# Patient Record
Sex: Male | Born: 1935 | Race: White | Hispanic: No | Marital: Married | Smoking: Former smoker
Health system: Southern US, Community
[De-identification: ages and names within clinical notes are randomized; demographics above are authoritative.]

## PROBLEM LIST (undated history)

## (undated) DIAGNOSIS — I2510 Atherosclerotic heart disease of native coronary artery without angina pectoris: Secondary | ICD-10-CM

## (undated) DIAGNOSIS — K219 Gastro-esophageal reflux disease without esophagitis: Secondary | ICD-10-CM

## (undated) DIAGNOSIS — R972 Elevated prostate specific antigen [PSA]: Secondary | ICD-10-CM

## (undated) DIAGNOSIS — R001 Bradycardia, unspecified: Secondary | ICD-10-CM

## (undated) DIAGNOSIS — I472 Ventricular tachycardia, unspecified: Secondary | ICD-10-CM

## (undated) DIAGNOSIS — I5032 Chronic diastolic (congestive) heart failure: Secondary | ICD-10-CM

## (undated) DIAGNOSIS — E8809 Other disorders of plasma-protein metabolism, not elsewhere classified: Secondary | ICD-10-CM

## (undated) DIAGNOSIS — E119 Type 2 diabetes mellitus without complications: Secondary | ICD-10-CM

## (undated) DIAGNOSIS — I469 Cardiac arrest, cause unspecified: Secondary | ICD-10-CM

## (undated) DIAGNOSIS — J189 Pneumonia, unspecified organism: Secondary | ICD-10-CM

## (undated) DIAGNOSIS — R0902 Hypoxemia: Secondary | ICD-10-CM

## (undated) DIAGNOSIS — I38 Endocarditis, valve unspecified: Secondary | ICD-10-CM

## (undated) DIAGNOSIS — G4733 Obstructive sleep apnea (adult) (pediatric): Secondary | ICD-10-CM

## (undated) DIAGNOSIS — C3490 Malignant neoplasm of unspecified part of unspecified bronchus or lung: Secondary | ICD-10-CM

## (undated) DIAGNOSIS — K449 Diaphragmatic hernia without obstruction or gangrene: Secondary | ICD-10-CM

## (undated) DIAGNOSIS — N189 Chronic kidney disease, unspecified: Secondary | ICD-10-CM

## (undated) DIAGNOSIS — B49 Unspecified mycosis: Secondary | ICD-10-CM

## (undated) DIAGNOSIS — I495 Sick sinus syndrome: Secondary | ICD-10-CM

## (undated) DIAGNOSIS — F32A Depression, unspecified: Secondary | ICD-10-CM

## (undated) DIAGNOSIS — F329 Major depressive disorder, single episode, unspecified: Secondary | ICD-10-CM

## (undated) DIAGNOSIS — K222 Esophageal obstruction: Secondary | ICD-10-CM

## (undated) DIAGNOSIS — F1010 Alcohol abuse, uncomplicated: Secondary | ICD-10-CM

## (undated) DIAGNOSIS — K2210 Ulcer of esophagus without bleeding: Secondary | ICD-10-CM

## (undated) DIAGNOSIS — J449 Chronic obstructive pulmonary disease, unspecified: Secondary | ICD-10-CM

## (undated) DIAGNOSIS — R042 Hemoptysis: Secondary | ICD-10-CM

## (undated) DIAGNOSIS — I480 Paroxysmal atrial fibrillation: Secondary | ICD-10-CM

## (undated) DIAGNOSIS — I4729 Other ventricular tachycardia: Secondary | ICD-10-CM

## (undated) DIAGNOSIS — I10 Essential (primary) hypertension: Secondary | ICD-10-CM

## (undated) DIAGNOSIS — C44311 Basal cell carcinoma of skin of nose: Secondary | ICD-10-CM

## (undated) HISTORY — PX: OTHER SURGICAL HISTORY: SHX169

## (undated) HISTORY — DX: Major depressive disorder, single episode, unspecified: F32.9

## (undated) HISTORY — DX: Chronic kidney disease, unspecified: N18.9

## (undated) HISTORY — DX: Esophageal obstruction: K22.2

## (undated) HISTORY — DX: Chronic obstructive pulmonary disease, unspecified: J44.9

## (undated) HISTORY — DX: Hypoxemia: R09.02

## (undated) HISTORY — PX: KNEE ARTHROSCOPY: SHX127

## (undated) HISTORY — DX: Elevated prostate specific antigen (PSA): R97.20

## (undated) HISTORY — DX: Gastro-esophageal reflux disease without esophagitis: K21.9

## (undated) HISTORY — PX: TONSILLECTOMY: SUR1361

## (undated) HISTORY — DX: Obstructive sleep apnea (adult) (pediatric): G47.33

## (undated) HISTORY — DX: Depression, unspecified: F32.A

## (undated) HISTORY — DX: Basal cell carcinoma of skin of nose: C44.311

## (undated) HISTORY — DX: Essential (primary) hypertension: I10

## (undated) HISTORY — DX: Type 2 diabetes mellitus without complications: E11.9

## (undated) HISTORY — PX: CARDIAC CATHETERIZATION: SHX172

## (undated) HISTORY — PX: HERNIA REPAIR: SHX51

## (undated) HISTORY — PX: LUNG LOBECTOMY: SHX167

---

## 1997-06-28 ENCOUNTER — Inpatient Hospital Stay (HOSPITAL_COMMUNITY): Admission: EM | Admit: 1997-06-28 | Discharge: 1997-07-02 | Payer: Self-pay | Admitting: Emergency Medicine

## 1997-07-05 ENCOUNTER — Ambulatory Visit (HOSPITAL_BASED_OUTPATIENT_CLINIC_OR_DEPARTMENT_OTHER): Admission: RE | Admit: 1997-07-05 | Discharge: 1997-07-05 | Payer: Self-pay | Admitting: Orthopedic Surgery

## 1997-08-10 ENCOUNTER — Other Ambulatory Visit: Admission: RE | Admit: 1997-08-10 | Discharge: 1997-08-10 | Payer: Self-pay | Admitting: Orthopedic Surgery

## 2000-10-27 ENCOUNTER — Encounter: Admission: RE | Admit: 2000-10-27 | Discharge: 2000-10-27 | Payer: Self-pay

## 2000-12-22 ENCOUNTER — Ambulatory Visit (HOSPITAL_BASED_OUTPATIENT_CLINIC_OR_DEPARTMENT_OTHER): Admission: RE | Admit: 2000-12-22 | Discharge: 2000-12-22 | Payer: Self-pay

## 2001-01-09 ENCOUNTER — Encounter: Admission: RE | Admit: 2001-01-09 | Discharge: 2001-01-09 | Payer: Self-pay

## 2001-03-26 ENCOUNTER — Observation Stay (HOSPITAL_COMMUNITY): Admission: RE | Admit: 2001-03-26 | Discharge: 2001-03-27 | Payer: Self-pay | Admitting: Urology

## 2001-06-10 ENCOUNTER — Ambulatory Visit (HOSPITAL_COMMUNITY): Admission: RE | Admit: 2001-06-10 | Discharge: 2001-06-10 | Payer: Self-pay | Admitting: Orthopedic Surgery

## 2004-03-07 ENCOUNTER — Ambulatory Visit: Payer: Self-pay | Admitting: Oncology

## 2004-09-12 ENCOUNTER — Encounter: Admission: RE | Admit: 2004-09-12 | Discharge: 2004-09-12 | Payer: Self-pay | Admitting: Family Medicine

## 2004-09-26 ENCOUNTER — Ambulatory Visit: Payer: Self-pay | Admitting: Oncology

## 2004-09-26 ENCOUNTER — Encounter: Admission: RE | Admit: 2004-09-26 | Discharge: 2004-09-26 | Payer: Self-pay | Admitting: Family Medicine

## 2004-09-27 ENCOUNTER — Ambulatory Visit: Payer: Self-pay | Admitting: Oncology

## 2004-09-28 ENCOUNTER — Encounter: Admission: RE | Admit: 2004-09-28 | Discharge: 2004-09-28 | Payer: Self-pay | Admitting: Oncology

## 2004-10-01 ENCOUNTER — Ambulatory Visit (HOSPITAL_COMMUNITY): Admission: RE | Admit: 2004-10-01 | Discharge: 2004-10-01 | Payer: Self-pay | Admitting: Oncology

## 2004-10-02 ENCOUNTER — Ambulatory Visit: Payer: Self-pay | Admitting: Internal Medicine

## 2004-10-03 ENCOUNTER — Ambulatory Visit: Payer: Self-pay | Admitting: Internal Medicine

## 2004-10-23 ENCOUNTER — Ambulatory Visit (HOSPITAL_COMMUNITY): Admission: RE | Admit: 2004-10-23 | Discharge: 2004-10-23 | Payer: Self-pay | Admitting: Thoracic Surgery

## 2004-10-23 ENCOUNTER — Encounter (INDEPENDENT_AMBULATORY_CARE_PROVIDER_SITE_OTHER): Payer: Self-pay | Admitting: *Deleted

## 2004-10-26 ENCOUNTER — Inpatient Hospital Stay (HOSPITAL_BASED_OUTPATIENT_CLINIC_OR_DEPARTMENT_OTHER): Admission: RE | Admit: 2004-10-26 | Discharge: 2004-10-26 | Payer: Self-pay | Admitting: *Deleted

## 2004-10-30 ENCOUNTER — Encounter (INDEPENDENT_AMBULATORY_CARE_PROVIDER_SITE_OTHER): Payer: Self-pay | Admitting: *Deleted

## 2004-10-30 ENCOUNTER — Inpatient Hospital Stay (HOSPITAL_COMMUNITY): Admission: RE | Admit: 2004-10-30 | Discharge: 2004-11-11 | Payer: Self-pay | Admitting: Thoracic Surgery

## 2004-10-30 ENCOUNTER — Ambulatory Visit: Payer: Self-pay | Admitting: Emergency Medicine

## 2004-11-06 ENCOUNTER — Ambulatory Visit: Payer: Self-pay | Admitting: Oncology

## 2004-11-20 ENCOUNTER — Encounter: Admission: RE | Admit: 2004-11-20 | Discharge: 2004-11-20 | Payer: Self-pay | Admitting: Thoracic Surgery

## 2004-11-22 ENCOUNTER — Ambulatory Visit: Payer: Self-pay | Admitting: Oncology

## 2004-11-30 ENCOUNTER — Ambulatory Visit (HOSPITAL_COMMUNITY): Admission: RE | Admit: 2004-11-30 | Discharge: 2004-11-30 | Payer: Self-pay | Admitting: Thoracic Surgery

## 2004-12-04 ENCOUNTER — Encounter: Admission: RE | Admit: 2004-12-04 | Discharge: 2004-12-04 | Payer: Self-pay | Admitting: Thoracic Surgery

## 2004-12-06 ENCOUNTER — Ambulatory Visit: Payer: Self-pay | Admitting: Internal Medicine

## 2004-12-07 ENCOUNTER — Ambulatory Visit: Payer: Self-pay | Admitting: Internal Medicine

## 2004-12-07 ENCOUNTER — Ambulatory Visit (HOSPITAL_COMMUNITY): Admission: RE | Admit: 2004-12-07 | Discharge: 2004-12-07 | Payer: Self-pay | Admitting: Internal Medicine

## 2004-12-07 ENCOUNTER — Encounter (INDEPENDENT_AMBULATORY_CARE_PROVIDER_SITE_OTHER): Payer: Self-pay | Admitting: *Deleted

## 2004-12-07 DIAGNOSIS — K222 Esophageal obstruction: Secondary | ICD-10-CM

## 2004-12-07 HISTORY — DX: Esophageal obstruction: K22.2

## 2004-12-26 ENCOUNTER — Ambulatory Visit: Payer: Self-pay | Admitting: Internal Medicine

## 2004-12-26 ENCOUNTER — Encounter: Admission: RE | Admit: 2004-12-26 | Discharge: 2004-12-26 | Payer: Self-pay | Admitting: Thoracic Surgery

## 2005-01-07 ENCOUNTER — Ambulatory Visit: Payer: Self-pay | Admitting: Oncology

## 2005-01-14 ENCOUNTER — Ambulatory Visit: Payer: Self-pay | Admitting: Pulmonary Disease

## 2005-02-26 ENCOUNTER — Encounter: Admission: RE | Admit: 2005-02-26 | Discharge: 2005-02-26 | Payer: Self-pay | Admitting: Thoracic Surgery

## 2005-02-26 ENCOUNTER — Ambulatory Visit: Payer: Self-pay | Admitting: Pulmonary Disease

## 2005-03-14 ENCOUNTER — Ambulatory Visit (HOSPITAL_COMMUNITY): Admission: RE | Admit: 2005-03-14 | Discharge: 2005-03-14 | Payer: Self-pay | Admitting: Oncology

## 2005-03-15 ENCOUNTER — Ambulatory Visit: Payer: Self-pay | Admitting: Oncology

## 2005-04-23 ENCOUNTER — Ambulatory Visit: Payer: Self-pay | Admitting: Pulmonary Disease

## 2005-05-31 ENCOUNTER — Ambulatory Visit: Payer: Self-pay | Admitting: Oncology

## 2005-06-05 ENCOUNTER — Ambulatory Visit (HOSPITAL_COMMUNITY): Admission: RE | Admit: 2005-06-05 | Discharge: 2005-06-05 | Payer: Self-pay | Admitting: Oncology

## 2005-07-17 ENCOUNTER — Encounter: Admission: RE | Admit: 2005-07-17 | Discharge: 2005-07-17 | Payer: Self-pay | Admitting: Thoracic Surgery

## 2005-07-31 ENCOUNTER — Ambulatory Visit: Payer: Self-pay | Admitting: Pulmonary Disease

## 2005-09-11 ENCOUNTER — Ambulatory Visit: Payer: Self-pay | Admitting: Pulmonary Disease

## 2005-11-20 ENCOUNTER — Encounter: Admission: RE | Admit: 2005-11-20 | Discharge: 2005-11-20 | Payer: Self-pay | Admitting: Thoracic Surgery

## 2005-11-29 ENCOUNTER — Ambulatory Visit: Payer: Self-pay | Admitting: Oncology

## 2005-12-02 LAB — CBC WITH DIFFERENTIAL (CANCER CENTER ONLY)
Eosinophils Absolute: 0.2 10*3/uL (ref 0.0–0.5)
HCT: 39.5 % (ref 38.7–49.9)
LYMPH#: 3.3 10*3/uL (ref 0.9–3.3)
LYMPH%: 23.1 % (ref 14.0–48.0)
MCV: 92 fL (ref 82–98)
MONO#: 1.2 10*3/uL — ABNORMAL HIGH (ref 0.1–0.9)
NEUT%: 66.8 % (ref 40.0–80.0)
RBC: 4.29 10*6/uL (ref 4.20–5.70)
WBC: 14.3 10*3/uL — ABNORMAL HIGH (ref 4.0–10.0)

## 2005-12-03 LAB — COMPREHENSIVE METABOLIC PANEL
CO2: 22 mEq/L (ref 19–32)
Calcium: 9.5 mg/dL (ref 8.4–10.5)
Chloride: 96 mEq/L (ref 96–112)
Creatinine, Ser: 1.3 mg/dL (ref 0.40–1.50)
Glucose, Bld: 169 mg/dL — ABNORMAL HIGH (ref 70–99)
Total Bilirubin: 0.6 mg/dL (ref 0.3–1.2)
Total Protein: 7.1 g/dL (ref 6.0–8.3)

## 2005-12-04 ENCOUNTER — Ambulatory Visit (HOSPITAL_COMMUNITY): Admission: RE | Admit: 2005-12-04 | Discharge: 2005-12-04 | Payer: Self-pay | Admitting: Oncology

## 2005-12-23 ENCOUNTER — Ambulatory Visit: Payer: Self-pay | Admitting: Pulmonary Disease

## 2006-03-26 ENCOUNTER — Ambulatory Visit: Payer: Self-pay | Admitting: Oncology

## 2006-04-01 LAB — COMPREHENSIVE METABOLIC PANEL
Albumin: 4.1 g/dL (ref 3.5–5.2)
CO2: 21 mEq/L (ref 19–32)
Calcium: 9.1 mg/dL (ref 8.4–10.5)
Glucose, Bld: 181 mg/dL — ABNORMAL HIGH (ref 70–99)
Potassium: 4.6 mEq/L (ref 3.5–5.3)
Sodium: 134 mEq/L — ABNORMAL LOW (ref 135–145)
Total Bilirubin: 0.6 mg/dL (ref 0.3–1.2)
Total Protein: 6.6 g/dL (ref 6.0–8.3)

## 2006-04-01 LAB — CBC WITH DIFFERENTIAL (CANCER CENTER ONLY)
BASO#: 0.1 10*3/uL (ref 0.0–0.2)
BASO%: 0.7 % (ref 0.0–2.0)
HCT: 39.9 % (ref 38.7–49.9)
HGB: 13.6 g/dL (ref 13.0–17.1)
LYMPH#: 2.3 10*3/uL (ref 0.9–3.3)
LYMPH%: 17.4 % (ref 14.0–48.0)
MCHC: 34.2 g/dL (ref 32.0–35.9)
MCV: 94 fL (ref 82–98)
MONO#: 1.1 10*3/uL — ABNORMAL HIGH (ref 0.1–0.9)
NEUT%: 72.7 % (ref 40.0–80.0)
RDW: 12.2 % (ref 10.5–14.6)
WBC: 13.2 10*3/uL — ABNORMAL HIGH (ref 4.0–10.0)

## 2006-04-01 LAB — LACTATE DEHYDROGENASE: LDH: 150 U/L (ref 94–250)

## 2006-04-08 ENCOUNTER — Ambulatory Visit (HOSPITAL_COMMUNITY): Admission: RE | Admit: 2006-04-08 | Discharge: 2006-04-08 | Payer: Self-pay | Admitting: Oncology

## 2006-05-15 ENCOUNTER — Ambulatory Visit: Payer: Self-pay | Admitting: Pulmonary Disease

## 2006-05-21 ENCOUNTER — Encounter: Admission: RE | Admit: 2006-05-21 | Discharge: 2006-05-21 | Payer: Self-pay | Admitting: Thoracic Surgery

## 2006-05-21 ENCOUNTER — Ambulatory Visit: Payer: Self-pay | Admitting: Thoracic Surgery

## 2006-06-23 ENCOUNTER — Ambulatory Visit: Payer: Self-pay | Admitting: Internal Medicine

## 2006-06-30 ENCOUNTER — Encounter (INDEPENDENT_AMBULATORY_CARE_PROVIDER_SITE_OTHER): Payer: Self-pay | Admitting: *Deleted

## 2006-06-30 ENCOUNTER — Ambulatory Visit: Payer: Self-pay | Admitting: Internal Medicine

## 2006-07-16 ENCOUNTER — Ambulatory Visit: Payer: Self-pay | Admitting: Pulmonary Disease

## 2006-08-04 ENCOUNTER — Ambulatory Visit: Payer: Self-pay | Admitting: Oncology

## 2006-08-05 LAB — CBC WITH DIFFERENTIAL (CANCER CENTER ONLY)
BASO#: 0.1 10*3/uL (ref 0.0–0.2)
BASO%: 0.4 % (ref 0.0–2.0)
EOS%: 0.8 % (ref 0.0–7.0)
Eosinophils Absolute: 0.1 10*3/uL (ref 0.0–0.5)
HCT: 39.2 % (ref 38.7–49.9)
HGB: 13.3 g/dL (ref 13.0–17.1)
LYMPH#: 1.5 10*3/uL (ref 0.9–3.3)
LYMPH%: 11.9 % — ABNORMAL LOW (ref 14.0–48.0)
MCH: 31.2 pg (ref 28.0–33.4)
MCHC: 33.9 g/dL (ref 32.0–35.9)
MCV: 92 fL (ref 82–98)
MONO#: 0.6 10*3/uL (ref 0.1–0.9)
MONO%: 4.5 % (ref 0.0–13.0)
NEUT#: 10.2 10*3/uL — ABNORMAL HIGH (ref 1.5–6.5)
NEUT%: 82.4 % — ABNORMAL HIGH (ref 40.0–80.0)
Platelets: 362 10*3/uL (ref 145–400)
RBC: 4.26 10*6/uL (ref 4.20–5.70)
RDW: 13.5 % (ref 10.5–14.6)
WBC: 12.3 10*3/uL — ABNORMAL HIGH (ref 4.0–10.0)

## 2006-08-05 LAB — COMPREHENSIVE METABOLIC PANEL
ALT: 26 U/L (ref 0–53)
Alkaline Phosphatase: 72 U/L (ref 39–117)
Sodium: 130 mEq/L — ABNORMAL LOW (ref 135–145)
Total Bilirubin: 1 mg/dL (ref 0.3–1.2)
Total Protein: 6.2 g/dL (ref 6.0–8.3)

## 2006-08-07 ENCOUNTER — Ambulatory Visit (HOSPITAL_COMMUNITY): Admission: RE | Admit: 2006-08-07 | Discharge: 2006-08-07 | Payer: Self-pay | Admitting: Oncology

## 2006-10-03 ENCOUNTER — Ambulatory Visit: Payer: Self-pay | Admitting: Oncology

## 2006-10-15 ENCOUNTER — Ambulatory Visit: Payer: Self-pay | Admitting: Emergency Medicine

## 2006-11-19 ENCOUNTER — Encounter: Admission: RE | Admit: 2006-11-19 | Discharge: 2006-11-19 | Payer: Self-pay | Admitting: Thoracic Surgery

## 2006-11-19 ENCOUNTER — Ambulatory Visit: Payer: Self-pay | Admitting: Thoracic Surgery

## 2006-12-01 ENCOUNTER — Ambulatory Visit: Payer: Self-pay | Admitting: Oncology

## 2006-12-11 ENCOUNTER — Inpatient Hospital Stay (HOSPITAL_COMMUNITY): Admission: EM | Admit: 2006-12-11 | Discharge: 2006-12-29 | Payer: Self-pay | Admitting: Emergency Medicine

## 2006-12-11 ENCOUNTER — Ambulatory Visit: Payer: Self-pay | Admitting: Pulmonary Disease

## 2006-12-12 ENCOUNTER — Encounter (INDEPENDENT_AMBULATORY_CARE_PROVIDER_SITE_OTHER): Payer: Self-pay | Admitting: *Deleted

## 2007-01-30 ENCOUNTER — Ambulatory Visit: Payer: Self-pay | Admitting: Emergency Medicine

## 2007-02-02 ENCOUNTER — Ambulatory Visit: Payer: Self-pay | Admitting: Oncology

## 2007-02-03 LAB — CBC WITH DIFFERENTIAL (CANCER CENTER ONLY)
BASO#: 0.1 10*3/uL (ref 0.0–0.2)
BASO%: 0.9 % (ref 0.0–2.0)
EOS%: 2.1 % (ref 0.0–7.0)
HGB: 10.7 g/dL — ABNORMAL LOW (ref 13.0–17.1)
LYMPH#: 6 10*3/uL — ABNORMAL HIGH (ref 0.9–3.3)
MCH: 29.5 pg (ref 28.0–33.4)
MCHC: 33.8 g/dL (ref 32.0–35.9)
MONO%: 10.5 % (ref 0.0–13.0)
NEUT#: 5.9 10*3/uL (ref 1.5–6.5)
Platelets: 488 10*3/uL — ABNORMAL HIGH (ref 145–400)
RDW: 13.7 % (ref 10.5–14.6)

## 2007-02-03 LAB — COMPREHENSIVE METABOLIC PANEL
CO2: 23 mEq/L (ref 19–32)
Calcium: 8.6 mg/dL (ref 8.4–10.5)
Creatinine, Ser: 1.09 mg/dL (ref 0.40–1.50)
Glucose, Bld: 139 mg/dL — ABNORMAL HIGH (ref 70–99)
Total Bilirubin: 0.2 mg/dL — ABNORMAL LOW (ref 0.3–1.2)
Total Protein: 6.2 g/dL (ref 6.0–8.3)

## 2007-02-03 LAB — LACTATE DEHYDROGENASE: LDH: 153 U/L (ref 94–250)

## 2007-02-05 ENCOUNTER — Ambulatory Visit (HOSPITAL_COMMUNITY): Admission: RE | Admit: 2007-02-05 | Discharge: 2007-02-05 | Payer: Self-pay | Admitting: Oncology

## 2007-02-10 LAB — IRON AND TIBC
%SAT: 7 % — ABNORMAL LOW (ref 20–55)
Iron: 30 ug/dL — ABNORMAL LOW (ref 42–165)
TIBC: 443 ug/dL — ABNORMAL HIGH (ref 215–435)

## 2007-02-17 ENCOUNTER — Telehealth (INDEPENDENT_AMBULATORY_CARE_PROVIDER_SITE_OTHER): Payer: Self-pay | Admitting: *Deleted

## 2007-02-18 ENCOUNTER — Ambulatory Visit: Payer: Self-pay | Admitting: Thoracic Surgery

## 2007-03-04 ENCOUNTER — Ambulatory Visit: Payer: Self-pay | Admitting: Emergency Medicine

## 2007-03-04 DIAGNOSIS — C3490 Malignant neoplasm of unspecified part of unspecified bronchus or lung: Secondary | ICD-10-CM | POA: Insufficient documentation

## 2007-03-04 DIAGNOSIS — R0902 Hypoxemia: Secondary | ICD-10-CM | POA: Insufficient documentation

## 2007-03-04 DIAGNOSIS — K219 Gastro-esophageal reflux disease without esophagitis: Secondary | ICD-10-CM

## 2007-03-04 DIAGNOSIS — I2510 Atherosclerotic heart disease of native coronary artery without angina pectoris: Secondary | ICD-10-CM | POA: Insufficient documentation

## 2007-03-04 DIAGNOSIS — J449 Chronic obstructive pulmonary disease, unspecified: Secondary | ICD-10-CM

## 2007-03-04 DIAGNOSIS — J4489 Other specified chronic obstructive pulmonary disease: Secondary | ICD-10-CM | POA: Insufficient documentation

## 2007-03-04 DIAGNOSIS — G4710 Hypersomnia, unspecified: Secondary | ICD-10-CM | POA: Insufficient documentation

## 2007-03-04 DIAGNOSIS — G4730 Sleep apnea, unspecified: Secondary | ICD-10-CM

## 2007-03-19 DIAGNOSIS — I469 Cardiac arrest, cause unspecified: Secondary | ICD-10-CM

## 2007-03-19 HISTORY — DX: Cardiac arrest, cause unspecified: I46.9

## 2007-04-14 ENCOUNTER — Telehealth (INDEPENDENT_AMBULATORY_CARE_PROVIDER_SITE_OTHER): Payer: Self-pay | Admitting: *Deleted

## 2007-05-11 ENCOUNTER — Telehealth (INDEPENDENT_AMBULATORY_CARE_PROVIDER_SITE_OTHER): Payer: Self-pay | Admitting: *Deleted

## 2007-06-15 ENCOUNTER — Telehealth (INDEPENDENT_AMBULATORY_CARE_PROVIDER_SITE_OTHER): Payer: Self-pay | Admitting: *Deleted

## 2007-08-04 ENCOUNTER — Ambulatory Visit: Payer: Self-pay | Admitting: Oncology

## 2007-08-07 ENCOUNTER — Ambulatory Visit (HOSPITAL_COMMUNITY): Admission: RE | Admit: 2007-08-07 | Discharge: 2007-08-07 | Payer: Self-pay | Admitting: Oncology

## 2007-08-11 ENCOUNTER — Ambulatory Visit: Payer: Self-pay | Admitting: Thoracic Surgery

## 2007-08-11 ENCOUNTER — Encounter: Payer: Self-pay | Admitting: Internal Medicine

## 2007-09-04 ENCOUNTER — Ambulatory Visit: Payer: Self-pay | Admitting: Emergency Medicine

## 2007-12-18 ENCOUNTER — Ambulatory Visit: Payer: Self-pay | Admitting: Emergency Medicine

## 2008-02-04 ENCOUNTER — Ambulatory Visit: Payer: Self-pay | Admitting: Oncology

## 2008-02-09 LAB — CBC WITH DIFFERENTIAL (CANCER CENTER ONLY)
BASO%: 1.1 % (ref 0.0–2.0)
Eosinophils Absolute: 0.3 10*3/uL (ref 0.0–0.5)
LYMPH#: 4.4 10*3/uL — ABNORMAL HIGH (ref 0.9–3.3)
MONO#: 0.9 10*3/uL (ref 0.1–0.9)
NEUT#: 7.5 10*3/uL — ABNORMAL HIGH (ref 1.5–6.5)
Platelets: 350 10*3/uL (ref 145–400)
RBC: 4.24 10*6/uL (ref 4.20–5.70)
WBC: 13.3 10*3/uL — ABNORMAL HIGH (ref 4.0–10.0)

## 2008-02-09 LAB — CMP (CANCER CENTER ONLY)
ALT(SGPT): 20 U/L (ref 10–47)
AST: 28 U/L (ref 11–38)
Creat: 1.5 mg/dl — ABNORMAL HIGH (ref 0.6–1.2)
Total Bilirubin: 0.6 mg/dl (ref 0.20–1.60)

## 2008-02-10 ENCOUNTER — Ambulatory Visit (HOSPITAL_COMMUNITY): Admission: RE | Admit: 2008-02-10 | Discharge: 2008-02-10 | Payer: Self-pay | Admitting: Oncology

## 2008-02-15 ENCOUNTER — Encounter: Payer: Self-pay | Admitting: Internal Medicine

## 2008-02-17 ENCOUNTER — Ambulatory Visit: Payer: Self-pay | Admitting: Thoracic Surgery

## 2008-04-04 ENCOUNTER — Telehealth (INDEPENDENT_AMBULATORY_CARE_PROVIDER_SITE_OTHER): Payer: Self-pay | Admitting: *Deleted

## 2008-04-11 ENCOUNTER — Encounter: Admission: RE | Admit: 2008-04-11 | Discharge: 2008-04-11 | Payer: Self-pay | Admitting: *Deleted

## 2008-04-13 ENCOUNTER — Telehealth (INDEPENDENT_AMBULATORY_CARE_PROVIDER_SITE_OTHER): Payer: Self-pay | Admitting: *Deleted

## 2008-04-26 ENCOUNTER — Telehealth (INDEPENDENT_AMBULATORY_CARE_PROVIDER_SITE_OTHER): Payer: Self-pay | Admitting: *Deleted

## 2008-05-03 ENCOUNTER — Ambulatory Visit: Payer: Self-pay | Admitting: Emergency Medicine

## 2008-08-09 ENCOUNTER — Ambulatory Visit: Payer: Self-pay | Admitting: Oncology

## 2008-08-10 ENCOUNTER — Encounter: Admission: RE | Admit: 2008-08-10 | Discharge: 2008-08-10 | Payer: Self-pay | Admitting: Oncology

## 2008-08-12 LAB — CMP (CANCER CENTER ONLY)
ALT(SGPT): 23 U/L (ref 10–47)
Albumin: 3.4 g/dL (ref 3.3–5.5)
CO2: 30 mEq/L (ref 18–33)
Calcium: 9.1 mg/dL (ref 8.0–10.3)
Chloride: 97 mEq/L — ABNORMAL LOW (ref 98–108)
Potassium: 4.2 mEq/L (ref 3.3–4.7)
Sodium: 146 mEq/L — ABNORMAL HIGH (ref 128–145)
Total Protein: 7 g/dL (ref 6.4–8.1)

## 2008-08-12 LAB — CBC WITH DIFFERENTIAL (CANCER CENTER ONLY)
BASO%: 0.8 % (ref 0.0–2.0)
EOS%: 1.8 % (ref 0.0–7.0)
Eosinophils Absolute: 0.3 10*3/uL (ref 0.0–0.5)
MCH: 32.9 pg (ref 28.0–33.4)
MCHC: 35.6 g/dL (ref 32.0–35.9)
MONO%: 7.6 % (ref 0.0–13.0)
NEUT#: 8.7 10*3/uL — ABNORMAL HIGH (ref 1.5–6.5)
Platelets: 348 10*3/uL (ref 145–400)
RBC: 4.16 10*6/uL — ABNORMAL LOW (ref 4.20–5.70)
RDW: 12.3 % (ref 10.5–14.6)

## 2008-08-12 LAB — LACTATE DEHYDROGENASE: LDH: 132 U/L (ref 94–250)

## 2008-08-16 ENCOUNTER — Ambulatory Visit: Payer: Self-pay | Admitting: Thoracic Surgery

## 2008-08-16 ENCOUNTER — Encounter: Payer: Self-pay | Admitting: Internal Medicine

## 2008-11-11 ENCOUNTER — Ambulatory Visit: Payer: Self-pay | Admitting: Emergency Medicine

## 2008-11-28 ENCOUNTER — Telehealth: Payer: Self-pay | Admitting: Emergency Medicine

## 2008-12-28 ENCOUNTER — Telehealth (INDEPENDENT_AMBULATORY_CARE_PROVIDER_SITE_OTHER): Payer: Self-pay | Admitting: *Deleted

## 2008-12-31 ENCOUNTER — Encounter: Payer: Self-pay | Admitting: Emergency Medicine

## 2009-01-02 ENCOUNTER — Telehealth: Payer: Self-pay | Admitting: Emergency Medicine

## 2009-01-11 ENCOUNTER — Ambulatory Visit: Payer: Self-pay | Admitting: Oncology

## 2009-02-02 ENCOUNTER — Ambulatory Visit: Payer: Self-pay | Admitting: Cardiovascular Disease

## 2009-02-02 ENCOUNTER — Encounter: Payer: Self-pay | Admitting: Cardiology

## 2009-02-02 ENCOUNTER — Inpatient Hospital Stay (HOSPITAL_COMMUNITY): Admission: EM | Admit: 2009-02-02 | Discharge: 2009-02-09 | Payer: Self-pay | Admitting: Emergency Medicine

## 2009-02-02 ENCOUNTER — Encounter: Payer: Self-pay | Admitting: Emergency Medicine

## 2009-02-06 ENCOUNTER — Ambulatory Visit: Payer: Self-pay | Admitting: Oncology

## 2009-02-13 LAB — LACTATE DEHYDROGENASE: LDH: 196 U/L (ref 94–250)

## 2009-02-13 LAB — CBC WITH DIFFERENTIAL (CANCER CENTER ONLY)
BASO#: 0.1 10*3/uL (ref 0.0–0.2)
EOS%: 2.2 % (ref 0.0–7.0)
HGB: 12.7 g/dL — ABNORMAL LOW (ref 13.0–17.1)
LYMPH%: 26.6 % (ref 14.0–48.0)
MCH: 33.3 pg (ref 28.0–33.4)
MCHC: 33.9 g/dL (ref 32.0–35.9)
MONO%: 6.6 % (ref 0.0–13.0)
NEUT#: 6.8 10*3/uL — ABNORMAL HIGH (ref 1.5–6.5)
Platelets: 254 10*3/uL (ref 145–400)

## 2009-02-13 LAB — CMP (CANCER CENTER ONLY)
ALT(SGPT): 27 U/L (ref 10–47)
CO2: 28 mEq/L (ref 18–33)
Creat: 1.6 mg/dl — ABNORMAL HIGH (ref 0.6–1.2)
Total Bilirubin: 0.6 mg/dl (ref 0.20–1.60)

## 2009-02-14 ENCOUNTER — Ambulatory Visit (HOSPITAL_COMMUNITY): Admission: RE | Admit: 2009-02-14 | Discharge: 2009-02-14 | Payer: Self-pay | Admitting: Oncology

## 2009-02-15 ENCOUNTER — Encounter: Payer: Self-pay | Admitting: Internal Medicine

## 2009-03-01 ENCOUNTER — Ambulatory Visit: Payer: Self-pay | Admitting: Thoracic Surgery

## 2009-03-18 ENCOUNTER — Telehealth: Payer: Self-pay | Admitting: Internal Medicine

## 2009-03-24 ENCOUNTER — Ambulatory Visit: Payer: Self-pay | Admitting: Emergency Medicine

## 2009-04-05 ENCOUNTER — Ambulatory Visit: Payer: Self-pay | Admitting: Emergency Medicine

## 2009-05-11 ENCOUNTER — Ambulatory Visit: Payer: Self-pay | Admitting: Oncology

## 2009-06-14 ENCOUNTER — Ambulatory Visit: Payer: Self-pay | Admitting: Oncology

## 2009-07-05 ENCOUNTER — Telehealth: Payer: Self-pay | Admitting: Emergency Medicine

## 2009-07-25 ENCOUNTER — Ambulatory Visit: Payer: Self-pay | Admitting: Emergency Medicine

## 2009-09-13 ENCOUNTER — Telehealth: Payer: Self-pay | Admitting: Emergency Medicine

## 2009-10-06 ENCOUNTER — Ambulatory Visit: Payer: Self-pay | Admitting: Oncology

## 2009-10-11 LAB — CBC WITH DIFFERENTIAL (CANCER CENTER ONLY)
BASO#: 0.1 10*3/uL (ref 0.0–0.2)
EOS%: 0.8 % (ref 0.0–7.0)
HGB: 12.5 g/dL — ABNORMAL LOW (ref 13.0–17.1)
LYMPH#: 3 10*3/uL (ref 0.9–3.3)
MCHC: 35 g/dL (ref 32.0–35.9)
NEUT#: 10.8 10*3/uL — ABNORMAL HIGH (ref 1.5–6.5)
RBC: 3.93 10*6/uL — ABNORMAL LOW (ref 4.20–5.70)

## 2009-10-11 LAB — CMP (CANCER CENTER ONLY)
ALT(SGPT): 22 U/L (ref 10–47)
AST: 28 U/L (ref 11–38)
Albumin: 4.1 g/dL (ref 3.3–5.5)
BUN, Bld: 28 mg/dL — ABNORMAL HIGH (ref 7–22)
Calcium: 9.6 mg/dL (ref 8.0–10.3)
Chloride: 95 mEq/L — ABNORMAL LOW (ref 98–108)
Potassium: 4.6 mEq/L (ref 3.3–4.7)
Total Protein: 6.8 g/dL (ref 6.4–8.1)

## 2009-10-13 ENCOUNTER — Ambulatory Visit (HOSPITAL_COMMUNITY): Admission: RE | Admit: 2009-10-13 | Discharge: 2009-10-13 | Payer: Self-pay | Admitting: Oncology

## 2009-10-17 ENCOUNTER — Encounter: Payer: Self-pay | Admitting: Internal Medicine

## 2009-10-17 ENCOUNTER — Ambulatory Visit: Payer: Self-pay | Admitting: Thoracic Surgery

## 2009-10-19 ENCOUNTER — Ambulatory Visit: Payer: Self-pay | Admitting: Cardiology

## 2009-10-25 ENCOUNTER — Ambulatory Visit: Payer: Self-pay | Admitting: Emergency Medicine

## 2009-12-02 ENCOUNTER — Encounter: Payer: Self-pay | Admitting: Internal Medicine

## 2009-12-06 ENCOUNTER — Ambulatory Visit: Payer: Self-pay | Admitting: Cardiology

## 2010-01-22 ENCOUNTER — Telehealth (INDEPENDENT_AMBULATORY_CARE_PROVIDER_SITE_OTHER): Payer: Self-pay | Admitting: *Deleted

## 2010-03-08 ENCOUNTER — Ambulatory Visit: Payer: Self-pay | Admitting: Internal Medicine

## 2010-03-28 ENCOUNTER — Encounter (INDEPENDENT_AMBULATORY_CARE_PROVIDER_SITE_OTHER): Payer: Self-pay | Admitting: *Deleted

## 2010-04-07 ENCOUNTER — Other Ambulatory Visit (HOSPITAL_COMMUNITY): Payer: Self-pay | Admitting: Oncology

## 2010-04-07 ENCOUNTER — Encounter: Payer: Self-pay | Admitting: Thoracic Surgery

## 2010-04-07 DIAGNOSIS — C3490 Malignant neoplasm of unspecified part of unspecified bronchus or lung: Secondary | ICD-10-CM

## 2010-04-08 ENCOUNTER — Encounter: Payer: Self-pay | Admitting: Thoracic Surgery

## 2010-04-08 ENCOUNTER — Encounter: Payer: Self-pay | Admitting: Oncology

## 2010-04-09 ENCOUNTER — Encounter: Payer: Self-pay | Admitting: Thoracic Surgery

## 2010-04-17 NOTE — Progress Notes (Signed)
Summary: samples  Phone Note Call from Patient Call back at Home Phone [phone]   Caller: Patient Call For: [name] Stevenson Reason for Call: Talk to Nurse Summary of Call: can pt get samples of - Spiriva, symbicort to last until appt in August? Initial call taken by: Eugene Gavia,  September 13, 2009 1:20 PM  Follow-up for Phone Call        called pt--no answer and no machine to leave a message--will try back ---i have sample of 1 spiriva nd 1 symbicort up front for him.  i can send a refill to his pharmacy if he needs this.   will try back later. Randell Loop [facility]  September 13, 2009 1:29 PM   Additional Follow-up for Phone Call Additional follow up Details #1::        called and spoke with pts wife and she is aware of samples up front to pick up and requested that refills of symbicort and spiriva be sent to the pharmacy.  she is aware that refills have been sent. Randell Loop CMA  September 13, 2009 1:51 PM     New/Updated Medications: SYMBICORT 160-4.5 MCG/ACT AERO (BUDESONIDE-FORMOTEROL FUMARATE) inhale one puff two times a day --rinse mouth after each use Prescriptions: SPIRIVA HANDIHALER 18 MCG  CAPS (TIOTROPIUM BROMIDE MONOHYDRATE) Inhale contents of 1 capsule once a day  #30 x 3   Entered by:   Randell Loop CMA   Authorized by:   Leslye Peer MD   Signed by:   Randell Loop CMA on 09/13/2009   Method used:   Electronically to        [facility]* (retail)       [address]       [place], [place]  [address]       Ph: [phone]       Fax: [phone]   RxID:   [id number] SYMBICORT 160-4.5 MCG/ACT AERO (BUDESONIDE-FORMOTEROL FUMARATE) inhale one puff two times a day --rinse mouth after each use  #1 x 3   Entered by:   Randell Loop CMA   Authorized by:   Leslye Peer MD   Signed by:   Randell Loop CMA on 09/13/2009   Method used:   Electronically to        [facility]* (retail)       [address]       [place], [place]  [address]       Ph: [phone]       Fax:  [phone]   RxID:   [id number]

## 2010-04-17 NOTE — Letter (Signed)
Summary: [facility]  [facility]   Imported By: Sherian Rein 03/23/2009 15:12:03  _____________________________________________________________________  External Attachment:    Type:   Image     Comment:   External Document

## 2010-04-17 NOTE — Assessment & Plan Note (Signed)
Summary: COPD, OSA, UA irritation   Visit Type:  Follow-up Primary Provider/Referring Provider:  Jamal Maes  CC:  COPD.  OSA.  The patient c/o sob with exertion.  He says he wears his CPAP every night and is having no problems with it.Marland Kitchen  History of Present Illness: 77 yo man, with severe COPD and chronic hypoxemia.  He also has a history of non-small cell lung cancer, status post right upper lobectomy and adjuvant chemotherapy  in 2006.   Following with Dr Welton Flakes and Edwyna Shell in Sept 10. Wears CPAP every night. Provided by [employer], suposedly entitled to a new one.   Breathing fairly well, doesn't like the humid weather. No new problems, no wheeze, no cough. No exacerbation since last visit.   March 24, 2009--Presents for an acute office visit. Complains of prod cough with white mucus, increased SOB, wheezing, low grade temp x1 week.  finished 5 day course amoxicilin 500mg  bid yesterday. This did not work. Now has lots of coughing with thick mucus. Finish Amox. last night.  Denies chest pain,  orthopnea, hemoptysis, fever, n/v/d, edema, headache.   ROV 04/05/09 -- Pt returns for f/u. He was seen by TP 1/7 for an exacerbation in setting URI. He has completed avelox and prednisone. He is doing better, but still with clear nasal drainage and dry cough. Has not required frequent ProAir. Getting back to his usual activity.   Current Medications (verified): 1)  Lisinopril 40 Mg  Tabs (Lisinopril) .... Take 1 Tablet By Mouth Once A Day 2)  Metoprolol Succinate 50 Mg  Tb24 (Metoprolol Succinate) .... Take 1 Tablet By Mouth Once A Day 3)  Lipitor 40 Mg  Tabs (Atorvastatin Calcium) .... Take 1 Tablet By Mouth Once A Day 4)  Spiriva Handihaler 18 Mcg  Caps (Tiotropium Bromide Monohydrate) .... Inhale Contents of 1 Capsule Once A Day 5)  Furosemide 20 Mg Tabs (Furosemide) .... Take 1 Tablet By Mouth Once A Day 6)  Nexium 40 Mg  Cpdr (Esomeprazole Magnesium) .... Take 1 Capsule By Mouth Once A  Day 7)  Colchicine 0.6 Mg  Tabs (Colchicine) .... As Needed 8)  Alprazolam 0.5 Mg  Tabs (Alprazolam) .... As Needed 9)  Hydralazine Hcl 25 Mg  Tabs (Hydralazine Hcl) .... Take 2 Tablets By Mouth Once Daily As Needed 10)  Proair Hfa 108 (90 Base) Mcg/act  Aers (Albuterol Sulfate) .... Inhale 2 Puffs Every 4 Hrs As Needed 11)  Advair Diskus 250-50 Mcg/dose Misc (Fluticasone-Salmeterol) .... Inhale 1 Puff Two Times A Day 12)  Fish Oil 1000 Mg Caps (Omega-3 Fatty Acids) .... Once Daily 13)  Vitamin E 1000 Unit Caps (Vitamin E) .... Once Daily  Allergies (verified): No Known Drug Allergies  Vital Signs:  Patient profile:   77 year old male Height:      67 inches (170.18 cm) Weight:      218 pounds (99.09 kg) BMI:     34.27 O2 Sat:      96 % on 3 L/min Temp:     98.0 degrees F (36.67 degrees C) oral Pulse rate:   80 / minute BP sitting:   128 / 80  (left arm) Cuff size:   regular  Vitals Entered By: Michel Bickers CMA (April 05, 2009 10:25 AM)  O2 Sat at Rest %:  96 O2 Flow:  3 L/min  Physical Exam  General:  normal appearance, on supplemental oxygen, and obese.   Head:  normocephalic and atraumatic Nose:  no deformity,  discharge, inflammation, or lesions Mouth:  no deformity or lesions Neck:  No deformities, masses, or tenderness noted. Chest Wall:  No deformities, masses, tenderness Lungs:  distant without wheezing in the normal respiratory cycle.  very mild wheeze on forced expiration. there is some referred UA noise on a forced breath Heart:  Normal rate and regular rhythm. S1 and S2 normal without gallop, murmur, click, rub or other extra sounds. Abdomen:  not examined Msk:  no deformity or scoliosis noted with normal posture Extremities:  no edema Neurologic:  non-focal Psych:  alert and cooperative; normal mood and affect; normal attention span and concentration   Impression & Recommendations:  Problem # 1:  CHRONIC OBSTRUCTIVE PULMONARY DISEASE, SEVERE (ICD-496) With  a recent acute exacerbation. Seems to be resolved except for residual upper airway irritation. He has several things that may be may be delaying resolution including lisinopril, Advair, mucinex.  - stop mucinex - hold Advair for 2 days then restart - don't want to stop lisinopril at this time (it has always been well-tolerated), although we may need to do so if his UA irritation persists - ROV in 4 -6 weeks - Spiriva, O2  Problem # 2:  HYPERSOMNIA WITH SLEEP APNEA UNSPECIFIED (ICD-780.53) Tolerating CPAP  Other Orders: Est. Patient Level IV ([address])  Patient Instructions: 1)  Continue your Spiriva once daily  2)  Stop your Advair for 2 days, then restart it 3)  Continue your oxygen at all times 4)  Stop Mucinex. 5)  Continue your CPAP 6)  Follow up with Dr [name] in 4 -6 weeks

## 2010-04-17 NOTE — Assessment & Plan Note (Signed)
Summary: Acute NP office visit - wheezing/SOB   Primary Provider/Referring Provider:  Jamal Maes  CC:  prod cough with white mucus, increased SOB, wheezing, and low grade temp x1 week.  finished 5 day course amoxicilin 500mg  bid yesterday.Marland Kitchen  History of Present Illness: 77 yo man, with severe COPD and chronic hypoxemia.  He also has a history of non-small cell lung cancer, status post right upper lobectomy and adjuvant chemotherapy  in 2006.   Following with Dr Welton Flakes and Edwyna Shell in Sept 10. Wears CPAP every night. Provided by [employer], suposedly entitled to a new one.   Breathing fairly well, doesn't like the humid weather. No new problems, no wheeze, no cough. No exacerbation since last visit.   March 24, 2009--Presents for an acute office visit. Complains of prod cough with white mucus, increased SOB, wheezing, low grade temp x1 week.  finished 5 day course amoxicilin 500mg  bid yesterday. This did not work. Now has lots of coughing with thick mucus. Finish Amox. last night.  Denies chest pain,  orthopnea, hemoptysis, fever, n/v/d, edema, headache.   Medications Prior to Update: 1)  Miralax  Powd (Polyethylene Glycol 3350) .... Take 1 Scoop Dissolved in Water Once A Day 2)  Digoxin 0.125 Mg  Tabs (Digoxin) .... Take 1 Tablet By Mouth Once A Day 3)  Diltiazem Hcl Cr 240 Mg  Cp24 (Diltiazem Hcl) .... Take 1 Capsule By Mouth Once A Day 4)  Lisinopril 40 Mg  Tabs (Lisinopril) .... Take 1 Tablet By Mouth Once A Day 5)  Metoprolol Succinate 50 Mg  Tb24 (Metoprolol Succinate) .... Take 1 Tablet By Mouth Once A Day 6)  Lipitor 40 Mg  Tabs (Atorvastatin Calcium) .... Take 1 Tablet By Mouth Once A Day 7)  Spiriva Handihaler 18 Mcg  Caps (Tiotropium Bromide Monohydrate) .... Inhale Contents of 1 Capsule Once A Day 8)  Lasix 80 Mg Tabs (Furosemide) .... Once Daily 9)  Nexium 40 Mg  Cpdr (Esomeprazole Magnesium) .... Take 1 Capsule By Mouth Once A Day 10)  Colchicine 0.6 Mg  Tabs (Colchicine)  .... As Needed 11)  Alprazolam 0.5 Mg  Tabs (Alprazolam) .... As Needed 12)  Hydralazine Hcl 25 Mg  Tabs (Hydralazine Hcl) .... Take 2 Tablets By Mouth Once Daily As Needed 13)  Proair Hfa 108 (90 Base) Mcg/act  Aers (Albuterol Sulfate) .... Inhale 2 Puffs Every 4 Hrs As Needed 14)  Advair Diskus 250-50 Mcg/dose Misc (Fluticasone-Salmeterol) .... Inhale 1 Puff Two Times A Day 15)  Fish Oil 1000 Mg Caps (Omega-3 Fatty Acids) .... Once Daily 16)  Vitamin E 1000 Unit Caps (Vitamin E) .... Once Daily 17)  Potassium Gluconate 595 Mg Tabs (Potassium Gluconate) .... Once Daily  Current Medications (verified): 1)  Miralax  Powd (Polyethylene Glycol 3350) .... Take 1 Scoop Dissolved in Water Once A Day 2)  Lisinopril 40 Mg  Tabs (Lisinopril) .... Take 1 Tablet By Mouth Once A Day 3)  Metoprolol Succinate 50 Mg  Tb24 (Metoprolol Succinate) .... Take 1 Tablet By Mouth Once A Day 4)  Lipitor 40 Mg  Tabs (Atorvastatin Calcium) .... Take 1 Tablet By Mouth Once A Day 5)  Spiriva Handihaler 18 Mcg  Caps (Tiotropium Bromide Monohydrate) .... Inhale Contents of 1 Capsule Once A Day 6)  Furosemide 20 Mg Tabs (Furosemide) .... Take 1 Tablet By Mouth Once A Day 7)  Nexium 40 Mg  Cpdr (Esomeprazole Magnesium) .... Take 1 Capsule By Mouth Once A Day 8)  Colchicine 0.6  Mg  Tabs (Colchicine) .... As Needed 9)  Alprazolam 0.5 Mg  Tabs (Alprazolam) .... As Needed 10)  Hydralazine Hcl 25 Mg  Tabs (Hydralazine Hcl) .... Take 2 Tablets By Mouth Once Daily As Needed 11)  Proair Hfa 108 (90 Base) Mcg/act  Aers (Albuterol Sulfate) .... Inhale 2 Puffs Every 4 Hrs As Needed 12)  Advair Diskus 250-50 Mcg/dose Misc (Fluticasone-Salmeterol) .... Inhale 1 Puff Two Times A Day 13)  Fish Oil 1000 Mg Caps (Omega-3 Fatty Acids) .... Once Daily 14)  Vitamin E 1000 Unit Caps (Vitamin E) .... Once Daily  Allergies (verified): No Known Drug Allergies  Past History:  Family History: Last updated: 03/24/2009 heart disease -  father rheumatism - mother, father cancer - mother (lung)    Social History: Last updated: 03/24/2009 former smoker, quit 1998.  1.5ppd x10yrs. no alcohol married 3 children retired - [employer] architect.   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  1.5ppd x23yrs. no alcohol married 3 children retired - Naval architect.   Vital Signs:  Patient profile:   77 year old male Height:      67 inches Weight:      219.13 pounds BMI:     34.44 O2 Sat:      97 % on 3L pulsing Temp:     97.4 degrees F oral Pulse rate:   70 / minute BP sitting:   120 / 86  (left arm) Cuff size:   regular  Vitals Entered By: Boone Master CNA (March 24, 2009 3:37 PM)  O2 Flow:  3L pulsing CC: prod cough with white mucus, increased SOB, wheezing, low grade temp x1 week.  finished 5 day course amoxicilin 500mg  bid yesterday. Is Patient Diabetic? No Comments Medications reviewed with patient Daytime contact number verified with patient. Boone Master CNA  March 24, 2009 3:36 PM    Physical Exam  Additional Exam:  GEN: A/Ox3; pleasant , NAD HEENT:  [place]/AT, , EACs-clear, TMs-wnl, NOSE-clear, THROAT-clear NECK:  Supple w/ fair ROM; no JVD; normal carotid impulses w/o bruits; no thyromegaly or nodules palpated; no lymphadenopathy. RESP  Coarse BS w/ faint exp wheeizng CARD:  RRR, no m/r/g   GI:   Soft & nt; nml bowel sounds; no organomegaly or masses detected. [place]co: Warm bil,  no calf tenderness edema, clubbing, pulses intact Neuro: intact w/ no fcoal deficits noted.   Impression &  Recommendations:  Problem # 1:  CHRONIC OBSTRUCTIVE PULMONARY DISEASE, SEVERE (ICD-496)  Exacerbation-slow to resolve REC: Avelox 400mg  once daily for 7 days Mucinex DM two times a day as needed cough/congestion Prednisone taper over next week.  Increase fluids.  Please contact office for sooner follow up if symptoms do not improve or worsen  follow up 2 weeks Dr. [name]  The following medications were removed from the medication list:    Diltiazem Hcl Cr 240 Mg Cp24 (Diltiazem hcl) .Marland Kitchen... Take 1 capsule by mouth once a day His updated medication list for this problem includes:    Lisinopril 40 Mg Tabs (Lisinopril) .Marland Kitchen... Take 1 tablet by mouth once a day    Metoprolol Succinate 50 Mg Tb24 (Metoprolol succinate) .Marland Kitchen... Take 1 tablet by mouth  once a day    Furosemide 20 Mg Tabs (Furosemide) .Marland Kitchen... Take 1 tablet by mouth once a day    Hydralazine Hcl 25 Mg Tabs (Hydralazine hcl) .Marland Kitchen... Take 2 tablets by mouth once daily as needed  Orders: Est. Patient Level IV ([address]284)  Medications Added to Medication List This Visit: 1)  Furosemide 20 Mg Tabs (Furosemide) .... Take 1 tablet by mouth once a day 2)  Avelox 400 Mg Tabs (Moxifloxacin hcl) .Marland Kitchen.. 1 by mouth once daily 3)  Prednisone 10 Mg Tabs (Prednisone) .... 4 tabs for 2 days, then 3 tabs for 2 days, 2 tabs for 2 days, then 1 tab for 2 days, then stop  Complete Medication List: 1)  Miralax Powd (Polyethylene glycol 3350) .... Take 1 scoop dissolved in water once a day 2)  Lisinopril 40 Mg Tabs (Lisinopril) .... Take 1 tablet by mouth once a day 3)  Metoprolol Succinate 50 Mg Tb24 (Metoprolol succinate) .... Take 1 tablet by mouth once a day 4)  Lipitor 40 Mg Tabs (Atorvastatin calcium) .... Take 1 tablet by mouth once a day 5)  Spiriva Handihaler 18 Mcg Caps (Tiotropium bromide monohydrate) .... Inhale contents of 1 capsule once a day 6)  Furosemide 20 Mg Tabs (Furosemide) .... Take 1 tablet by mouth once a day 7)  Nexium 40 Mg Cpdr  (Esomeprazole magnesium) .... Take 1 capsule by mouth once a day 8)  Colchicine 0.6 Mg Tabs (Colchicine) .... As needed 9)  Alprazolam 0.5 Mg Tabs (Alprazolam) .... As needed 10)  Hydralazine Hcl 25 Mg Tabs (Hydralazine hcl) .... Take 2 tablets by mouth once daily as needed 11)  Proair Hfa 108 (90 Base) Mcg/act Aers (Albuterol sulfate) .... Inhale 2 puffs every 4 hrs as needed 12)  Advair Diskus 250-50 Mcg/dose Misc (Fluticasone-salmeterol) .... Inhale 1 puff two times a day 13)  Fish Oil 1000 Mg Caps (Omega-3 fatty acids) .... Once daily 14)  Vitamin E 1000 Unit Caps (Vitamin e) .... Once daily 15)  Avelox 400 Mg Tabs (Moxifloxacin hcl) .Marland Kitchen.. 1 by mouth once daily 16)  Prednisone 10 Mg Tabs (Prednisone) .... 4 tabs for 2 days, then 3 tabs for 2 days, 2 tabs for 2 days, then 1 tab for 2 days, then stop  Patient Instructions: 1)  Avelox 400mg  once daily for 7 days 2)  Mucinex DM two times a day as needed cough/congestion 3)  Prednisone taper over next week.  4)  Increase fluids.  5)  Please contact office for sooner follow up if symptoms do not improve or worsen  6)  follow up 2 weeks Dr. [name]  Prescriptions: PREDNISONE 10 MG TABS (PREDNISONE) 4 tabs for 2 days, then 3 tabs for 2 days, 2 tabs for 2 days, then 1 tab for 2 days, then stop  #20 x 0   Entered and Authorized by:   Rubye Oaks NP   Signed by:   Jarid Sasso NP on 03/24/2009   Method used:   Electronically to        [facility] (retail)       [address]       [place], [place]  [id number]       Ph: [phone]       Fax: [phone]   RxID:   [id number] AVELOX 400 MG TABS (MOXIFLOXACIN HCL) 1 by mouth once daily  #7 x 0   Entered and Authorized by:   Rubye Oaks NP   Signed by:  Nery Kalisz NP on 03/24/2009   Method used:   Electronically to        [facility] (retail)       [address]       [place], [place]  [id number]       Ph: [phone]       Fax:  [phone]   RxID:   [id number]    Immunization History:  Influenza Immunization History:    Influenza:  historical (12/16/2008)  Pneumovax Immunization History:    Pneumovax:  historical (03/19/2003)   Appended Document: Neb Orders Update     Clinical Lists Changes  Orders: Added new Service order of Nebulizer Tx ([id number]) - Signed

## 2010-04-17 NOTE — Assessment & Plan Note (Signed)
Summary: COPD, OSA, hx NSCLCA   Visit Type:  Follow-up Primary Provider/Referring Provider:  Jamal Maes  CC:  COPD.  The patient says he has noticed a slight improvement  in his breathing since starting Symbicort.Marland Kitchen  History of Present Illness: 77 yo man, with severe COPD and chronic hypoxemia.  He also has a history of non-small cell lung cancer, status post right upper lobectomy and adjuvant chemotherapy  in 2006.   Following with Dr Welton Flakes and Edwyna Shell in Sept 10. Wears CPAP every night. Provided by [employer], suposedly entitled to a new one.   March 24, 2009--Presents for an acute office visit. Complains of prod cough with white mucus, increased SOB, wheezing, low grade temp x1 week.  finished 5 day course amoxicilin 500mg  bid yesterday. This did not work. Now has lots of coughing with thick mucus. Finish Amox. last night.  Denies chest pain,  orthopnea, hemoptysis, fever, n/v/d, edema, headache.   ROV 04/05/09 -- Pt returns for f/u. He was seen by TP 1/7 for an exacerbation in setting URI. He has completed avelox and prednisone. He is doing better, but still with clear nasal drainage and dry cough. Has not required frequent ProAir. Getting back to his usual activity.   ROV 07/25/09 -- f/u for COPD, OSA. Last time we tried stopping Advair to see if it helped his throat irritation. he has not restarted it, throat feels better. He may be having more exertional SOB since stopping the Advair. Tolerates Spiriva.   ROV 10/25/09 -- COPD and OSA. Tells me has been feeling well. Last Ct scan showed no evidence recurrance of his lung CA. Breathing has been OK, the heat has limited his activity. Last time we changed Advair to Symbicort - has helped his irritated throat, has also made his breathing a bit better. Never had to stop his Lisinopril. No new problems, no exacerbations.   Current Medications (verified): 1)  Lisinopril 40 Mg  Tabs (Lisinopril) .... Take 1 Tablet By Mouth Once A Day 2)   Metoprolol Succinate 50 Mg  Tb24 (Metoprolol Succinate) .... Take 1 Tablet By Mouth Once A Day 3)  Lipitor 40 Mg  Tabs (Atorvastatin Calcium) .... Take 1 Tablet By Mouth Once A Day 4)  Spiriva Handihaler 18 Mcg  Caps (Tiotropium Bromide Monohydrate) .... Inhale Contents of 1 Capsule Once A Day 5)  Furosemide 20 Mg Tabs (Furosemide) .... Take 1 Tablet By Mouth Once A Day 6)  Nexium 40 Mg  Cpdr (Esomeprazole Magnesium) .... Take 1 Capsule By Mouth Once A Day 7)  Colchicine 0.6 Mg  Tabs (Colchicine) .... As Needed 8)  Alprazolam 0.5 Mg  Tabs (Alprazolam) .... As Needed 9)  Hydralazine Hcl 25 Mg  Tabs (Hydralazine Hcl) .... Take 2 Tablets By Mouth Once Daily As Needed 10)  Proair Hfa 108 (90 Base) Mcg/act  Aers (Albuterol Sulfate) .... Inhale 2 Puffs Every 4 Hrs As Needed 11)  Fish Oil 1000 Mg Caps (Omega-3 Fatty Acids) .... Once Daily 12)  Vitamin E 1000 Unit Caps (Vitamin E) .... Once Daily 13)  Symbicort 160-4.5 Mcg/act Aero (Budesonide-Formoterol Fumarate) .... Inhale One Puff Two Times A Day --Rinse Mouth After Each Use  Allergies (verified): No Known Drug Allergies  Vital Signs:  Patient profile:   77 year old male Height:      67 inches (170.18 cm) Weight:      203 pounds (92.27 kg) BMI:     31.91 O2 Sat:      96 %  on 3 L/min Temp:     98.3 degrees F (36.83 degrees C) oral Pulse rate:   75 / minute BP sitting:   130 / 58  (left arm) Cuff size:   large  Vitals Entered By: Michel Bickers CMA (October 25, 2009 11:10 AM)  O2 Sat at Rest %:  96 O2 Flow:  3 L/min CC: COPD.  The patient says he has noticed a slight improvement  in his breathing since starting Symbicort. Comments Medications reviewed with the patient. Daytime phone verified. Michel Bickers CMA  October 25, 2009 11:11 AM   Physical Exam  General:  normal appearance, on supplemental oxygen, and obese.   Head:  normocephalic and atraumatic Nose:  no deformity, discharge, inflammation, or lesions Mouth:  no deformity or  lesions Neck:  No deformities, masses, or tenderness noted. Chest Wall:  small (<1cm) mobile painless nodule at xiphoid Lungs:  distant without wheezing in the normal respiratory cycle.  Heart:  Normal rate and regular rhythm. S1 and S2 normal without gallop, murmur, click, rub or other extra sounds. Abdomen:  not examined Msk:  no deformity or scoliosis noted with normal posture Extremities:  no edema Neurologic:  non-focal Psych:  alert and cooperative; normal mood and affect; normal attention span and concentration   Impression & Recommendations:  Problem # 1:  CHRONIC OBSTRUCTIVE PULMONARY DISEASE, SEVERE (ICD-496) Has been stable on current regimen, improved with the change to symbicort - same BD's - O2 - rov in 6 mo or as needed   Problem # 2:  CARCINOMA, LUNG, SQUAMOUS CELL (ICD-162.9) Stable by recent CT scan  Other Orders: Est. Patient Level IV ([address])  Patient Instructions: 1)  Continue your Spiriva and Symbicort as you are taking them.  2)  Continue your oxygen at all times.  3)  Follow up with Dr [name] in 6 months or as needed

## 2010-04-17 NOTE — Progress Notes (Signed)
Summary: On call- catching cold- has amox  Phone Note Call from Patient   Summary of Call: On call- 03/17/09- Wife called. He has chest cold, low fever, nonproductive. We agreed to make zpak available on hold. It turned out his cardiologist had already called amoxacillin for same issue. I withdrew Zpak. He can use amox if needed, as discussed. Call office if no better. Initial call taken by: Waymon Budge MD,  March 18, 2009 8:42 PM

## 2010-04-17 NOTE — Assessment & Plan Note (Signed)
Summary: COPD, UA irritation   Visit Type:  Follow-up Primary Provider/Referring Provider:  Jamal Maes  CC:  4 month follow , not using advair at this time , hoarsness is better since he stopped advair, and Abdominal Pain.  History of Present Illness: 77 yo man, with severe COPD and chronic hypoxemia.  He also has a history of non-small cell lung cancer, status post right upper lobectomy and adjuvant chemotherapy  in 2006.   Following with Dr Welton Flakes and Edwyna Shell in Sept 10. Wears CPAP every night. Provided by [employer], suposedly entitled to a new one.   March 24, 2009--Presents for an acute office visit. Complains of prod cough with white mucus, increased SOB, wheezing, low grade temp x1 week.  finished 5 day course amoxicilin 500mg  bid yesterday. This did not work. Now has lots of coughing with thick mucus. Finish Amox. last night.  Denies chest pain,  orthopnea, hemoptysis, fever, n/v/d, edema, headache.   ROV 04/05/09 -- Pt returns for f/u. He was seen by TP 1/7 for an exacerbation in setting URI. He has completed avelox and prednisone. He is doing better, but still with clear nasal drainage and dry cough. Has not required frequent ProAir. Getting back to his usual activity.   ROV 07/25/09 -- f/u for COPD, OSA. Last time we tried stopping Advair to see if it helped his throat irritation. he has not restarted it, throat feels better. He may be having more exertional SOB since stopping the Advair. Tolerates Spiriva.   Dyspepsia History:      There is a prior history of GERD.     Current Medications (verified): 1)  Lisinopril 40 Mg  Tabs (Lisinopril) .... Take 1 Tablet By Mouth Once A Day 2)  Metoprolol Succinate 50 Mg  Tb24 (Metoprolol Succinate) .... Take 1 Tablet By Mouth Once A Day 3)  Lipitor 40 Mg  Tabs (Atorvastatin Calcium) .... Take 1 Tablet By Mouth Once A Day 4)  Spiriva Handihaler 18 Mcg  Caps (Tiotropium Bromide Monohydrate) .... Inhale Contents of 1 Capsule Once A Day 5)   Furosemide 20 Mg Tabs (Furosemide) .... Take 1 Tablet By Mouth Once A Day 6)  Nexium 40 Mg  Cpdr (Esomeprazole Magnesium) .... Take 1 Capsule By Mouth Once A Day 7)  Colchicine 0.6 Mg  Tabs (Colchicine) .... As Needed 8)  Alprazolam 0.5 Mg  Tabs (Alprazolam) .... As Needed 9)  Hydralazine Hcl 25 Mg  Tabs (Hydralazine Hcl) .... Take 2 Tablets By Mouth Once Daily As Needed 10)  Proair Hfa 108 (90 Base) Mcg/act  Aers (Albuterol Sulfate) .... Inhale 2 Puffs Every 4 Hrs As Needed 11)  Advair Diskus 250-50 Mcg/dose Misc (Fluticasone-Salmeterol) .... Inhale 1 Puff Two Times A Day 12)  Fish Oil 1000 Mg Caps (Omega-3 Fatty Acids) .... Once Daily 13)  Vitamin E 1000 Unit Caps (Vitamin E) .... Once Daily  Allergies (verified): No Known Drug Allergies  Vital Signs:  Patient profile:   77 year old male Height:      67 inches Weight:      207 pounds BMI:     32.54 O2 Sat:      96 % on 3 L/min Temp:     98.1 degrees F oral Pulse rate:   75 / minute BP sitting:   158 / 70  (left arm)  Vitals Entered By: Renold Genta RCP, LPN (Jul 25, 2009 11:46 AM)  O2 Sat at Rest %:  96% O2 Flow:  3 L/min CC:  4 month follow , not using advair at this time , hoarsness is better since he stopped advair, Abdominal Pain Comments Medications reviewed with patient Renold Genta RCP, LPN  Jul 25, 2009 11:46 AM    Physical Exam  General:  normal appearance, on supplemental oxygen, and obese.   Head:  normocephalic and atraumatic Nose:  no deformity, discharge, inflammation, or lesions Mouth:  no deformity or lesions Neck:  No deformities, masses, or tenderness noted. Chest Wall:  small (<1cm) mobile painless nodule at xiphoid Lungs:  distant without wheezing in the normal respiratory cycle.  referred UA noise on a forced breath Heart:  Normal rate and regular rhythm. S1 and S2 normal without gallop, murmur, click, rub or other extra sounds. Abdomen:  not examined Msk:  no deformity or scoliosis noted with  normal posture Extremities:  no edema Neurologic:  non-focal Psych:  alert and cooperative; normal mood and affect; normal attention span and concentration   Impression & Recommendations:  Problem # 1:  CHRONIC OBSTRUCTIVE PULMONARY DISEASE, SEVERE (ICD-496) - continue Spiriva - d/c Advair and trial of Symbicort to see if better tolerated (from UA standpoint), helps his SOB - may need to consider changing lisinopril at some point in future, but has always been well-tolerated - O2 - rov 3 months or as needed   Other Orders: Est. Patient Level III (27782)  Patient Instructions: 1)  Continue Spiriva once daily  2)  Continue your oxygen as ordered 3)  Do not restart Advair 4)  Start Symbicort 160/4.54mcg, 2 puffs two times a day for the next 2 weeks. Rinse your mouth after using. If your breathing improves and your throat irritation does not return then we will prescribe this for you.  5)  Follow up with Dr [name] in 3 months or as needed.

## 2010-04-17 NOTE — Progress Notes (Signed)
Summary: samples-  Phone Note Call from Patient Call back at Home Phone [phone]   Caller: [name] Stevenson Call For: [name] Stevenson Reason for Call: Talk to Nurse Summary of Call: pt out of Spiriva.  Real expensive, can he get some samples? Initial call taken by: Eugene Gavia,  July 05, 2009 2:11 PM  Follow-up for Phone Call        ATC to advise samples at front. Line busy [facility]. Carron Curie CMA  July 05, 2009 2:19 PM  ATC line busy, [facility]. Carron Curie CMA  July 05, 2009 2:43 PM  advised samples at front. Carron Curie CMA  July 05, 2009 2:53 PM

## 2010-04-17 NOTE — Miscellaneous (Signed)
Summary: Device preload  Clinical Lists Changes  Observations: Added new observation of PPM INDICATN: Brady (12/02/2009 15:11) Added new observation of MAGNET RTE: BOL 85 ERI 65 (12/02/2009 15:11) Added new observation of PPMLEADSTAT2: active (12/02/2009 15:11) Added new observation of PPMLEADSER2: [id number] (12/02/2009 15:11) Added new observation of PPMLEADMOD2: 5076  (12/02/2009 15:11) Added new observation of PPMLEADDOI2: 02/07/2009  (12/02/2009 15:11) Added new observation of PPMLEADLOC2: RV  (12/02/2009 15:11) Added new observation of PPMLEADSTAT1: active  (12/02/2009 15:11) Added new observation of PPMLEADSER1: [id number]  (12/02/2009 15:11) Added new observation of PPMLEADMOD1: 5076  (12/02/2009 15:11) Added new observation of PPMLEADDOI1: 02/07/2009  (12/02/2009 15:11) Added new observation of PPMLEADLOC1: RA  (12/02/2009 15:11) Added new observation of PPM IMP MD: Duffy Rhody Tennant,MD  (12/02/2009 15:11) Added new observation of PPM DOI: 02/07/2009  (12/02/2009 15:11) Added new observation of PPM SERL#: [id number]  (12/02/2009 15:11) Added new observation of PPM MODL#: [id number]  (12/02/2009 15:11) Added new observation of PACEMAKERMFG: Medtronic  (12/02/2009 15:11) Added new observation of PPM REFER MD: Villa Herb  (12/02/2009 15:11) Added new observation of PACEMAKER MD: Sherryl Manges, MD  (12/02/2009 15:11)      PPM Specifications Following MD:  Sherryl Manges, MD     Referring MD:  Villa Herb PPM Vendor:  Medtronic     PPM Model Number:  [id number]     PPM Serial Number:  [id number] PPM DOI:  02/07/2009     PPM Implanting MD:  Rolla Plate  Lead 1    Location: RA     DOI: 02/07/2009     Model #: [id number]     Serial #: [id number]     Status: active Lead 2    Location: RV     DOI: 02/07/2009     Model #: 4034     Serial #: [id number]     Status: active  Magnet Response Rate:  BOL 85 ERI 65  Indications:  Huston Foley

## 2010-04-17 NOTE — Letter (Signed)
[facility]   Imported By: Sherian Rein 11/03/2009 14:10:09  _____________________________________________________________________  External Attachment:    Type:   Image     Comment:   External Document

## 2010-04-17 NOTE — Progress Notes (Signed)
Summary: samples  Phone Note Call from Patient Call back at Home Phone [phone]   Caller: Spouse-mrs Hable Call For: byrum Summary of Call: requests samples of spiriva and symbicort.  Initial call taken by: Tivis Ringer, CNA,  January 22, 2010 10:40 AM  Follow-up for Phone Call        Spoke with pts wife , pt is in the donote hole left sample of spiriva and symbicort 160, will pick up on Friday 01/26/10 Armita Galloway RCP, LPN  January 22, 2010 10:56 AM

## 2010-04-19 NOTE — Letter (Signed)
Summary: Remote Device Check  [facility], Main Office  [address]   [place], [place] [address]   Phone: [phone]  Fax: [phone]     March 28, 2010 MRN: [id number]   [name] Stevenson [address] [place], [place]  [address]   Dear [name],   Your remote transmission was recieved and reviewed by your physician.  All diagnostics were within normal limits for you.  __X____Your next office visit is scheduled for:   March 2012 with Dr Graciela Husbands.                            Please call our office to schedule an appointment.    Sincerely,  Vella Kohler

## 2010-04-19 NOTE — Cardiovascular Report (Signed)
Summary: Office Visit Remote   Office Visit Remote   Imported By: Roderic Ovens 03/30/2010 11:48:47  _____________________________________________________________________  External Attachment:    Type:   Image     Comment:   External Document

## 2010-05-04 ENCOUNTER — Ambulatory Visit (INDEPENDENT_AMBULATORY_CARE_PROVIDER_SITE_OTHER): Payer: MEDICARE | Admitting: Emergency Medicine

## 2010-05-04 ENCOUNTER — Encounter: Payer: Self-pay | Admitting: Emergency Medicine

## 2010-05-04 DIAGNOSIS — C3490 Malignant neoplasm of unspecified part of unspecified bronchus or lung: Secondary | ICD-10-CM

## 2010-05-04 DIAGNOSIS — G4710 Hypersomnia, unspecified: Secondary | ICD-10-CM

## 2010-05-04 DIAGNOSIS — J449 Chronic obstructive pulmonary disease, unspecified: Secondary | ICD-10-CM

## 2010-05-09 NOTE — Assessment & Plan Note (Signed)
Summary: COPD, OSA, hx NSCLCA   Visit Type:  Follow-up Primary Provider/Referring Provider:  Jamal Maes  CC:  6 month follow up, sob with exertion, denies cough would like samples of symbicort, and taking 1 puff once a day.  History of Present Illness: 77 yo man, with severe COPD and chronic hypoxemia.  He also has a history of non-small cell lung cancer, status post right upper lobectomy and adjuvant chemotherapy in 2006.   Following with Dr Welton Flakes and Edwyna Shell in Sept 10. Wears CPAP every night. Provided by [employer], suposedly entitled to a new one.   ROV 04/05/09 -- Pt returns for f/u. He was seen by TP 1/7 for an exacerbation in setting URI. He has completed avelox and prednisone. He is doing better, but still with clear nasal drainage and dry cough. Has not required frequent ProAir. Getting back to his usual activity.  ROV 07/25/09 -- f/u for COPD, OSA. Last time we tried stopping Advair to see if it helped his throat irritation. he has not restarted it, throat feels better. He may be having more exertional SOB since stopping the Advair. Tolerates Spiriva.   ROV 10/25/09 -- COPD and OSA. Tells me has been feeling well. Last Ct scan showed no evidence recurrance of his lung CA. Breathing has been OK, the heat has limited his activity. Last time we changed Advair to Symbicort - has helped his irritated throat, has also made his breathing a bit better. Never had to stop his Lisinopril. No new problems, no exacerbations.   ROV 05/04/10 -- Hx COPD and OSA, also hx NSCLCA and RUL lobectomy. Reports that he was treated for AE-COPD with abx and pred. Has been on Spiriva and Symbicort, cut back on the symbicort due to cost. Has been using once daily instead of two times a day. Had the pneumovax 7 yrs ago, shouldn't tneed it again. Having CT scans of the chest followed by Dr Welton Flakes, annually. Wears CPAP every night.   Preventive Screening-Counseling & Management  Alcohol-Tobacco     Packs/Day: 1.5    Year Started: 1948     Year Quit: 1998  Current Medications (verified): 1)  Lisinopril 40 Mg  Tabs (Lisinopril) .... Take 1 Tablet By Mouth Once A Day 2)  Metoprolol Succinate 50 Mg  Tb24 (Metoprolol Succinate) .... Take 1 Tablet By Mouth Once A Day 3)  Lipitor 40 Mg  Tabs (Atorvastatin Calcium) .... Take 1 Tablet By Mouth Once A Day 4)  Spiriva Handihaler 18 Mcg  Caps (Tiotropium Bromide Monohydrate) .... Inhale Contents of 1 Capsule Once A Day 5)  Furosemide 20 Mg Tabs (Furosemide) .... Take 1 Tablet By Mouth Once A Day 6)  Nexium 40 Mg  Cpdr (Esomeprazole Magnesium) .... Take 1 Capsule By Mouth Once A Day 7)  Colchicine 0.6 Mg  Tabs (Colchicine) .... As Needed 8)  Alprazolam 0.5 Mg  Tabs (Alprazolam) .... As Needed 9)  Hydralazine Hcl 25 Mg  Tabs (Hydralazine Hcl) .... Take 2 Tablets By Mouth Once Daily As Needed 10)  Proair Hfa 108 (90 Base) Mcg/act  Aers (Albuterol Sulfate) .... Inhale 2 Puffs Every 4 Hrs As Needed 11)  Fish Oil 1000 Mg Caps (Omega-3 Fatty Acids) .... Once Daily 12)  Vitamin E 1000 Unit Caps (Vitamin E) .... Once Daily 13)  Symbicort 160-4.5 Mcg/act Aero (Budesonide-Formoterol Fumarate) .... Inhale One Puff Two Times A Day --Rinse Mouth After Each Use 14)  B Complex-B12  Tabs (B Complex Vitamins) .Marland Kitchen.. 1 Once Daily  Allergies (verified): No Known Drug Allergies  Social History: Packs/Day:  1.5  Vital Signs:  Patient profile:   77 year old male Height:      67 inches Weight:      203.4 pounds BMI:     31.97 O2 Sat:      96 % on 3 L/min Temp:     98.2 degrees F oral Pulse rate:   76 / minute BP sitting:   140 / 80  (left arm)  Vitals Entered By: Kandice Hams CMA (May 04, 2010 11:46 AM)  O2 Flow:  3 L/min CC: 6 month follow up, sob with exertion,denies cough would like samples of symbicort, taking 1 puff once a day Comments Medications reviewed with patient Renold Genta RCP, LPN  May 04, 2010 11:53 AM    Physical Exam  General:  normal  appearance, on supplemental oxygen, and obese.   Head:  normocephalic and atraumatic Nose:  no deformity, discharge, inflammation, or lesions Mouth:  no deformity or lesions Neck:  No deformities, masses, or tenderness noted. Chest Wall:  small (<1cm) mobile painless nodule at xiphoid Lungs:  distant without wheezing in the normal respiratory cycle.  Heart:  Normal rate and regular rhythm. S1 and S2 normal without gallop, murmur, click, rub or other extra sounds. Abdomen:  not examined Msk:  no deformity or scoliosis noted with normal posture Extremities:  no edema Neurologic:  non-focal Psych:  alert and cooperative; normal mood and affect; normal attention span and concentration   Impression & Recommendations:  Problem # 1:  CHRONIC OBSTRUCTIVE PULMONARY DISEASE, SEVERE (ICD-496)  Problem # 2:  HYPERSOMNIA WITH SLEEP APNEA UNSPECIFIED (ICD-780.53)  Orders: Est. Patient Level IV (04540)  Problem # 3:  CARCINOMA, LUNG, SQUAMOUS CELL (ICD-162.9)  Medications Added to Medication List This Visit: 1)  B Complex-b12 Tabs (B complex vitamins) .Marland Kitchen.. 1 once daily  Patient Instructions: 1)  Please continue your CPAP every night 2)  Continue your Spiriva and Symbicort. We willl give you samples to help pay for the Symbicort 3)  Wear your oxygen at all times.  4)  Use ProAir as needed  5)  Follow up with Dr [name] in 6 months or as needed  6)  You do not need to get the pneumovax again. You do need the flu shot every Fall.  7)  Follow with Dr Welton Flakes for your CT scan as planned.

## 2010-06-13 ENCOUNTER — Telehealth: Payer: Self-pay | Admitting: Cardiology

## 2010-06-13 NOTE — Telephone Encounter (Signed)
Scheduled appointments

## 2010-06-13 NOTE — Telephone Encounter (Signed)
Wife, Violet called stating she thinks [name] Stevenson is due for office visit and labs.  Wants you to see what he needs and give them a call.  Ok to leave a message with appt information.

## 2010-06-20 LAB — PROTIME-INR
INR: 1 (ref 0.00–1.49)
INR: 1.03 (ref 0.00–1.49)
INR: 1.04 (ref 0.00–1.49)
INR: 1.09 (ref 0.00–1.49)
Prothrombin Time: 13.1 seconds (ref 11.6–15.2)
Prothrombin Time: 13.4 seconds (ref 11.6–15.2)
Prothrombin Time: 13.5 seconds (ref 11.6–15.2)
Prothrombin Time: 13.8 seconds (ref 11.6–15.2)

## 2010-06-20 LAB — URINALYSIS, ROUTINE W REFLEX MICROSCOPIC
Hgb urine dipstick: NEGATIVE
Specific Gravity, Urine: 1.013 (ref 1.005–1.030)
Urobilinogen, UA: 0.2 mg/dL (ref 0.0–1.0)

## 2010-06-20 LAB — CARDIAC PANEL(CRET KIN+CKTOT+MB+TROPI)
CK, MB: 1.4 ng/mL (ref 0.3–4.0)
Relative Index: INVALID (ref 0.0–2.5)
Total CK: 60 U/L (ref 7–232)

## 2010-06-20 LAB — BASIC METABOLIC PANEL
BUN: 21 mg/dL (ref 6–23)
BUN: 28 mg/dL — ABNORMAL HIGH (ref 6–23)
CO2: 24 mEq/L (ref 19–32)
CO2: 24 mEq/L (ref 19–32)
CO2: 26 mEq/L (ref 19–32)
Calcium: 8.6 mg/dL (ref 8.4–10.5)
Calcium: 8.9 mg/dL (ref 8.4–10.5)
Chloride: 104 mEq/L (ref 96–112)
Chloride: 106 mEq/L (ref 96–112)
Chloride: 108 mEq/L (ref 96–112)
Chloride: 109 mEq/L (ref 96–112)
Creatinine, Ser: 1.63 mg/dL — ABNORMAL HIGH (ref 0.4–1.5)
Creatinine, Ser: 1.88 mg/dL — ABNORMAL HIGH (ref 0.4–1.5)
GFR calc Af Amer: 43 mL/min — ABNORMAL LOW (ref >60)
GFR calc Af Amer: 48 mL/min — ABNORMAL LOW (ref >60)
GFR calc Af Amer: 50 mL/min — ABNORMAL LOW (ref >60)
GFR calc non Af Amer: 39 mL/min — ABNORMAL LOW (ref >60)
Glucose, Bld: 94 mg/dL (ref 70–99)
Glucose, Bld: 99 mg/dL (ref 70–99)
Potassium: 3.7 mEq/L (ref 3.5–5.1)
Potassium: 4.8 mEq/L (ref 3.5–5.1)
Potassium: 5.2 mEq/L — ABNORMAL HIGH (ref 3.5–5.1)
Sodium: 136 mEq/L (ref 135–145)
Sodium: 139 mEq/L (ref 135–145)

## 2010-06-20 LAB — COMPREHENSIVE METABOLIC PANEL
ALT: 23 U/L (ref 0–53)
ALT: 30 U/L (ref 0–53)
AST: 21 U/L (ref 0–37)
AST: 38 U/L — ABNORMAL HIGH (ref 0–37)
Albumin: 2.7 g/dL — ABNORMAL LOW (ref 3.5–5.2)
Alkaline Phosphatase: 68 U/L (ref 39–117)
Alkaline Phosphatase: 89 U/L (ref 39–117)
BUN: 34 mg/dL — ABNORMAL HIGH (ref 6–23)
CO2: 22 mEq/L (ref 19–32)
CO2: 27 mEq/L (ref 19–32)
Calcium: 8.3 mg/dL — ABNORMAL LOW (ref 8.4–10.5)
Calcium: 8.6 mg/dL (ref 8.4–10.5)
Chloride: 103 mEq/L (ref 96–112)
Chloride: 107 mEq/L (ref 96–112)
Creatinine, Ser: 2.35 mg/dL — ABNORMAL HIGH (ref 0.4–1.5)
GFR calc Af Amer: 33 mL/min — ABNORMAL LOW (ref >60)
GFR calc Af Amer: 48 mL/min — ABNORMAL LOW (ref >60)
GFR calc non Af Amer: 27 mL/min — ABNORMAL LOW (ref >60)
GFR calc non Af Amer: 39 mL/min — ABNORMAL LOW (ref >60)
Glucose, Bld: 114 mg/dL — ABNORMAL HIGH (ref 70–99)
Glucose, Bld: 96 mg/dL (ref 70–99)
Potassium: 4.6 mEq/L (ref 3.5–5.1)
Potassium: 4.6 mEq/L (ref 3.5–5.1)
Sodium: 136 mEq/L (ref 135–145)
Sodium: 137 mEq/L (ref 135–145)
Total Bilirubin: 0.6 mg/dL (ref 0.3–1.2)
Total Bilirubin: 1.5 mg/dL — ABNORMAL HIGH (ref 0.3–1.2)
Total Protein: 5 g/dL — ABNORMAL LOW (ref 6.0–8.3)

## 2010-06-20 LAB — CBC
HCT: 33.8 % — ABNORMAL LOW (ref 39.0–52.0)
HCT: 35.1 % — ABNORMAL LOW (ref 39.0–52.0)
HCT: 38.7 % — ABNORMAL LOW (ref 39.0–52.0)
HCT: 38.8 % — ABNORMAL LOW (ref 39.0–52.0)
Hemoglobin: 11.6 g/dL — ABNORMAL LOW (ref 13.0–17.0)
Hemoglobin: 12.2 g/dL — ABNORMAL LOW (ref 13.0–17.0)
Hemoglobin: 12.6 g/dL — ABNORMAL LOW (ref 13.0–17.0)
Hemoglobin: 13.4 g/dL (ref 13.0–17.0)
MCHC: 34.4 g/dL (ref 30.0–36.0)
MCHC: 34.7 g/dL (ref 30.0–36.0)
MCV: 100 fL (ref 78.0–100.0)
MCV: 100.3 fL — ABNORMAL HIGH (ref 78.0–100.0)
MCV: 99.4 fL (ref 78.0–100.0)
Platelets: 206 10*3/uL (ref 150–400)
Platelets: 210 10*3/uL (ref 150–400)
Platelets: 241 10*3/uL (ref 150–400)
RBC: 3.37 MIL/uL — ABNORMAL LOW (ref 4.22–5.81)
RBC: 3.66 MIL/uL — ABNORMAL LOW (ref 4.22–5.81)
RDW: 13.1 % (ref 11.5–15.5)
RDW: 13.2 % (ref 11.5–15.5)
RDW: 13.2 % (ref 11.5–15.5)
RDW: 13.3 % (ref 11.5–15.5)
WBC: 13 10*3/uL — ABNORMAL HIGH (ref 4.0–10.5)
WBC: 13.3 10*3/uL — ABNORMAL HIGH (ref 4.0–10.5)
WBC: 14.4 10*3/uL — ABNORMAL HIGH (ref 4.0–10.5)
WBC: 14.5 10*3/uL — ABNORMAL HIGH (ref 4.0–10.5)

## 2010-06-20 LAB — WOUND CULTURE

## 2010-06-20 LAB — DIGOXIN LEVEL: Digoxin Level: <0.2 ng/mL — ABNORMAL LOW (ref 0.8–2.0)

## 2010-06-20 LAB — CK TOTAL AND CKMB (NOT AT ARMC)
Relative Index: INVALID (ref 0.0–2.5)
Total CK: 93 U/L (ref 7–232)

## 2010-06-20 LAB — APTT: aPTT: 27 seconds (ref 24–37)

## 2010-06-20 LAB — POCT CARDIAC MARKERS: CKMB, poc: 1.4 ng/mL (ref 1.0–8.0)

## 2010-06-20 LAB — DIFFERENTIAL
Basophils Relative: 0 % (ref 0–1)
Eosinophils Absolute: 0.2 10*3/uL (ref 0.0–0.7)
Eosinophils Relative: 1 % (ref 0–5)
Lymphs Abs: 3.3 10*3/uL (ref 0.7–4.0)
Neutrophils Relative %: 64 % (ref 43–77)

## 2010-06-20 LAB — MAGNESIUM: Magnesium: 2.2 mg/dL (ref 1.5–2.5)

## 2010-06-20 LAB — TSH: TSH: 1.405 u[IU]/mL (ref 0.350–4.500)

## 2010-06-20 LAB — URINE MICROSCOPIC-ADD ON

## 2010-06-26 ENCOUNTER — Other Ambulatory Visit (INDEPENDENT_AMBULATORY_CARE_PROVIDER_SITE_OTHER): Payer: MEDICARE | Admitting: *Deleted

## 2010-06-26 DIAGNOSIS — E7800 Pure hypercholesterolemia, unspecified: Secondary | ICD-10-CM

## 2010-06-26 LAB — LIPID PANEL
HDL: 42.1 mg/dL (ref >39.00)
LDL Cholesterol: 60 mg/dL (ref 0–99)
Total CHOL/HDL Ratio: 3
VLDL: 9.4 mg/dL (ref 0.0–40.0)

## 2010-06-26 LAB — BASIC METABOLIC PANEL
Calcium: 9.1 mg/dL (ref 8.4–10.5)
GFR: 38.72 mL/min — ABNORMAL LOW (ref >60.00)
Glucose, Bld: 103 mg/dL — ABNORMAL HIGH (ref 70–99)
Potassium: 5.3 mEq/L — ABNORMAL HIGH (ref 3.5–5.1)
Sodium: 140 mEq/L (ref 135–145)

## 2010-06-26 LAB — HEPATIC FUNCTION PANEL
AST: 30 U/L (ref 0–37)
Albumin: 3.4 g/dL — ABNORMAL LOW (ref 3.5–5.2)
Alkaline Phosphatase: 102 U/L (ref 39–117)
Bilirubin, Direct: 0.1 mg/dL (ref 0.0–0.3)

## 2010-06-28 ENCOUNTER — Encounter: Payer: Self-pay | Admitting: *Deleted

## 2010-06-29 ENCOUNTER — Encounter: Payer: Self-pay | Admitting: Cardiology

## 2010-06-29 ENCOUNTER — Ambulatory Visit (INDEPENDENT_AMBULATORY_CARE_PROVIDER_SITE_OTHER): Payer: MEDICARE | Admitting: Cardiology

## 2010-06-29 DIAGNOSIS — I2510 Atherosclerotic heart disease of native coronary artery without angina pectoris: Secondary | ICD-10-CM

## 2010-06-29 DIAGNOSIS — I119 Hypertensive heart disease without heart failure: Secondary | ICD-10-CM | POA: Insufficient documentation

## 2010-06-29 DIAGNOSIS — E7800 Pure hypercholesterolemia, unspecified: Secondary | ICD-10-CM

## 2010-06-29 DIAGNOSIS — R7989 Other specified abnormal findings of blood chemistry: Secondary | ICD-10-CM

## 2010-06-29 DIAGNOSIS — Z950 Presence of cardiac pacemaker: Secondary | ICD-10-CM

## 2010-06-29 DIAGNOSIS — E790 Hyperuricemia without signs of inflammatory arthritis and tophaceous disease: Secondary | ICD-10-CM | POA: Insufficient documentation

## 2010-06-29 NOTE — Assessment & Plan Note (Signed)
The patient has a past history of high blood pressure.  His blood pressure has been remaining normal on his present medications.  His potassium is elevated and we are reducing his potassium from twice a day to once a day.

## 2010-06-29 NOTE — Assessment & Plan Note (Signed)
This patient has a history of tachybradycardia syndrome and has a functioning dual-chamber pacemaker.  It was implanted on 02/07/09 by Dr. Deborah Chalk.  He has had a Medtronic in rhythm model T1501 DR.  The patient has felt well since his pacemaker insertion.  He's not having any dizziness or syncope.  He denies any recent chest pain or shortness of breath.  He is on round-the-clock oxygen because of his COPD but his dyspnea is no worse than usual

## 2010-06-29 NOTE — Progress Notes (Signed)
Henderson Newcomer Date of Birth:  12-02-1932 [facility] / [facility] [address]   [address] [place], [place]  [address] [phone]           Fax   [phone] Henderson Newcomer Date of Birth:  1932/08/27 [facility] / [facility] [address]   [address] [place], [place]  [address] [phone]           Fax   [phone]  HPI: This 77 year old gentleman is seen for a scheduled followup office visit.  He has a past history of tachybradycardia syndrome and he has a functioning dual-chamber pacemaker.  He has not yet had his pacemaker checked at the main [facility] pacemaker clinic.  We will try to get him set up for this.  The patient denies any recent palpitations or tachycardia or dizziness.  He is not having any syncopal episodes.  He has not had any recent chest pain or angina.  Current Outpatient Prescriptions  Medication Sig Dispense Refill  . ALBUTEROL IN Inhale into the lungs as needed.        . ALPRAZolam (XANAX) 0.5 MG tablet Take 0.5 mg by mouth every 12 (twelve) hours as needed.        Marland Kitchen aspirin 81 MG tablet Take 81 mg by mouth daily.        Marland Kitchen atorvastatin (LIPITOR) 40 MG tablet Take 40 mg by mouth daily.        . budesonide-formoterol (SYMBICORT) 160-4.5 MCG/ACT inhaler Inhale 2 puffs into the lungs 2 (two) times daily.        . colchicine 0.6 MG tablet Take 0.6 mg by mouth as needed.        . diltiazem (TIAZAC) 240 MG 24 hr capsule Take 240 mg by mouth daily.        Marland Kitchen esomeprazole (NEXIUM) 40 MG capsule Take 40 mg by mouth daily before breakfast.        . ezetimibe (ZETIA) 10 MG tablet Take 10 mg by mouth daily.        . fish oil-omega-3 fatty acids 1000 MG capsule Take 2 g by mouth daily.        . furosemide (LASIX) 40 MG tablet Take 40 mg by mouth 2 (two) times daily. 20 daily       . hydrALAZINE (APRESOLINE) 25 MG tablet Take 25 mg by mouth 2 (two) times daily.       Marland Kitchen HYDROcodone-acetaminophen (VICODIN) 5-500 MG per tablet Take 1 tablet by  mouth every 6 (six) hours as needed.        Jodelle Green Aspart-Potassium Aspart 250-250 MG TABS Take 1 tablet by mouth 2 (two) times daily.        . Magnesium Oxide (DIASENSE MAGNESIUM) 400 (241.3 MG) MG TABS Take by mouth.        . metFORMIN (GLUCOPHAGE) 500 MG tablet Take 1 tablet by mouth Daily.      . metoprolol tartrate (LOPRESSOR) 12.5 mg TABS Take 50 mg by mouth 2 (two) times daily.        . Multiple Vitamins-Minerals (EYE VITAMINS) TABS Take 1 tablet by mouth 2 (two) times daily.        . multivitamin (THERAGRAN) per tablet Take 1 tablet by mouth daily.        . Naproxen Sodium (ALEVE PO) Take by mouth 2 (two) times daily.        . Probiotic Product (RESTORA PO) Take by mouth at bedtime.        Marland Kitchen  Tiotropium Bromide Monohydrate (SPIRIVA HANDIHALER IN) Inhale into the lungs.        . vitamin B-12 (CYANOCOBALAMIN) 100 MCG tablet Take 50 mcg by mouth daily.        . vitamin E 600 UNIT capsule Take 600 Units by mouth 2 (two) times daily.        . Fluticasone-Salmeterol (ADVAIR DISKUS) 500-50 MCG/DOSE AEPB Inhale 1 puff into the lungs every 12 (twelve) hours.          No Known Allergies  Patient Active Problem List  Diagnoses  . CARCINOMA, LUNG, SQUAMOUS CELL  . CORONARY ARTERY DISEASE  . CHRONIC OBSTRUCTIVE PULMONARY DISEASE, SEVERE  . GERD  . HYPERSOMNIA WITH SLEEP APNEA UNSPECIFIED  . HYPOXEMIA  . Pacemaker  . Hypercholesterolemia  . Hyperuricemia  . Benign hypertensive heart disease without heart failure    History  Smoking status  . Former Smoker -- 1.5 packs/day for 50 years  . Types: Cigarettes  . Quit date: 06/28/1996  Smokeless tobacco  . Current User  . Types: Chew    History  Alcohol Use: Not on file    No family history on file.  Review of Systems: The patient denies any heat or cold intolerance.  No weight gain or weight loss.  The patient denies headaches or blurry vision.  There is no cough or sputum production.  The patient denies dizziness.  There is no  hematuria or hematochezia.  The patient denies any muscle aches or arthritis.  The patient denies any rash.  The patient denies frequent falling or instability.  There is no history of depression or anxiety.  All other systems were reviewed and are negative.   Physical Exam: Filed Vitals:   06/29/10 1009  BP: 126/80  Pulse: 70  The general appearance reveals a well-developed well-nourished gentleman who is on oxygen.  He is not having any symptoms of dyspnea.Pupils equal and reactive.   Extraocular Movements are full.  There is no scleral icterus.  The mouth and pharynx are normal.  The neck is supple.  The carotids reveal no bruits.  The jugular venous pressure is normal.  The thyroid is not enlarged.  There is no lymphadenopathy.  The chest reveals increased AP diameter with mild wheezing.  Heart reveals quiet heart tones without murmur gallop rub or click.The abdomen is soft and nontender. Bowel sounds are normal. The liver and spleen are not enlarged. There Are no abdominal masses. There are no bruits.The pedal pulses are good.  There is no phlebitis or edema.  There is no cyanosis or clubbing.Strength is normal and symmetrical in all extremities.  There is no lateralizing weakness.  There are no sensory deficits.    Assessment / Plan: Continue same medication except reduce potassium to once a day.  We will try to get him an appointment at the pacemaker clinic for long-term followup of his dual-chamber Medtronic pacemaker.  Recheck here for office visit and lab work in 4 months.

## 2010-06-29 NOTE — Assessment & Plan Note (Signed)
The patient denies any recent chest painOr use of sublingual nitroglycerin.  He does not have any significant coronary disease.  He had cardiac catheterization by Dr. Reyes Ivan on weight/01/2005 which showed nonobstructive coronary artery disease and an ejection fraction of 45%.

## 2010-07-09 ENCOUNTER — Ambulatory Visit (INDEPENDENT_AMBULATORY_CARE_PROVIDER_SITE_OTHER): Payer: Medicare Other | Admitting: *Deleted

## 2010-07-09 DIAGNOSIS — I498 Other specified cardiac arrhythmias: Secondary | ICD-10-CM

## 2010-07-09 DIAGNOSIS — Z950 Presence of cardiac pacemaker: Secondary | ICD-10-CM

## 2010-07-09 NOTE — Progress Notes (Signed)
Pacer check

## 2010-07-25 ENCOUNTER — Telehealth: Payer: Self-pay | Admitting: Emergency Medicine

## 2010-07-25 NOTE — Telephone Encounter (Signed)
Gave wife 1 sample of spiriva and 1 sample of symb 160. Nothing further was needed

## 2010-07-31 NOTE — Assessment & Plan Note (Signed)
OFFICE VISIT   WAYBURN, SHALER  DOB:  1932-12-28                                        August 16, 2008  CHART #:  [id number]   The patient returned today.  Her blood pressure was 147/76, pulse 61,  respirations 18, and sats were 96% on oxygen.  Chest x-ray was clear.  It has been 4 years since we did his right middle and right lower  lobectomy.  I will see him again in 6 months when he sees Dr. Welton Flakes.   Ines Bloomer, [name]  Electronically Signed   DPB/MEDQ  D:  08/16/2008  T:  08/16/2008  Job:  [id number]

## 2010-07-31 NOTE — Letter (Signed)
February 18, 2007   Drue Second, MD  [address] [place], [place] [address]   Re:  WILLIES, LAVIOLETTE                DOB:  [birth year]   Dear Konrad Dolores,   I saw Mr. Statz and looked at his CT scan.  I agree there is that  precarinal node that is slightly enlarged but with his recent  hospitalization it could be some change secondary to that.  I agree with  you that I would just repeat his CT scan in six months and I discussed  that with he and his wife.  Mr. Fines is still very weak and recovering  from his renal failure.  I plan to see him back again in May.   His blood pressure is 175/85, pulse 85, respirations 22, sats were 93%.   Ines Bloomer, [name]  Electronically Signed   DPB/MEDQ  D:  02/18/2007  T:  02/18/2007  Job:  [id number]

## 2010-07-31 NOTE — Assessment & Plan Note (Signed)
OFFICE VISIT   [name] Stevenson, [name] Stevenson  DOB:  05-Aug-1932                                        November 19, 2006  CHART #:  [id number]   [name] came for followup today. His chest x-ray shows stable  postoperative changes. Blood pressure is 160/97, pulse 87, respirations  18, saturations 96%. He is doing well overall. He will have another CT  scan in November and will see him in December.It is nowover two years  since his surgery.   Ines Bloomer, [name]  Electronically Signed   DPB/MEDQ  D:  11/19/2006  T:  11/20/2006  Job:  [id number]

## 2010-07-31 NOTE — H&P (Signed)
NAMEBERTHA, EARWOOD NO.:  [id number]   MEDICAL RECORD NO.:  [id number]          PATIENT TYPE:  INP   LOCATION:  1824                         FACILITY:  [facility]   PHYSICIAN:  Elmore Guise., M.D.DATE OF BIRTH:  [birth year]   DATE OF ADMISSION:  12/11/2006  DATE OF DISCHARGE:                              HISTORY & PHYSICAL   INDICATION FOR ADMISSION:  Dehydration, hypotension, and acute renal  failure.   HISTORY OF PRESENT ILLNESS:  [name] Stevenson is a 77 year old white male, past  medical history of lung cancer, status post right middle and lower  lobectomy, COPD (O2-dependent), history of alcohol abuse, thrombocytosis  secondary to history of splenectomy, paroxysmal atrial fibrillation,  hypertension, dyslipidemia, history of nonobstructive coronary artery  disease by cardiac catheterization back in August 2006, who presents to  the office for evaluation of malaise, fatigue and increased shortness of  breath.  The patient reports that he has been having difficulty  tolerating oral intake as well as having diffuse diarrhea since Sunday.  He states now he is very lightheaded on standing.  He reports that his  urine is very dark.  He states, I just can't hold anything down.  He  has had mild progressive shortness of breath, no orthopnea, no PND,  other than fatigue.  He had some orthostatic changes on standing but no  presyncope or syncope.  He has subjective fever and mild lower extremity  edema.  He denies any palpitations.  He has a chronic cough with no  significant sputum production.  He has been taking his lisinopril,  Micardis, as well as his Lasix, even though he was not tolerating his  diet well.  He recently had his Lasix increased at his outside doctor's  office.  On presentation to the office, he is diaphoretic on sitting up.  He has a thready blood pressure of 85/50 with a pulse in the 90s.  His  O2 saturations are stable in the 90s.  The patient was  given an IV and  given 1 L of normal saline while here in the office and then sent to the  emergency room for admission.   REVIEW OF SYSTEMS:  As per HPI.  All others are negative.   CURRENT MEDICATIONS:  1. Metoprolol 50 mg twice daily.  2. Prednisone 20 mg daily.  3. Lisinopril 40 mg daily.  4. Diltiazem 240 mg daily.  5. Lipitor 40 mg daily.  6. Nexium 40 mg daily.  7. Spiriva once daily.  8. Alprazolam p.r.n.  9. Symbicort 160/45 mcg one puff twice daily.  10.Micardis/hydrochlorothiazide 40/12.5 mg daily.   ALLERGIES:  None.   FAMILY HISTORY:  Positive for heart disease and lung cancer.   SOCIAL HISTORY:  He is married.  He has approximately a 75 pack-year  history of tobacco, quit 10 years ago.  Does drink somewhere between 2  and 4 beers per day.  His wife reports he has not had any alcohol to  drink since Sunday either.   EXAM:  His weight is 225 pounds, down 9 pounds from the last  visit.  His  blood pressure is 85-90 over 40-50.  His heart rate is 95 and regular.  GENERAL:  He is a very pleasant, elderly white male appearing very  fatigued.  He does report orthostatic symptoms when he changes from a  lying to a sitting as well as a sitting to a standing position.  He has no JVD.  LUNGS:  Coarse breath sounds throughout with occasional wheeze noted.  HEART:  Regular, distant, with a normal S1-S2.  ABDOMEN:  Soft, nontender, nondistended, no rebound or guarding.  EXTREMITIES:  No significant edema.  NEUROLOGIC:  Cranial nerves are intact with no significant focal  deficits.   His blood work shows normal cardiac enzymes with acute renal failure  with a BUN and creatinine of 81 and 7.8.  His sodium is 127 with  potassium of 3.7.  His glucose was 157.  His CBC shows a white count of  13.0, hemoglobin of 12.9, platelet count of 358.  His chest x-ray shows  no acute cardiopulmonary disease.  His ECG shows ectopic atrial rhythm  with a rate of 95 per minute, old inferior  infarct, with poor R-wave  progression.  No significant changes from his prior.  He does have known  inferior ST elevation, which is again seen on this tracing.   IMPRESSION:  1. Dehydration and hypotension.  2. Acute renal failure, likely secondary to #1.  3. Chronic obstructive pulmonary disease exacerbation.  4. Diarrhea.  5. History of hypertension, now with hypotension.   PLAN:  1. At this time the patient will be admitted to telemetry monitoring      for further evaluation.  He was given 1 L of normal saline while in      the office with significant improvement in his overall well-being.      He will be given another liter and then run at 125 mL/hr.  He will      have a Foley placed to monitor his urine output.  Should his renal      function not improve, then a renal consult will be obtained.  He      will also have a renal ultrasound to evaluate for any obstructive      disease.  I feel most of this is likely prerenal because of his      diarrhea and inability to tolerate p.o.'s.  2. For his diarrhea, he will be placed on Lomotil and start treatment      for C. difficile colitis with Flagyl 500 mg q.8h.  He will have      stool studies sent.  Aggressive supportive care at this time.  3. From a pulmonary standpoint, we will continue his nebulizers and      give him Solu-Medrol right now to help with his air movement.  We      will empirically start him on Avelox for a possible bronchitic      component here.  4. From a cardiovascular standpoint, the patient is hypotensive and I      think this is all secondary to his prerenal state.  He will receive      aggressive IV fluid hydration.  He will have an echocardiogram      because of low voltages throughout his ECG to make sure he has no      pericardial effusion and for further evaluation of his LV function.      He will also have serial cardiac enzymes performed.  His  blood      pressure medicines will be held at this time.   5. Disposition.  I did discuss with his wife that he is pretty sick      right now with acute renal failure as well as likely bronchitis.      We will treat him aggressively to see how well his body will      respond to supportive care.  At this time the patient and wife      elect for full code status.      Elmore Guise., [name]  Electronically Signed     TWK/MEDQ  D:  12/11/2006  T:  12/11/2006  Job:  [id number]

## 2010-07-31 NOTE — Discharge Summary (Signed)
NAMETIMOTHEUS, SALM NO.:  [id number]   MEDICAL RECORD NO.:  [id number]          PATIENT TYPE:  INP   LOCATION:  2038                         FACILITY:  [facility]   PHYSICIAN:  Elmore Guise., M.D.DATE OF BIRTH:  [birth year]   DATE OF ADMISSION:  12/11/2006  DATE OF DISCHARGE:  12/29/2006                               DISCHARGE SUMMARY   DISCHARGE DIAGNOSES:  1. Salmonella gastroenteritis treated with 2-week treatment of Avelox  2. Acute renal failure secondary to acute tubular necrosis and      prerenal azotemia requiring urgent hemodialysis x2 with      normalization with stopping his Micardis, lisinopril and aggressive      supportive care with intravenous hydration.  3. Paroxysmal atrial fibrillation.  4. Hypertension.  5. Chronic obstructive pulmonary disease.  6. Delirium tremens and alcohol withdrawal.  7. Gout   HISTORY OF PRESENT ILLNESS:  Mr. Escher is a 77 year old white male with  multiple medical problems who was admitted with 2-week history of  decreased p.o. intake, diarrhea, fatigue and malaise.  On presentation  to the office, he was hypotensive with blood pressures in the low 80s.  He was admitted with acute renal failure and creatinine of 7.8.   HOSPITAL COURSE:  The patient's hospital course was prolonged.  He was  initially admitted to the floor and given aggressive IV fluid support.  His condition worsened secondary to mental status changes and likely  DTs.  He was transferred to the ICU for further care.  There, he was  treated with Ativan withdrawal protocol.  He also underwent hemodialysis  x2 with significant improvement in his acid base status.  He never did  require intubation.  After a 4-day stay in the ICU, the patient was  transferred to step-down.  He had a 5-day stay in step-down without any  significant problems.  He was then transferred to the floor.  He  remained very fatigued and deconditioned.  He was working with PT and  OT  daily.  His renal function improved and actually got back to baseline  for him on discharge.  He has now been up and ambulatory, walking in the  hall.  He has had no further difficulty with atrial fibrillation and is  out of the window for continued DTs.  His renal function on discharge  showed a BUN of 23 and a creatinine of 1.4 with a potassium level of  3.8.  His hemoglobin on discharge was 9.7 with a white count of 12.1 and  a platelet count of 444.  His white count was significantly elevated  initially during his hospitalization with white blood cell count greater  than 40,000.  This was felt secondary to steroids as well as his  Salmonella gastroenteritis.  He will be discharged home today with home  health R.N., home health PT and OT as well as chronic O2 at 2 liters per  minute.   DISCHARGE MEDICATIONS:  1. Metoprolol 50 mg twice daily.  2. Prednisone 10 mg daily for 1 week then 5 mg daily.  3. Lasix 20  mg daily.  4. Diltiazem ER 240 mg daily.  5. Lipitor 40 mg daily.  6. Nexium 40 mg daily.  7. Tanigem 106 mg daily.  8. Spiriva inhaler once daily.  9. Either Advair Diskus two times daily or Symbicort 1 puff twice      daily.  10.Aleve as needed.  11.Digoxin 0.125 mg daily.  12.Seroquel 50 mg each night.  13.Alprazolam p.r.n.   NEW MEDICATIONS:  1. Colchicine 0.6 mg daily for gout.  2. Hydralazine 25 mg two times daily.   He was told to stop his lisinopril and Micardis.  Both the patient and  wife voice understanding of his medications as listed.  His follow-up  appointment will be with Dr. Reyes Ivan at [facility] in 2 weeks  for regular office visit and blood work.  He will continue CPAP at night  for his obstructive sleep apnea with nebs on an as-needed basis.  All  his questions were answered prior to discharge.      Elmore Guise., [name]  Electronically Signed     TWK/MEDQ  D:  12/29/2006  T:  12/29/2006  Job:  [id number]

## 2010-07-31 NOTE — Letter (Signed)
Aug 11, 2007   Drue Second, [name]  [address] [place], [place] [address]   Re:  RESEAN, BRANDER                DOB:  1932/08/30   Dear Dr. Konrad Dolores:   I saw Mr. Nemetz back today. He looks great. He really was sick when he  was in the hospital with renal failure.   His blood pressure today was 140/78, pulse 68, respirations 18,  saturations 95%.  LUNGS:  Clear to auscultation and percussion.   I hope everything is going well with you. I plan to see Mr. Swiderski back  again in November after his next CT.   Ines Bloomer, [name]  Electronically Signed   DPB/MEDQ  D:  08/11/2007  T:  08/11/2007  Job:  [id number]

## 2010-07-31 NOTE — Assessment & Plan Note (Signed)
OFFICE VISIT   BART, ASHFORD  DOB:  Dec 12, 1932                                        February 17, 2008  CHART #:  [id number]   HISTORY OF PRESENTING ILLNESS:  This is a pleasant 77 year old Caucasian  male who had a right VATS, right thoracotomy, right middle and lower  lobectomy with intercostal muscle flap and lymph node dissection by Dr.  Edwyna Shell on 10/30/2004.  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  The patient then underwent a Port-A-Cath placement for chemotherapy  treatment on 11/30/2004.  The patient was last seen in the office on  08/11/2007.  The CT of the chest done at that time showed no evidence of  recurrence.  The patient is still on about 3 liters of home O2.  He has  no other complaints at this time.   PHYSICAL EXAMINATION:  VITAL SIGNS:  BP 131/78, pulse rate 74,  respirations 18, and O2 sat 95% on room air.  CARDIOVASCULAR:  Regular rate and rhythm.  PULMONARY:  Clear to auscultation bilaterally.  No rales, wheezes, or  rhonchi.   CT of the chest done on 02/10/2008 revealed no specific features and  residual recurrence of tumor.  There was a nonspecific density in the  medial right lung base (4.1 mm).   IMPRESSION AND PLAN:  1. Stage IIA non-small cell cancer of the right middle and right lower      lobes.  2. No specific features show the recurrence of tumor.  3. The patient was going to have a chest x-ray done in May.  He will      then have a followup appointment to see Dr. Edwyna Shell shortly      thereafter.    Ines Bloomer, [name]  Electronically Signed   DZ/MEDQ  D:  02/17/2008  T:  02/17/2008  Job:  [id number]   cc:   Drue Second, MD

## 2010-07-31 NOTE — Consult Note (Signed)
[name] Stevenson, DAVENPORT NO.:  [id number]   MEDICAL RECORD NO.:  [id number]          PATIENT TYPE:  INP   LOCATION:  4742                         FACILITY:  [facility]   PHYSICIAN:  [name], M.D.DATE OF BIRTH:  [birth year]   DATE OF CONSULTATION:  12/12/2006  DATE OF DISCHARGE:                                 CONSULTATION   REFERRING PHYSICIAN:  Rosine Abe, [name]   REASON FOR CONSULTATION:  Acute renal failure.   HISTORY OF PRESENT ILLNESS:  This is a 77 year old, white male admitted  on December 11, 2006, because of hypotension.  He has had a 5-day  history of diarrhea and poor p.o. intake.  He presented [name]  office and was found to have a systolic blood pressure of 85.  He has no  known history of renal disease and specifically denies any history of  gross hematuria, kidney stones or obstructive type symptoms.  The most  recent serum creatinine that I have available is from 2006, when it was  1.0.  He has been on lisinopril and Micardis and takes Aleve three a day  for years.  His urine output was only 200 mL yesterday, though he is  currently receiving IV fluids.  Renal ultrasound done earlier today  shows no hydronephrosis.   PAST MEDICAL HISTORY:  1. Lung cancer, status post right middle and lower lobectomy in 2006.  2. Status post splenectomy.  3. History of alcohol abuse.  4. History of nonobstructive coronary disease by catheterization in      2006.  5. History of paroxysmal atrial fibrillation.  6. History of hyperlipidemia.  7. History of COPD.  8. History of sleep apnea on CPAP.   ALLERGIES:  No known drug allergies.   CURRENT MEDICATIONS:  1. Lomotil.  2. Solu-Medrol 60 mg q.6 h.  3. Protonix 40 mg a day.  4. Flagyl 800 mg q.8 h.  5. Symbicort nebulizers b.i.d.  6. Spiriva daily.  7. Xopenex nebulizers q.6 h.  8. Aspirin 81 mg a day.  9. Ativan 2 mg q.6 h.  10.Avelox 400 mg daily.   SOCIAL HISTORY:  A 75-pack-year  history of smoking, although he quit 10  years ago and drinks 2-4 beers a day.  He is married and [place]   FAMILY HISTORY:  Negative for renal disease.   REVIEW OF SYSTEMS:  His appetite is slightly better today than it has  been, but still somewhat decreased.  He has had no vomiting and says his  diarrhea is slightly better.  He denies any abdominal pain.  No new  arthritic complaints.  No new skin rashes.  The rest of the review of  systems unremarkable.   PHYSICAL EXAMINATION:  VITAL SIGNS:  Blood pressure is 102/66,  temperature 98, pulse 90.  GENERAL:  A 77 year old, white male in no acute distress.  HEENT:  Sclera nonicteric.  Extraocular muscles are intact.  NECK:  No JVD.  No lymphadenopathy.  LUNGS:  Scattered wheezes, slightly decreased breath sounds in the right  base.  HEART:  Regular rate and rhythm without murmur, rub or gallop.  ABDOMEN:  Positive bowel sounds, nontender, nondistended.  No  splenomegaly.  No bruits.  EXTREMITIES:  No edema.  NEUROLOGIC:  Cranial nerves intact.  He is oriented x3.  There is no  asterixis.   LABORATORY DATA AND X-RAY FINDINGS:  Sodium 131, potassium 3.9, chloride  96, bicarb 16, BUN 92, creatinine 7.9.  Urinalysis reveals a benign  microscopic exam, positive protein.  White count is 28,000, platelet  count 343,000, hemoglobin 11.8.   IMPRESSION:  Acute renal failure secondary to hypotension/dehydration in  the face of an angiotensin-converting enzyme inhibitor, angiotensin-  receptor blocker and nonsteroidal medication.   RECOMMENDATIONS:  1. Agree with IV hydration.  2. Will start Bicitra 30 mL t.i.d.  3. Will check a phosphorus.  4. Renal diet.  5. Daily serum creatinines.  6. I specifically told the patient in the future to hold his      lisinopril and Micardis when he is at risk for dehydration.  7. I also told him the combination of nonsteroidal medications on an      ACE and ARB does not bode  well long-term for his kidneys and I      asked him to avoid nonsteroidal medications in the future.  8. I would like to see how his renal function does over the next 24-48      hours before starting hemodialysis.  I discussed the possibility of      hemodialysis with both the patient and his wife and he would be      willing to do it if needed.  I am optimistic that if he did need      dialysis it would be only temporary and I remain optimistic his      renal function should recover.   Thank you much for the consult and I will follow the patient with you.           ______________________________  [name], [name]     MTM/MEDQ  D:  12/12/2006  T:  12/12/2006  Job:  [id number]

## 2010-07-31 NOTE — Assessment & Plan Note (Signed)
[facility]                             PULMONARY OFFICE NOTE   ALAND, CHESTNUTT                       MRN:          [id number]  DATE:10/15/2006                            DOB:          [birth year]    PULMONARY FOLLOW-UP:   SUBJECTIVE:  [name] Stevenson is a 77 year old gentleman with severe COPD and  chronic hypoxemia.  He also has a history of non-small cell lung cancer,  status post right upper lobectomy and adjuvant chemotherapy and  radiation therapy in 2006.  He has been followed with serial chest CTs  by [name] Second, Stevenson.  Since our last visit he tells me that he has had  no real changes in his pulmonary status.  He is quite limited as to his  activity and exertional tolerance.  He is able to walk approximately 50-  100 feet before he has to stop to rest.  Since our last visit with [name] Stevenson in April, he has gained approximately 10-11 pounds.  He denies  any cough.  He does not wheeze frequently.  He has been using his  medications as directed, although he recently ran out of his Symbicort  and has substituted an old Advair Diskus that he had at home for this.   CURRENT MEDICATIONS:  1. Digoxin 0.125 mg daily.  2. Diltiazem 240 mg daily.  3. Lisinopril 40 mg daily.  4. Metoprolol 50 mg daily.  5. Lipitor 40 mg daily.  6. Spiriva 18 mcg one inhalation daily.  7. Oxygen at 2.5 L/min.  8. Lasix 40 mg daily.  9. Symbicort 160/4.5 mcg two puffs b.i.d.  10.Nexium 40 mg daily.  11.Miralax 17 g daily.  12.Over-the-counter potassium once daily.  13.Alprazolam p.r.n.  14.Lasix p.r.n. for extra edema.   EXAM:  GENERAL:  This is an obese, somewhat debilitated gentleman  wearing his oxygen.  His weight is 237 pounds, up from 228 pounds at his last visit.  Temperature 98.3, blood pressure 140/82, heart rate 72, SPO2 98% on 3  L/min. nasal cannula.  Oropharynx is benign.  NECK:  Supple without lymphadenopathy or stridor.  LUNGS:  Slightly distant  but with good air movement and no wheezing.  HEART:  Regular without murmur.  ABDOMEN:  Obese, soft, nontender, with positive bowel sounds.  EXTREMITIES:  Some trace pretibial edema.  NEUROLOGIC:  He has a nonfocal exam.   IMPRESSION:  1. Severe chronic obstructive pulmonary disease.  2. Chronic hypoxemia.  3. Stage II non-small cell lung cancer, status post right upper      lobectomy and adjuvant chemotherapy and radiation therapy.  4. Gastroesophageal reflux.  5. Coronary artery disease.   PLANS:  1. He will have a repeat CT scan of his chest done in November 2008      per [name] Stevenson plans to follow up for his non-small cell lung      cancer.  2. I will continue his Symbicort and Spiriva and supplemental oxygen      as ordered.  3. I will increase his Lasix to 80 mg daily for the next 4  days, then      he will change back to his usual dose of 40 mg daily.  While he is      on the increased Lasix, he will use two potassium pills on      alternate days and then switch back to one pill daily once he is on      his stable Lasix dose.  He will follow up with Dr. Reyes Stevenson regarding      his final Lasix dosing and will report to him any improvement that      he experienced when we changed his dose.  4. [name] Stevenson will follow up with me in 3 months or sooner if he has      any difficulty in the interim.     [name] Peer, Stevenson  Electronically Signed    RSB/MedQ  DD: 10/15/2006  DT: 10/16/2006  Job #: [id number]   cc:   [name] Guise., [name]  [name] Second, Stevenson  [name] Stevenson

## 2010-07-31 NOTE — Assessment & Plan Note (Signed)
OFFICE VISIT   BUTCH, OTTERSON  DOB:  [birth year]                                        March 01, 2009  CHART #:  [id number]   The patient came for followup today.  He is doing well overall.  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  His CT  scan done on November 30 showed no evidence of recurrence of his cancer,  4-1/2 years since his surgery.  Blood pressure is 152/80, pulse 60,  respirations 16, sats were 97%.  I will see him back again in 6 months  with his next final CT scan.   Ines Bloomer, [name]  Electronically Signed   DPB/MEDQ  D:  03/01/2009  T:  03/02/2009  Job:  [id number]

## 2010-07-31 NOTE — Letter (Signed)
October 17, 2009   Drue Second, MD  [address] [place], [place] [address]   Re:  [name] Stevenson, [name] Stevenson                DOB:  04-16-1932   Dear Dr. Welton Flakes:   I saw the patient back.  He is 5 years since his surgery, and CT scan  showed no evidence of recurrence of his cancer.  There was a small 14-mm  sebaceous cyst to the left of his thyroid that is unchanged.  He is  doing well overall.  Since it has been over 5 years, I will release him  for your long-term followup, and I will be happy to see him again if he  has any future problems.  His blood pressure is 150/91, pulse 78,  respirations 20, sats were 97%.   Ines Bloomer, [name]  Electronically Signed   DPB/MEDQ  D:  10/17/2009  T:  10/18/2009  Job:  [id number]

## 2010-08-02 ENCOUNTER — Other Ambulatory Visit: Payer: Self-pay | Admitting: *Deleted

## 2010-08-02 MED ORDER — COLCHICINE 0.6 MG PO TABS: 0.6000 mg | ORAL_TABLET | Freq: Every day | ORAL | Status: DC

## 2010-08-02 NOTE — Telephone Encounter (Signed)
Faxed refill for colcrys

## 2010-08-03 NOTE — Cardiovascular Report (Signed)
NAME:  [name] Stevenson, [name] Stevenson NO.:  [id number]   MEDICAL RECORD NO.:  [id number]          PATIENT TYPE:  OIB   LOCATION:  6501                         FACILITY:  [facility]   PHYSICIAN:  Elmore Guise., M.D.DATE OF BIRTH:  1932-06-19   DATE OF PROCEDURE:  10/26/2004  DATE OF DISCHARGE:  10/26/2004                              CARDIAC CATHETERIZATION   INDICATIONS FOR PROCEDURE:  Abnormal stress test for right middle and right  lower lobe lobectomy.   DESCRIPTION OF PROCEDURE:  The patient was brought to cardiac cath lab after  appropriate informed consent. He was prepped and  draped in a sterile  fashion. Approximately 20 cc of 1% lidocaine was used for local anesthesia.  A 4-French sheath was placed in right femoral artery without difficulty.  Coronary angiography was then performed. Multiple attempts were made in  placing the pigtail catheter across the aortic valve and were successful;  however, the patient had a significant ventricular ectopy on each passing of  the catheter across the aortic valve. Therefore no ventriculogram was  performed.   RESULTS:  1.  Left main: No significant disease.  2.  LAD: Diffuse mild to moderate luminal irregularities with mid vessel      showing 30-40% stenosis. It is a moderate-size vessel throughout its      course with a wrap around of the apex.  3.  D1/D2: Small vessels with mild luminal irregularity.  4.  LCX: Nondominant with mild luminal irregularities.  5.  OM1/OM2/OM3: Moderate-sized with mild luminal irregularities.  6.  RCA: Dominant with mild to moderate luminal irregularities. No      significant obstructive disease noted in the PDA or PLV.  7.  No left ventriculogram was done secondary to ventricular ectopy.  8.  Limited right femoral angiogram was performed showing significant right      femoral calcification but no significant obstructive peripheral vascular      disease was noted.   IMPRESSION:  1.   Nonobstructive coronary arteries.  2.  History of mild left ventricular dysfunction with an ejection fraction      of 45% by recent stress test.   PLAN:  1.  From a cardiovascular standpoint, the patient has no significant      obstructive coronary disease. He will be scheduled for an outpatient      echocardiogram to evaluate for any significant valvular heart disease as      well as to      evaluate RV and LV function. He is moderate risk from his comorbidities      for his surgery; however, no further cardiac workup is needed. He may      proceed with surgery as scheduled.  Recommend continued perioperative B-      Blockers, Stains, transfusion as needed should Hb fall < 10 to minimize      cardiac demand.       TWK/MEDQ  D:  10/26/2004  T:  10/27/2004  Job:  [id number]   cc:   Ines Bloomer, [name]  [address]  [place]  [place] [address]

## 2010-08-03 NOTE — H&P (Signed)
NAME:  GREENE, DIODATO NO.:  [id number]   MEDICAL RECORD NO.:  [id number]          PATIENT TYPE:  OIB   LOCATION:  2899                         FACILITY:  [facility]   PHYSICIAN:  Elmore Guise., M.D.DATE OF BIRTH:  09-Jan-1933   DATE OF ADMISSION:  10/23/2004  DATE OF DISCHARGE:                                HISTORY & PHYSICAL   HISTORY OF PRESENT ILLNESS:  The patient is very pleasant 77 year old white  male past medical history of hypertension, dyslipidemia, COPD, recently  diagnosed lung cancer, who presents for preoperative evaluation. The patient  has seen Dr. Edwyna Shell at [facility] for evaluation of his lung cancer. He underwent  bronchoscopy with biopsy on October 23, 2004 and he underwent an Adenosine  with limited exercise myocardial perfusion scan on October 22, 2004. His  biopsy was positive for a localized lung cancer in his right middle and  lower lobes. His stress test was also abnormal, showing an apical wall  defect with mildly decreased LV systolic function, EF of 46% with mild  global hypokinesis. He presents today for preoperative evaluation prior to  his lobectomy. He reports that he is limited to walking to approximately 100  feet secondary to dyspnea and fatigue. He has no chest pain, no presyncope,  no syncope, no orthopnea, no PND. He stated that his lung cancer was  diagnosed after he was hospitalized for pneumonia and hemoptysis. His  hemoptysis resolved after stopping his aspirin and treatment with  antibiotics. He had no further symptoms of that. He has no significant lower  extremity edema. He reports his weight is stable. He quit smoking  approximately eight years ago and had no significant complaints at this  time.   REVIEW OF SYSTEMS:  Are otherwise negative.   CURRENT MEDICATIONS:  1.  Metoprolol 50 milligrams b.i.d.  2.  Prednisone 10 milligrams b.i.d.  3.  Lasix 20 milligrams daily.  4.  Lisinopril 40 milligrams daily.  5.  Diltiazem  240 milligrams daily.  6.  Lipitor 40 milligrams daily.  7.  Nexium 40 milligrams daily.  8.  Iron pill 160 milligrams daily.  9.  Spiriva inhaler once daily.  10. Advair discus 250/50 micrograms 1 puff b.i.d.  11. Aleve 1 pill b.i.d.   ALLERGIES:  None.   FAMILY HISTORY:  Positive for coronary disease in his father who died at age  68 from heart attack. His mother had hypertension and lung cancer. He had  one sibling who died at age 3 of a heart attack.   SOCIAL HISTORY:  He is married for the last 50 years. He is retired Ecologist. Does not exercise. Has a 75-pack-year history of tobacco, quit 8  years ago. Drinks two beers per day. Also drinks two caffeinated beverages  daily. He has two grown living children-one died secondary to an overdose at  age 69.   PAST SURGICAL HISTORY:  1.  Hiatal hernia surgery x2.  2.  Bilateral knee and shoulder arthroscopic surgery.  3.  Skin cancer removal from his nose.  4.  Bone spur removal.  5.  He had a heart catheterization 8 years ago which the patient reports was      nonobstructive.   PHYSICAL EXAMINATION:  VITAL SIGNS:  His weight is 227 pounds, blood  pressure 122/80. His heart rate 88 and regular.  GENERAL:  In general, he is a very pleasant elderly white male alert and  oriented x4 in no acute distress.  NECK:  He has no significant JVD and no bruits.  LUNGS:  Lungs were clear with prolonged forced expiratory phase.  HEART:  Heart is regular with a normal S1-S2. No significant murmurs,  gallops or rubs.  ABDOMEN:  Abdomen is obese, soft, nontender, nondistended. No rebound or  guarding.  EXTREMITIES:  Extremities were warm with 1+ dorsalis pedis and trace  anterior tubal pulses bilateral. He has 1+ femoral pulses noted bilaterally.  NEUROLOGICAL:  He is intact with no significant focal deficits.  SKIN:  Shows bruises noted on his forearms and lower legs.   EKG showed sinus tachycardia rate of 105 per minute, poor R-wave  progression  across his precordial leads and nonspecific ST-T wave changes. His labs  including BNP, CBC and coagulases were pending at time of dictation.   IMPRESSION:  1.  Preoperative evaluation prior to right middle and right lower lobe      lobectomy.  2.  Multiple cardiac risk factors.  3.  Abnormal stress test.   PLAN:  1.  We will schedule the patient for diagnostic cardiac catheterization to      evaluate for any significant obstructive coronary disease. Discussed      risk and benefits of this procedure with him and his wife at length.      Both agree to proceed with the procedure. Procedure will be scheduled      for October 26, 2004.  Further recommendations pending cardiac cath.       TWK/MEDQ  D:  10/25/2004  T:  10/26/2004  Job:  [id number]   cc:   Ines Bloomer, [name]  [address]  [place]  [place] [address]

## 2010-08-03 NOTE — Op Note (Signed)
[name] Stevenson, [name] Stevenson                ACCOUNT NO.:  [id number]   MEDICAL RECORD NO.:  [id number]          PATIENT TYPE:  AMB   LOCATION:  SDS                          FACILITY:  [facility]   PHYSICIAN:  Ines Bloomer, [name] DATE OF BIRTH:  06-22-1932   DATE OF PROCEDURE:  11/30/2004  DATE OF DISCHARGE:                                 OPERATIVE REPORT   PREOPERATIVE DIAGNOSES:  Stage IIA nonsmall cell lung cancer status post  right middle lobe and right lower lobectomy.   POSTOPERATIVE DIAGNOSES:  Stage IIA nonsmall cell lung cancer status post  right middle lobe and right lower lobectomy.   OPERATION PERFORMED:  Insertion of left subclavian Port-A-Cath.   SURGEON:  Dr. Patricia Nettle. [place]   ANESTHESIA:  IV sedation and 1% Xylocaine.   DESCRIPTION OF PROCEDURE:  After prepping and draping the left chest the  area was then infiltrated with 1% Xylocaine in the infraclavicular area and  a left subclavian puncture was performed and under fluoro guidance, the  guidewire was threaded to the right atrium.  The needle was removed.  The  stab wound was made around the guidewire.  Another area was infiltrated with  1% Xylocaine.  This transverse incision was made and a Port-A-Cath pocket  was dissected out.  The Port-A-Cath was placed in the pocket and sutured in  place with 2-0 silk and then the tubing tunnel from the pocket to the stab  wound with a tunneling device.  It was then measured and cut appropriately.  Over the guidewire was passed a dilator with a peel-away sheath.  The  dilator and the guidewire were removed.  The tubing was passed through the  peel-away sheath and removed.  This was confirmed to be in the RA/SVC  junction by fluoro.  It flushed easy and went through easily.  Wound was  closed with 3-0 Vicryl and Dermabond for the skin.  Patient returned to  recovery room in stable condition.           ______________________________  Ines Bloomer, [name]     DPB/MEDQ  D:   11/30/2004  T:  11/30/2004  Job:  [id number]

## 2010-08-03 NOTE — Assessment & Plan Note (Signed)
[name] Stevenson                             PULMONARY OFFICE NOTE   NAME:[name] Stevenson, [name]                       MRN:          [id number]  DATE:07/16/2006                            DOB:          25-Jun-1932    This is a very pleasant 77 year old gentleman who follows here for  chronic obstructive pulmonary disease. He is oxygen dependent. He is  also status post lobectomy for right upper lobe nonsmall cell carcinoma  of the lung. He is status post 4 cycles of adjuvant chemotherapy with  carboplatin and Taxol as well as radiation. He had completed these  therapies in November of 2006. His carcinoma was classified as stage 2.  He is expertly managed by Dr. Drue Second.   The patient also has a history of gastroesophageal reflux. He is status  post Nissen Fundoplication in 1975. He had stricture in September of  2006 and dilated by Dr. [name].   Patient presents today with absolutely no complaints however, his wife  does note that he has had issues with increasing dyspnea over the last  day or so. She relates this more to weather. The patient denies any  fevers, chills, or sweats, and has had no cough, or sputum production.  He has been bothered by the pollen. He does state that he has had  itchy, watery eyes, etc.   CURRENT MEDICATIONS:  Are as noted on the intake sheet. These have been  reviewed and are accurate.   PHYSICAL EXAMINATION:  VITAL SIGNS: Noted, oxygen saturation was 98% on  2 and a half liters nasal canula O2.  IN GENERAL: This is a well-developed, somewhat obese gentleman who is in  no acute distress.  HEENT EXAMINATION: Unremarkable with the exception that he does have  some mild allergic conjunctivitis and some turbinate edema.  NECK: Supple. No adenopathy noted. No JVD.  LUNGS: He has some faint in-and-expiratory wheezes throughout.  CARDIAC EXAMINATION: Regular rate and rhythm. No rubs, murmurs, gallops  heard.  EXTREMITIES:  Patient has no cyanosis, no clubbing, no edema noted.   IMPRESSION:  1. Mild exacerbation of chronic obstructive pulmonary disease due to      allergic flare.  2. Carcinoma of the lung, stage 2, status post lobectomy in the past,      and status post chemo/radiation per oncology.  3. Gastroesophageal reflux, currently not an issue.   PLAN:  1. Therefore will be to give the patient prednisone taper.  2. No antibiotics are indicated at present as there is no evidence of      infection.  3. Follow up will be with our nurse practitioner [name] Stevenson in 2      weeks. He is to follow up after that with Dr. Levy Pupa as I      will be leaving the practice.     Gailen Shelter, MD  Electronically Signed    CLG/MedQ  DD: 07/17/2006  DT: 07/17/2006  Job #: [id number]

## 2010-08-03 NOTE — Op Note (Signed)
NAMEROBSON, TRICKEY NO.:  [id number]   MEDICAL RECORD NO.:  [id number]          PATIENT TYPE:  OIB   LOCATION:  2899                         FACILITY:  [facility]   PHYSICIAN:  Ines Bloomer, [name] DATE OF BIRTH:  Jul 27, 1932   DATE OF PROCEDURE:  10/23/2004  DATE OF DISCHARGE:                                 OPERATIVE REPORT   PREOPERATIVE DIAGNOSIS:  Nonsmall cell lung cancer, right lower lobe.   POSTOPERATIVE DIAGNOSIS:  Nonsmall cell lung cancer, right bronchus  intermedius with hemoptysis.   OPERATION PERFORMED:  Video bronch.   SURGEON:  Ines Bloomer, [name]   ANESTHESIA:  Cetacaine, Xylocaine.   INDICATIONS FOR PROCEDURE:  This patient was referred for possible lobectomy  and I decided to do a bronchoscopy to see whether we could do a bilobectomy  on him, a right middle lobe and right lower lobectomy, whether a  pneumonectomy would be required.  He had a previous diagnosis from a  previous bronchoscopy of nonsmall cell lung cancer.   DESCRIPTION OF PROCEDURE:  After local anesthesia with Cetacaine and  Xylocaine, the video bronchoscope was passed through the mouth.  The cords  were in the midline.  The carina was in the midline.  The patient had very  hypertrophied posterior membrane so that it looked like he had reactive  airway disease.  The left main stem, left upper lobe and left lower lobe  orifices were normal except for hypertrophic posterior membrane.  On the  right side the right upper lobe orifice was normal but the minute you passed  the take off of the right upper lobe the cancer in the bronchus intermedius  could be seen.  There maybe is 1 cm distance between the take off and the  cancer and it bled very easily.  It was irrigated copiously and then  pictures were taken.  The video bronchoscope was removed.  The patient was  returned to the recovery room in stable condition.       DPB/MEDQ  D:  10/23/2004  T:  10/24/2004  Job:   [id number]

## 2010-08-03 NOTE — Assessment & Plan Note (Signed)
[facility]                               PULMONARY OFFICE NOTE   DYQUAN, MINKS                       MRN:          [id number]  DATE:12/23/2005                            DOB:          Apr 04, 1932    This is a 77 year old gentleman follows here for chronic obstructive  pulmonary disease with asthmatic bronchitic component.  The patient also has  non-small cell carcinoma of the lung.  He is status post lobectomy by Dr.  Ines Bloomer in the past.  He is also status post chemotherapy and  radiation therapy.  The patient follows up today, having no complaints.  During his last visit, we switched him to Symbicort 160/4.5, 2 inhalations  twice a day, and he continues to use Spiriva.  He feels that this has helped  him tremendously with regard to his dyspnea, which is now class III at  baseline.   The patient is oxygen dependent.  He has not required any increases in his  FiO2.  If anything, he has actually decreased his liter flow requirement to  2.5 liters.   CURRENT MEDICATIONS:  As noted on the intake sheet.  These have been  reviewed and are accurate.   PHYSICAL EXAMINATION:  VITAL SIGNS:  As noted.  Oxygen saturation is 98% on  2.5 liters nasal cannula O2.  GENERAL:  This is an obese, well-developed gentleman who is in no acute  distress.  HEENT:  Unremarkable.  NECK:  Supple.  No adenopathy noted.  No JVD.  LUNGS:  Clear to auscultation bilaterally.  He is moving air very well.  CARDIAC:  Regular rate and rhythm. No rubs, murmurs, or gallops.  EXTREMITIES:  The patient has no cyanosis, no clubbing, no edema noted.   IMPRESSION:  1. Chronic obstructive pulmonary disease, with asthmatic bronchitic      component.  The patient is well compensated on the current regimen.  2. Non-small cell carcinoma of the lung, status post lobectomy.  The      patient has been in remission for the same.   PLAN:  1. For the patient to continue his  Symbicort 160/4.5, 2 inhalations twice      a day along with spacer.  2. Continue Spiriva 1 capsule inhaled daily.  3. For health maintenance today, he received flu vaccine.  4. Followup will be in 4 months' time.  He is to contact us prior to that      time should any new problems arise.       Gailen Shelter, MD      CLG/MedQ  DD:  12/23/2005  DT:  12/24/2005  Job #:  [id number]

## 2010-08-03 NOTE — Op Note (Signed)
NAMERASHIDI, LOH NO.:  [id number]   MEDICAL RECORD NO.:  [id number]          PATIENT TYPE:  INP   LOCATION:  2899                         FACILITY:  [facility]   PHYSICIAN:  Ines Bloomer, [name] DATE OF BIRTH:  Jun 01, 1932   DATE OF PROCEDURE:  10/30/2004  DATE OF DISCHARGE:                                 OPERATIVE REPORT   PREOPERATIVE DIAGNOSIS:  Non-small cell cancer, right middle lobe.   POSTOPERATIVE DIAGNOSIS:  Non-small cell cancer, right middle lobe.   OPERATION PERFORMED:  Right video-assisted thoracoscopic surgery, right  middle lobectomy with intercostal muscle flap and lymph node dissection,  right thoracotomy.   SURGEON:  Ines Bloomer, [name]   FIRST ASSISTANT:  Coral Ceo, P.A.   General anesthesia.   After percutaneous insertion of all monitoring lines, the patient was given  general anesthesia and he was prepped and draped in the usual sterile  manner, turned to the right lateral thoracotomy position.  The patient had a  large __________ in the right lower lobe that was filling up the bronchus  intermedius.  This has been confirmed by bronchoscopy.  He had gotten a  preop cardiac evaluation and an extensive pulmonary evaluation.  His  pulmonary function tests were satisfactory for bilobectomy.  A dual-lumen  tube was inserted and prepped and draped in the usual sterile manner.  Two  trocar sites were made in the anterior and posterior axillary lines at the  seventh intercostal space.  Two trocars were inserted and a 30 degree scope  was inserted and the fissure appeared to be satisfactory to do a bilobectomy  and no other metastases were seen.  A posterolateral thoracotomy was made in  the fifth intercostal space.  The latissimus was divided and the serratus  was reflected anterior.  A portion of the sixth rib was taken posteriorly,  and the intercostal muscle flap was taken up between the fifth and the sixth  rib with electrocautery,  taking it off the sixth rib and then also off the  fifth rib.  This was then protected.  A Finochietto retractor was inserted  and a Tuffier placed at right angles.  Inferior pulmonary ligament was taken  down.  It was markedly inflamed.  It was taken down with electrocautery and  some 9R nodes were dissected free.  The cancer was growing right down the  inferior pulmonary vein and we were able to dissect out the inferior  pulmonary vein right at the pericardial reflection, and it was looped with a  vascular tape.  Then attention went to the subcarinal space, where multiple  7 nodes were dissected free as well as some 10R nodes, dissecting out the  subcarinal space by clipping all bronchial arteries, and bleeding was also  electrocoagulated.  After about eight nodes had been removed from this area,  attention was then turned to the fissure.  Dissection was carried down to  the fissure down to identifying the transverse vein and then below the  transverse vein, there were multiple large nodes that appeared to be  inflammatory more than cancerous.  These were slowly dissected out, some 11R  nodes, until the pulmonary artery was identified, and we were able to divide  the superior portion of the fissure with the EZ-45 stapler.  Then the minor  fissure was partially divided with the EZ-45 stapler and then divided with  the Echelon-60 stapler.  There was one branch to the right middle lobe, and  this pulmonary artery branch was stapled and divided with the Autosuture-30  wide Roticulator.  Then the venous branch of the middle lobe was divided  with the Autosuture-30 wide Roticulator.  The pulmonary artery was dissected  up and divided with the [facility] stapler.  Next the inferior pulmonary  vein was divided right on the heart with the Autosuture stapler.  The  multiple more nodes were dissected around the bronchus.  The bronchus was  dissected up to the takeoff of the right upper lobe and  stapled with a TL-30  stapler and resected.  Bronchial margins were close but just showed some  dysplastic cells.  There was some bleeding from the inferior portion of the  pulmonary artery, and this was repaired with a horizontal mattress Prolene  with pledgets.  FloSeal was applied to the staple line as well as some  sutures had to be placed in the staple line, and then the intercostal muscle  flap was placed on the bronchial stump, sutured in place with 3-0 silk.  Two  chest tubes were brought into the trocar place and then the On-Q catheter  was placed subpleurally in the usual fashion, and a Marcaine block was done  in the usual fashion.  The chest was closed with six paracostals of #1  Vicryl in the muscle layer, 2-0 Vicryl in the subcutaneous tissue, and  Dermabond and Ethicon skin clips.  The patient was returned to the intensive  care unit in serious condition.           ______________________________  Ines Bloomer, [name]     DPB/MEDQ  D:  10/30/2004  T:  10/30/2004  Job:  [id number]   cc:   Drue Second, MD  Fax: [phone]

## 2010-08-03 NOTE — Op Note (Signed)
[facility]  Patient:    [name] Stevenson, [name] Stevenson Visit Number: [id number] MRN: [id number]          Service Type: SUR Location: 3W [id number] [id number] Attending Physician:  [name] Stevenson Dictated by:   [name] Stevenson. [name] Stevenson, [name] Proc. Date: 03/26/01 Admit Date:  03/26/2001                             Operative Report  PREOPERATIVE DIAGNOSIS:  Erectile dysfunction.  POSTOPERATIVE DIAGNOSIS:  Erectile dysfunction.  PROCEDURE:  Insertion of Mentor Titan three piece inflatable penile prosthesis.  SURGEON:  [name] Stevenson. [name] Stevenson, [name]  ASSISTANT:  [name] Stevenson. [name] Stevenson, [name]  ANESTHESIA:  General.  DRAINS:  Foley catheter.  COMPLICATIONS:  None.  INDICATIONS:  The patient is a 77 year old white male with a long history of erectile dysfunction.  He has been on Viagra and has had side effects and does not tolerate.  Vacuum erection device injections were not effective for him.  FINDINGS AND DESCRIPTION OF PROCEDURE:  The patient was given 1 g of vancomycin, 80 mg of gentamicin IV preoperatively.  He was taken to the operating room where a general anesthetic was induced.  He was placed in the supine position, and his lower abdomen and genitalia were shaved.  He was then scrubbed for five minutes with Betadine scrub and prep with Betadine solution. He was then draped in the usual sterile fashion.  The urethra was instilled with 5 cc of Betadine solution and a 16-French Foley catheter was inserted without difficulty.  The balloon was filled with 10 cc of sterile fluid.  The bladder was then drained, the catheter was plugged.  A transverse incision was made just below the penoscrotal junction on the anterior scrotum.  The dartos was opened with the Bovie.  Care was taken to ensure that the urethra which was palpable via the Foley was reflected away from the corpora.  I then dissected down on the corpora using the Bovie and Metzenbaums.  Once the right corpora was exposed, two stay  sutures of 2-0 PDS were placed. I then exposed the left corpora.  At this point, the right corporotomy was created using a knife, approximately 1.5 cm to 2 cm in length, extending proximally from the stay sutures.  Once the corpora had been entered, the Metzenbaums were used to initiate the tract along within the corpora proximally and distally, and then he underwent dilation with the Mentor dilators from 8-French to 13-French without difficulty in both directions.  He was then measured and found to require a 20 cm prosthesis.  He was found to have 20 cm in length, so an 18 cm prosthesis with 2 cm rear-tip extenders were chosen.  He then underwent creation of the left corporotomy.  This was dilated in an identical fashion to the right and measurement once again confirmed the need for a 20 cm total length implant.  Both corpora were then infiltrated with antibiotic solution.  At this point, dissection was carried up into the left inguinal area.  The external ring was identified and entered, allowing dissection in the space of Retzius.  This was done bluntly.  At this point, a tightened bioflex reservoir with lock out valve, reference #[id number], 75 cc, serial #[id number] was placed into the retropubic space via the previously noted dissection.  The balloon was then filled with a total of 70 cc of fluid.  It was noted to be at neutral  pressure with this amount of fluid.  The tubing was then clamped with a rubber-shod hemostat.  He then underwent placement of the prosthetic cylinders.  The tight screw bioflex cylinder set with reference #[id number]-[place] 18 cm, serial #[id number].  The right cylinder was soaked in antibiotic solution and prepared.  A Furlow inserted.  The Mellody Dance needle was used to pass the traction suture through the right gland.  A 2 cm rear-tip extender was placed on the proximal tip which was then seated in the proximal corporotomy.  The distal end was then drawn into the distal  corpora.  Once the right-sided cylinder was in place, this procedure was repeated on the left.  Once both cylinders were in position, the cylinders were inflated using a syringe of sterile fluid.  He was noted to have no kinking or irregularity in the cylinders, and had excellent seating of the tips under the glans with an excellent straight erection.  At this point, the cylinders were deflated, and the tubing was clamped with a shod clamp.  He then underwent closure of the corporotomy using running 2-0 Prolene on each side.  Great care was taken to avoid injury to the cylinders using the suturing tool.  Once the corporotomy had been closed, a subdartos pouch was created in the right hemiscrotum for the pump.  The pump was then placed in position and the connection was made between the reservoir and the pump.  Once the connection had been made using the quick connect connectors with care being taken to irrigate the tubing during the connection process.  The device was cycled and was found to function appropriately.  He then underwent isolation of the tubing from the surface using interrupted 3-0 chromic sutures in the deep dartos and scrotal tissues.  Prior to this closure, the wound had been irrigated once again with antibiotic solution.  The superficial dartos layer was then closed with a running 3-0 chromic stitch.  The skin was closed with an intracuticular 4-0 Vicryl. The wound was cleansed and then sealed with collodion.  The tethering sutures had been removed from the cylinders earlier in the procedure.  At this point, a dressing of 4 x 4s, fluffs, Kerlix, and a scrotal support was applied.  The Foley was placed to straight drainage.  There were no complications during the procedure.  Please include for the record that the tightened assembly kit that was used was reference [id number], lot [id number]. Dictated by:   [name] Stevenson. [name] Stevenson, [name] Attending Physician:  [name] Stevenson DD:  03/26/01 TD:  03/26/01 Job: [id number]

## 2010-08-03 NOTE — Assessment & Plan Note (Signed)
[name] Stevenson                         GASTROENTEROLOGY OFFICE NOTE   [name] Stevenson, [name] Stevenson                       MRN:          [id number]  DATE:06/23/2006                            DOB:          [birth year]    [name] Stevenson is a very nice 77 year old gentleman whom we have followed for  gastroesophageal reflux disease.  He is status post Nissen  fundoplication in 1975.  Last endoscopy in September 2006 with reflux  esophagitis with stricture, which was dilated.  He since then has been  diagnosed with lung CA and underwent right lobectomy by Dr. Edwyna Shell,  status post chemotherapy for stage II squamous cell carcinoma.  He is  currently cancer free.  His oncologist Dr. Drue Second.  [name] Stevenson has  been complaining of constipation, in spite of taking Correctol 2 or 3  tablets several times a week; he sometimes has to take a double dose.  He denies any abdominal pain except for one episode of crampy abdominal  pain which occurred about a month ago.  He denies any visible blood per  rectum.   MEDICATIONS:  1. Digitek 0.125 mg p.o. daily.  2. Diltiazem 240 mg p.o. daily.  3. Lisinopril 40 mg p.o. daily.  4. Metoprolol 50 mg p.o. daily.  5. Spiriva 1 capsule daily.  6. Lasix 20 mg p.o. daily.  7. Nexium 40 mg p.o. daily.   PHYSICAL EXAMINATION:  Blood pressure 128/82, pulse 68, and weight 226  pounds.  The patient was overweight, he was on nasal oxygen.  He  appeared flushed in his face, alert and oriented.  LUNGS:  With decreased breath sounds.  COR:  Rapid S1, S2.  ABDOMEN:  Protuberant, soft, nontender, with normoactive bowel sounds,  liver edge at costal margin, lower abdomen was normal.  RECTAL:  Normal rectal tone, prostate was 2+, stool was strongly  Hemoccult positive.  EXTREMITIES:  No edema.   Last colonoscopy in 1997 was done because of heme positive stool.  He at  that time had mild diverticulosis of the left colon, no significant   hemorrhoids.   IMPRESSION:  18. A 77 year old white male with strongly heme-positive stool,      constipation, no visible rectal bleeding, status post squamous cell      resection of stage 2 squamous cell carcinoma of the lung in August      2007.  2. History of esophageal stricture, currently no dysphagia.  The      patient has been on Nexium daily.   PLAN:  1. Colonoscopy scheduled, this will be done with routine colonoscopy      prep with minimal sedation because the patient is oxygen dependent.  2. MiraLax 17 grams daily on daily basis for chronic constipation      which may be a combination of immobility, medications, and poor      eating habits.  He is to continue on all his other medications.     Hedwig Morton. Juanda Chance, MD  Electronically Signed    DMB/MedQ  DD: 06/23/2006  DT: 06/23/2006  Job #: [id number]   cc:  Elmore Guise., [name]  Drue Second, MD

## 2010-08-03 NOTE — Assessment & Plan Note (Signed)
[facility]                             PULMONARY OFFICE NOTE   NAME:[name] Stevenson, [name] Stevenson                       MRN:          [id number]  DATE:05/15/2006                            DOB:          [birth year]    This is a very pleasant 77 year old gentleman who I follow here for  chronic obstructive pulmonary disease with asthmatic bronchitic  component.  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  He completed therapies in November of 2006.  Patient is  managed by Dr. Drue Second for his oncologic needs.   He presents today with no complaints.  He is being maintained on Spiriva  and Symbicort.  He voices no difficulties with cough, sputum production  or any other issues.   CURRENT MEDICATIONS:  As noted on the intake sheet.  These have been  reviewed and are accurate.   PHYSICAL EXAMINATION:  GENERAL APPEARANCE:  This is an obese gentleman  who was in no acute distress.  VITAL SIGNS:  Noted oxygen saturation is 95% on 2.5 liters nasal cannula  O2.  HEENT:  Unremarkable.  NECK:  Supple.  No adenopathy noted.  No JVD.  LUNGS:  Clear to auscultation bilaterally.  He is moving air very well.  CARDIOVASCULAR:  Regular rate and rhythm, no murmurs, rubs, or gallops  heard.  EXTREMITIES:  Patient has no clubbing, cyanosis, or edema noted.   We did review the patient's CT scan, both chest and abdomen, done on  April 08, 2006.  These are free of metastatic disease  and no new  changes were noted.   IMPRESSION:  1. Chronic obstructive pulmonary disease with asthmatic bronchitic      component.  2. Carcinoma of the lungs, stage II, status post lobectomy and      adjuvant chemoradiation.   PLAN:  1. Patient to continue medications as they currently are.  2. Follow-up will be in two  months' time.  He is to contact us prior      to that time should any problems arise.     Gailen Shelter, MD  Electronically Signed    CLG/MedQ  DD: 05/15/2006  DT: 05/15/2006  Job #: [id number]

## 2010-08-03 NOTE — Op Note (Signed)
[facility]  Patient:    [name] Stevenson, [name] Stevenson Visit Number: [id number] MRN: [id number]          Service Type: DSU Location: DAY Attending Physician:  Skip Mayer Dictated by:   Georges Lynch Darrelyn Hillock, [name] Proc. Date: 06/10/01 Admit Date:  06/10/2001                             Operative Report  SURGEON:  Windy Fast A. Darrelyn Hillock, [name]  ASSISTANT:  Nurse.  PREOPERATIVE DIAGNOSES: 1. Complete bucket-handle tear of the medial meniscus, right knee. 2. Degenerative arthritis, right knee.  POSTOPERATIVE DIAGNOSES: 1. Complete bucket-handle tear of the medial meniscus, right knee. 2. Degenerative arthritis, right knee.  OPERATION: 1. Diagnostic arthroscopy, right knee. 2. Medial meniscectomy, right knee, posterior horn. 3. Synovectomy, right knee.  DESCRIPTION OF PROCEDURE:  Under general anesthesia, a routine orthopedic prep and draping of the right knee was carried out.  He had 1 g of IV Ancef preop. At this time, a small punctate incision was made in the suprapatellar pouch. Inflow cannula was inserted, and the knee was distended with saline.  Another small punctate incision mad in the anterolateral joint.  The arthroscope was entered, and a complete diagnostic arthroscopy was carried out.  Another small punctate incision was made in the medial joint space, and the shaver suction devise was inserted.  I identified a large bucket-handle of the posterior horn of the medial meniscus exactly like the MRI showed.  I shaved that out with the shaver suction device.  He also had a chronic synovitis.  I did a synovectomy as well.  The lateral joint space looked fine.  His cruciates were intact in the suprapatellar pouch.  He had some synovitis as well.  I thoroughly irrigated out the knee.  He had obvious degenerative changes like that of the medial femoral condyle and the medial tibial plateau.  Basically, if he ever needs a total knee, he may be a good  candidate for the Uni knee prosthesis.  Thoroughly irrigated out the knee.  The fluid was removed.  I closed all three punctate incisions with 3-0 nylon suture.  I injected 30 cc of 0.5% Marcaine with epinephrine into the knee joint.  Sterile Neosporin bundle dressings applied.  Left the operating room in satisfactory condition. Follow-up care, will be on Mepergan Fortis 1 q.4h. p.r.n. pain.  He will be on Bufferin 1 b.i.d. as an anticoagulant for couple of weeks.  He will be on crutches, partial weightbearing.  He will see me in two weeks prior to if there is any problem. Dictated by:   Georges Lynch Darrelyn Hillock, [name] Attending Physician:  Skip Mayer DD:  06/10/01 TD:  06/11/01 Job: [id number]

## 2010-08-03 NOTE — H&P (Signed)
NAME:  [name] Stevenson, [name] Stevenson                ACCOUNT NO.:  [id number]   MEDICAL RECORD NO.:  [id number]          PATIENT TYPE:  INP   LOCATION:  NA                           FACILITY:  [facility]   PHYSICIAN:  Ines Bloomer, [name] DATE OF BIRTH:  06/08/1932   DATE OF ADMISSION:  DATE OF DISCHARGE:                                HISTORY & PHYSICAL   CHIEF COMPLAINT:  Right lower lobe mass.   HISTORY OF PRESENT ILLNESS:  This 77 year old patient has had  thrombocytopenia by Dr. Drue Second and had a splenectomy with resolution  of his thrombocytopenia.  Now has platelet count that ranges about 600,000.  He had an episode hemoptysis two months ago and had a chest x-ray obtained  that showed a right lower lobe lesion.  Bronchoscopy revealed non-small-cell  cancer in the right lower lobe.  He quit smoking 8 years ago but smoked for  50 years.  He had a 4 to 6-pound loss and has continued to have low-grade  hemoptysis.  PET scan showed a 60 x 60 lesion in the right lower lobe.  There was no uptake in the subcarinal, mediastinal, or hilar adenopathy.  Pulmonary function tests showed FEV1 of greater than 2 with a diffusion  capacity of 60%.   He underwent preop evaluation of coronary artery disease because of  questionable finding of partial positive Cardiolite and was seen by Dr.  Reyes Ivan who said that he was satisfactory for resection, had nonobstructing  disease.  He has been treated for emphysema in the past.  He can climb up a  lot of stairs but does get short of breath.  He underwent bronchoscopy which  showed that the lesion was about 1 cm in the bronchus intermedius, but could  probably be resected with a right lower lobectomy.   PAST MEDICAL HISTORY:  No allergies.   MEDICATIONS:  1.  Lipitor 40 mg a day.  2.  Nexium 40 mg a day.  3.  Diltiazem 240 mg daily.  4.  Lisinopril 40 mg daily.  5.  Furosemide 20 mg daily.  6.  Lopressor 50 mg twice a day.   He had no previous surgery or  fractures, but he has hypercholesterolemia,  chronic obstructive pulmonary disease, and no other surgery except for the  splenectomy.   FAMILY HISTORY:  Positive for asthma, arthritis, heart disease, and  hypercholesterolemia.  His mother died at age 92 of lung cancer, and his  father died at age 70 of a myocardial infarction.  He has been told he also  has borderline diabetes.   SOCIAL HISTORY:  He is married and has three children.  He is retired.  He  quit smoking in 1997.  He drinks alcohol occasionally.   REVIEW OF SYSTEMS:  He is 220 pounds.  He is 5 feet 8 inches.  CARDIAC:  No  history of angina or atrial rhythm but does get shortness of breath with  exertion and sometimes with rest.  PULMONARY:  He has a productive cough and  the hemoptysis.  GI:  He has a hiatal hernia.  No nausea, vomiting, or  constipation.  GU:  No dysuria or frequent urination.  VASCULAR:  No  claudication, PIH, or DVT.  NEUROLOGIC:  No headaches, blackouts, or  seizures.  ORTHOPEDIC:  No joint pain but does have some chronic arthritis.  PSYCHIATRIC:  No psychiatric illnesses.  EYES:  He had degenerative change  in retina in 2001.  No change in hearing.  HEMATOLOGIC:  As in History of  Present Illness.   PHYSICAL EXAMINATION:  GENERAL:  He is a slightly obese Caucasian male in no  acute distress.  VITAL SIGNS: Blood pressure 182/90, pulse 80, respirations 18, O2 saturation  96%.  HEAD:  Atraumatic.  EYES:  Pupils equal, round, and reactive to light and accommodation.  Extraocular movements are normal.  EARS:  Tympanic membranes are intact.  NOSE:  No septal deviation.  THROAT:  Without lesions.  NECK:  Supple.  There is no thyromegaly, no supraclavicular or axillary  adenopathy.  CHEST:  Clear to auscultation and percussion and percussion on the left with  decreased breath sounds in the right lower lobe.  HEART:  Regular sinus rhythm, no murmur.  ABDOMEN:  Obese.  There is no hepatosplenomegaly.   There is a surgical scar.  Bowel sounds normal.  EXTREMITIES:  Pulses 2+. There is no clubbing or edema.  NEUROLOGIC:  He is oriented x 3.  Sensory and motor intact.  Cranial nerves  intact.  SKIN:  Without lesions.   IMPRESSION:  1.  Non-small-cell lung cancer, stage IB.  2.  Chronic obstructive pulmonary disease.  3.  Hypercholesterolemia.  4.  Degenerative arthritis.  5.  History of thrombocytopenia treated with platelets.  6.  Hypertension.  7.  Coronary artery disease.   PLAN:  Right VATS, also right middle and right lower lobectomy.           ______________________________  Ines Bloomer, [name]     DPB/MEDQ  D:  10/29/2004  T:  10/29/2004  Job:  [id number]

## 2010-08-03 NOTE — Discharge Summary (Signed)
[name] Stevenson, GELLES NO.:  [id number]   MEDICAL RECORD NO.:  [id number]          PATIENT TYPE:  INP   LOCATION:  3308                         FACILITY:  [facility]   PHYSICIAN:  Ines Bloomer, [name] DATE OF BIRTH:  [birth year]   DATE OF ADMISSION:  10/30/2004  DATE OF DISCHARGE:  11/11/2004                                 DISCHARGE SUMMARY   ADMISSION DIAGNOSIS:  Right lower lobe mass.   DISCHARGE/SECONDARY DIAGNOSES:  1.  Non-small cell lung carcinoma involving the right middle and right lower      lobes.  T2 (tT2, tNtMX), status post right middle lobectomy (moderately      differentiated squamous cell carcinoma).  2.  Postoperative alcohol withdrawal.  3.  Chronic obstructive pulmonary disease/obstructive sleep apnea.  4.  Preoperative chronic steroid use (x30 days).  5.  Postoperative leukocytosis, felt most likely secondary to chronic      steroids.  6.  Thrombocytosis with history of splenectomy.  7.  Mild postoperative tachycardia with premature ventricular complexes and      premature atrial contractions, improved.  Digoxin added to regimen of      metoprolol and Cardizem.  8.  Hypertension.  9.  Postoperative hyperglycemia, felt most likely secondary to steroids.  10. Gastroesophageal reflux disease.  11. Hypercholesterolemia.   ALLERGIES:  No known drug allergies.   PROCEDURES:  On October 30, 2004, right video assisted thoracoscopic surgery  with right middle lobe and right lower lobe lobectomies with intercostal  muscle flap and lymph node dissection, right thoracotomy by Dr. Algis Downs. Karle Plumber.   BRIEF HISTORY:  [name] is a 77 year old male who has been followed by Dr.  Drue Second for thrombocytopenia and has undergone a splenectomy.  Pre-  hospitalization platelet count ranges around 600,000.  He had an episode of  hemoptysis approximately two months and had a chest x-ray which showed a  right lower lobe lesion.  Bronchoscopy revealed  non-small cell carcinoma in  the right lower lobe.  His history is significant for tobacco abuse for  approximately 50 years, but quit eight years ago.  He has had a four to six  pound weight loss over the past few months with low-grade hemoptysis.  PET  scan was obtained which showed a 60 x 60 lesion in the right lower lobe.  There was no uptake in the subcarinal, mediastinal, or hilar nodes.  Pulmonary function tests showed a FEV1 of greater than 2, with diffusion  capacity of 60%.  He underwent preoperative cardiac evaluation and there is  a finding of partial positive Cardiolite, and he was seen by Dr. Reyes Ivan, who  said the patient would be a satisfactory candidate for resection of his  right lung tumor, as his coronary artery disease was non-obstructing.  The  patient has been treated for emphysema in the past, but was able to climb on  many stairs before getting short of breath.  He underwent bronchoscopy which  showed the lesion was about 1 cm in the bronchus intermedius, but could  probably be resected with a right lower  lobectomy.  [name] was referred  to Dr. Algis Downs. Karle Plumber for right middle and right lower lobe lobectomies,  and after examination and review of the patient's previous procedures and  findings, he did recommend the right VATS with possible thoracotomy for  right middle and right lower lobectomy.  After discussing risks and  benefits, Mr. Strauch agreed to proceed.   CONSULTATIONS/VISITATIONS:  1.  Pulmonary/critical care medicine, Dr. Danice Goltz.  2.  Physical therapy and occupational therapy.  3.  Case management.  4.  Diabetes treatment team.  5.  Pastoral visit.  6.  Nutritionist.  7.  Speech therapy.  8.  Hematology/oncology, Dr. Drue Second.   HOSPITAL COURSE:  On October 30, 2004, [name] was electively admitted to  [facility] to undergo a right thoracotomy for right middle and  right lower lobectomies as previously discussed.  Two #28  French chest tubes  were placed intraoperatively.  Findings ultimately showed moderately  differentiated squamous cell carcinoma as discussed previously.  Postoperatively, he was initially transferred to the surgical intensive care  unit.  Due to his history of COPD, obstructive sleep apnea, and tobacco use,  pulmonology consult was obtained for incentive spirometry, albuterol, and  Atrovent nebs, and Advair and Spiriva were initiated.  He also remained on  steroids as he had at least a 30 day history of chronic steroid use prior to  his hospitalization.  There was a question of the etiology or necessity of  this, but however, these were continued as it was felt the patient had  become steroid dependent, although the goal was to gradually taper off  steroids if above.  CPAP was restarted for bedtime per his home regimen.  SCD's were also used for DVT prophylaxis.  [name] remained in the  intensive care unit until November 02, 2004.  Both chest tubes had been  discontinued by this time with followup chest x-ray initially showing  approximately 20% right pneumothorax which did gradually improve on its own.  Unfortunately, shortly after [name] was transferred out of the intensive  care unit to step-down unit 3300, he became severely agitated and combative.  Soft wrist restraints as well as sedation was required.  He was transferred  back to the surgical intensive care unit as it was felt that he was  suffering from alcohol withdrawal.  Sedation medication included Haldol,  Ativan, Seroquel, and Klonopin.  At one point it was felt he might even  require re-intubation, but appears was able to use BiPAP.  A Panda tube was  also inserted.  He remained in the surgical intensive care unit until November 06, 2004.  By this time, weaning of his alcohol withdrawal regimen had  begun, and he was more cooperative and less confused.  Once in unit 300, [name] continued to show good progression.  He  was seen by speech for a  bedside swallow evaluation, and felt appropriate to begin clear liquids and  advance as tolerated.  His Panda tube was eventually discontinued, and he  was able to tolerate a regular consistency carbohydrate-modified diet.  No  further speech therapy was felt needed;  however, physical therapy and  occupational therapy were ordered to help him advance in his mobilization.  While he made good progress and was able to ambulate with supervision and  the use of a walker, it was felt that he would require home health physical  therapy and occupational therapy once discharged.  In regards  to the  patient's respiratory status, he did appear to be clinically improving.  Serial chest x-rays showed gradual improvement in his lung volumes and  aeration.  Bilateral atelectasis and small pleural effusions remained  stable.  There was also some evidence of subcutaneous emphysema.  He was  weaned from BiPAP and at rest was generally saturating well on room air, but  with activity, did have some desaturations in the mid 80s.  Subsequently,  home oxygen was arranged at 2 L per nasal cannula with activity.  He was  continued on Advair and Spiriva with nebulizer treatments changed to p.r.n.  only.  His prednisone was tapered down to only 5 mg daily.  Of note, he did  have postoperative leukocytosis of hyperglycemia, both of which were felt to  be related to his chronic steroid use.  No obvious source of infection was  identified, and at the time of this dictation, white count was still  elevated at 18,000, but was down from nearly 23,000.  He did remain  afebrile.  In regards to his hyperglycemia, he was managed with carbohydrate-  modified diet and Novolog insulin sliding scale with sugar's ranging roughly  around 130.  Carbohydrate-modified diet was encouraged after discharge with  close followup with his primary doctor and possibly need for  antihyperglycemic medication if Mr.  Cervi is to remain on steroids  indefinitely.  Of note, Mr. Crotteau also had some postoperative tachycardia  with his heart rate up to 120s.  This was associated with PVC's and PAC's.  This primarily was during the initial phase of his alcohol withdrawal.  This  was treated with increasing his metoprolol to 50 mg t.i.d., continuing his  home regimen of Cardizem, and adding daily digoxin.  As he improved from his  alcohol withdrawal, his heart rate also slowed down and was in the 80s with  occasional PAC's.  In regards to Mr. Pizano diagnosis of non-small cell  lung carcinoma, he was seen once in the hospital by his oncologist, Dr.  Welton Flakes, who felt that the patient would most likely require postoperative  chemotherapy with or without radiation therapy, but would see him in one to  two weeks after discharge to discuss further treatments.   By postoperative day #10, November 09, 2004, Mr. Vonstein was felt nearing readiness for discharge home.  As stated previously, his respiratory status  was improving and his alcohol withdrawal had resolved.  He still showed  signs of deconditioning, but was making progress with mobilization.  He was  tolerating regular diet.  His pain was controlled on oral medication.  His  incisions were healing well without signs of infection.  His white blood  cell count was trending down.  Of note, he had been treated on several days  of Maxipime postoperatively.  He was voiding without difficulty, and renal  function remained good with creatinine of 1.  Platelet counts were stable  for his baseline at 759.  It was felt that if Mr. Pursley makes continued  progress over the next two to three days that he will be ready for discharge  home by postoperative day #12, November 11, 2004.   LABORATORY DATA:  Recent labs at the time of this dictation show a normal B-  natruretic peptide of 64.3.  Sodium of 136, potassium 4.1, chloride 100, CO2  of 26, blood glucose 136, BUN 14,  creatinine 1, calcium 9.1.  White blood  cell count 18,200, hemoglobin 11.2, hematocrit 33.8, platelet count  of 759.  Total bilirubin 0.6, alkaline phosphatase 75, SGOT 26, SGPT 36, total  protein 5.5, blood albumin 2.4.  Digoxin level 0.4.   DISCHARGE MEDICATIONS:  1.  Tylox one to two tablets p.o. q.4h. p.r.n. pain.  2.  Advair discus 250/50 mcg one puff inhaled b.i.d.  3.  Spiriva 18 mcg one dose inhaled daily.  4.  Diltiazem ER 240 mg daily.  5.  Nexium 40 mg daily.  6.  Tanigen (iron supplement) 106 mg daily.  7.  Prednisone 5 mg daily.  8.  Vitamin B1 100 mg daily.  9.  Metoprolol 50 mg b.i.d.  10. Lisinopril 40 mg daily.  11. Multivitamin daily.  12. Digoxin 0.125 mg daily.  13. Home oxygen at 2 L per nasal cannula with activity and at bedtime p.r.n.   DISCHARGE INSTRUCTIONS:  1.  He is instructed to continue carbohydrate-modified diet.  2.  He is to continue daily walking and breathing exercises.  3.  Home health physical therapy and occupational therapy and home health      nursing has been arranged through [facility].  4.  He may shower and clean his incisions gently with mild soap and water.      Staples will be removed prior to discharge.  5.  He is to avoid driving or heavy lifting for the next two weeks.  6.  He should call if he develops fever greater than 101, or increasing      shortness of breath, or redness or drainage from his incision sites.  7.  He is to use CPAP at night per his home regimen.   FOLLOWUP:  1.  He is to follow up with Dr. Algis Downs Karle Plumber at [facility] office on Tuesday,      November 20, 2004, at 4:15 p.m.  He is to have a chest x-ray one hour      before this appointment at [facility] and instructed to bring      his chest x-ray film with him to the [facility] office.  2.  He is to follow up with Dr. Welton Flakes at the [facility] in one to two weeks.  He should call for an appointment. 3.  He is to call and  schedule follow up appointment with his primary      physician, Dr. Nathanial Rancher, in the next few weeks to recheck his blood sugar      and re-evaluate his medication regimen.      Jerold Coombe, P.A.    ______________________________  Ines Bloomer, [name]    AWZ/MEDQ  D:  11/09/2004  T:  11/10/2004  Job:  [id number]   cc:   Drue Second, MD  Fax: [phone]   Danice Goltz, [name] [facility]  [address] [place], [place] [address]   Elmore Guise., [name]  [address]  [place], [place] [address]  Fax: [phone]   Dr. Susie Cassette, [facility]  [facility]

## 2010-08-07 ENCOUNTER — Telehealth: Payer: Self-pay | Admitting: Cardiology

## 2010-08-07 NOTE — Telephone Encounter (Signed)
Pt's wife called She says that Marshun needs a refill on his gout med please call

## 2010-08-07 NOTE — Telephone Encounter (Signed)
Spoke with pt, rx called in, was done yesterday as well by susan.

## 2010-08-10 ENCOUNTER — Other Ambulatory Visit: Payer: Self-pay | Admitting: *Deleted

## 2010-08-10 DIAGNOSIS — F419 Anxiety disorder, unspecified: Secondary | ICD-10-CM

## 2010-08-10 MED ORDER — ALPRAZOLAM 0.5 MG PO TABS: 0.5000 mg | ORAL_TABLET | Freq: Two times a day (BID) | ORAL | Status: DC | PRN

## 2010-08-22 ENCOUNTER — Telehealth: Payer: Self-pay | Admitting: Emergency Medicine

## 2010-08-22 NOTE — Telephone Encounter (Signed)
1 box of each sample given to pt's spouse.

## 2010-09-11 ENCOUNTER — Other Ambulatory Visit: Payer: Self-pay | Admitting: *Deleted

## 2010-09-11 MED ORDER — FUROSEMIDE 20 MG PO TABS: 20.0000 mg | ORAL_TABLET | Freq: Every day | ORAL | Status: DC

## 2010-09-11 NOTE — Telephone Encounter (Signed)
Refilled meds per fax request.  

## 2010-10-11 ENCOUNTER — Ambulatory Visit (INDEPENDENT_AMBULATORY_CARE_PROVIDER_SITE_OTHER): Payer: Medicare Other | Admitting: *Deleted

## 2010-10-11 DIAGNOSIS — Z950 Presence of cardiac pacemaker: Secondary | ICD-10-CM

## 2010-10-11 DIAGNOSIS — I498 Other specified cardiac arrhythmias: Secondary | ICD-10-CM

## 2010-10-12 ENCOUNTER — Other Ambulatory Visit: Payer: Self-pay | Admitting: Internal Medicine

## 2010-10-13 LAB — REMOTE PACEMAKER DEVICE
AL IMPEDENCE PM: 592 Ohm
BATTERY VOLTAGE: 2.99 V
VENTRICULAR PACING PM: 1.09

## 2010-10-16 NOTE — Progress Notes (Signed)
Pacer remote check  

## 2010-10-18 ENCOUNTER — Inpatient Hospital Stay (HOSPITAL_COMMUNITY): Admission: RE | Admit: 2010-10-18 | Payer: Self-pay | Source: Ambulatory Visit

## 2010-10-19 ENCOUNTER — Encounter: Payer: Self-pay | Admitting: *Deleted

## 2010-10-19 ENCOUNTER — Ambulatory Visit (INDEPENDENT_AMBULATORY_CARE_PROVIDER_SITE_OTHER): Payer: Medicare Other | Admitting: Emergency Medicine

## 2010-10-19 ENCOUNTER — Encounter: Payer: Self-pay | Admitting: Emergency Medicine

## 2010-10-19 ENCOUNTER — Other Ambulatory Visit (HOSPITAL_COMMUNITY): Payer: Self-pay | Admitting: Emergency Medicine

## 2010-10-19 DIAGNOSIS — R1310 Dysphagia, unspecified: Secondary | ICD-10-CM

## 2010-10-19 DIAGNOSIS — J4489 Other specified chronic obstructive pulmonary disease: Secondary | ICD-10-CM

## 2010-10-19 DIAGNOSIS — C3490 Malignant neoplasm of unspecified part of unspecified bronchus or lung: Secondary | ICD-10-CM

## 2010-10-19 DIAGNOSIS — J449 Chronic obstructive pulmonary disease, unspecified: Secondary | ICD-10-CM

## 2010-10-19 NOTE — Progress Notes (Signed)
Addended by: Michel Bickers A on: 10/19/2010 04:27 PM   Modules accepted: Orders

## 2010-10-19 NOTE — Assessment & Plan Note (Signed)
Encouraged hime to start taking the symbicort bid Continue spiriva O2 at all times

## 2010-10-19 NOTE — Progress Notes (Signed)
77 yo man, with severe COPD and chronic hypoxemia. He also has a history of non-small cell lung cancer, status post right upper lobectomy and adjuvant chemotherapy in 2006.   Following with Dr Welton Flakes and Edwyna Shell in Sept 10. Wears CPAP every night. Provided by [employer], suposedly entitled to a new one.  March 24, 2009--Presents for an acute office visit. Complains of prod cough with white mucus, increased SOB, wheezing, low grade temp x1 week. finished 5 day course amoxicilin 500mg  bid yesterday. This did not work. Now has lots of coughing with thick mucus. Finish Amox. last night.  Denies chest pain, orthopnea, hemoptysis, fever, n/v/d, edema, headache.   ROV 04/05/09 -- Pt returns for f/u. He was seen by TP 1/7 for an exacerbation in setting URI. He has completed avelox and prednisone. He is doing better, but still with clear nasal drainage and dry cough. Has not required frequent ProAir. Getting back to his usual activity.   ROV 07/25/09 -- f/u for COPD, OSA. Last time we tried stopping Advair to see if it helped his throat irritation. he has not restarted it, throat feels better. He may be having more exertional SOB since stopping the Advair. Tolerates Spiriva.   ROV 10/25/09 -- COPD and OSA. Tells me has been feeling well. Last Ct scan showed no evidence recurrance of his lung CA. Breathing has been OK, the heat has limited his activity. Last time we changed Advair to Symbicort - has helped his irritated throat, has also made his breathing a bit better. Never had to stop his Lisinopril. No new problems, no exacerbations.   ROV 05/04/10 -- Hx COPD and OSA, also hx NSCLCA and RUL lobectomy. Reports that he was treated for AE-COPD with abx and pred. Has been on Spiriva and Symbicort, cut back on the symbicort due to cost. Has been using once daily instead of two times a day. Had the pneumovax 7 yrs ago, shouldn't tneed it again. Having CT scans of the chest followed by Dr Welton Flakes, annually. Wears CPAP  every night.   ROV 10/19/10 -- COPD, OSA, NSCLCA s/p RUL lobectomy. He had been getting CT scans annually, planning to see Dr Welton Flakes. May be having some decreased energy since February. He has been having difficulty with his swallowing - some dysphagia, food getting stuck. Sometimes causes him to get choked. Wears CPAP mask reliably.  Only using Symbicort daily, Spiriva.   Gen: Pleasant, well-nourished, in no distress,  normal affect, tanned skin, on O2  ENT: No lesions,  mouth clear,  oropharynx clear, no postnasal drip  Neck: No JVD, no TMG, no carotid bruits  Lungs: No use of accessory muscles, no dullness to percussion, clear without rales or rhonchi  Cardiovascular: RRR, heart sounds normal, no murmur or gallops, no peripheral edema  Musculoskeletal: No deformities, no cyanosis or clubbing  Neuro: alert, non focal  Skin: Warm, no lesions or rashes   CARCINOMA, LUNG, SQUAMOUS CELL Repeat CT scan per Dr Welton Flakes  CHRONIC OBSTRUCTIVE PULMONARY DISEASE, SEVERE Encouraged hime to start taking the symbicort bid Continue spiriva O2 at all times   Dysphagia Will obtain modified barium swallow to look for aspiration and also obstruction. Will call him with the results, refer to see Dr Juanda Chance if evidence for obstruction.

## 2010-10-19 NOTE — Progress Notes (Signed)
Addended by: Michel Bickers A on: 10/19/2010 05:34 PM   Modules accepted: Orders

## 2010-10-19 NOTE — Patient Instructions (Signed)
Please continue your Spiriva daily Start taking your Symbicort twice a day Wear your oxygen at all times and your CPAP every night We will perform a swallowing evaluation to look for obstruction in your esophagus. We will call you with the results. Call our office if you do not hear from [place].  Follow up with Dr [name] in 6 months or sooner if you have any problems.

## 2010-10-19 NOTE — Assessment & Plan Note (Addendum)
Will obtain modified barium swallow to look for aspiration and also obstruction. Will call him with the results, refer to see Dr Juanda Chance if evidence for obstruction.

## 2010-10-19 NOTE — Assessment & Plan Note (Signed)
Repeat CT scan per Dr Welton Flakes

## 2010-10-22 ENCOUNTER — Other Ambulatory Visit: Payer: Self-pay | Admitting: Emergency Medicine

## 2010-10-24 ENCOUNTER — Ambulatory Visit (HOSPITAL_COMMUNITY)
Admission: RE | Admit: 2010-10-24 | Discharge: 2010-10-24 | Disposition: A | Discharge status: Home / Self Care | Payer: Medicare Other | Source: Ambulatory Visit | Attending: Emergency Medicine | Admitting: Emergency Medicine

## 2010-10-24 ENCOUNTER — Other Ambulatory Visit (HOSPITAL_COMMUNITY): Payer: Medicare Other

## 2010-10-24 ENCOUNTER — Ambulatory Visit (HOSPITAL_COMMUNITY): Payer: Medicare Other

## 2010-10-24 DIAGNOSIS — K222 Esophageal obstruction: Secondary | ICD-10-CM | POA: Insufficient documentation

## 2010-10-24 DIAGNOSIS — R1310 Dysphagia, unspecified: Secondary | ICD-10-CM | POA: Insufficient documentation

## 2010-10-24 DIAGNOSIS — K224 Dyskinesia of esophagus: Secondary | ICD-10-CM | POA: Insufficient documentation

## 2010-10-25 ENCOUNTER — Telehealth: Payer: Self-pay | Admitting: Emergency Medicine

## 2010-10-25 NOTE — Telephone Encounter (Signed)
Pt's wife is requesting results and recs of swallowing test.  Dr. [name], the results are in epic, pls advise.  Thanks!

## 2010-10-26 ENCOUNTER — Ambulatory Visit (HOSPITAL_COMMUNITY): Payer: Medicare Other

## 2010-10-26 ENCOUNTER — Encounter (HOSPITAL_COMMUNITY): Payer: Self-pay

## 2010-10-26 ENCOUNTER — Telehealth: Payer: Self-pay | Admitting: Internal Medicine

## 2010-10-26 ENCOUNTER — Other Ambulatory Visit: Payer: Self-pay | Admitting: Oncology

## 2010-10-26 ENCOUNTER — Encounter: Payer: Medicare Other | Admitting: Oncology

## 2010-10-26 ENCOUNTER — Ambulatory Visit (HOSPITAL_COMMUNITY)
Admission: RE | Admit: 2010-10-26 | Discharge: 2010-10-26 | Disposition: A | Discharge status: Home / Self Care | Payer: Medicare Other | Source: Ambulatory Visit | Attending: Oncology | Admitting: Oncology

## 2010-10-26 DIAGNOSIS — C3490 Malignant neoplasm of unspecified part of unspecified bronchus or lung: Secondary | ICD-10-CM | POA: Insufficient documentation

## 2010-10-26 LAB — CBC WITH DIFFERENTIAL/PLATELET
Basophils Absolute: 0.1 10*3/uL (ref 0.0–0.1)
Eosinophils Absolute: 0.1 10*3/uL (ref 0.0–0.5)
HGB: 13.3 g/dL (ref 13.0–17.1)
MCV: 96.3 fL (ref 79.3–98.0)
MONO#: 1.2 10*3/uL — ABNORMAL HIGH (ref 0.1–0.9)
MONO%: 8.5 % (ref 0.0–14.0)
NEUT#: 10 10*3/uL — ABNORMAL HIGH (ref 1.5–6.5)
RDW: 14.4 % (ref 11.0–14.6)
WBC: 13.8 10*3/uL — ABNORMAL HIGH (ref 4.0–10.3)

## 2010-10-26 LAB — CMP (CANCER CENTER ONLY)
Albumin: 3.2 g/dL — ABNORMAL LOW (ref 3.3–5.5)
Alkaline Phosphatase: 97 U/L — ABNORMAL HIGH (ref 26–84)
BUN, Bld: 23 mg/dL — ABNORMAL HIGH (ref 7–22)
CO2: 29 mEq/L (ref 18–33)
Calcium: 8.7 mg/dL (ref 8.0–10.3)
Chloride: 95 mEq/L — ABNORMAL LOW (ref 98–108)
Glucose, Bld: 119 mg/dL — ABNORMAL HIGH (ref 73–118)
Potassium: 4.4 mEq/L (ref 3.3–4.7)
Total Protein: 6.5 g/dL (ref 6.4–8.1)

## 2010-10-26 MED ORDER — IOHEXOL 300 MG/ML  SOLN
80.0000 mL | Freq: Once | INTRAMUSCULAR | Status: AC | PRN
  Administered 2010-10-26: 80 mL via INTRAVENOUS

## 2010-10-26 NOTE — Telephone Encounter (Signed)
Discussed result w patient - no aspiration, but shows an esophageal web with delayed transit of barium pill. He has seen Dr Juanda Chance before, I will send him to her to discuss the possible options for treatment.

## 2010-10-26 NOTE — Telephone Encounter (Signed)
appt to see NP in [facility] 10/29/10 lmtcb so he can be informed of this appt

## 2010-10-26 NOTE — Telephone Encounter (Signed)
Please advise on results.[name] Stevenson, CMA

## 2010-10-26 NOTE — Telephone Encounter (Signed)
Scheduled patient with Willette Cluster, NP on 10/29/10 at 1:30 PM. Left a message on Libby's voice mail with appointment and to call me if this does not work for patient.

## 2010-10-26 NOTE — Telephone Encounter (Signed)
Pt's wife called again in reference to previous call says she's very concerned can be reached at [phone].Raylene Everts

## 2010-10-29 ENCOUNTER — Ambulatory Visit (INDEPENDENT_AMBULATORY_CARE_PROVIDER_SITE_OTHER): Payer: Medicare Other | Admitting: Nurse Practitioner

## 2010-10-29 ENCOUNTER — Encounter: Payer: Self-pay | Admitting: Nurse Practitioner

## 2010-10-29 DIAGNOSIS — R933 Abnormal findings on diagnostic imaging of other parts of digestive tract: Secondary | ICD-10-CM

## 2010-10-29 DIAGNOSIS — R1310 Dysphagia, unspecified: Secondary | ICD-10-CM

## 2010-10-29 NOTE — Patient Instructions (Signed)
We have scheduled with Dr. Juanda Chance for the Endoscopy on 11-01-2010.  We are contacting Dr. [name] today to get Pulmonary clearance.  Chew your food well before swallowing to avoid a food impaction.

## 2010-10-30 ENCOUNTER — Encounter (HOSPITAL_BASED_OUTPATIENT_CLINIC_OR_DEPARTMENT_OTHER): Payer: Medicare Other | Admitting: Oncology

## 2010-10-30 DIAGNOSIS — C3410 Malignant neoplasm of upper lobe, unspecified bronchus or lung: Secondary | ICD-10-CM

## 2010-10-30 DIAGNOSIS — D72829 Elevated white blood cell count, unspecified: Secondary | ICD-10-CM

## 2010-10-30 DIAGNOSIS — D473 Essential (hemorrhagic) thrombocythemia: Secondary | ICD-10-CM

## 2010-10-31 ENCOUNTER — Encounter: Payer: Self-pay | Admitting: Nurse Practitioner

## 2010-10-31 DIAGNOSIS — R933 Abnormal findings on diagnostic imaging of other parts of digestive tract: Secondary | ICD-10-CM | POA: Insufficient documentation

## 2010-10-31 NOTE — Progress Notes (Signed)
10/31/2010 [name] Stevenson [id number] 02-02-1933   HISTORY OF PRESENT ILLNESS: Patient is a 77 year old male known to Dr. Juanda Chance for a history of diverticulosis and a history of GERD/esophageal stricture (2006). He has had two Nissen fundoplications (remote). Patient was last seen in 2008 at the time of his last colonoscopy done for Hemoccult-positive stool. His next colonoscopy is not due until 2015. Patient referred here by pulmonologist for evaluation of dysphagia and abnormal barium swallow. Patient has had intermittent solid food dysphagia for a few months now. Wife grinds up his food for fear of food impaction.  He is taking daily Nexium, and no significant GERD symptoms. Barium swallow reveals nonspecific esophageal motility disorder and a mucosal ring at the distal esophagus where a barium tablet lodged for 10 minutes. No odynophagia. He hasn't noticed any white coating on tongue to suggest candida.  Patient has history of non-small cell lung cancer, he is status post right upper lobe lobectomy and chemoradiation in 2006. Annual CT scans have been negative for cancer recurrence. He is followed by pulmonary for COPD and is on home oxygen.    Past Medical History  Diagnosis Date  . COPD (chronic obstructive pulmonary disease)   . Diabetes mellitus   . Renal insufficiency   . Cancer     lung  . Stricture and stenosis of esophagus 12/07/2004    EGD done on 12/07/2004  . GERD (gastroesophageal reflux disease)   . Coronary artery disease   . Hypoxemia    Past Surgical History  Procedure Date  . Hernia repair     x2  . Lung lobectomy   . Knee arthroscopy   . Visual merchandiser   . Tonsillectomy     reports that he quit smoking about 14 years ago. His smoking use included Cigarettes. He has a 75 pack-year smoking history. His smokeless tobacco use includes Chew. He reports that he does not drink alcohol or use illicit drugs. family history includes Heart disease in his father and Lung cancer in  his mother. No Known Allergies    Outpatient Encounter Prescriptions as of 10/29/2010  Medication Sig Dispense Refill  . ALBUTEROL IN Inhale into the lungs as needed.        . ALPRAZolam (XANAX) 0.5 MG tablet Take 1 tablet (0.5 mg total) by mouth every 12 (twelve) hours as needed.  40 tablet  5  . aspirin 81 MG tablet Take 81 mg by mouth daily.        Marland Kitchen atorvastatin (LIPITOR) 40 MG tablet Take 40 mg by mouth daily.        . budesonide-formoterol (SYMBICORT) 160-4.5 MCG/ACT inhaler Inhale 2 puffs into the lungs 2 (two) times daily.        . colchicine 0.6 MG tablet Take 1 tablet (0.6 mg total) by mouth daily.  120 tablet  11  . diltiazem (TIAZAC) 240 MG 24 hr capsule Take 240 mg by mouth daily.        Marland Kitchen esomeprazole (NEXIUM) 40 MG capsule Take 40 mg by mouth daily before breakfast.        . ezetimibe (ZETIA) 10 MG tablet Take 10 mg by mouth daily.        . fish oil-omega-3 fatty acids 1000 MG capsule Take 2 g by mouth daily.        . Fluticasone-Salmeterol (ADVAIR DISKUS) 500-50 MCG/DOSE AEPB Inhale 1 puff into the lungs every 12 (twelve) hours.        . furosemide (LASIX) 20 MG  tablet Take 1 tablet (20 mg total) by mouth daily.  30 tablet  11  . hydrALAZINE (APRESOLINE) 25 MG tablet Take 25 mg by mouth 2 (two) times daily.       Marland Kitchen HYDROcodone-acetaminophen (VICODIN) 5-500 MG per tablet Take 1 tablet by mouth every 6 (six) hours as needed.        Jodelle Green Aspart-Potassium Aspart 250-250 MG TABS Take 1 tablet by mouth 2 (two) times daily.        . Magnesium Oxide (DIASENSE MAGNESIUM) 400 (241.3 MG) MG TABS Take by mouth.        . metFORMIN (GLUCOPHAGE) 500 MG tablet Take 1 tablet by mouth Daily.      . metoprolol tartrate (LOPRESSOR) 12.5 mg TABS Take 50 mg by mouth 2 (two) times daily.       . Multiple Vitamins-Minerals (EYE VITAMINS) TABS Take 1 tablet by mouth 2 (two) times daily.        . multivitamin (THERAGRAN) per tablet Take 1 tablet by mouth daily.        . Naproxen Sodium (ALEVE PO)  Take by mouth 2 (two) times daily.        . Probiotic Product (RESTORA PO) Take by mouth at bedtime.        . Tiotropium Bromide Monohydrate (SPIRIVA HANDIHALER IN) Inhale into the lungs.        . vitamin B-12 (CYANOCOBALAMIN) 100 MCG tablet Take 50 mcg by mouth daily.        . vitamin E 600 UNIT capsule Take 600 Units by mouth 2 (two) times daily.           REVIEW OF SYSTEMS  : All other systems reviewed and negative except where noted in the History of Present Illness.   PHYSICAL EXAM: General: Well developed white male in no acute distress Head: Normocephalic and atraumatic Eyes:  sclerae anicteric,conjunctive pink. Ears: Normal auditory acuity Mouth: No deformity or lesions Neck: Supple, no masses.  Lungs: Clear throughout to auscultation. Oxygen via nasal cannula. Heart: Regular rate and rhythm;  Abdomen: Soft, non distended, nontender. No masses or hepatomegaly noted. Normal Bowel sounds Rectal: Not done Musculoskeletal: Symmetrical with no gross deformities  Skin: No lesions on visible extremities Extremities: No edema or deformities noted Neurological: Alert oriented x 4, grossly nonfocal Cervical Nodes:  No significant cervical adenopathy Psychological:  Alert and cooperative. Normal mood and affect  ASSESSMENT AND PLAN;

## 2010-10-31 NOTE — Assessment & Plan Note (Addendum)
Solid food dysphagia.  Barium swallow revealed distal esophageal mucosal ring holding up a barium tablet for 10 minutes.  He also has nonspecific esophageal dysmotility. For further evaluation the patient will be scheduled for an upper endoscopy with probable dilation.The benefits, risks, and potential complications of EGD with possible biopsies and/or dilation were discussed with the patient and he agrees to proceed. Patient doesn't take narcotics on a regular basis and only takes one Xanax a day so sedating him should not be difficult. Of note, he tolerated colonoscopy under moderate sedation in 2008 at [facility].

## 2010-11-01 ENCOUNTER — Ambulatory Visit (AMBULATORY_SURGERY_CENTER): Payer: Medicare Other | Admitting: Internal Medicine

## 2010-11-01 ENCOUNTER — Encounter: Payer: Self-pay | Admitting: Internal Medicine

## 2010-11-01 DIAGNOSIS — R933 Abnormal findings on diagnostic imaging of other parts of digestive tract: Secondary | ICD-10-CM

## 2010-11-01 DIAGNOSIS — R1310 Dysphagia, unspecified: Secondary | ICD-10-CM

## 2010-11-01 DIAGNOSIS — K219 Gastro-esophageal reflux disease without esophagitis: Secondary | ICD-10-CM

## 2010-11-01 DIAGNOSIS — K222 Esophageal obstruction: Secondary | ICD-10-CM

## 2010-11-01 LAB — GLUCOSE, CAPILLARY: Glucose-Capillary: 118 mg/dL — ABNORMAL HIGH (ref 70–99)

## 2010-11-01 MED ORDER — OMEPRAZOLE 20 MG PO CPDR: 20.0000 mg | DELAYED_RELEASE_CAPSULE | Freq: Every day | ORAL | Status: DC

## 2010-11-01 MED ORDER — SODIUM CHLORIDE 0.9 % IV SOLN: 500.0000 mL | INTRAVENOUS | Status: DC

## 2010-11-01 NOTE — Progress Notes (Signed)
Reviewed and agree, suspect es. Stricture.

## 2010-11-01 NOTE — Patient Instructions (Signed)
Discharge instructions gone over with patient and caregiver. Handouts on diet given to patient/caregiver. You will receive a phone call in the morning from one of our nurses to follow- up.

## 2010-11-02 ENCOUNTER — Telehealth: Payer: Self-pay | Admitting: *Deleted

## 2010-11-02 NOTE — Telephone Encounter (Signed)

## 2010-11-22 ENCOUNTER — Other Ambulatory Visit: Payer: Self-pay | Admitting: Cardiology

## 2010-11-22 DIAGNOSIS — E119 Type 2 diabetes mellitus without complications: Secondary | ICD-10-CM

## 2010-11-22 MED ORDER — METFORMIN HCL 500 MG PO TABS: 500.0000 mg | ORAL_TABLET | Freq: Every day | ORAL | Status: DC

## 2010-11-22 NOTE — Telephone Encounter (Signed)
Refilled metformin

## 2010-11-22 NOTE — Telephone Encounter (Signed)
Wife called back to discuss why he couldn't have this refilled.  Please call back to speak with Mrs. Rump.

## 2010-11-22 NOTE — Telephone Encounter (Signed)
Called needing refills of her husbands metFORMIN (GLUCOPHAGE) 500 MG tablet and his sleeping medicine. Please call back. I could not find his chart.

## 2010-11-27 ENCOUNTER — Other Ambulatory Visit: Payer: Self-pay | Admitting: *Deleted

## 2010-11-27 DIAGNOSIS — G4700 Insomnia, unspecified: Secondary | ICD-10-CM

## 2010-11-27 MED ORDER — TEMAZEPAM 30 MG PO CAPS: 30.0000 mg | ORAL_CAPSULE | Freq: Every evening | ORAL | Status: DC | PRN

## 2010-11-27 NOTE — Telephone Encounter (Signed)
Refilled meds per fax request. Faxed signed Rx back 

## 2010-11-28 ENCOUNTER — Other Ambulatory Visit: Payer: Self-pay | Admitting: *Deleted

## 2010-11-28 DIAGNOSIS — I119 Hypertensive heart disease without heart failure: Secondary | ICD-10-CM

## 2010-11-28 MED ORDER — HYDRALAZINE HCL 25 MG PO TABS: 25.0000 mg | ORAL_TABLET | Freq: Two times a day (BID) | ORAL | Status: DC

## 2010-11-28 NOTE — Telephone Encounter (Signed)
Refilled meds per fax request.  

## 2010-12-07 ENCOUNTER — Encounter: Payer: Self-pay | Admitting: Cardiology

## 2010-12-11 ENCOUNTER — Telehealth: Payer: Self-pay | Admitting: Emergency Medicine

## 2010-12-11 NOTE — Telephone Encounter (Signed)
1 box of each sample left up front. Spoke with pt's spouse and notified.

## 2010-12-21 ENCOUNTER — Encounter: Payer: Self-pay | Admitting: Cardiology

## 2010-12-21 ENCOUNTER — Ambulatory Visit (INDEPENDENT_AMBULATORY_CARE_PROVIDER_SITE_OTHER): Payer: Medicare Other | Admitting: Cardiology

## 2010-12-21 VITALS — BP 134/78 | HR 78 | Ht 67.0 in | Wt 209.0 lb

## 2010-12-21 DIAGNOSIS — Z950 Presence of cardiac pacemaker: Secondary | ICD-10-CM

## 2010-12-21 DIAGNOSIS — F419 Anxiety disorder, unspecified: Secondary | ICD-10-CM

## 2010-12-21 DIAGNOSIS — I119 Hypertensive heart disease without heart failure: Secondary | ICD-10-CM

## 2010-12-21 DIAGNOSIS — R52 Pain, unspecified: Secondary | ICD-10-CM

## 2010-12-21 DIAGNOSIS — R1310 Dysphagia, unspecified: Secondary | ICD-10-CM

## 2010-12-21 DIAGNOSIS — E7800 Pure hypercholesterolemia, unspecified: Secondary | ICD-10-CM

## 2010-12-21 DIAGNOSIS — J449 Chronic obstructive pulmonary disease, unspecified: Secondary | ICD-10-CM

## 2010-12-21 DIAGNOSIS — J4489 Other specified chronic obstructive pulmonary disease: Secondary | ICD-10-CM

## 2010-12-21 MED ORDER — ALPRAZOLAM 0.5 MG PO TABS: 0.5000 mg | ORAL_TABLET | Freq: Three times a day (TID) | ORAL | Status: DC | PRN

## 2010-12-21 MED ORDER — ATORVASTATIN CALCIUM 40 MG PO TABS: 40.0000 mg | ORAL_TABLET | Freq: Every day | ORAL | Status: DC

## 2010-12-21 MED ORDER — HYDROCODONE-ACETAMINOPHEN 5-500 MG PO TABS: 1.0000 | ORAL_TABLET | Freq: Three times a day (TID) | ORAL | Status: DC | PRN

## 2010-12-21 NOTE — Assessment & Plan Note (Signed)
The patient had been having problems swallowing.  Several months ago.  He underwent esophageal dilatation by Dr. [name] and he reports that since then.  He has been swallowing without difficulty and his appetite is good.

## 2010-12-21 NOTE — Assessment & Plan Note (Signed)
Patient has a functioning dual-chamber pacemaker.  This is followed in the lower pacemaker clinic.  The pacemaker was placed by Dr. Deborah Chalk, 02/07/09.  Patient is not aware of any recent palpitations or tachycardia.

## 2010-12-21 NOTE — Progress Notes (Signed)
[name] Stevenson Date of Birth:  1933/02/11 [facility] / [facility] / [facility] [address]   [address] [place], [place]  [address] [phone]           Fax   [phone]  History of Present Illness: This pleasant 77 year old gentleman is seen for a scheduled followup office visit.  He has a complex past medical history.  He has a history of coronary atherosclerotic heart disease with previous cardiac catheterization showing nonobstructive coronary artery disease.  Patient has a past history of sick sinus syndrome with tachybradycardia syndrome and has a functioning dual-chamber pacemaker.  Patient has a past history of lung cancer, treated by Dr. Edwyna Shell is no evidence of recurrence.  The patient is on oxygen at home 24/7.  Since last visit his symptoms have remained stable.  He has not been expressing any chest pain or increasing shortness of breath.  Is not having any paroxysmal nocturnal dyspnea.  Current Outpatient Prescriptions  Medication Sig Dispense Refill  . ALBUTEROL IN Inhale into the lungs as needed.        . ALPRAZolam (XANAX) 0.5 MG tablet Take 1 tablet (0.5 mg total) by mouth every 12 (twelve) hours as needed.  40 tablet  5  . aspirin 81 MG tablet Take 81 mg by mouth daily.        Marland Kitchen atorvastatin (LIPITOR) 40 MG tablet Take 40 mg by mouth daily.        . budesonide-formoterol (SYMBICORT) 160-4.5 MCG/ACT inhaler Inhale 2 puffs into the lungs 2 (two) times daily.        . colchicine 0.6 MG tablet Take 1 tablet (0.6 mg total) by mouth daily.  120 tablet  11  . diltiazem (TIAZAC) 240 MG 24 hr capsule Take 240 mg by mouth daily.        Marland Kitchen esomeprazole (NEXIUM) 40 MG capsule Take 40 mg by mouth daily before breakfast.        . fish oil-omega-3 fatty acids 1000 MG capsule Take 2 g by mouth daily.        . Fluticasone-Salmeterol (ADVAIR DISKUS) 500-50 MCG/DOSE AEPB Inhale 1 puff into the lungs every 12 (twelve) hours.        . furosemide (LASIX) 20 MG tablet Take 1 tablet (20 mg  total) by mouth daily.  30 tablet  11  . hydrALAZINE (APRESOLINE) 25 MG tablet Take 1 tablet (25 mg total) by mouth 2 (two) times daily.  180 tablet  3  . HYDROcodone-acetaminophen (VICODIN) 5-500 MG per tablet Take 1 tablet by mouth every 6 (six) hours as needed.       Jodelle Green Aspart-Potassium Aspart 250-250 MG TABS Take 1 tablet by mouth 2 (two) times daily.        . Magnesium Oxide (DIASENSE MAGNESIUM) 400 (241.3 MG) MG TABS Take by mouth.        . metFORMIN (GLUCOPHAGE) 500 MG tablet Take 1 tablet (500 mg total) by mouth daily.  120 tablet  prn  . metoprolol tartrate (LOPRESSOR) 12.5 mg TABS Take 50 mg by mouth 2 (two) times daily.       . Multiple Vitamins-Minerals (EYE VITAMINS) TABS Take 1 tablet by mouth 2 (two) times daily.        . multivitamin (THERAGRAN) per tablet Take 1 tablet by mouth daily.        . Naproxen Sodium (ALEVE PO) Take by mouth 2 (two) times daily.        Marland Kitchen omeprazole (PRILOSEC) 20 MG  capsule Take 1 capsule (20 mg total) by mouth daily.  30 capsule  6  . Probiotic Product (RESTORA PO) Take by mouth at bedtime.        . temazepam (RESTORIL) 30 MG capsule Take 1 capsule (30 mg total) by mouth at bedtime as needed for sleep.  30 capsule  3  . Tiotropium Bromide Monohydrate (SPIRIVA HANDIHALER IN) Inhale into the lungs.        . vitamin B-12 (CYANOCOBALAMIN) 100 MCG tablet Take 50 mcg by mouth daily.        . vitamin E 600 UNIT capsule Take 600 Units by mouth 2 (two) times daily.          No Known Allergies  Patient Active Problem List  Diagnoses  . CARCINOMA, LUNG, SQUAMOUS CELL  . CORONARY ARTERY DISEASE  . CHRONIC OBSTRUCTIVE PULMONARY DISEASE, SEVERE  . GERD  . HYPERSOMNIA WITH SLEEP APNEA UNSPECIFIED  . HYPOXEMIA  . Pacemaker  . Hypercholesterolemia  . Hyperuricemia  . Benign hypertensive heart disease without heart failure  . Dysphagia  . Nonspecific (abnormal) findings on radiological and other examination of gastrointestinal tract    History  Smoking  status  . Former Smoker -- 1.5 packs/day for 50 years  . Types: Cigarettes  . Quit date: 06/28/1996  Smokeless tobacco  . Current User  . Types: Chew    History  Alcohol Use No    Family History  Problem Relation Age of Onset  . Heart disease Father   . Lung cancer Mother     Review of Systems: Constitutional: no fever chills diaphoresis or fatigue or change in weight.  Head and neck: no hearing loss, no epistaxis, no photophobia or visual disturbance. Respiratory: No cough, shortness of breath or wheezing. Cardiovascular: No chest pain peripheral edema, palpitations. Gastrointestinal: No abdominal distention, no abdominal pain, no change in bowel habits hematochezia or melena. Genitourinary: No dysuria, no frequency, no urgency, no nocturia. Musculoskeletal:No arthralgias, no back pain, no gait disturbance or myalgias. Neurological: No dizziness, no headaches, no numbness, no seizures, no syncope, no weakness, no tremors. Hematologic: No lymphadenopathy, no easy bruising. Psychiatric: No confusion, no hallucinations, no sleep disturbance.    Physical Exam: Filed Vitals:   12/21/10 1404  BP: 134/78  Pulse: 78   General appearance reveals a well-developed, well-nourished, elderly gentleman in no distress.  He has nasal oxygen.Pupils equal and reactive.   Extraocular Movements are full.  There is no scleral icterus.  The mouth and pharynx are normal.  The neck is supple.  The carotids reveal no bruits.  The jugular venous pressure is normal.  The thyroid is not enlarged.  There is no lymphadenopathy.  The chest is clear to percussion and auscultation. There are no rales or rhonchi. Expansion of the chest is symmetrical.  He has a barrel chest consistent with COPD.  No rales.The precordium is quiet.  The first heart sound is normal.  The second heart sound is physiologically split.  There is no murmur gallop rub or click.  There is no abnormal lift or heave.  The abdomen is  soft and nontender. Bowel sounds are normal. The liver and spleen are not enlarged. There Are no abdominal masses. There are no bruits.  The pedal pulses are good.  There is no phlebitis or edema.  There is no cyanosis or clubbing. Strength is normal and symmetrical in all extremities.  There is no lateralizing weakness.  There are no sensory deficits.  The skin is  warm and dry.  There is no rash.     Assessment / Plan:  Recheck in 6 months for followup office visit in fasting lab work.  Continue same medication.

## 2010-12-21 NOTE — Patient Instructions (Signed)
Your physician wants you to follow-up in: 6 month You will receive a reminder letter in the mail two months in advance. If you don't receive a letter, please call our office to schedule the follow-up appointment.  

## 2010-12-27 LAB — CBC
HCT: 24.3 — ABNORMAL LOW
HCT: 26.9 — ABNORMAL LOW
HCT: 27 — ABNORMAL LOW
HCT: 28.5 — ABNORMAL LOW
HCT: 29.5 — ABNORMAL LOW
HCT: 29.9 — ABNORMAL LOW
HCT: 29.9 — ABNORMAL LOW
HCT: 27.9 — ABNORMAL LOW
HCT: 29.4 — ABNORMAL LOW
HCT: 34.4 — ABNORMAL LOW
HCT: 35.4 — ABNORMAL LOW
HCT: 39.2
Hemoglobin: 10 — ABNORMAL LOW
Hemoglobin: 10 — ABNORMAL LOW
Hemoglobin: 10.1 — ABNORMAL LOW
Hemoglobin: 8.1 — ABNORMAL LOW
Hemoglobin: 9.2 — ABNORMAL LOW
Hemoglobin: 9.8 — ABNORMAL LOW
Hemoglobin: 11.8 — ABNORMAL LOW
Hemoglobin: 12.9 — ABNORMAL LOW
Hemoglobin: 9 — ABNORMAL LOW
Hemoglobin: 9.4 — ABNORMAL LOW
MCHC: 33.2
MCHC: 33.5
MCHC: 33.5
MCHC: 33.7
MCHC: 34.3
MCHC: 33.1
MCHC: 33.1
MCHC: 33.2
MCHC: 33.3
MCHC: 33.4
MCHC: 33.9
MCHC: 33.9
MCV: 90.4
MCV: 90.5
MCV: 90.6
MCV: 90.8
MCV: 91.4
MCV: 91.5
MCV: 91.5
MCV: 92.1
MCV: 92.2
MCV: 90.9
MCV: 91.3
MCV: 91.5
MCV: 92.2
Platelets: 246
Platelets: 385
Platelets: 444 — ABNORMAL HIGH
Platelets: 246
Platelets: 256
Platelets: 260
Platelets: 268
Platelets: 355
RBC: 3.15 — ABNORMAL LOW
RBC: 3.21 — ABNORMAL LOW
RBC: 3.24 — ABNORMAL LOW
RBC: 3.26 — ABNORMAL LOW
RBC: 3.29 — ABNORMAL LOW
RBC: 3.3 — ABNORMAL LOW
RBC: 3.49 — ABNORMAL LOW
RBC: 3.77 — ABNORMAL LOW
RBC: 3.82 — ABNORMAL LOW
RDW: 15.4 — ABNORMAL HIGH
RDW: 15.6 — ABNORMAL HIGH
RDW: 15.7 — ABNORMAL HIGH
RDW: 15.1 — ABNORMAL HIGH
RDW: 15.3 — ABNORMAL HIGH
RDW: 15.3 — ABNORMAL HIGH
RDW: 15.4 — ABNORMAL HIGH
RDW: 15.5 — ABNORMAL HIGH
RDW: 15.6 — ABNORMAL HIGH
RDW: 15.8 — ABNORMAL HIGH
WBC: 19.5 — ABNORMAL HIGH
WBC: 20.3 — ABNORMAL HIGH
WBC: 25.7 — ABNORMAL HIGH
WBC: 32.2 — ABNORMAL HIGH
WBC: 34.5 — ABNORMAL HIGH
WBC: 36.8 — ABNORMAL HIGH
WBC: 40.1 — ABNORMAL HIGH

## 2010-12-27 LAB — CROSSMATCH: Antibody Screen: NEGATIVE

## 2010-12-27 LAB — COMPREHENSIVE METABOLIC PANEL
ALT: 19
AST: 24
AST: 24
Albumin: 2.3 — ABNORMAL LOW
Albumin: 2.2 — ABNORMAL LOW
Albumin: 3 — ABNORMAL LOW
Alkaline Phosphatase: 60
Alkaline Phosphatase: 63
BUN: 65 — ABNORMAL HIGH
BUN: 67 — ABNORMAL HIGH
BUN: 81 — ABNORMAL HIGH
Calcium: 8.4
Chloride: 106
Chloride: 88 — ABNORMAL LOW
Creatinine, Ser: 4.66 — ABNORMAL HIGH
GFR calc Af Amer: 17 — ABNORMAL LOW
GFR calc Af Amer: 15 — ABNORMAL LOW
Potassium: 3.3 — ABNORMAL LOW
Potassium: 3.7
Sodium: 127 — ABNORMAL LOW
Total Bilirubin: 1.2
Total Protein: 4.9 — ABNORMAL LOW
Total Protein: 4.9 — ABNORMAL LOW

## 2010-12-27 LAB — DIFFERENTIAL
Basophils Absolute: 0
Basophils Absolute: 0.1
Basophils Relative: 0
Eosinophils Absolute: 0
Eosinophils Absolute: 0
Eosinophils Relative: 0
Lymphs Abs: 2
Monocytes Absolute: 0.8 — ABNORMAL HIGH
Monocytes Absolute: 2 — ABNORMAL HIGH
Neutro Abs: 11 — ABNORMAL HIGH
Neutro Abs: 28.6 — ABNORMAL HIGH

## 2010-12-27 LAB — EXPECTORATED SPUTUM ASSESSMENT W GRAM STAIN, RFLX TO RESP C

## 2010-12-27 LAB — B-NATRIURETIC PEPTIDE (CONVERTED LAB)
Pro B Natriuretic peptide (BNP): 230 — ABNORMAL HIGH
Pro B Natriuretic peptide (BNP): 249 — ABNORMAL HIGH
Pro B Natriuretic peptide (BNP): <30

## 2010-12-27 LAB — POCT I-STAT 3, ART BLOOD GAS (G3+)
Acid-Base Excess: 7 — ABNORMAL HIGH
Acid-Base Excess: 9 — ABNORMAL HIGH
Bicarbonate: 30.8 — ABNORMAL HIGH
Bicarbonate: 32.5 — ABNORMAL HIGH
O2 Saturation: 94
O2 Saturation: 95
O2 Saturation: 98
Operator id: 257001
Operator id: 270091
Operator id: 270091
Patient temperature: 98.3
Patient temperature: 98.7
TCO2: 30
TCO2: 31
TCO2: 32
TCO2: 34
pCO2 arterial: 36
pCO2 arterial: 36.6
pCO2 arterial: 36.8
pH, Arterial: 7.369
pH, Arterial: 7.439
pH, Arterial: 7.538 — ABNORMAL HIGH
pH, Arterial: 7.556 — ABNORMAL HIGH
pO2, Arterial: 87

## 2010-12-27 LAB — BASIC METABOLIC PANEL
BUN: 23
BUN: 24 — ABNORMAL HIGH
BUN: 28 — ABNORMAL HIGH
BUN: 31 — ABNORMAL HIGH
BUN: 31 — ABNORMAL HIGH
BUN: 104 — ABNORMAL HIGH
BUN: 63 — ABNORMAL HIGH
BUN: 65 — ABNORMAL HIGH
BUN: 66 — ABNORMAL HIGH
BUN: 84 — ABNORMAL HIGH
BUN: 92 — ABNORMAL HIGH
CO2: 20
CO2: 22
CO2: 23
CO2: 23
CO2: 24
CO2: 13 — ABNORMAL LOW
CO2: 16 — ABNORMAL LOW
CO2: 27
CO2: 31
Calcium: 8 — ABNORMAL LOW
Calcium: 8.2 — ABNORMAL LOW
Calcium: 8.4
Calcium: 7.8 — ABNORMAL LOW
Calcium: 7.8 — ABNORMAL LOW
Calcium: 7.9 — ABNORMAL LOW
Calcium: 8 — ABNORMAL LOW
Chloride: 103
Chloride: 106
Chloride: 107
Chloride: 108
Chloride: 108
Chloride: 112
Chloride: 102
Creatinine, Ser: 1.4
Creatinine, Ser: 1.57 — ABNORMAL HIGH
Creatinine, Ser: 1.74 — ABNORMAL HIGH
Creatinine, Ser: 2.09 — ABNORMAL HIGH
Creatinine, Ser: 2.38 — ABNORMAL HIGH
Creatinine, Ser: 4.01 — ABNORMAL HIGH
Creatinine, Ser: 4.41 — ABNORMAL HIGH
Creatinine, Ser: 5.04 — ABNORMAL HIGH
Creatinine, Ser: 6.97 — ABNORMAL HIGH
GFR calc Af Amer: 30 — ABNORMAL LOW
GFR calc Af Amer: 33 — ABNORMAL LOW
GFR calc Af Amer: 38 — ABNORMAL LOW
GFR calc Af Amer: 53 — ABNORMAL LOW
GFR calc Af Amer: 14 — ABNORMAL LOW
GFR calc Af Amer: 18 — ABNORMAL LOW
GFR calc Af Amer: 9 — ABNORMAL LOW
GFR calc non Af Amer: 16 — ABNORMAL LOW
GFR calc non Af Amer: 39 — ABNORMAL LOW
GFR calc non Af Amer: 11 — ABNORMAL LOW
GFR calc non Af Amer: 13 — ABNORMAL LOW
GFR calc non Af Amer: 13 — ABNORMAL LOW
GFR calc non Af Amer: 13 — ABNORMAL LOW
GFR calc non Af Amer: 7 — ABNORMAL LOW
Glucose, Bld: 150 — ABNORMAL HIGH
Glucose, Bld: 79
Glucose, Bld: 96
Glucose, Bld: 117 — ABNORMAL HIGH
Glucose, Bld: 120 — ABNORMAL HIGH
Glucose, Bld: 176 — ABNORMAL HIGH
Glucose, Bld: 243 — ABNORMAL HIGH
Potassium: 3.4 — ABNORMAL LOW
Potassium: 4.2
Potassium: 4.7
Potassium: 3.5
Potassium: 3.8
Sodium: 145
Sodium: 147 — ABNORMAL HIGH

## 2010-12-27 LAB — CULTURE, BLOOD (ROUTINE X 2)
Culture: NO GROWTH
Culture: NO GROWTH
Culture: NO GROWTH

## 2010-12-27 LAB — CLOSTRIDIUM DIFFICILE EIA
C difficile Toxins A+B, EIA: NEGATIVE
C difficile Toxins A+B, EIA: NEGATIVE

## 2010-12-27 LAB — PROTEIN ELECTROPH W RFLX QUANT IMMUNOGLOBULINS
Albumin ELP: 46 — ABNORMAL LOW
Alpha-1-Globulin: 11.7 — ABNORMAL HIGH
Beta 2: 6.2
Total Protein ELP: 5.9 — ABNORMAL LOW

## 2010-12-27 LAB — STOOL CULTURE

## 2010-12-27 LAB — RENAL FUNCTION PANEL
BUN: 46 — ABNORMAL HIGH
CO2: 25
CO2: 29
CO2: 14 — ABNORMAL LOW
Calcium: 7.8 — ABNORMAL LOW
Calcium: 8 — ABNORMAL LOW
Chloride: 102
Chloride: 101
Creatinine, Ser: 3.26 — ABNORMAL HIGH
Creatinine, Ser: 3.41 — ABNORMAL HIGH
Creatinine, Ser: 6.74 — ABNORMAL HIGH
GFR calc Af Amer: 23 — ABNORMAL LOW
GFR calc non Af Amer: 19 — ABNORMAL LOW
Glucose, Bld: 153 — ABNORMAL HIGH
Glucose, Bld: 167 — ABNORMAL HIGH
Glucose, Bld: 180 — ABNORMAL HIGH
Phosphorus: 3.5
Potassium: 3.8
Sodium: 133 — ABNORMAL LOW
Sodium: 141

## 2010-12-27 LAB — HEPATITIS B SURFACE ANTIBODY,QUALITATIVE: Hep B S Ab: NEGATIVE

## 2010-12-27 LAB — URINALYSIS, ROUTINE W REFLEX MICROSCOPIC
Glucose, UA: NEGATIVE
Ketones, ur: NEGATIVE
Ketones, ur: 15 — AB
Nitrite: NEGATIVE
Protein, ur: 100 — AB
Protein, ur: 30 — AB

## 2010-12-27 LAB — CARDIAC PANEL(CRET KIN+CKTOT+MB+TROPI)
CK, MB: 3.5
Relative Index: INVALID
Total CK: 40
Total CK: 58

## 2010-12-27 LAB — BLOOD GAS, ARTERIAL
Acid-base deficit: 14.9 — ABNORMAL HIGH
Drawn by: 246861
O2 Content: 2.5
pCO2 arterial: 27.5 — ABNORMAL LOW
pH, Arterial: 7.231 — ABNORMAL LOW
pO2, Arterial: 149 — ABNORMAL HIGH

## 2010-12-27 LAB — HEPATIC FUNCTION PANEL
AST: 30
Albumin: 2.6 — ABNORMAL LOW
Alkaline Phosphatase: 99
Bilirubin, Direct: 0.1
Total Bilirubin: 0.8

## 2010-12-27 LAB — MAGNESIUM: Magnesium: 1.8

## 2010-12-27 LAB — AMYLASE: Amylase: 69

## 2010-12-27 LAB — HEPARIN LEVEL (UNFRACTIONATED)
Heparin Unfractionated: 0.86 — ABNORMAL HIGH
Heparin Unfractionated: >2 — ABNORMAL HIGH

## 2010-12-27 LAB — LEGIONELLA ANTIGEN, URINE: Legionella Antigen, Urine: NEGATIVE

## 2010-12-27 LAB — PHOSPHORUS: Phosphorus: 3.6

## 2010-12-27 LAB — DIC (DISSEMINATED INTRAVASCULAR COAGULATION)PANEL
D-Dimer, Quant: 2.1 — ABNORMAL HIGH
Fibrinogen: 613 — ABNORMAL HIGH
INR: 1.3
Platelets: 265
Prothrombin Time: 16.2 — ABNORMAL HIGH

## 2010-12-27 LAB — IGG, IGA, IGM: IgM, Serum: 17 — ABNORMAL LOW

## 2010-12-27 LAB — CK TOTAL AND CKMB (NOT AT ARMC): CK, MB: 2.9

## 2010-12-27 LAB — URINE CULTURE
Colony Count: 75000
Special Requests: NEGATIVE

## 2010-12-27 LAB — FECAL LACTOFERRIN, QUANT

## 2010-12-27 LAB — IMMUNOFIXATION ADD-ON

## 2010-12-27 LAB — CREATININE, URINE, RANDOM: Creatinine, Urine: 172.3

## 2010-12-27 LAB — LIPASE, BLOOD: Lipase: 31

## 2010-12-27 LAB — SODIUM, URINE, RANDOM: Sodium, Ur: 25

## 2011-01-07 ENCOUNTER — Telehealth: Payer: Self-pay | Admitting: Emergency Medicine

## 2011-01-07 NOTE — Telephone Encounter (Signed)
I gave pt wife 1 sample of symbicort and spiriva and nothing further was needed

## 2011-01-10 ENCOUNTER — Ambulatory Visit (INDEPENDENT_AMBULATORY_CARE_PROVIDER_SITE_OTHER): Payer: Medicare Other | Admitting: *Deleted

## 2011-01-10 ENCOUNTER — Telehealth: Payer: Self-pay | Admitting: Cardiology

## 2011-01-10 DIAGNOSIS — I498 Other specified cardiac arrhythmias: Secondary | ICD-10-CM

## 2011-01-10 DIAGNOSIS — Z950 Presence of cardiac pacemaker: Secondary | ICD-10-CM

## 2011-01-10 NOTE — Telephone Encounter (Signed)
Pt wife calling for pt to get pneumonia shot at [facility] in [place]. Please call back.

## 2011-01-10 NOTE — Telephone Encounter (Signed)
Called in as requested.  Did double check with wife and last vaccine was in 2005

## 2011-01-14 ENCOUNTER — Encounter: Payer: Self-pay | Admitting: Internal Medicine

## 2011-01-14 ENCOUNTER — Other Ambulatory Visit: Payer: Self-pay | Admitting: Internal Medicine

## 2011-01-15 LAB — REMOTE PACEMAKER DEVICE
AL AMPLITUDE: 2.1 mv
AL IMPEDENCE PM: 544 Ohm
ATRIAL PACING PM: 94.9
BATTERY VOLTAGE: 3 V
RV LEAD IMPEDENCE PM: 496 Ohm
VENTRICULAR PACING PM: 2.51

## 2011-01-16 ENCOUNTER — Other Ambulatory Visit: Payer: Self-pay | Admitting: Emergency Medicine

## 2011-01-16 MED ORDER — TIOTROPIUM BROMIDE MONOHYDRATE 18 MCG IN CAPS: 18.0000 ug | ORAL_CAPSULE | Freq: Every day | RESPIRATORY_TRACT | Status: DC

## 2011-01-17 ENCOUNTER — Telehealth: Payer: Self-pay | Admitting: Cardiology

## 2011-01-17 NOTE — Telephone Encounter (Signed)
Spoke with wife to let know transmission was received.

## 2011-01-17 NOTE — Telephone Encounter (Signed)
Pt wife calling wanting to check and see if pt device check was received. Please return pt wife call to discuss further.

## 2011-01-18 NOTE — Progress Notes (Signed)
Pacer remote check  

## 2011-02-04 ENCOUNTER — Other Ambulatory Visit: Payer: Self-pay | Admitting: Cardiology

## 2011-02-04 DIAGNOSIS — E7800 Pure hypercholesterolemia, unspecified: Secondary | ICD-10-CM

## 2011-02-04 MED ORDER — ATORVASTATIN CALCIUM 40 MG PO TABS: 40.0000 mg | ORAL_TABLET | Freq: Every day | ORAL | Status: DC

## 2011-02-05 ENCOUNTER — Encounter: Payer: Self-pay | Admitting: *Deleted

## 2011-02-21 ENCOUNTER — Telehealth: Payer: Self-pay | Admitting: Emergency Medicine

## 2011-02-21 NOTE — Telephone Encounter (Signed)
Samples of spiriva and symbicort 160/4.5 left up front for pt to pick up. LM for pt to be made aware.

## 2011-02-23 ENCOUNTER — Encounter: Payer: Self-pay | Admitting: Internal Medicine

## 2011-02-23 DIAGNOSIS — Z0000 Encounter for general adult medical examination without abnormal findings: Secondary | ICD-10-CM | POA: Insufficient documentation

## 2011-02-26 ENCOUNTER — Encounter: Payer: Self-pay | Admitting: Internal Medicine

## 2011-02-26 ENCOUNTER — Ambulatory Visit (INDEPENDENT_AMBULATORY_CARE_PROVIDER_SITE_OTHER): Payer: Medicare Other | Admitting: Internal Medicine

## 2011-02-26 ENCOUNTER — Other Ambulatory Visit (INDEPENDENT_AMBULATORY_CARE_PROVIDER_SITE_OTHER): Payer: Medicare Other

## 2011-02-26 VITALS — BP 142/82 | HR 76 | Temp 98.4°F | Ht 67.0 in | Wt 210.2 lb

## 2011-02-26 DIAGNOSIS — F419 Anxiety disorder, unspecified: Secondary | ICD-10-CM

## 2011-02-26 DIAGNOSIS — Z125 Encounter for screening for malignant neoplasm of prostate: Secondary | ICD-10-CM

## 2011-02-26 DIAGNOSIS — Z0000 Encounter for general adult medical examination without abnormal findings: Secondary | ICD-10-CM

## 2011-02-26 DIAGNOSIS — I10 Essential (primary) hypertension: Secondary | ICD-10-CM

## 2011-02-26 DIAGNOSIS — G8929 Other chronic pain: Secondary | ICD-10-CM | POA: Insufficient documentation

## 2011-02-26 DIAGNOSIS — M109 Gout, unspecified: Secondary | ICD-10-CM | POA: Insufficient documentation

## 2011-02-26 DIAGNOSIS — H3530 Unspecified macular degeneration: Secondary | ICD-10-CM | POA: Insufficient documentation

## 2011-02-26 DIAGNOSIS — F411 Generalized anxiety disorder: Secondary | ICD-10-CM

## 2011-02-26 DIAGNOSIS — G4733 Obstructive sleep apnea (adult) (pediatric): Secondary | ICD-10-CM

## 2011-02-26 DIAGNOSIS — E7800 Pure hypercholesterolemia, unspecified: Secondary | ICD-10-CM

## 2011-02-26 DIAGNOSIS — Z79899 Other long term (current) drug therapy: Secondary | ICD-10-CM

## 2011-02-26 DIAGNOSIS — E119 Type 2 diabetes mellitus without complications: Secondary | ICD-10-CM

## 2011-02-26 DIAGNOSIS — F32A Depression, unspecified: Secondary | ICD-10-CM | POA: Insufficient documentation

## 2011-02-26 DIAGNOSIS — F329 Major depressive disorder, single episode, unspecified: Secondary | ICD-10-CM | POA: Insufficient documentation

## 2011-02-26 DIAGNOSIS — C44311 Basal cell carcinoma of skin of nose: Secondary | ICD-10-CM

## 2011-02-26 DIAGNOSIS — R972 Elevated prostate specific antigen [PSA]: Secondary | ICD-10-CM

## 2011-02-26 DIAGNOSIS — E1165 Type 2 diabetes mellitus with hyperglycemia: Secondary | ICD-10-CM | POA: Insufficient documentation

## 2011-02-26 HISTORY — DX: Type 2 diabetes mellitus without complications: E11.9

## 2011-02-26 HISTORY — DX: Obstructive sleep apnea (adult) (pediatric): G47.33

## 2011-02-26 HISTORY — DX: Elevated prostate specific antigen (PSA): R97.20

## 2011-02-26 HISTORY — DX: Essential (primary) hypertension: I10

## 2011-02-26 HISTORY — DX: Basal cell carcinoma of skin of nose: C44.311

## 2011-02-26 LAB — URINALYSIS, ROUTINE W REFLEX MICROSCOPIC
Bilirubin Urine: NEGATIVE
Hgb urine dipstick: NEGATIVE
Nitrite: NEGATIVE
Total Protein, Urine: 100
Urobilinogen, UA: 0.2 (ref 0.0–1.0)

## 2011-02-26 LAB — CBC WITH DIFFERENTIAL/PLATELET
Basophils Absolute: 0 10*3/uL (ref 0.0–0.1)
Basophils Relative: 0.3 % (ref 0.0–3.0)
Eosinophils Absolute: 0.2 10*3/uL (ref 0.0–0.7)
Lymphocytes Relative: 27.6 % (ref 12.0–46.0)
MCHC: 32.7 g/dL (ref 30.0–36.0)
MCV: 96.6 fl (ref 78.0–100.0)
Monocytes Absolute: 1 10*3/uL (ref 0.1–1.0)
Neutrophils Relative %: 63.1 % (ref 43.0–77.0)
Platelets: 280 10*3/uL (ref 150.0–400.0)
RBC: 4.18 Mil/uL — ABNORMAL LOW (ref 4.22–5.81)
RDW: 15.2 % — ABNORMAL HIGH (ref 11.5–14.6)

## 2011-02-26 LAB — HEMOGLOBIN A1C: Hgb A1c MFr Bld: 6.1 % (ref 4.6–6.5)

## 2011-02-26 MED ORDER — DILTIAZEM HCL ER BEADS 240 MG PO CP24: 240.0000 mg | ORAL_CAPSULE | Freq: Every day | ORAL | Status: DC

## 2011-02-26 MED ORDER — ESOMEPRAZOLE MAGNESIUM 40 MG PO CPDR: 40.0000 mg | DELAYED_RELEASE_CAPSULE | Freq: Every day | ORAL | Status: DC

## 2011-02-26 MED ORDER — HYDROCODONE-ACETAMINOPHEN 7.5-500 MG PO TABS: 1.0000 | ORAL_TABLET | Freq: Four times a day (QID) | ORAL | Status: DC | PRN

## 2011-02-26 MED ORDER — ATORVASTATIN CALCIUM 40 MG PO TABS: 40.0000 mg | ORAL_TABLET | Freq: Every day | ORAL | Status: DC

## 2011-02-26 MED ORDER — CITALOPRAM HYDROBROMIDE 10 MG PO TABS: 10.0000 mg | ORAL_TABLET | Freq: Every day | ORAL | Status: DC

## 2011-02-26 MED ORDER — ALPRAZOLAM 0.5 MG PO TABS: 0.5000 mg | ORAL_TABLET | Freq: Three times a day (TID) | ORAL | Status: DC | PRN

## 2011-02-26 MED ORDER — FUROSEMIDE 40 MG PO TABS: 40.0000 mg | ORAL_TABLET | Freq: Every day | ORAL | Status: DC

## 2011-02-26 NOTE — Patient Instructions (Addendum)
ok to take the hydrocodone as prescribed - Continue all other medications as before Take all new medications as prescribed - the citalopram, and the alprazolam Please go to LAB in the Basement for the blood and/or urine tests to be done today Please call the phone number [phone] (the PhoneTree System) for results of testing in 2-3 days;  When calling, simply dial the number, and when prompted enter the MRN number above (the Medical Record Number) and the # key, then the message should start. Please return in 6 mo with Lab testing done 3-5 days before

## 2011-02-27 LAB — HEPATIC FUNCTION PANEL
ALT: 22 U/L (ref 0–53)
AST: 28 U/L (ref 0–37)
Alkaline Phosphatase: 90 U/L (ref 39–117)
Bilirubin, Direct: 0.1 mg/dL (ref 0.0–0.3)
Total Bilirubin: 1 mg/dL (ref 0.3–1.2)
Total Protein: 6.5 g/dL (ref 6.0–8.3)

## 2011-02-27 LAB — TSH: TSH: 0.93 u[IU]/mL (ref 0.35–5.50)

## 2011-02-27 LAB — LIPID PANEL
Cholesterol: 154 mg/dL (ref 0–200)
LDL Cholesterol: 89 mg/dL (ref 0–99)
Total CHOL/HDL Ratio: 3
Triglycerides: 66 mg/dL (ref 0.0–149.0)

## 2011-02-27 LAB — BASIC METABOLIC PANEL
CO2: 27 mEq/L (ref 19–32)
Chloride: 103 mEq/L (ref 96–112)
Creatinine, Ser: 1.4 mg/dL (ref 0.4–1.5)
Potassium: 4.4 mEq/L (ref 3.5–5.1)

## 2011-02-27 LAB — MICROALBUMIN / CREATININE URINE RATIO: Microalb Creat Ratio: 150.5 mg/g — ABNORMAL HIGH (ref 0.0–30.0)

## 2011-03-03 ENCOUNTER — Encounter: Payer: Self-pay | Admitting: Internal Medicine

## 2011-03-03 DIAGNOSIS — E7800 Pure hypercholesterolemia, unspecified: Secondary | ICD-10-CM | POA: Insufficient documentation

## 2011-03-03 NOTE — Assessment & Plan Note (Signed)
stable overall by hx and exam, most recent data reviewed with pt, and pt to continue medical treatment as before  Lab Results  Component Value Date   HGBA1C 6.1 02/26/2011

## 2011-03-03 NOTE — Assessment & Plan Note (Signed)

## 2011-03-03 NOTE — Progress Notes (Signed)
Subjective:    Patient ID: [name] Stevenson, male    DOB: December 24, 1932, 77 y.o.   MRN: [id number]  HPI  Here for wellness and f/u;  Overall doing ok;  Pt denies CP, worsening SOB, DOE, wheezing, orthopnea, PND, worsening LE edema, palpitations, dizziness or syncope.  Pt denies neurological change such as new Headache, facial or extremity weakness.  Pt denies polydipsia, polyuria, or low sugar symptoms. Pt states overall good compliance with treatment and medications, good tolerability, and trying to follow lower cholesterol diet.  Pt denies worsening depressive symptoms, suicidal ideation or panic. No fever, wt loss, night sweats, loss of appetite, or other constitutional symptoms.  Pt states good ability with ADL's, low fall risk, home safety reviewed and adequate, no significant changes in hearing or vision, and occasionally active with exercise.   Past Medical History  Diagnosis Date  . COPD (chronic obstructive pulmonary disease)   . Cancer     lung  . Stricture and stenosis of esophagus 12/07/2004    EGD done on 12/07/2004  . GERD (gastroesophageal reflux disease)   . Coronary artery disease   . Hypoxemia   . Depression   . Hypertension   . OSA (obstructive sleep apnea) 02/26/2011  . Basal cell carcinoma of nose 02/26/2011  . HTN (hypertension) 02/26/2011  . Increased prostate specific antigen (PSA) velocity 02/26/2011  . DM (diabetes mellitus) 02/26/2011  . DM (diabetes mellitus) 02/26/2011  . Gout 02/26/2011   Past Surgical History  Procedure Date  . Hernia repair     x2  . Lung lobectomy   . Knee arthroscopy   . Visual merchandiser   . Tonsillectomy     reports that he quit smoking about 14 years ago. His smoking use included Cigarettes. He has a 75 pack-year smoking history. His smokeless tobacco use includes Chew. He reports that he does not drink alcohol or use illicit drugs. family history includes Cancer in his other; Heart disease in his father and other; Hypertension in his other;  and Lung cancer in his mother. No Known Allergies Current Outpatient Prescriptions on File Prior to Visit  Medication Sig Dispense Refill  . ALBUTEROL IN Inhale into the lungs as needed.        . ALPRAZolam (XANAX) 0.5 MG tablet Take 1 tablet (0.5 mg total) by mouth 3 (three) times daily as needed.  90 tablet  5  . aspirin 81 MG tablet Take 81 mg by mouth daily.        Marland Kitchen atorvastatin (LIPITOR) 40 MG tablet Take 1 tablet (40 mg total) by mouth daily.  90 tablet  3  . budesonide-formoterol (SYMBICORT) 160-4.5 MCG/ACT inhaler Inhale 2 puffs into the lungs 2 (two) times daily.        . colchicine 0.6 MG tablet Take 1 tablet (0.6 mg total) by mouth daily.  120 tablet  11  . fish oil-omega-3 fatty acids 1000 MG capsule Take 2 g by mouth daily.        . hydrALAZINE (APRESOLINE) 25 MG tablet Take 1 tablet (25 mg total) by mouth 2 (two) times daily.  180 tablet  3  . Mag Aspart-Potassium Aspart 250-250 MG TABS Take 1 tablet by mouth 2 (two) times daily.        . Magnesium Oxide (DIASENSE MAGNESIUM) 400 (241.3 MG) MG TABS Take by mouth.        . metFORMIN (GLUCOPHAGE) 500 MG tablet Take 1 tablet (500 mg total) by mouth daily.  120 tablet  prn  . metoprolol tartrate (LOPRESSOR) 12.5 mg TABS Take 50 mg by mouth 2 (two) times daily.       . Multiple Vitamins-Minerals (EYE VITAMINS) TABS Take 1 tablet by mouth 2 (two) times daily.        . multivitamin (THERAGRAN) per tablet Take 1 tablet by mouth daily.        . Naproxen Sodium (ALEVE PO) Take by mouth 2 (two) times daily.        Marland Kitchen omeprazole (PRILOSEC) 20 MG capsule Take 1 capsule (20 mg total) by mouth daily.  30 capsule  6  . Probiotic Product (RESTORA PO) Take by mouth at bedtime.        Marland Kitchen tiotropium (SPIRIVA HANDIHALER) 18 MCG inhalation capsule Place 1 capsule (18 mcg total) into inhaler and inhale daily.  30 capsule  2  . vitamin B-12 (CYANOCOBALAMIN) 100 MCG tablet Take 50 mcg by mouth daily.        . vitamin E 600 UNIT capsule Take 600 Units by  mouth 2 (two) times daily.         Review of Systems Review of Systems  Constitutional: Negative for diaphoresis, activity change, appetite change and unexpected weight change.  HENT: Negative for hearing loss, ear pain, facial swelling, mouth sores and neck stiffness.   Eyes: Negative for pain, redness and visual disturbance.  Respiratory: Negative for shortness of breath and wheezing.   Cardiovascular: Negative for chest pain and palpitations.  Gastrointestinal: Negative for diarrhea, blood in stool, abdominal distention and rectal pain.  Genitourinary: Negative for hematuria, flank pain and decreased urine volume.  Musculoskeletal: Negative for myalgias and joint swelling.  Skin: Negative for color change and wound.  Neurological: Negative for syncope and numbness.  Hematological: Negative for adenopathy.  Psychiatric/Behavioral: Negative for hallucinations, self-injury, decreased concentration and agitation.      Objective:   Physical Exam BP 142/82  Pulse 76  Temp(Src) 98.4 F (36.9 C) (Oral)  Ht 5\' 7"  (1.702 m)  Wt 210 lb 4 oz (95.369 kg)  BMI 32.93 kg/m2  SpO2 98% Physical Exam  VS noted Constitutional: Pt is oriented to person, place, and time. Appears well-developed and well-nourished.  HENT:  Head: Normocephalic and atraumatic.  Right Ear: External ear normal.  Left Ear: External ear normal.  Nose: Nose normal.  Mouth/Throat: Oropharynx is clear and moist.  Eyes: Conjunctivae and EOM are normal. Pupils are equal, round, and reactive to light.  Neck: Normal range of motion. Neck supple. No JVD present. No tracheal deviation present.  Cardiovascular: Normal rate, regular rhythm, normal heart sounds and intact distal pulses.   Pulmonary/Chest: Effort normal and breath sounds normal.  Abdominal: Soft. Bowel sounds are normal. There is no tenderness.  Musculoskeletal: Normal range of motion. Exhibits no edema.  Lymphadenopathy:  Has no cervical adenopathy.    Neurological: Pt is alert and oriented to person, place, and time. Pt has normal reflexes. No cranial nerve deficit.  Skin: Skin is warm and dry. No rash noted.  Psychiatric:   Behavior is normal. 1-2+ nervous    Assessment & Plan:

## 2011-03-03 NOTE — Assessment & Plan Note (Signed)
Mild to mod, for med tx,  to f/u any worsening symptoms or concerns, declines counseling

## 2011-03-15 ENCOUNTER — Telehealth: Payer: Self-pay | Admitting: Emergency Medicine

## 2011-03-15 ENCOUNTER — Other Ambulatory Visit: Payer: Self-pay | Admitting: *Deleted

## 2011-03-15 DIAGNOSIS — G4700 Insomnia, unspecified: Secondary | ICD-10-CM

## 2011-03-15 MED ORDER — LEVOFLOXACIN 750 MG PO TABS: 750.0000 mg | ORAL_TABLET | Freq: Every day | ORAL | Status: DC

## 2011-03-15 MED ORDER — TEMAZEPAM 30 MG PO CAPS: 30.0000 mg | ORAL_CAPSULE | Freq: Every evening | ORAL | Status: DC | PRN

## 2011-03-15 NOTE — Telephone Encounter (Signed)
I spoke with spouse and she states pt c/o cough w/ yellow phlem, increase SOB, chest congestion, fever of 101.0 last night. Unknown fever this morning. Spouse is wanting to have something called in before it turns into PNA. She states a zpak never helps. Since RB is not in will forward to Dr. Sherene Sires for recs. Please advise thanks  No Known Allergies

## 2011-03-15 NOTE — Telephone Encounter (Signed)
Refilled meds per fax request. Faxed signed rx back Future refills from PCP

## 2011-03-15 NOTE — Telephone Encounter (Signed)
No available appts.  rx for levaquin sent to verified pharmacy.  appt scheduled with TP 1.4.13 - if symptoms worsen over the weekend, pt wife aware to go to ER or UC.  If symptoms do not improve or worsen by next week, wife to call to see if we have a sooner appt.  Pt wife okay with these recs and verbalized her understanding.

## 2011-03-15 NOTE — Telephone Encounter (Signed)
Prefer ov but if declines then levaquin 750 x 5 days

## 2011-03-20 ENCOUNTER — Ambulatory Visit (INDEPENDENT_AMBULATORY_CARE_PROVIDER_SITE_OTHER): Payer: Medicare Other | Admitting: Critical Care Medicine

## 2011-03-20 ENCOUNTER — Ambulatory Visit (INDEPENDENT_AMBULATORY_CARE_PROVIDER_SITE_OTHER)
Admission: RE | Admit: 2011-03-20 | Discharge: 2011-03-20 | Disposition: A | Discharge status: Home / Self Care | Payer: Medicare Other | Source: Ambulatory Visit | Attending: Critical Care Medicine | Admitting: Critical Care Medicine

## 2011-03-20 ENCOUNTER — Telehealth: Payer: Self-pay | Admitting: Emergency Medicine

## 2011-03-20 ENCOUNTER — Encounter: Payer: Self-pay | Admitting: Critical Care Medicine

## 2011-03-20 VITALS — BP 180/100 | HR 72 | Temp 98.2°F | Ht 67.5 in | Wt 211.0 lb

## 2011-03-20 DIAGNOSIS — R05 Cough: Secondary | ICD-10-CM

## 2011-03-20 DIAGNOSIS — R0902 Hypoxemia: Secondary | ICD-10-CM

## 2011-03-20 DIAGNOSIS — J449 Chronic obstructive pulmonary disease, unspecified: Secondary | ICD-10-CM

## 2011-03-20 MED ORDER — PREDNISONE 10 MG PO TABS: ORAL_TABLET | ORAL | Status: DC

## 2011-03-20 MED ORDER — MOXIFLOXACIN HCL 400 MG PO TABS: 400.0000 mg | ORAL_TABLET | Freq: Every day | ORAL | Status: DC

## 2011-03-20 MED ORDER — METHYLPREDNISOLONE ACETATE 80 MG/ML IJ SUSP
120.0000 mg | Freq: Once | INTRAMUSCULAR | Status: AC
  Administered 2011-03-20: 120 mg via INTRAMUSCULAR

## 2011-03-20 NOTE — Patient Instructions (Signed)
A depomedrol 120mg  injection was given Prednisone 10mg  Take 4 for three days 3 for three days 2 for three days 1 for three days and stop Avelox 400mg  daily x 5 days No other medication changes Return 1 week with tammy parrett for recheck

## 2011-03-20 NOTE — Telephone Encounter (Signed)
C/o increased SOB, cough, "feels like pneumonia", was scheduled to see TP on the 4th and can not wait that long. To be seen by PW this AM at 10:30am and informed to go to xray first.

## 2011-03-20 NOTE — Progress Notes (Signed)
Subjective:    Patient ID: [name] Stevenson, male    DOB: 1932-07-10, 78 y.o.   MRN: [id number]  HPI 78 yo man, with severe COPD and chronic hypoxemia. He also has a history of non-small cell lung cancer, status post right upper lobectomy and adjuvant chemotherapy in 2006.   Following with Dr Welton Flakes and Edwyna Shell in Sept 10. Wears CPAP every night. Provided by [employer], suposedly entitled to a new one.  March 24, 2009--Presents for an acute office visit. Complains of prod cough with white mucus, increased SOB, wheezing, low grade temp x1 week. finished 5 day course amoxicilin 500mg  bid yesterday. This did not work. Now has lots of coughing with thick mucus. Finish Amox. last night.  Denies chest pain, orthopnea, hemoptysis, fever, n/v/d, edema, headache.   ROV 04/05/09 -- Pt returns for f/u. He was seen by TP 1/7 for an exacerbation in setting URI. He has completed avelox and prednisone. He is doing better, but still with clear nasal drainage and dry cough. Has not required frequent ProAir. Getting back to his usual activity.   ROV 07/25/09 -- f/u for COPD, OSA. Last time we tried stopping Advair to see if it helped his throat irritation. he has not restarted it, throat feels better. He may be having more exertional SOB since stopping the Advair. Tolerates Spiriva.   ROV 10/25/09 -- COPD and OSA. Tells me has been feeling well. Last Ct scan showed no evidence recurrance of his lung CA. Breathing has been OK, the heat has limited his activity. Last time we changed Advair to Symbicort - has helped his irritated throat, has also made his breathing a bit better. Never had to stop his Lisinopril. No new problems, no exacerbations.   ROV 05/04/10 -- Hx COPD and OSA, also hx NSCLCA and RUL lobectomy. Reports that he was treated for AE-COPD with abx and pred. Has been on Spiriva and Symbicort, cut back on the symbicort due to cost. Has been using once daily instead of two times a day. Had the pneumovax 7 yrs  ago, shouldn't tneed it again. Having CT scans of the chest followed by Dr Welton Flakes, annually. Wears CPAP every night.   ROV 10/19/10 -- COPD, OSA, NSCLCA s/p RUL lobectomy. He had been getting CT scans annually, planning to see Dr Welton Flakes. May be having some decreased energy since February. He has been having difficulty with his swallowing - some dysphagia, food getting stuck. Sometimes causes him to get choked. Wears CPAP mask reliably.  Only using Symbicort daily, Spiriva.   03/20/2011 Pt ill for one week.  Rx levaquin x 5days 750/d. Not improved.  Severe dyspnea at rest. Not able to walk. No real chest pain.  Notes more wheezing.  Severe dyspnea even at rest.  No real fever T99.7  At home.  Did get flu vaccine in November.  Review of Systems Constitutional:   No  weight loss, night sweats,  Fevers, chills, fatigue, lassitude. HEENT:   No headaches,  Difficulty swallowing,  Tooth/dental problems,  Sore throat,                No sneezing, itching, ear ache, nasal congestion, post nasal drip,   CV:  No chest pain,  Orthopnea, PND, swelling in lower extremities, anasarca, dizziness, palpitations  GI  No heartburn, indigestion, abdominal pain, nausea, vomiting, diarrhea, change in bowel habits, loss of appetite  Resp: Notes  shortness of breath with exertion and  at rest.  Notes  excess mucus, notes  productive cough,  No non-productive cough,  No coughing up of blood.  No change in color of mucus.  No wheezing.  No chest wall deformity  Skin: no rash or lesions.  GU: no dysuria, change in color of urine, no urgency or frequency.  No flank pain.  MS:  No joint pain or swelling.  No decreased range of motion.  No back pain.  Psych:  No change in mood or affect. No depression or anxiety.  No memory loss.     Objective:   Physical Exam  Filed Vitals:   03/20/11 1041  BP: 180/100  Pulse: 72  Temp: 98.2 F (36.8 C)  TempSrc: Oral  Height: 5' 7.5" (1.715 m)  Weight: 211 lb (95.709 kg)  SpO2:  95%    Gen: Pleasant, well-nourished, in mild resp  distress,  normal affect, on nasal oxygen  ENT: No lesions,  mouth clear,  oropharynx clear, no postnasal drip  Neck: No JVD, no TMG, no carotid bruits  Lungs: No use of accessory muscles, no dullness to percussion, exp wheeze, poor airflow, scattered rhonchi  Cardiovascular: RRR, heart sounds normal, no murmur or gallops, no peripheral edema  Abdomen: soft and NT, no HSM,  BS normal  Musculoskeletal: No deformities, no cyanosis or clubbing  Neuro: alert, non focal  Skin: Warm, no lesions or rashes  Dg Chest 2 View  03/20/2011  *RADIOLOGY REPORT*  Clinical Data: Shortness of breath.  Cough.  Chest pain.  Lung cancer.  CHEST - 2 VIEW  Comparison: 02/08/2009  Findings: Bibasilar pleural thickening remains stable. Cardiomegaly is also unchanged.  No evidence of acute infiltrate or edema.  No mass or lymphadenopathy identified.  Dual lead transvenous pacemaker and left-sided Port-A-Cath remains in appropriate position.  IMPRESSION: Stable cardiomegaly and bibasilar pleural thickening.  No active disease.  Original Report Authenticated By: Danae Orleans, [name]         Assessment & Plan:   CHRONIC OBSTRUCTIVE PULMONARY DISEASE, SEVERE Primary Copd pt of dr [name], not seen here since 8/12. Pt with Copd exacerbation but neg CXR for PNA. Pt with acute on chronic resp failure. Plan A depomedrol 120mg  injection was given Prednisone 10mg  Take 4 for three days 3 for three days 2 for three days 1 for three days and stop Avelox 400mg  daily x 5 days No other medication changes Return 1 week with tammy parrett for recheck     Updated Medication List Outpatient Encounter Prescriptions as of 03/20/2011  Medication Sig Dispense Refill  . ALBUTEROL IN Inhale into the lungs as needed.        . ALPRAZolam (XANAX) 0.5 MG tablet Take 1 tablet (0.5 mg total) by mouth 3 (three) times daily as needed.  90 tablet  5  . aspirin 81 MG tablet Take 81 mg  by mouth daily.        Marland Kitchen atorvastatin (LIPITOR) 40 MG tablet Take 1 tablet (40 mg total) by mouth daily.  90 tablet  3  . budesonide-formoterol (SYMBICORT) 160-4.5 MCG/ACT inhaler Inhale 2 puffs into the lungs 2 (two) times daily.        . citalopram (CELEXA) 10 MG tablet Take 1 tablet (10 mg total) by mouth daily.  90 tablet  3  . colchicine 0.6 MG tablet Take 1 tablet (0.6 mg total) by mouth daily.  120 tablet  11  . diltiazem (TIAZAC) 240 MG 24 hr capsule Take 1 capsule (240 mg total) by mouth daily.  90 capsule  3  .  esomeprazole (NEXIUM) 40 MG capsule Take 1 capsule (40 mg total) by mouth daily before breakfast.  90 capsule  3  . fish oil-omega-3 fatty acids 1000 MG capsule Take 2 g by mouth daily.        . furosemide (LASIX) 40 MG tablet Take 1 tablet (40 mg total) by mouth daily.  90 tablet  3  . hydrALAZINE (APRESOLINE) 25 MG tablet Take 1 tablet (25 mg total) by mouth 2 (two) times daily.  180 tablet  3  . Mag Aspart-Potassium Aspart 250-250 MG TABS Take 1 tablet by mouth 2 (two) times daily.        . Magnesium Oxide (DIASENSE MAGNESIUM) 400 (241.3 MG) MG TABS Take 1 tablet by mouth daily.       . metFORMIN (GLUCOPHAGE) 500 MG tablet Take 1 tablet (500 mg total) by mouth daily.  120 tablet  prn  . metoprolol tartrate (LOPRESSOR) 12.5 mg TABS Take 50 mg by mouth 2 (two) times daily.       . Multiple Vitamins-Minerals (EYE VITAMINS) TABS Take 1 tablet by mouth 2 (two) times daily.        . multivitamin (THERAGRAN) per tablet Take 1 tablet by mouth daily.        . Naproxen Sodium (ALEVE PO) Take by mouth 2 (two) times daily.        Marland Kitchen omeprazole (PRILOSEC) 20 MG capsule Take 1 capsule (20 mg total) by mouth daily.  30 capsule  6  . Probiotic Product (RESTORA PO) Take by mouth at bedtime.        . temazepam (RESTORIL) 30 MG capsule Take 1 capsule (30 mg total) by mouth at bedtime as needed for sleep.  30 capsule  0  . tiotropium (SPIRIVA HANDIHALER) 18 MCG inhalation capsule Place 1 capsule  (18 mcg total) into inhaler and inhale daily.  30 capsule  2  . vitamin B-12 (CYANOCOBALAMIN) 100 MCG tablet Take 50 mcg by mouth daily.        . vitamin E 600 UNIT capsule Take 600 Units by mouth 2 (two) times daily.        Marland Kitchen DISCONTD: levofloxacin (LEVAQUIN) 750 MG tablet Take 1 tablet (750 mg total) by mouth daily.  5 tablet  0  . moxifloxacin (AVELOX) 400 MG tablet Take 1 tablet (400 mg total) by mouth daily.  5 tablet  0  . predniSONE (DELTASONE) 10 MG tablet Take 4 for three days 3 for three days 2 for three days 1 for three days and stop  30 tablet  0   Facility-Administered Encounter Medications as of 03/20/2011  Medication Dose Route Frequency Provider Last Rate Last Dose  . methylPREDNISolone acetate (DEPO-MEDROL) injection 120 mg  120 mg Intramuscular Once Shan Levans, MD   120 mg at 03/20/11 1207

## 2011-03-20 NOTE — Progress Notes (Signed)
Quick Note:  Notify the patient that the Xray is stable and no pneumonia No change in medications are recommended. Continue current meds as prescribed at last office visit ______ 

## 2011-03-21 NOTE — Assessment & Plan Note (Signed)
Primary Copd pt of dr [name], not seen here since 8/12. Pt with Copd exacerbation but neg CXR for PNA. Pt with acute on chronic resp failure. Plan A depomedrol 120mg  injection was given Prednisone 10mg  Take 4 for three days 3 for three days 2 for three days 1 for three days and stop Avelox 400mg  daily x 5 days No other medication changes Return 1 week with [name] Stevenson for recheck

## 2011-03-22 ENCOUNTER — Ambulatory Visit: Payer: Medicare Other | Admitting: Adult Health

## 2011-03-28 ENCOUNTER — Encounter: Payer: Self-pay | Admitting: Adult Health

## 2011-03-28 ENCOUNTER — Ambulatory Visit (INDEPENDENT_AMBULATORY_CARE_PROVIDER_SITE_OTHER): Payer: Medicare Other | Admitting: Adult Health

## 2011-03-28 VITALS — BP 152/90 | HR 87 | Temp 97.4°F | Ht 67.0 in | Wt 213.4 lb

## 2011-03-28 DIAGNOSIS — J449 Chronic obstructive pulmonary disease, unspecified: Secondary | ICD-10-CM

## 2011-03-28 MED ORDER — LEVALBUTEROL HCL 0.63 MG/3ML IN NEBU
0.6300 mg | INHALATION_SOLUTION | Freq: Once | RESPIRATORY_TRACT | Status: AC
  Administered 2011-03-28: 0.63 mg via RESPIRATORY_TRACT

## 2011-03-28 NOTE — Progress Notes (Signed)
Subjective:    Patient ID: [name] Stevenson, male    DOB: May 10, 1932, 78 y.o.   MRN: [id number]  HPI  78 yo man, with severe COPD and chronic hypoxemia. He also has a history of non-small cell lung cancer, status post right upper lobectomy and adjuvant chemotherapy in 2006.   Following with Dr Welton Flakes and Edwyna Shell in Sept 10. Wears CPAP every night. Provided by [employer], suposedly entitled to a new one.  March 24, 2009--Presents for an acute office visit. Complains of prod cough with white mucus, increased SOB, wheezing, low grade temp x1 week. finished 5 day course amoxicilin 500mg  bid yesterday. This did not work. Now has lots of coughing with thick mucus. Finish Amox. last night.  Denies chest pain, orthopnea, hemoptysis, fever, n/v/d, edema, headache.   ROV 04/05/09 -- Pt returns for f/u. He was seen by TP 1/7 for an exacerbation in setting URI. He has completed avelox and prednisone. He is doing better, but still with clear nasal drainage and dry cough. Has not required frequent ProAir. Getting back to his usual activity.   ROV 07/25/09 -- f/u for COPD, OSA. Last time we tried stopping Advair to see if it helped his throat irritation. he has not restarted it, throat feels better. He may be having more exertional SOB since stopping the Advair. Tolerates Spiriva.   ROV 10/25/09 -- COPD and OSA. Tells me has been feeling well. Last Ct scan showed no evidence recurrance of his lung CA. Breathing has been OK, the heat has limited his activity. Last time we changed Advair to Symbicort - has helped his irritated throat, has also made his breathing a bit better. Never had to stop his Lisinopril. No new problems, no exacerbations.   ROV 05/04/10 -- Hx COPD and OSA, also hx NSCLCA and RUL lobectomy. Reports that he was treated for AE-COPD with abx and pred. Has been on Spiriva and Symbicort, cut back on the symbicort due to cost. Has been using once daily instead of two times a day. Had the pneumovax 7 yrs  ago, shouldn't tneed it again. Having CT scans of the chest followed by Dr Welton Flakes, annually. Wears CPAP every night.   ROV 10/19/10 -- COPD, OSA, NSCLCA s/p RUL lobectomy. He had been getting CT scans annually, planning to see Dr Welton Flakes. May be having some decreased energy since February. He has been having difficulty with his swallowing - some dysphagia, food getting stuck. Sometimes causes him to get choked. Wears CPAP mask reliably.  Only using Symbicort daily, Spiriva.   03/20/2011 Pt ill for one week.  Rx levaquin x 5days 750/d. Not improved.  Severe dyspnea at rest. Not able to walk. No real chest pain.  Notes more wheezing.  Severe dyspnea even at rest.  No real fever T99.7  At home.  Did get flu vaccine in November. >>RX Avelox x 7 days , steroid taper.XR w/ no acute changes   03/28/2011 Follow up  Pt returns for a 1 week follow up for COPD flare. Seen in office last week, tx with Avelox and steroid taper. CXR with no acute process last ov.  Feeling some better but still has a lot of coughing and congestion  Finished Avelox yesterday. Has few days left on prednisone.  No hemotpysis or chest pain .   Review of Systems  Constitutional:   No  weight loss, night sweats,  Fevers, chills,  +fatigue, lassitude. HEENT:   No headaches,  Difficulty swallowing,  Tooth/dental problems,  Sore throat,                No sneezing, itching, ear ache, nasal congestion, post nasal drip,   CV:  No chest pain,  Orthopnea, PND, swelling in lower extremities, anasarca, dizziness, palpitations  GI  No heartburn, indigestion, abdominal pain, nausea, vomiting, diarrhea, change in bowel habits, loss of appetite  Resp:   No coughing up of blood.    No chest wall deformity  Skin: no rash or lesions.  GU: no dysuria, change in color of urine, no urgency or frequency.  No flank pain.  MS:  No joint pain or swelling.  No decreased range of motion.     Psych:  No change in mood or affect. No depression or anxiety.   No memory loss.     Objective:   Physical Exam   Filed Vitals:   03/28/11 1152 03/28/11 1153  BP: 152/90   Pulse: 87   Temp: 97.4 F (36.3 C)   TempSrc: Oral   Height: 5\' 7"  (1.702 m)   Weight: 213 lb 6.4 oz (96.798 kg)   SpO2: 95% 95%    Gen: Pleasant, elderly   normal affect, on nasal oxygen  ENT: No lesions,  mouth clear,  oropharynx clear, no postnasal drip  Neck: No JVD, no TMG, no carotid bruits  Lungs: No use of accessory muscles, no dullness to percussion,  Coarse BS w/ few rhonchi   Cardiovascular: RRR, heart sounds normal, no murmur or gallops, no peripheral edema  Abdomen: soft and NT, no HSM,  BS normal  Musculoskeletal: No deformities, no cyanosis or clubbing  Neuro: alert, non focal  Skin: Warm, no lesions or rashes  No results found.       Assessment & Plan:   No problem-specific assessment & plan notes found for this encounter.   Updated Medication List Outpatient Encounter Prescriptions as of 03/28/2011  Medication Sig Dispense Refill  . ALBUTEROL IN Inhale into the lungs as needed.        . ALPRAZolam (XANAX) 0.5 MG tablet Take 1 tablet (0.5 mg total) by mouth 3 (three) times daily as needed.  90 tablet  5  . aspirin 81 MG tablet Take 81 mg by mouth daily.        Marland Kitchen atorvastatin (LIPITOR) 40 MG tablet Take 1 tablet (40 mg total) by mouth daily.  90 tablet  3  . budesonide-formoterol (SYMBICORT) 160-4.5 MCG/ACT inhaler Inhale 2 puffs into the lungs 2 (two) times daily.        . citalopram (CELEXA) 10 MG tablet Take 1 tablet (10 mg total) by mouth daily.  90 tablet  3  . colchicine 0.6 MG tablet Take 1 tablet (0.6 mg total) by mouth daily.  120 tablet  11  . diltiazem (TIAZAC) 240 MG 24 hr capsule Take 1 capsule (240 mg total) by mouth daily.  90 capsule  3  . esomeprazole (NEXIUM) 40 MG capsule Take 1 capsule (40 mg total) by mouth daily before breakfast.  90 capsule  3  . fish oil-omega-3 fatty acids 1000 MG capsule Take 2 g by mouth daily.         . furosemide (LASIX) 40 MG tablet Take 1 tablet (40 mg total) by mouth daily.  90 tablet  3  . hydrALAZINE (APRESOLINE) 25 MG tablet Take 1 tablet (25 mg total) by mouth 2 (two) times daily.  180 tablet  3  . Mag Aspart-Potassium Aspart 250-250 MG TABS Take 1 tablet  by mouth 2 (two) times daily.        . Magnesium Oxide (DIASENSE MAGNESIUM) 400 (241.3 MG) MG TABS Take 1 tablet by mouth daily.       . metFORMIN (GLUCOPHAGE) 500 MG tablet Take 1 tablet (500 mg total) by mouth daily.  120 tablet  prn  . metoprolol tartrate (LOPRESSOR) 12.5 mg TABS Take 50 mg by mouth 2 (two) times daily.       . Multiple Vitamins-Minerals (EYE VITAMINS) TABS Take 1 tablet by mouth 2 (two) times daily.        . multivitamin (THERAGRAN) per tablet Take 1 tablet by mouth daily.        . Naproxen Sodium (ALEVE PO) Take by mouth 2 (two) times daily.        Marland Kitchen omeprazole (PRILOSEC) 20 MG capsule Take 1 capsule (20 mg total) by mouth daily.  30 capsule  6  . predniSONE (DELTASONE) 10 MG tablet Take 4 for three days 3 for three days 2 for three days 1 for three days and stop  30 tablet  0  . Probiotic Product (RESTORA PO) Take by mouth at bedtime.        . temazepam (RESTORIL) 30 MG capsule Take 1 capsule (30 mg total) by mouth at bedtime as needed for sleep.  30 capsule  0  . tiotropium (SPIRIVA HANDIHALER) 18 MCG inhalation capsule Place 1 capsule (18 mcg total) into inhaler and inhale daily.  30 capsule  2  . vitamin B-12 (CYANOCOBALAMIN) 100 MCG tablet Take 50 mcg by mouth daily.        . vitamin E 600 UNIT capsule Take 600 Units by mouth 2 (two) times daily.        Marland Kitchen DISCONTD: moxifloxacin (AVELOX) 400 MG tablet Take 1 tablet (400 mg total) by mouth daily.  5 tablet  0

## 2011-03-28 NOTE — Assessment & Plan Note (Signed)
Slow to resolve flare- xopenex neb in office  Plan: Extend Avelox daily for 3 days - samples given.  Mucinex DM Twice daily  As needed  Cough/congestion  Taper prednisone taper as directed.  follow up Dr. [name]  As planned in 2 weeks and As needed

## 2011-03-28 NOTE — Patient Instructions (Signed)
Extend Avelox daily for 3 days - samples given.  Mucinex DM Twice daily  As needed  Cough/congestion  Taper prednisone taper as directed.  follow up Dr. [name]  As planned in 2 weeks and As needed

## 2011-04-08 ENCOUNTER — Other Ambulatory Visit: Payer: Self-pay

## 2011-04-08 DIAGNOSIS — G4700 Insomnia, unspecified: Secondary | ICD-10-CM

## 2011-04-08 MED ORDER — TEMAZEPAM 30 MG PO CAPS: 30.0000 mg | ORAL_CAPSULE | Freq: Every evening | ORAL | Status: DC | PRN

## 2011-04-08 NOTE — Telephone Encounter (Signed)
Hard copy faxed to pharmacy

## 2011-04-18 ENCOUNTER — Ambulatory Visit (INDEPENDENT_AMBULATORY_CARE_PROVIDER_SITE_OTHER): Payer: Medicare Other | Admitting: Emergency Medicine

## 2011-04-18 ENCOUNTER — Encounter: Payer: Self-pay | Admitting: Emergency Medicine

## 2011-04-18 ENCOUNTER — Ambulatory Visit (INDEPENDENT_AMBULATORY_CARE_PROVIDER_SITE_OTHER): Payer: Medicare Other | Admitting: *Deleted

## 2011-04-18 ENCOUNTER — Encounter: Payer: Self-pay | Admitting: Internal Medicine

## 2011-04-18 DIAGNOSIS — J449 Chronic obstructive pulmonary disease, unspecified: Secondary | ICD-10-CM

## 2011-04-18 DIAGNOSIS — I498 Other specified cardiac arrhythmias: Secondary | ICD-10-CM

## 2011-04-18 DIAGNOSIS — Z950 Presence of cardiac pacemaker: Secondary | ICD-10-CM

## 2011-04-18 DIAGNOSIS — G4710 Hypersomnia, unspecified: Secondary | ICD-10-CM

## 2011-04-18 NOTE — Assessment & Plan Note (Signed)
--  continue CPAP

## 2011-04-18 NOTE — Progress Notes (Signed)
Subjective:    Patient ID: [name] Stevenson, male    DOB: November 02, 1932, 78 y.o.   MRN: [id number]  HPI  78 yo man, with severe COPD and chronic hypoxemia. He also has a history of non-small cell lung cancer, status post right upper lobectomy and adjuvant chemotherapy in 2006.   ROV 07/25/09 -- f/u for COPD, OSA. Last time we tried stopping Advair to see if it helped his throat irritation. he has not restarted it, throat feels better. He may be having more exertional SOB since stopping the Advair. Tolerates Spiriva.   ROV 10/25/09 -- COPD and OSA. Tells me has been feeling well. Last Ct scan showed no evidence recurrance of his lung CA. Breathing has been OK, the heat has limited his activity. Last time we changed Advair to Symbicort - has helped his irritated throat, has also made his breathing a bit better. Never had to stop his Lisinopril. No new problems, no exacerbations.   ROV 05/04/10 -- Hx COPD and OSA, also hx NSCLCA and RUL lobectomy. Reports that he was treated for AE-COPD with abx and pred. Has been on Spiriva and Symbicort, cut back on the symbicort due to cost. Has been using once daily instead of two times a day. Had the pneumovax 7 yrs ago, shouldn't tneed it again. Having CT scans of the chest followed by Dr Welton Flakes, annually. Wears CPAP every night.   ROV 10/19/10 -- COPD, OSA, NSCLCA s/p RUL lobectomy. He had been getting CT scans annually, planning to see Dr Welton Flakes. May be having some decreased energy since February. He has been having difficulty with his swallowing - some dysphagia, food getting stuck. Sometimes causes him to get choked. Wears CPAP mask reliably.  Only using Symbicort daily, Spiriva.   03/20/2011 Pt ill for one week.  Rx levaquin x 5days 750/d. Not improved.  Severe dyspnea at rest. Not able to walk. No real chest pain.  Notes more wheezing.  Severe dyspnea even at rest.  No real fever T99.7  At home.  Did get flu vaccine in November. >>RX Avelox x 7 days , steroid taper.XR w/  no acute changes   Follow up  Pt returns for a 1 week follow up for COPD flare. Seen in office last week, tx with Avelox and steroid taper. CXR with no acute process last ov.  Feeling some better but still has a lot of coughing and congestion  Finished Avelox yesterday. Has few days left on prednisone.  No hemotpysis or chest pain .   ROV 04/18/11 -- Severe COPD, OSA, NSCLCA s/p RUL lobectomy. He was seen beginning of the month for an exacerbation following probable URI, CXR showed no infiltrates. He was treated w steroids and abx. He is improved, close to baseline. Using Spiriva and Symbicort.       Objective:   Physical Exam   Filed Vitals:   04/18/11 1216  BP: 140/80  Pulse: 7  Temp: 98.5 F (36.9 C)  TempSrc: Oral  Height: 5\' 7"  (1.702 m)  Weight: 213 lb 9.6 oz (96.888 kg)  SpO2: 96%    Gen: Pleasant, elderly   normal affect, on nasal oxygen  ENT: No lesions,  mouth clear,  oropharynx clear, no postnasal drip  Neck: No JVD, no TMG, no carotid bruits  Lungs: No use of accessory muscles, no dullness to percussion,  No wheezing  Cardiovascular: RRR, heart sounds normal, no murmur or gallops, no peripheral edema  Abdomen: soft and NT, no HSM,  BS  normal  Musculoskeletal: No deformities, no cyanosis or clubbing  Neuro: alert, non focal  Skin: Warm, no lesions or rashes      Assessment & Plan:   CHRONIC OBSTRUCTIVE PULMONARY DISEASE, SEVERE Severe COPD with recent exacerbation. He was slow to resolve and required antibiotics and prednisone twice. He now appears to be approaching baseline - Continue Spiriva and Symbicort - Continue oxygen - follow up in 2 months  HYPERSOMNIA WITH SLEEP APNEA UNSPECIFIED - continue CPAP

## 2011-04-18 NOTE — Assessment & Plan Note (Signed)
Severe COPD with recent exacerbation. He was slow to resolve and required antibiotics and prednisone twice. He now appears to be approaching baseline - Continue Spiriva and Symbicort - Continue oxygen - follow up in 2 months

## 2011-04-18 NOTE — Patient Instructions (Signed)
Please continue your Spiriva and Symbicort Use your albuterol as needed Wear your oxygen  Use your CPAP every night Follow with Dr [name] in 2 months or sooner if you have any problems.

## 2011-04-20 LAB — REMOTE PACEMAKER DEVICE
AL AMPLITUDE: 3.0239 mv
AL IMPEDENCE PM: 560 Ohm
BAMS-0001: 170 {beats}/min
RV LEAD AMPLITUDE: 14.8 mv

## 2011-04-24 NOTE — Progress Notes (Signed)
Remote pacer check  

## 2011-05-01 ENCOUNTER — Telehealth: Payer: Self-pay | Admitting: *Deleted

## 2011-05-01 NOTE — Telephone Encounter (Signed)
Per spouse, pt needs PA on Temazepam. Number to call is [phone]. Pt spouse is requesting a callback regarding PA.

## 2011-05-02 ENCOUNTER — Telehealth: Payer: Self-pay

## 2011-05-02 ENCOUNTER — Other Ambulatory Visit: Payer: Self-pay

## 2011-05-02 MED ORDER — METOPROLOL TARTRATE 12.5 MG HALF TABLET: 50.0000 mg | ORAL_TABLET | Freq: Two times a day (BID) | ORAL | Status: DC

## 2011-05-02 MED ORDER — METOPROLOL TARTRATE 50 MG PO TABS: 50.0000 mg | ORAL_TABLET | Freq: Two times a day (BID) | ORAL | Status: DC

## 2011-05-02 NOTE — Telephone Encounter (Signed)
Rx called for clarification on Lopressor.

## 2011-05-07 ENCOUNTER — Encounter: Payer: Self-pay | Admitting: *Deleted

## 2011-05-09 ENCOUNTER — Other Ambulatory Visit: Payer: Self-pay

## 2011-05-09 DIAGNOSIS — G4700 Insomnia, unspecified: Secondary | ICD-10-CM

## 2011-05-09 MED ORDER — TEMAZEPAM 30 MG PO CAPS: 30.0000 mg | ORAL_CAPSULE | Freq: Every evening | ORAL | Status: DC | PRN

## 2011-05-09 NOTE — Telephone Encounter (Signed)
Faxed hardcopy to pharmacy and faxed copy from [employer] rx letter of approval for this medicaion.

## 2011-05-09 NOTE — Telephone Encounter (Signed)
Done hardcopy to robin  

## 2011-05-09 NOTE — Telephone Encounter (Signed)
Called the for PA and medication was approved from 05/09/2011 through 05/08/2012. Called and informed the patient as well. ID # [id number].

## 2011-06-03 ENCOUNTER — Other Ambulatory Visit: Payer: Self-pay | Admitting: *Deleted

## 2011-06-03 MED ORDER — OMEPRAZOLE 20 MG PO CPDR: 20.0000 mg | DELAYED_RELEASE_CAPSULE | Freq: Every day | ORAL | Status: DC

## 2011-06-05 ENCOUNTER — Encounter: Payer: Self-pay | Admitting: Emergency Medicine

## 2011-06-05 ENCOUNTER — Ambulatory Visit (INDEPENDENT_AMBULATORY_CARE_PROVIDER_SITE_OTHER): Payer: Medicare Other | Admitting: Emergency Medicine

## 2011-06-05 VITALS — BP 136/88 | HR 71 | Temp 98.2°F | Ht 67.0 in | Wt 215.2 lb

## 2011-06-05 DIAGNOSIS — J449 Chronic obstructive pulmonary disease, unspecified: Secondary | ICD-10-CM

## 2011-06-05 NOTE — Patient Instructions (Signed)
Continue your current inhaled medications Wear your oxygen at 3L/min Continue to do your exercise 3x a week. We could consider Pulmonary Rehab at [facility] at some point in the future.  Follow with Dr [name] in 3 months or sooner if you have any problems

## 2011-06-05 NOTE — Assessment & Plan Note (Signed)
Continue your current inhaled medications Wear your oxygen at 3L/min Continue to do your exercise 3x a week. We could consider Pulmonary Rehab at [facility] at some point in the future.  Follow with Dr Verneice Caspers in 3 months or sooner if you have any problems 

## 2011-06-05 NOTE — Progress Notes (Signed)
Subjective:    Patient ID: [name] Stevenson, male    DOB: [birth year], 78 y.o.   MRN: [id number]  HPI 78 yo man, with severe COPD and chronic hypoxemia. He also has a history of non-small cell lung cancer, status post right upper lobectomy and adjuvant chemotherapy in 2006.   ROV 07/25/09 -- f/u for COPD, OSA. Last time we tried stopping Advair to see if it helped his throat irritation. he has not restarted it, throat feels better. He may be having more exertional SOB since stopping the Advair. Tolerates Spiriva.   ROV 10/25/09 -- COPD and OSA. Tells me has been feeling well. Last Ct scan showed no evidence recurrance of his lung CA. Breathing has been OK, the heat has limited his activity. Last time we changed Advair to Symbicort - has helped his irritated throat, has also made his breathing a bit better. Never had to stop his Lisinopril. No new problems, no exacerbations.   ROV 05/04/10 -- Hx COPD and OSA, also hx NSCLCA and RUL lobectomy. Reports that he was treated for AE-COPD with abx and pred. Has been on Spiriva and Symbicort, cut back on the symbicort due to cost. Has been using once daily instead of two times a day. Had the pneumovax 7 yrs ago, shouldn't tneed it again. Having CT scans of the chest followed by Dr Welton Flakes, annually. Wears CPAP every night.   ROV 10/19/10 -- COPD, OSA, NSCLCA s/p RUL lobectomy. He had been getting CT scans annually, planning to see Dr Welton Flakes. May be having some decreased energy since February. He has been having difficulty with his swallowing - some dysphagia, food getting stuck. Sometimes causes him to get choked. Wears CPAP mask reliably.  Only using Symbicort daily, Spiriva.   03/20/2011 Pt ill for one week.  Rx levaquin x 5days 750/d. Not improved.  Severe dyspnea at rest. Not able to walk. No real chest pain.  Notes more wheezing.  Severe dyspnea even at rest.  No real fever T99.7  At home.  Did get flu vaccine in November. >>RX Avelox x 7 days , steroid taper.XR w/  no acute changes   Follow up  Pt returns for a 1 week follow up for COPD flare. Seen in office last week, tx with Avelox and steroid taper. CXR with no acute process last ov.  Feeling some better but still has a lot of coughing and congestion  Finished Avelox yesterday. Has few days left on prednisone.  No hemotpysis or chest pain .   ROV 04/18/11 -- Severe COPD, OSA, NSCLCA s/p RUL lobectomy. He was seen beginning of the month for an exacerbation following probable URI, CXR showed no infiltrates. He was treated w steroids and abx. He is improved, close to baseline. Using Spiriva and Symbicort.   ROV 06/05/11 -- Severe COPD, OSA, NSCLCA s/p RUL lobectomy. He still hasn't gotten back to prior baseline since URI and AE. He is on 3L/min pulsed. He does exert but w difficulty. He is on Spiriva + Symbicort.       Objective:   Physical Exam  Filed Vitals:   06/05/11 1533  BP: 136/88  Pulse: 71  Temp: 98.2 F (36.8 C)  TempSrc: Oral  Height: 5\' 7"  (1.702 m)  Weight: 215 lb 3.2 oz (97.614 kg)  SpO2: 95%   Gen: Pleasant, elderly   normal affect, on nasal oxygen  ENT: No lesions,  mouth clear,  oropharynx clear, no postnasal drip  Neck: No JVD, no TMG, no  carotid bruits  Lungs: No use of accessory muscles, no dullness to percussion,  No wheezing  Cardiovascular: RRR, heart sounds normal, no murmur or gallops, no peripheral edema  Abdomen: soft and NT, no HSM,  BS normal  Musculoskeletal: No deformities, no cyanosis or clubbing  Neuro: alert, non focal  Skin: Warm, no lesions or rashes      Assessment & Plan:   CHRONIC OBSTRUCTIVE PULMONARY DISEASE, SEVERE Continue your current inhaled medications Wear your oxygen at 3L/min Continue to do your exercise 3x a week. We could consider Pulmonary Rehab at [facility] at some point in the future.  Follow with Dr [name] in 3 months or sooner if you have any problems

## 2011-06-06 ENCOUNTER — Ambulatory Visit (INDEPENDENT_AMBULATORY_CARE_PROVIDER_SITE_OTHER): Payer: Medicare Other | Admitting: Cardiology

## 2011-06-06 ENCOUNTER — Encounter: Payer: Self-pay | Admitting: Cardiology

## 2011-06-06 VITALS — BP 128/88 | HR 78 | Ht 67.0 in | Wt 214.0 lb

## 2011-06-06 DIAGNOSIS — J449 Chronic obstructive pulmonary disease, unspecified: Secondary | ICD-10-CM

## 2011-06-06 DIAGNOSIS — I495 Sick sinus syndrome: Secondary | ICD-10-CM

## 2011-06-06 DIAGNOSIS — R0600 Dyspnea, unspecified: Secondary | ICD-10-CM

## 2011-06-06 DIAGNOSIS — I10 Essential (primary) hypertension: Secondary | ICD-10-CM

## 2011-06-06 DIAGNOSIS — E7800 Pure hypercholesterolemia, unspecified: Secondary | ICD-10-CM

## 2011-06-06 DIAGNOSIS — Z950 Presence of cardiac pacemaker: Secondary | ICD-10-CM

## 2011-06-06 DIAGNOSIS — I350 Nonrheumatic aortic (valve) stenosis: Secondary | ICD-10-CM

## 2011-06-06 DIAGNOSIS — I359 Nonrheumatic aortic valve disorder, unspecified: Secondary | ICD-10-CM

## 2011-06-06 DIAGNOSIS — R1013 Epigastric pain: Secondary | ICD-10-CM

## 2011-06-06 NOTE — Patient Instructions (Signed)
Your physician has requested that you have an echocardiogram. Echocardiography is a painless test that uses sound waves to create images of your heart. It provides your doctor with information about the size and shape of your heart and how well your heart's chambers and valves are working. This procedure takes approximately one hour. There are no restrictions for this procedure.   Your physician recommends that you continue on your current medications as directed. Please refer to the Current Medication list given to you today.  Your physician wants you to follow-up in: 6 months. You will receive a reminder letter in the mail two months in advance. If you don't receive a letter, please call our office to schedule the follow-up appointment.  

## 2011-06-06 NOTE — Assessment & Plan Note (Signed)
Blood pressure has been remaining stable on current therapy. 

## 2011-06-06 NOTE — Progress Notes (Signed)
[name] Stevenson Date of Birth:  09-12-1932 [facility] [address] [place], [place]  [address] [phone]         Fax   [phone]  History of Present Illness: This pleasant 78 year old gentleman is seen for a six-month followup office visit.  He has a complex past medical history.  He has a history of coronary atherosclerotic heart disease and had a previous cardiac catheterization showing nonobstructive coronary disease.  He has a history of sick sinus syndrome with tachybradycardia syndrome and he has a functioning dual-chamber pacemaker.  He has a remote history of lung cancer treated by Dr. Cyndra Numbers.  There has been no evidence of recurrence.  The patient has severe COPD and is on home oxygen at 3 L per minute around-the-clock.  He has a history of sleep apnea and uses a CPAP machine at night patient also has a history of macular degeneration and a history of diabetes mellitus and a history of dyslipidemia  Current Outpatient Prescriptions  Medication Sig Dispense Refill  . ALBUTEROL IN Inhale into the lungs as needed.        . ALPRAZolam (XANAX) 0.5 MG tablet Take 1 tablet (0.5 mg total) by mouth 3 (three) times daily as needed.  90 tablet  5  . aspirin 81 MG tablet Take 81 mg by mouth daily.        Marland Kitchen atorvastatin (LIPITOR) 40 MG tablet Take 1 tablet (40 mg total) by mouth daily.  90 tablet  3  . budesonide-formoterol (SYMBICORT) 160-4.5 MCG/ACT inhaler Inhale 2 puffs into the lungs 2 (two) times daily.        . citalopram (CELEXA) 10 MG tablet Take 1 tablet (10 mg total) by mouth daily.  90 tablet  3  . colchicine 0.6 MG tablet Take 1 tablet (0.6 mg total) by mouth daily.  120 tablet  11  . diltiazem (TIAZAC) 240 MG 24 hr capsule Take 1 capsule (240 mg total) by mouth daily.  90 capsule  3  . esomeprazole (NEXIUM) 40 MG capsule Take 1 capsule (40 mg total) by mouth daily before breakfast.  90 capsule  3  . fish oil-omega-3 fatty acids 1000 MG capsule Take 2 g by  mouth daily.        . furosemide (LASIX) 40 MG tablet Take 40 mg by mouth daily. 40mg  am and 20mg  in the afternoon      . hydrALAZINE (APRESOLINE) 25 MG tablet Take 1 tablet (25 mg total) by mouth 2 (two) times daily.  180 tablet  3  . Mag Aspart-Potassium Aspart 250-250 MG TABS Take 1 tablet by mouth 2 (two) times daily.        . Magnesium Oxide (DIASENSE MAGNESIUM) 400 (241.3 MG) MG TABS Take 1 tablet by mouth daily.       . metFORMIN (GLUCOPHAGE) 500 MG tablet Take 1 tablet (500 mg total) by mouth daily.  120 tablet  prn  . metoprolol tartrate (LOPRESSOR) 50 MG tablet Take 1 tablet (50 mg total) by mouth 2 (two) times daily.  180 tablet  3  . Multiple Vitamins-Minerals (EYE VITAMINS) TABS Take 1 tablet by mouth 2 (two) times daily.        . multivitamin (THERAGRAN) per tablet Take 1 tablet by mouth daily.        Marland Kitchen omeprazole (PRILOSEC) 20 MG capsule Take 1 capsule (20 mg total) by mouth daily.  30 capsule  3  . Probiotic Product (RESTORA PO) Take by mouth  at bedtime.        . temazepam (RESTORIL) 30 MG capsule Take 1 capsule (30 mg total) by mouth at bedtime as needed for sleep.  30 capsule  2  . tiotropium (SPIRIVA HANDIHALER) 18 MCG inhalation capsule Place 1 capsule (18 mcg total) into inhaler and inhale daily.  30 capsule  2  . vitamin B-12 (CYANOCOBALAMIN) 100 MCG tablet Take 50 mcg by mouth daily.        . vitamin E 600 UNIT capsule Take 600 Units by mouth 2 (two) times daily.          No Known Allergies  Patient Active Problem List  Diagnoses  . CARCINOMA, LUNG, SQUAMOUS CELL  . CORONARY ARTERY DISEASE  . CHRONIC OBSTRUCTIVE PULMONARY DISEASE, SEVERE  . GERD  . HYPERSOMNIA WITH SLEEP APNEA UNSPECIFIED  . Hypoxemia  . Pacemaker  . Hypercholesterolemia  . Hyperuricemia  . Benign hypertensive heart disease without heart failure  . Dysphagia  . Nonspecific (abnormal) findings on radiological and other examination of gastrointestinal tract  . Preventative health care  . Anxiety   . Depression  . Chronic pain  . Macular degeneration  . OSA (obstructive sleep apnea)  . Basal cell carcinoma of nose  . HTN (hypertension)  . Increased prostate specific antigen (PSA) velocity  . DM (diabetes mellitus)  . Gout    History  Smoking status  . Former Smoker -- 1.5 packs/day for 50 years  . Types: Cigarettes  . Quit date: 06/28/1996  Smokeless tobacco  . Current User  . Types: Chew    History  Alcohol Use No    Family History  Problem Relation Age of Onset  . Heart disease Father   . Lung cancer Mother   . Cancer Other     lung cancer  . Heart disease Other   . Hypertension Other     Review of Systems: Constitutional: no fever chills diaphoresis or fatigue or change in weight.  Head and neck: no hearing loss, no epistaxis, no photophobia or visual disturbance. Respiratory: No cough, shortness of breath or wheezing. Cardiovascular: No chest pain peripheral edema, palpitations. Gastrointestinal: No abdominal distention, no abdominal pain, no change in bowel habits hematochezia or melena. Genitourinary: No dysuria, no frequency, no urgency, no nocturia. Musculoskeletal:No arthralgias, no back pain, no gait disturbance or myalgias. Neurological: No dizziness, no headaches, no numbness, no seizures, no syncope, no weakness, no tremors. Hematologic: No lymphadenopathy, no easy bruising. Psychiatric: No confusion, no hallucinations, no sleep disturbance.    Physical Exam: Filed Vitals:   06/06/11 1417  BP: 128/88  Pulse: 78   the general appearance reveals a gentleman who is on nasal oxygen.  He is somewhat plethoric.  He is sedentary and is examined in the chair.Pupils equal and reactive.   Extraocular Movements are full.  There is no scleral icterus.  The mouth and pharynx are normal.  The neck is supple.  The carotids reveal no bruits.  The jugular venous pressure is normal.  The thyroid is not enlarged.  There is no lymphadenopathy.   the chest  reveals scattered expiratory wheezes and rhonchi.  The heart reveals a grade 1/6 systolic ejection murmur at the base.  The abdomen is obese nontender without hepatosplenomegaly or masses extremities reveal bilateral lower extremity edema worse on the rightStrength is normal and symmetrical in all extremities.  There is no lateralizing weakness.  There are no sensory deficits.  The skin is warm and dry.  There is no  rash.    Assessment / Plan: The patient is to continue same medication.  He was having more dyspnea which I think is probably primarily pulmonary rather than cardiac.  He does have a known murmur of aortic stenosis with his last echo being on 02/02/09.  We will update his echo.  He will continue same medications.  He will be rechecked for a followup office visit and fasting lab work in 6 months.

## 2011-06-06 NOTE — Assessment & Plan Note (Signed)
The patient has a history of hypercholesterolemia and is on Lipitor 40 mg daily.  He is not having any myalgias from Lipitor.  We will plan to recheck his lipids in 6 months

## 2011-06-06 NOTE — Assessment & Plan Note (Signed)
The patient has a functioning pacemaker.  He has not been aware of any recent palpitations or tachycardia.  He has had no dizzy spells or syncope.

## 2011-06-06 NOTE — Assessment & Plan Note (Signed)
The patient has severe COPD and is oxygen dependent.  Over Christmas he had bronchitis and was treated by his pulmonologist successfully.

## 2011-06-10 ENCOUNTER — Ambulatory Visit: Payer: Medicare Other | Admitting: Emergency Medicine

## 2011-06-13 ENCOUNTER — Other Ambulatory Visit: Payer: Self-pay

## 2011-06-13 ENCOUNTER — Ambulatory Visit (HOSPITAL_COMMUNITY): Payer: Medicare Other | Attending: Cardiovascular Disease

## 2011-06-13 DIAGNOSIS — J449 Chronic obstructive pulmonary disease, unspecified: Secondary | ICD-10-CM | POA: Insufficient documentation

## 2011-06-13 DIAGNOSIS — I495 Sick sinus syndrome: Secondary | ICD-10-CM | POA: Insufficient documentation

## 2011-06-13 DIAGNOSIS — R0989 Other specified symptoms and signs involving the circulatory and respiratory systems: Secondary | ICD-10-CM | POA: Insufficient documentation

## 2011-06-13 DIAGNOSIS — G4730 Sleep apnea, unspecified: Secondary | ICD-10-CM | POA: Insufficient documentation

## 2011-06-13 DIAGNOSIS — E785 Hyperlipidemia, unspecified: Secondary | ICD-10-CM | POA: Insufficient documentation

## 2011-06-13 DIAGNOSIS — Z87891 Personal history of nicotine dependence: Secondary | ICD-10-CM | POA: Insufficient documentation

## 2011-06-13 DIAGNOSIS — I10 Essential (primary) hypertension: Secondary | ICD-10-CM | POA: Insufficient documentation

## 2011-06-13 DIAGNOSIS — Q251 Coarctation of aorta: Secondary | ICD-10-CM | POA: Insufficient documentation

## 2011-06-13 DIAGNOSIS — C3490 Malignant neoplasm of unspecified part of unspecified bronchus or lung: Secondary | ICD-10-CM | POA: Insufficient documentation

## 2011-06-13 DIAGNOSIS — I2510 Atherosclerotic heart disease of native coronary artery without angina pectoris: Secondary | ICD-10-CM | POA: Insufficient documentation

## 2011-06-13 DIAGNOSIS — J4489 Other specified chronic obstructive pulmonary disease: Secondary | ICD-10-CM | POA: Insufficient documentation

## 2011-06-13 DIAGNOSIS — I350 Nonrheumatic aortic (valve) stenosis: Secondary | ICD-10-CM

## 2011-06-13 DIAGNOSIS — R0609 Other forms of dyspnea: Secondary | ICD-10-CM | POA: Insufficient documentation

## 2011-06-13 DIAGNOSIS — E119 Type 2 diabetes mellitus without complications: Secondary | ICD-10-CM | POA: Insufficient documentation

## 2011-06-13 DIAGNOSIS — I359 Nonrheumatic aortic valve disorder, unspecified: Secondary | ICD-10-CM | POA: Insufficient documentation

## 2011-06-17 ENCOUNTER — Telehealth: Payer: Self-pay | Admitting: Cardiology

## 2011-06-17 MED ORDER — FUROSEMIDE 40 MG PO TABS: 80.0000 mg | ORAL_TABLET | Freq: Two times a day (BID) | ORAL | Status: DC

## 2011-06-17 NOTE — Telephone Encounter (Signed)
Please return call to patient wife Inetta Fermo [phone]   Patient had Echo 3/28, wife calling for results. Inetta Fermo can be reached at [phone].

## 2011-06-17 NOTE — Telephone Encounter (Signed)
Spoke with pt wife, aware of echo results. Refill on lasix given

## 2011-06-18 ENCOUNTER — Telehealth: Payer: Self-pay | Admitting: Cardiology

## 2011-06-18 MED ORDER — FUROSEMIDE 40 MG PO TABS: 40.0000 mg | ORAL_TABLET | Freq: Two times a day (BID) | ORAL | Status: DC

## 2011-06-18 NOTE — Telephone Encounter (Signed)
Per patients wife, she had to increase his Lasix from 40 mg in am and 20 mg in pm to 40 mg twice a day.  States she is keeping legs elevated.

## 2011-06-18 NOTE — Telephone Encounter (Signed)
OK to use higher dose of Lasix

## 2011-06-18 NOTE — Telephone Encounter (Signed)
Please return call   Pharmacy [address], [facility] - [place], [place] - [address], [phone]  Please verify Lasix Prescription for patient.

## 2011-06-20 ENCOUNTER — Encounter: Payer: Self-pay | Admitting: Internal Medicine

## 2011-06-20 ENCOUNTER — Ambulatory Visit (INDEPENDENT_AMBULATORY_CARE_PROVIDER_SITE_OTHER): Payer: Medicare Other | Admitting: Internal Medicine

## 2011-06-20 VITALS — BP 120/78 | HR 83 | Ht 67.0 in | Wt 214.2 lb

## 2011-06-20 DIAGNOSIS — Z950 Presence of cardiac pacemaker: Secondary | ICD-10-CM

## 2011-06-20 DIAGNOSIS — I495 Sick sinus syndrome: Secondary | ICD-10-CM

## 2011-06-20 DIAGNOSIS — I4891 Unspecified atrial fibrillation: Secondary | ICD-10-CM

## 2011-06-20 DIAGNOSIS — I2510 Atherosclerotic heart disease of native coronary artery without angina pectoris: Secondary | ICD-10-CM

## 2011-06-20 LAB — PACEMAKER DEVICE OBSERVATION
AL AMPLITUDE: 1.7091 mv
AL IMPEDENCE PM: 568 Ohm
BAMS-0001: 170 {beats}/min
BATTERY VOLTAGE: 2.99 V
RV LEAD AMPLITUDE: 16.809 mv
RV LEAD IMPEDENCE PM: 456 Ohm
VENTRICULAR PACING PM: 1.97

## 2011-06-20 NOTE — Assessment & Plan Note (Signed)
95% atrially paced

## 2011-06-20 NOTE — Patient Instructions (Signed)
Your physician recommends that you continue on your current medications as directed. Please refer to the Current Medication list given to you today.     

## 2011-06-20 NOTE — Assessment & Plan Note (Signed)
Identified on his pacemaker with episodes up to 3 hours. His thromboembolic risk profile would justify long-term anticoagulation; however, the duration of his sufficient to justify this is not clear. We'll look for 12-24 hours of atrial fibrillation prior to initiation. Until that he will continue on aspirin

## 2011-06-20 NOTE — Assessment & Plan Note (Signed)
Nonobstructive disease by catheterization 2006

## 2011-06-20 NOTE — Progress Notes (Signed)
HPI  [name] Stevenson is a 78 y.o. male SEEN to establish pacemaker followup for [employer] device Implanted 2010 for symptomatic bradycardia He also has a history of AFib And normal LV function by echo at that time and again by repeat 3/13 He is reported to have had EF 45% in the past, at the time of stress testing in 2006 which was assoc with cath demonstrating non obstructive  CAD  Past Medical History  Diagnosis Date  . COPD (chronic obstructive pulmonary disease)   . Cancer     lung s/p lobectomy  . Stricture and stenosis of esophagus 12/07/2004    EGD done on 12/07/2004  . GERD (gastroesophageal reflux disease)   . Coronary artery disease     non obstructive cath 2006  . Hypoxemia   . Depression   . Hypertension   . OSA (obstructive sleep apnea) 02/26/2011  . Basal cell carcinoma of nose 02/26/2011  . HTN (hypertension) 02/26/2011  . Increased prostate specific antigen (PSA) velocity 02/26/2011  . DM (diabetes mellitus) 02/26/2011  . Gout 02/26/2011    Past Surgical History  Procedure Date  . Hernia repair     x2  . Lung lobectomy   . Knee arthroscopy   . Visual merchandiser   . Tonsillectomy     Current Outpatient Prescriptions  Medication Sig Dispense Refill  . ALBUTEROL IN Inhale into the lungs as needed.        . ALPRAZolam (XANAX) 0.5 MG tablet Take 1 tablet (0.5 mg total) by mouth 3 (three) times daily as needed.  90 tablet  5  . aspirin 81 MG tablet Take 81 mg by mouth daily.        Marland Kitchen atorvastatin (LIPITOR) 40 MG tablet Take 1 tablet (40 mg total) by mouth daily.  90 tablet  3  . budesonide-formoterol (SYMBICORT) 160-4.5 MCG/ACT inhaler Inhale 2 puffs into the lungs 2 (two) times daily.        . citalopram (CELEXA) 10 MG tablet Take 1 tablet (10 mg total) by mouth daily.  90 tablet  3  . colchicine 0.6 MG tablet Take 1 tablet (0.6 mg total) by mouth daily.  120 tablet  11  . diltiazem (TIAZAC) 240 MG 24 hr capsule Take 1 capsule (240 mg total) by mouth daily.  90  capsule  3  . esomeprazole (NEXIUM) 40 MG capsule Take 1 capsule (40 mg total) by mouth daily before breakfast.  90 capsule  3  . fish oil-omega-3 fatty acids 1000 MG capsule Take 2 g by mouth daily.        . furosemide (LASIX) 40 MG tablet Take 1 tablet (40 mg total) by mouth 2 (two) times daily.  180 tablet  3  . hydrALAZINE (APRESOLINE) 25 MG tablet Take 1 tablet (25 mg total) by mouth 2 (two) times daily.  180 tablet  3  . Mag Aspart-Potassium Aspart 250-250 MG TABS Take 1 tablet by mouth 2 (two) times daily.        . Magnesium Oxide (DIASENSE MAGNESIUM) 400 (241.3 MG) MG TABS Take 1 tablet by mouth daily.       . metFORMIN (GLUCOPHAGE) 500 MG tablet Take 1 tablet (500 mg total) by mouth daily.  120 tablet  prn  . metoprolol tartrate (LOPRESSOR) 50 MG tablet Take 1 tablet (50 mg total) by mouth 2 (two) times daily.  180 tablet  3  . Multiple Vitamins-Minerals (EYE VITAMINS) TABS Take 1 tablet by mouth 2 (two) times daily.        Marland Kitchen  multivitamin (THERAGRAN) per tablet Take 1 tablet by mouth daily.        Marland Kitchen omeprazole (PRILOSEC) 20 MG capsule Take 1 capsule (20 mg total) by mouth daily.  30 capsule  3  . Probiotic Product (RESTORA PO) Take by mouth at bedtime.        . temazepam (RESTORIL) 30 MG capsule Take 1 capsule (30 mg total) by mouth at bedtime as needed for sleep.  30 capsule  2  . tiotropium (SPIRIVA HANDIHALER) 18 MCG inhalation capsule Place 1 capsule (18 mcg total) into inhaler and inhale daily.  30 capsule  2  . vitamin B-12 (CYANOCOBALAMIN) 100 MCG tablet Take 50 mcg by mouth daily.        . vitamin E 600 UNIT capsule Take 600 Units by mouth 2 (two) times daily.          No Known Allergies  Review of Systems negative except from HPI and PMH  Physical Exam BP 120/78  Pulse 83  Ht 5\' 7"  (1.702 m)  Wt 214 lb 3.2 oz (97.16 kg)  BMI 33.55 kg/m2 Well developed and well nourished in no acute distress wearing oxygen HENT normal E scleral and icterus clear Neck Supple JVP flat;  carotids brisk and full Diffuse wheezes Regular rate and rhythm, no murmurs gallops or rub Soft with active bowel sounds No clubbing cyanosis 2+ Edema Alert and oriented, grossly normal motor and sensory function Skin Warm and Dry   Assessment and  Plan

## 2011-06-20 NOTE — Assessment & Plan Note (Signed)
The patient's device was interrogated.  The information was reviewed. No changes were made in the programming.    

## 2011-06-21 ENCOUNTER — Other Ambulatory Visit: Payer: Self-pay | Admitting: *Deleted

## 2011-06-21 ENCOUNTER — Telehealth: Payer: Self-pay | Admitting: Emergency Medicine

## 2011-06-21 MED ORDER — TIOTROPIUM BROMIDE MONOHYDRATE 18 MCG IN CAPS: 18.0000 ug | ORAL_CAPSULE | Freq: Every day | RESPIRATORY_TRACT | Status: DC

## 2011-06-21 MED ORDER — BUDESONIDE-FORMOTEROL FUMARATE 160-4.5 MCG/ACT IN AERO: 2.0000 | INHALATION_SPRAY | Freq: Two times a day (BID) | RESPIRATORY_TRACT | Status: DC

## 2011-06-21 NOTE — Telephone Encounter (Signed)
Refill of the symbicort has been sent to the pharmacy per pts request.  Called and lmom to make pt aware.

## 2011-08-15 ENCOUNTER — Other Ambulatory Visit: Payer: Self-pay

## 2011-08-15 DIAGNOSIS — G4700 Insomnia, unspecified: Secondary | ICD-10-CM

## 2011-08-15 MED ORDER — TEMAZEPAM 30 MG PO CAPS: 30.0000 mg | ORAL_CAPSULE | Freq: Every evening | ORAL | Status: DC | PRN

## 2011-08-15 NOTE — Telephone Encounter (Signed)
Done hardcopy to robin  

## 2011-08-16 NOTE — Telephone Encounter (Signed)
Faxed hardcopy to pharmacy. 

## 2011-08-28 ENCOUNTER — Encounter: Payer: Self-pay | Admitting: Internal Medicine

## 2011-08-28 ENCOUNTER — Ambulatory Visit (INDEPENDENT_AMBULATORY_CARE_PROVIDER_SITE_OTHER): Payer: Medicare Other | Admitting: Internal Medicine

## 2011-08-28 VITALS — BP 130/76 | HR 75 | Temp 97.9°F | Ht 67.0 in | Wt 222.2 lb

## 2011-08-28 DIAGNOSIS — F329 Major depressive disorder, single episode, unspecified: Secondary | ICD-10-CM

## 2011-08-28 DIAGNOSIS — Z0000 Encounter for general adult medical examination without abnormal findings: Secondary | ICD-10-CM

## 2011-08-28 DIAGNOSIS — I10 Essential (primary) hypertension: Secondary | ICD-10-CM

## 2011-08-28 DIAGNOSIS — F411 Generalized anxiety disorder: Secondary | ICD-10-CM

## 2011-08-28 DIAGNOSIS — F419 Anxiety disorder, unspecified: Secondary | ICD-10-CM

## 2011-08-28 DIAGNOSIS — E119 Type 2 diabetes mellitus without complications: Secondary | ICD-10-CM

## 2011-08-28 MED ORDER — ALPRAZOLAM 0.5 MG PO TABS: 0.5000 mg | ORAL_TABLET | Freq: Three times a day (TID) | ORAL | Status: DC | PRN

## 2011-08-28 MED ORDER — HYDROCODONE-ACETAMINOPHEN 7.5-500 MG PO TABS: 1.0000 | ORAL_TABLET | Freq: Four times a day (QID) | ORAL | Status: DC | PRN

## 2011-08-28 NOTE — Patient Instructions (Addendum)
Continue all other medications as before You are given the refills as requested Please keep your appointments with your specialists as you have planned Please return in 6 mo with Lab testing done 3-5 days before

## 2011-08-29 ENCOUNTER — Ambulatory Visit (INDEPENDENT_AMBULATORY_CARE_PROVIDER_SITE_OTHER): Payer: Medicare Other | Admitting: Emergency Medicine

## 2011-08-29 ENCOUNTER — Encounter: Payer: Self-pay | Admitting: Emergency Medicine

## 2011-08-29 VITALS — BP 140/82 | HR 80 | Temp 98.2°F | Ht 67.0 in | Wt 219.4 lb

## 2011-08-29 DIAGNOSIS — J449 Chronic obstructive pulmonary disease, unspecified: Secondary | ICD-10-CM

## 2011-08-29 DIAGNOSIS — G4733 Obstructive sleep apnea (adult) (pediatric): Secondary | ICD-10-CM

## 2011-08-29 MED ORDER — BUDESONIDE-FORMOTEROL FUMARATE 160-4.5 MCG/ACT IN AERO: 2.0000 | INHALATION_SPRAY | Freq: Two times a day (BID) | RESPIRATORY_TRACT | Status: DC

## 2011-08-29 MED ORDER — ALBUTEROL SULFATE HFA 108 (90 BASE) MCG/ACT IN AERS: 2.0000 | INHALATION_SPRAY | Freq: Four times a day (QID) | RESPIRATORY_TRACT | Status: DC | PRN

## 2011-08-29 NOTE — Progress Notes (Signed)
Subjective:    Patient ID: [name] Stevenson, male    DOB: 1933/01/23, 78 y.o.   MRN: [id number]  HPI 78 yo man, with severe COPD and chronic hypoxemia. He also has a history of non-small cell lung cancer, status post right upper lobectomy and adjuvant chemotherapy in 2006.   ROV 07/25/09 -- f/u for COPD, OSA. Last time we tried stopping Advair to see if it helped his throat irritation. he has not restarted it, throat feels better. He may be having more exertional SOB since stopping the Advair. Tolerates Spiriva.   ROV 10/25/09 -- COPD and OSA. Tells me has been feeling well. Last Ct scan showed no evidence recurrance of his lung CA. Breathing has been OK, the heat has limited his activity. Last time we changed Advair to Symbicort - has helped his irritated throat, has also made his breathing a bit better. Never had to stop his Lisinopril. No new problems, no exacerbations.   ROV 05/04/10 -- Hx COPD and OSA, also hx NSCLCA and RUL lobectomy. Reports that he was treated for AE-COPD with abx and pred. Has been on Spiriva and Symbicort, cut back on the symbicort due to cost. Has been using once daily instead of two times a day. Had the pneumovax 7 yrs ago, shouldn't tneed it again. Having CT scans of the chest followed by Dr Welton Flakes, annually. Wears CPAP every night.   ROV 10/19/10 -- COPD, OSA, NSCLCA s/p RUL lobectomy. He had been getting CT scans annually, planning to see Dr Welton Flakes. May be having some decreased energy since February. He has been having difficulty with his swallowing - some dysphagia, food getting stuck. Sometimes causes him to get choked. Wears CPAP mask reliably.  Only using Symbicort daily, Spiriva.   03/20/2011 Pt ill for one week.  Rx levaquin x 5days 750/d. Not improved.  Severe dyspnea at rest. Not able to walk. No real chest pain.  Notes more wheezing.  Severe dyspnea even at rest.  No real fever T99.7  At home.  Did get flu vaccine in November. >>RX Avelox x 7 days , steroid taper.XR w/  no acute changes   Follow up  Pt returns for a 1 week follow up for COPD flare. Seen in office last week, tx with Avelox and steroid taper. CXR with no acute process last ov.  Feeling some better but still has a lot of coughing and congestion  Finished Avelox yesterday. Has few days left on prednisone.  No hemotpysis or chest pain .   ROV 04/18/11 -- Severe COPD, OSA, NSCLCA s/p RUL lobectomy. He was seen beginning of the month for an exacerbation following probable URI, CXR showed no infiltrates. He was treated w steroids and abx. He is improved, close to baseline. Using Spiriva and Symbicort.   ROV 06/05/11 -- Severe COPD, OSA, NSCLCA s/p RUL lobectomy. He still hasn't gotten back to prior baseline since URI and AE. He is on 3L/min pulsed. He does exert but w difficulty. He is on Spiriva + Symbicort.   ROV 08/29/11 -- Severe COPD, OSA, NSCLCA s/p RUL lobectomy. He has remained active, still limited w exertion. He is on Spiriva + Symbicort. No exacerbations or pred, no abx. No cough or wheeze. He is having more LE edema, has started on lasix. Wears his CPAP reliably      Objective:   Physical Exam  Filed Vitals:   08/29/11 1540  BP: 140/82  Pulse: 80  Temp: 98.2 F (36.8 C)  TempSrc: Oral  Height: 5\' 7"  (1.702 m)  Weight: 219 lb 6.4 oz (99.519 kg)  SpO2: 93%   Gen: Pleasant, elderly   normal affect, on nasal oxygen  ENT: No lesions,  mouth clear,  oropharynx clear, no postnasal drip  Neck: No JVD, no TMG, no carotid bruits  Lungs: No use of accessory muscles, no dullness to percussion,  No wheezing  Cardiovascular: RRR, heart sounds normal, no murmur or gallops, 1+  peripheral edema  Musculoskeletal: No deformities, no cyanosis or clubbing  Neuro: alert, non focal  Skin: Warm, no lesions or rashes      Assessment & Plan:   OSA (obstructive sleep apnea) Reliably uses CPAP  CHRONIC OBSTRUCTIVE PULMONARY DISEASE, SEVERE Continue same BD regimjen rov 3 months

## 2011-08-29 NOTE — Patient Instructions (Addendum)
Please continue your inhaled medications as you are taking them  Wear your CPAP and oxygen We will have the company check your CPAP to insure in good repair Follow with Dr [name] in 3 months or sooner if you have any problems.

## 2011-08-29 NOTE — Assessment & Plan Note (Signed)
Reliably uses CPAP

## 2011-08-29 NOTE — Assessment & Plan Note (Signed)
Continue same BD regimjen rov 3 months

## 2011-09-01 ENCOUNTER — Encounter: Payer: Self-pay | Admitting: Internal Medicine

## 2011-09-01 NOTE — Progress Notes (Signed)
Subjective:    Patient ID: [name] Stevenson, male    DOB: 07/16/1932, 78 y.o.   MRN: [id number]  HPI  Here to f/u with wife who gives most of the hx;  Overall doing ok,  Pt denies chest pain, increased sob or doe, wheezing, orthopnea, PND, increased LE swelling (overall stable pedal edema no change), palpitations, dizziness or syncope.   Pt denies polydipsia, polyuria, or low sugar symptoms such as weakness or confusion improved with po intake.  Pt states overall good compliance with meds, trying to follow lower cholesterol, diabetic diet, wt overall stable.  Denies worsening depressive symptoms, suicidal ideation, or panic, though has ongoing anxiety, not increased recently, needs xanax refill. Pt denies new neurological symptoms such as new headache, or facial or extremity weakness or numbness  Has dermatology appt later this wk.  Sees optho regularly  - has recent onset macular degeneration getting the shots.  To start at a gym for exercise next wk.    Past Medical History  Diagnosis Date  . COPD (chronic obstructive pulmonary disease)   . Cancer     lung s/p lobectomy  . Stricture and stenosis of esophagus 12/07/2004    EGD done on 12/07/2004  . GERD (gastroesophageal reflux disease)   . Coronary artery disease     non obstructive cath 2006  . Hypoxemia   . Depression   . Hypertension   . OSA (obstructive sleep apnea) 02/26/2011  . Basal cell carcinoma of nose 02/26/2011  . HTN (hypertension) 02/26/2011  . Increased prostate specific antigen (PSA) velocity 02/26/2011  . DM (diabetes mellitus) 02/26/2011  . Gout 02/26/2011  . Atrial fibrillation    Past Surgical History  Procedure Date  . Hernia repair     x2  . Lung lobectomy   . Knee arthroscopy   . Visual merchandiser   . Tonsillectomy     reports that he quit smoking about 15 years ago. His smoking use included Cigarettes. He has a 75 pack-year smoking history. His smokeless tobacco use includes Chew. He reports that he does not drink  alcohol or use illicit drugs. family history includes Cancer in his other; Heart disease in his father and other; Hypertension in his other; and Lung cancer in his mother. No Known Allergies Current Outpatient Prescriptions on File Prior to Visit  Medication Sig Dispense Refill  . ALBUTEROL IN Inhale into the lungs as needed.        . ALPRAZolam (XANAX) 0.5 MG tablet Take 1 tablet (0.5 mg total) by mouth 3 (three) times daily as needed.  90 tablet  5  . aspirin 81 MG tablet Take 81 mg by mouth daily.        Marland Kitchen atorvastatin (LIPITOR) 40 MG tablet Take 1 tablet (40 mg total) by mouth daily.  90 tablet  3  . citalopram (CELEXA) 10 MG tablet Take 1 tablet (10 mg total) by mouth daily.  90 tablet  3  . colchicine 0.6 MG tablet Take 1 tablet (0.6 mg total) by mouth daily.  120 tablet  11  . diltiazem (TIAZAC) 240 MG 24 hr capsule Take 1 capsule (240 mg total) by mouth daily.  90 capsule  3  . esomeprazole (NEXIUM) 40 MG capsule Take 1 capsule (40 mg total) by mouth daily before breakfast.  90 capsule  3  . fish oil-omega-3 fatty acids 1000 MG capsule Take 2 g by mouth daily.        . furosemide (LASIX) 40 MG tablet  Take 1 tablet (40 mg total) by mouth 2 (two) times daily.  180 tablet  3  . hydrALAZINE (APRESOLINE) 25 MG tablet Take 1 tablet (25 mg total) by mouth 2 (two) times daily.  180 tablet  3  . Mag Aspart-Potassium Aspart 250-250 MG TABS Take 1 tablet by mouth 2 (two) times daily.        . Magnesium Oxide (DIASENSE MAGNESIUM) 400 (241.3 MG) MG TABS Take 1 tablet by mouth daily.       . metFORMIN (GLUCOPHAGE) 500 MG tablet Take 1 tablet (500 mg total) by mouth daily.  120 tablet  prn  . metoprolol tartrate (LOPRESSOR) 50 MG tablet Take 1 tablet (50 mg total) by mouth 2 (two) times daily.  180 tablet  3  . Multiple Vitamins-Minerals (EYE VITAMINS) TABS Take 1 tablet by mouth 2 (two) times daily.        . multivitamin (THERAGRAN) per tablet Take 1 tablet by mouth daily.        Marland Kitchen omeprazole  (PRILOSEC) 20 MG capsule Take 1 capsule (20 mg total) by mouth daily.  30 capsule  3  . Probiotic Product (RESTORA PO) Take by mouth at bedtime.        . temazepam (RESTORIL) 30 MG capsule Take 1 capsule (30 mg total) by mouth at bedtime as needed for sleep.  30 capsule  2  . tiotropium (SPIRIVA HANDIHALER) 18 MCG inhalation capsule Place 1 capsule (18 mcg total) into inhaler and inhale daily.  30 capsule  2  . vitamin B-12 (CYANOCOBALAMIN) 100 MCG tablet Take 50 mcg by mouth daily.        . vitamin E 600 UNIT capsule Take 600 Units by mouth 2 (two) times daily.        Marland Kitchen albuterol (PROVENTIL HFA;VENTOLIN HFA) 108 (90 BASE) MCG/ACT inhaler Inhale 2 puffs into the lungs every 6 (six) hours as needed for wheezing.  1 Inhaler  6  . budesonide-formoterol (SYMBICORT) 160-4.5 MCG/ACT inhaler Inhale 2 puffs into the lungs 2 (two) times daily.  1 Inhaler  6  . temazepam (RESTORIL) 30 MG capsule Take 1 capsule (30 mg total) by mouth at bedtime as needed for sleep.  30 capsule  3   Review of Systems Review of Systems  Constitutional: Negative for diaphoresis and unexpected weight change.  HENT: Negative for drooling and tinnitus.   Eyes: Negative for photophobia and visual disturbance.  Respiratory: Negative for choking and stridor.   Gastrointestinal: Negative for vomiting and blood in stool.  Genitourinary: Negative for hematuria and decreased urine volume.  Musculoskeletal: Negative for joint swelling acute Skin: Negative for color change and wound.  Neurological: Negative for numbness.  Psychiatric/Behavioral: Negative for decreased concentration. The patient is not hyperactive.       Objective:   Physical Exam BP 130/76  Pulse 75  Temp 97.9 F (36.6 C) (Oral)  Ht 5\' 7"  (1.702 m)  Wt 222 lb 4 oz (100.812 kg)  BMI 34.81 kg/m2  SpO2 95% Physical Exam  VS noted Constitutional: Pt appears well-developed and well-nourished.  HENT: Head: Normocephalic.  Right Ear: External ear normal.  Left  Ear: External ear normal.  Eyes: Conjunctivae and EOM are normal. Pupils are equal, round, and reactive to light.  Neck: Normal range of motion. Neck supple.  Cardiovascular: Normal rate and regular rhythm.   Pulmonary/Chest: Effort normal and breath sounds decreased, no rales or wheezing Neurological: Pt is alert. Motor intact  Skin: Skin is warm. No erythema. Has  trace bilat pedal edema Psychiatric: 1+ nervous, not depressed appaering    Assessment & Plan:

## 2011-09-01 NOTE — Assessment & Plan Note (Signed)
stable overall by hx and exam, most recent data reviewed with pt, and pt to continue medical treatment as before Lab Results  Component Value Date   WBC 12.7* 02/26/2011   HGB 13.2 02/26/2011   HCT 40.4 02/26/2011   PLT 280.0 02/26/2011   GLUCOSE 108* 02/26/2011   CHOL 154 02/26/2011   TRIG 66.0 02/26/2011   HDL 51.60 02/26/2011   LDLCALC 89 02/26/2011   ALT 22 02/26/2011   AST 28 02/26/2011   NA 139 02/26/2011   K 4.4 02/26/2011   CL 103 02/26/2011   CREATININE 1.4 02/26/2011   BUN 24* 02/26/2011   CO2 27 02/26/2011   TSH 0.93 02/26/2011   PSA 2.71 02/26/2011   INR 1.09 02/09/2009   HGBA1C 6.1 02/26/2011   MICROALBUR 46.5* 02/26/2011

## 2011-09-01 NOTE — Assessment & Plan Note (Signed)
stable overall by hx and exam, most recent data reviewed with pt, and pt to continue medical treatment as before BP Readings from Last 3 Encounters:  08/29/11 140/82  08/28/11 130/76  06/20/11 120/78

## 2011-09-01 NOTE — Assessment & Plan Note (Signed)
stable overall by hx and exam, most recent data reviewed with pt, and pt to continue medical treatment as before  Lab Results  Component Value Date   HGBA1C 6.1 02/26/2011    

## 2011-09-01 NOTE — Assessment & Plan Note (Signed)
stable overall by hx and exam,, and pt to continue medical treatment as before, med refilled today 

## 2011-09-04 ENCOUNTER — Telehealth: Payer: Self-pay | Admitting: Internal Medicine

## 2011-09-04 DIAGNOSIS — F419 Anxiety disorder, unspecified: Secondary | ICD-10-CM

## 2011-09-04 MED ORDER — ALPRAZOLAM 0.5 MG PO TABS: 0.5000 mg | ORAL_TABLET | Freq: Three times a day (TID) | ORAL | Status: DC | PRN

## 2011-09-04 MED ORDER — HYDROCODONE-ACETAMINOPHEN 7.5-500 MG PO TABS: 1.0000 | ORAL_TABLET | Freq: Four times a day (QID) | ORAL | Status: AC | PRN

## 2011-09-04 NOTE — Telephone Encounter (Signed)
Caller: Violet/Spouse; PCP: [name]; CB#: [phone]; Call regarding Lost Rx; pt was seen in the office on 08/28/11 and Rx was written for Hydrocodone and Xanax; wife has misplaced these Rx; wife is upset stating that she has never done this before and she is not a drug user she honestly misplaced the Rx; pt would like to come to the office to pick up another hard copy of the Rx; OFFICE PLEASE REVIEW AND CALL WIFE BACK WITH FURTHER INSTRUCTIONS

## 2011-09-04 NOTE — Telephone Encounter (Signed)
Called the patient informed prescriptions refilled per MD.  [name] Stevenson both hardcopies to [facility] pharmacy per pt. Request.

## 2011-09-04 NOTE — Telephone Encounter (Signed)
[place] controlled sub registry reviewed  I judge low likelihood of med diversion or abuse  OK for replacement rx - Done hardcopy to robin

## 2011-09-16 ENCOUNTER — Other Ambulatory Visit: Payer: Self-pay

## 2011-09-16 MED ORDER — METOPROLOL TARTRATE 50 MG PO TABS: 50.0000 mg | ORAL_TABLET | Freq: Two times a day (BID) | ORAL | Status: DC

## 2011-09-18 ENCOUNTER — Telehealth: Payer: Self-pay | Admitting: Internal Medicine

## 2011-09-18 ENCOUNTER — Other Ambulatory Visit: Payer: Self-pay

## 2011-09-18 DIAGNOSIS — E119 Type 2 diabetes mellitus without complications: Secondary | ICD-10-CM

## 2011-09-18 DIAGNOSIS — E7800 Pure hypercholesterolemia, unspecified: Secondary | ICD-10-CM

## 2011-09-18 MED ORDER — METOPROLOL TARTRATE 50 MG PO TABS: 50.0000 mg | ORAL_TABLET | Freq: Two times a day (BID) | ORAL | Status: DC

## 2011-09-18 NOTE — Telephone Encounter (Signed)
Notified pharmacy spoke with amy gave md response. She stated already received script for 50 mg and wife pick up med yesterday, but wanted to let md know that wife states she will continue to give him 100 mg.... 09/18/11@5 :12pm/LMB

## 2011-09-18 NOTE — Telephone Encounter (Signed)
Please inform the wife to not do this  If she could recall, until May 02, 2011 he HAD been taking lopressor 12.5 mg at 2 tabs bid (=25 mg bid = total 50 mg per day)  BUT, May 02, 2011 his rx was changed to the current which is 50 mg twice per day (for total of 100 mg per day which is MORE medication is less number of pills  If she were to give him 2 tabs bid of the 50 mg , this would be a total of 200mg  which could harm Mr Bagot with low BP or low HR -  So PLEASE dont do this

## 2011-09-18 NOTE — Telephone Encounter (Signed)
This change to 100 mg bid is an error, if that is what was called in;  There was never any intent to do this  High dose beta blocker can exacerbate lung function as well  Pt should be on 50 mg BID only - the rx was done per emr

## 2011-09-18 NOTE — Telephone Encounter (Signed)
Informed the pharmacy and pharmacist stated the patients wife needs to be informed of change as states he has always been given 2 bid

## 2011-09-18 NOTE — Telephone Encounter (Signed)
[facility] called the triage line to clarify refill request.

## 2011-09-18 NOTE — Telephone Encounter (Signed)
1 bid  rx re-sent

## 2011-09-18 NOTE — Telephone Encounter (Signed)
Called the pharmacist and she checked back in their records.  On Feb. 14, 2013 the prescription was phoned in by [facility] for 50 mg 2 bid #120 and has continued to get this amount. Please advise.

## 2011-09-18 NOTE — Telephone Encounter (Signed)
[facility] needs clarification on metoprolol.  Please clarify 1 BID or 2 BID. Call back number to [facility] is [phone]

## 2011-09-19 NOTE — Telephone Encounter (Signed)
Zella Ball to let wife know, pt only needs 50 bid as of last visit, should only be taking this dose, despite what she may have been doing

## 2011-09-20 ENCOUNTER — Encounter: Payer: Medicare Other | Admitting: *Deleted

## 2011-09-23 ENCOUNTER — Encounter: Payer: Self-pay | Admitting: *Deleted

## 2011-09-23 ENCOUNTER — Telehealth: Payer: Self-pay

## 2011-09-23 MED ORDER — ATORVASTATIN CALCIUM 40 MG PO TABS: 40.0000 mg | ORAL_TABLET | Freq: Every day | ORAL | Status: DC

## 2011-09-23 MED ORDER — TIOTROPIUM BROMIDE MONOHYDRATE 18 MCG IN CAPS: 18.0000 ug | ORAL_CAPSULE | Freq: Every day | RESPIRATORY_TRACT | Status: DC

## 2011-09-23 MED ORDER — FUROSEMIDE 40 MG PO TABS: 40.0000 mg | ORAL_TABLET | Freq: Two times a day (BID) | ORAL | Status: DC

## 2011-09-23 MED ORDER — OMEPRAZOLE 20 MG PO CPDR: 20.0000 mg | DELAYED_RELEASE_CAPSULE | Freq: Every day | ORAL | Status: DC

## 2011-09-23 MED ORDER — METFORMIN HCL 500 MG PO TABS: 500.0000 mg | ORAL_TABLET | Freq: Every day | ORAL | Status: DC

## 2011-09-23 MED ORDER — COLCHICINE 0.6 MG PO TABS: 0.6000 mg | ORAL_TABLET | Freq: Every day | ORAL | Status: DC

## 2011-09-23 MED ORDER — CITALOPRAM HYDROBROMIDE 10 MG PO TABS: 10.0000 mg | ORAL_TABLET | Freq: Every day | ORAL | Status: DC

## 2011-09-23 MED ORDER — ESOMEPRAZOLE MAGNESIUM 40 MG PO CPDR: 40.0000 mg | DELAYED_RELEASE_CAPSULE | Freq: Every day | ORAL | Status: DC

## 2011-09-23 MED ORDER — DILTIAZEM HCL ER BEADS 240 MG PO CP24: 240.0000 mg | ORAL_CAPSULE | Freq: Every day | ORAL | Status: DC

## 2011-09-23 NOTE — Telephone Encounter (Signed)
Pharmacy received refill on Nexium and Prilosec.  Please advise do you want patient on both, please clarify?

## 2011-09-23 NOTE — Telephone Encounter (Signed)
Noted; to cont nexium per pt preference  Note to be closed

## 2011-09-23 NOTE — Telephone Encounter (Signed)
Ok  - to [employer] as per office refill policy

## 2011-09-23 NOTE — Telephone Encounter (Signed)
Patients wife informed and requested refills on all medications.

## 2011-09-23 NOTE — Telephone Encounter (Signed)
Spoke to the patients wife and he takes Nexium.  Removed prilosec from med list and informed pharmacy as well. Patient has plenty nexium at this time and does not need a refill.

## 2011-09-23 NOTE — Telephone Encounter (Signed)
Ok for [employer], robin to remove nexium  Ok to reverse if pt wants nexium instead of the prilosec

## 2011-09-23 NOTE — Telephone Encounter (Signed)
Called left message to call back 

## 2011-09-27 ENCOUNTER — Ambulatory Visit (INDEPENDENT_AMBULATORY_CARE_PROVIDER_SITE_OTHER): Payer: Medicare Other | Admitting: *Deleted

## 2011-09-27 DIAGNOSIS — I495 Sick sinus syndrome: Secondary | ICD-10-CM

## 2011-09-28 ENCOUNTER — Encounter: Payer: Self-pay | Admitting: Internal Medicine

## 2011-10-01 ENCOUNTER — Other Ambulatory Visit: Payer: Self-pay

## 2011-10-01 MED ORDER — FUROSEMIDE 40 MG PO TABS: 40.0000 mg | ORAL_TABLET | Freq: Two times a day (BID) | ORAL | Status: DC

## 2011-10-01 NOTE — Telephone Encounter (Signed)
..   Requested Prescriptions   Signed Prescriptions Disp Refills  . furosemide (LASIX) 40 MG tablet 180 tablet 3    Sig: Take 1 tablet (40 mg total) by mouth 2 (two) times daily.    Authorizing Provider: Cassell Clement    Ordering User: Christella Hartigan, Mica Ramdass Judie Petit

## 2011-10-02 ENCOUNTER — Other Ambulatory Visit: Payer: Self-pay

## 2011-10-02 MED ORDER — METOPROLOL TARTRATE 50 MG PO TABS: 50.0000 mg | ORAL_TABLET | Freq: Two times a day (BID) | ORAL | Status: DC

## 2011-10-04 LAB — REMOTE PACEMAKER DEVICE
BATTERY VOLTAGE: 2.99 V
BRDY-0003RA: 120 {beats}/min
BRDY-0004RA: 120 {beats}/min

## 2011-10-21 ENCOUNTER — Telehealth: Payer: Self-pay | Admitting: Internal Medicine

## 2011-10-21 MED ORDER — OMEPRAZOLE 20 MG PO CPDR: 20.0000 mg | DELAYED_RELEASE_CAPSULE | Freq: Every day | ORAL | Status: DC

## 2011-10-21 NOTE — Telephone Encounter (Signed)
rx sent

## 2011-10-22 ENCOUNTER — Telehealth: Payer: Self-pay | Admitting: *Deleted

## 2011-10-22 ENCOUNTER — Other Ambulatory Visit (INDEPENDENT_AMBULATORY_CARE_PROVIDER_SITE_OTHER): Payer: Medicare Other

## 2011-10-22 DIAGNOSIS — K921 Melena: Secondary | ICD-10-CM

## 2011-10-22 DIAGNOSIS — R195 Other fecal abnormalities: Secondary | ICD-10-CM

## 2011-10-22 LAB — CBC WITH DIFFERENTIAL/PLATELET
Basophils Absolute: 0 10*3/uL (ref 0.0–0.1)
Basophils Relative: 0.3 % (ref 0.0–3.0)
Eosinophils Absolute: 0.2 10*3/uL (ref 0.0–0.7)
HCT: 38.4 % — ABNORMAL LOW (ref 39.0–52.0)
Hemoglobin: 12.5 g/dL — ABNORMAL LOW (ref 13.0–17.0)
Lymphocytes Relative: 20 % (ref 12.0–46.0)
Lymphs Abs: 3.1 10*3/uL (ref 0.7–4.0)
MCHC: 32.5 g/dL (ref 30.0–36.0)
MCV: 98.7 fl (ref 78.0–100.0)
Monocytes Absolute: 1.6 10*3/uL — ABNORMAL HIGH (ref 0.1–1.0)
Neutro Abs: 10.4 10*3/uL — ABNORMAL HIGH (ref 1.4–7.7)
RBC: 3.89 Mil/uL — ABNORMAL LOW (ref 4.22–5.81)
RDW: 13.8 % (ref 11.5–14.6)

## 2011-10-22 NOTE — Telephone Encounter (Signed)
Spoke with patient's wife and they are coming now for lab. Orders in EPIC.

## 2011-10-22 NOTE — Telephone Encounter (Signed)
Spoke with patient's wife. States patient has had black stools x 2 weeks. Denies pain, iron supplements, blood thinners or Pepto bismol.last colon- 06/30/2006-diverticulosis, polyp, external hem.  Last EGd 11/01/10- stircture, esophagitis Scheduled patient to see Willette Cluster, NP on 10/24/11 at 2:00 PM.

## 2011-10-22 NOTE — Telephone Encounter (Signed)
He ought to have CBC stat this afternoon.

## 2011-10-24 ENCOUNTER — Other Ambulatory Visit (INDEPENDENT_AMBULATORY_CARE_PROVIDER_SITE_OTHER): Payer: Medicare Other

## 2011-10-24 ENCOUNTER — Other Ambulatory Visit: Payer: Self-pay | Admitting: *Deleted

## 2011-10-24 ENCOUNTER — Encounter: Payer: Self-pay | Admitting: Nurse Practitioner

## 2011-10-24 ENCOUNTER — Ambulatory Visit (INDEPENDENT_AMBULATORY_CARE_PROVIDER_SITE_OTHER): Payer: Medicare Other | Admitting: Nurse Practitioner

## 2011-10-24 ENCOUNTER — Telehealth: Payer: Self-pay | Admitting: Emergency Medicine

## 2011-10-24 VITALS — BP 124/68 | HR 76 | Ht 66.0 in | Wt 223.1 lb

## 2011-10-24 DIAGNOSIS — K921 Melena: Secondary | ICD-10-CM

## 2011-10-24 LAB — CBC WITH DIFFERENTIAL/PLATELET
Lymphocytes Relative: 19.8 % (ref 12.0–46.0)
Lymphs Abs: 3 10*3/uL (ref 0.7–4.0)
MCHC: 32.8 g/dL (ref 30.0–36.0)
Monocytes Absolute: 1.3 10*3/uL — ABNORMAL HIGH (ref 0.1–1.0)
Monocytes Relative: 8.7 % (ref 3.0–12.0)
Neutro Abs: 10.5 10*3/uL — ABNORMAL HIGH (ref 1.4–7.7)
Neutrophils Relative %: 70.1 % (ref 43.0–77.0)
Platelets: 351 10*3/uL (ref 150.0–400.0)
RDW: 14 % (ref 11.5–14.6)

## 2011-10-24 NOTE — Progress Notes (Signed)
[name] Stevenson [id number] 04/09/1932   HISTORY OR PRESENT ILLNESS :  Patient is a 78-year-old male known to Dr. Brodie for history of diverticulosis, GERD complicated by esophageal strictures (2006). He has a remote history of 2 knee symptoms complications. Patient was last seen August 2012 for evaluation of dysphasia. He has been worked in today for evaluating of black stool.Patient takes a baby aspirin daily. He takes a daily PPI. He is not on any anticoagulants/antiplatelet medications. Patient's wife accompanies him today and helps with history. Patient is hard of hearing. He has had black stools for about a week ago today bowel movement was normal. No abdominal pain. No nausea. He does not take iron products or any bismuth products. Last colonoscopy April 2008, done for occult bleeding. Findings included diverticulosis, external hemorrhoids and a hyperplastic polyp.  Current Medications, Allergies, Past Medical History, Past Surgical History, Family History and Social History were reviewed in [facility] electronic medical record.   PHYSICAL EXAMINATION : General:  Well developed  male in no acute distress Head: Normocephalic and atraumatic Eyes:  sclerae anicteric,conjunctive pink. Ears: Normal auditory acuity Neck: Supple, no masses.  Lungs: Clear throughout to auscultation Heart: Regular rate and rhythm Abdomen: Soft, nondistended, nontender. No masses or hepatomegaly noted. Normal bowel sounds Rectal: light brown, Hemoccult positive stool. Musculoskeletal: Symmetrical with no gross deformities  Skin: No lesions on visible extremities Extremities: No edema or deformities noted Neurological: Oriented x 4, grossly nonfocal Cervical Nodes:  No significant cervical adenopathy Psychological:  Alert and cooperative. Normal mood and affect  ASSESSMENT AND PLAN :  1. Black stool x 1 week. CBC 2 days ago shows that hemoglobin down slightly 12.5. No overt bleeding today but stools  positive for blood. Rule out erosive disease, peptic ulcer disease, AVMs.  Will recheck a CBC today for stability. Patient will be scheduled for an upper endoscopy tomorrow at the hospital with Dr. Brodie.The benefits, risks, and potential complications of EGD with possible biopsies and/or dilation were discussed with the patient and he agrees to proceed.   2. multiple medical problems as listed above.  3. colon cancer screening. Patient believes that he is due for screening exam. That will be addressed after resolution of #1.     2. COPD, O2 dependent  3. Multiple medical problems, including, but not limited to, diabetes, coronary artery disease, atrial fibrillation.  

## 2011-10-24 NOTE — Patient Instructions (Addendum)
No allergy to soy or eggs Please go to the basement level to have your labs drawn.  We have scheduled the Endoscopy with Dr [name]. Location: [facility] Endoscopy Unit.  Upper GI Endoscopy Upper GI endoscopy means using a flexible scope to look at the esophagus, stomach, and upper small bowel. This is done to make a diagnosis in people with heartburn, abdominal pain, or abnormal bleeding. Sometimes an endoscope is needed to remove foreign bodies or food that become stuck in the esophagus; it can also be used to take biopsy samples. For the best results, do not eat or drink for 8 hours before having your upper endoscopy.  To perform the endoscopy, you will probably be sedated and your throat will be numbed with a special spray. The endoscope is then slowly passed down your throat (this will not interfere with your breathing). An endoscopy exam takes 15 to 30 minutes to complete and there is no real pain. Patients rarely remember much about the procedure. The results of the test may take several days if a biopsy or other test is taken.  You may have a sore throat after an endoscopy exam. Serious complications are very rare. Stick to liquids and soft foods until your pain is better. Do not drive a car or operate any dangerous equipment for at least 24 hours after being sedated. SEEK IMMEDIATE MEDICAL CARE IF:   You have severe throat pain.   You have shortness of breath.   You have bleeding problems.   You have a fever.   You have difficulty recovering from your sedation.  Document Released: 04/11/2004 Document Revised: 02/21/2011 Document Reviewed: 03/06/2008 [facility] Patient Information 2012 [place], [place].

## 2011-10-24 NOTE — Telephone Encounter (Signed)
lmomtcb x1 

## 2011-10-25 ENCOUNTER — Ambulatory Visit (HOSPITAL_COMMUNITY)
Admission: RE | Admit: 2011-10-25 | Discharge: 2011-10-25 | Disposition: A | Discharge status: Hospice | Payer: Medicare Other | Source: Ambulatory Visit | Attending: Internal Medicine | Admitting: Internal Medicine

## 2011-10-25 ENCOUNTER — Encounter (HOSPITAL_COMMUNITY)
Admission: RE | Disposition: A | Discharge status: Hospice | Payer: Self-pay | Source: Ambulatory Visit | Attending: Internal Medicine

## 2011-10-25 ENCOUNTER — Other Ambulatory Visit: Payer: Self-pay | Admitting: Internal Medicine

## 2011-10-25 ENCOUNTER — Encounter (HOSPITAL_COMMUNITY): Payer: Self-pay

## 2011-10-25 DIAGNOSIS — Z79899 Other long term (current) drug therapy: Secondary | ICD-10-CM | POA: Insufficient documentation

## 2011-10-25 DIAGNOSIS — J449 Chronic obstructive pulmonary disease, unspecified: Secondary | ICD-10-CM | POA: Insufficient documentation

## 2011-10-25 DIAGNOSIS — K921 Melena: Secondary | ICD-10-CM

## 2011-10-25 DIAGNOSIS — K449 Diaphragmatic hernia without obstruction or gangrene: Secondary | ICD-10-CM | POA: Insufficient documentation

## 2011-10-25 DIAGNOSIS — K2210 Ulcer of esophagus without bleeding: Secondary | ICD-10-CM | POA: Insufficient documentation

## 2011-10-25 DIAGNOSIS — J4489 Other specified chronic obstructive pulmonary disease: Secondary | ICD-10-CM | POA: Insufficient documentation

## 2011-10-25 DIAGNOSIS — K219 Gastro-esophageal reflux disease without esophagitis: Secondary | ICD-10-CM

## 2011-10-25 HISTORY — DX: Cardiac arrest, cause unspecified: I46.9

## 2011-10-25 HISTORY — PX: ESOPHAGOGASTRODUODENOSCOPY: SHX5428

## 2011-10-25 SURGERY — EGD (ESOPHAGOGASTRODUODENOSCOPY)
Anesthesia: Moderate Sedation

## 2011-10-25 MED ORDER — FENTANYL CITRATE 0.05 MG/ML IJ SOLN
INTRAMUSCULAR | Status: DC | PRN
  Administered 2011-10-25: 15 ug via INTRAVENOUS
  Administered 2011-10-25: 10 ug via INTRAVENOUS
  Administered 2011-10-25: 25 ug via INTRAVENOUS

## 2011-10-25 MED ORDER — MIDAZOLAM HCL 10 MG/2ML IJ SOLN
INTRAMUSCULAR | Status: DC | PRN
  Administered 2011-10-25: 1 mg via INTRAVENOUS
  Administered 2011-10-25: 2 mg via INTRAVENOUS
  Administered 2011-10-25 (×2): 1 mg via INTRAVENOUS

## 2011-10-25 MED ORDER — FENTANYL CITRATE 0.05 MG/ML IJ SOLN
INTRAMUSCULAR | Status: AC
  Filled 2011-10-25: qty 2

## 2011-10-25 MED ORDER — MIDAZOLAM HCL 10 MG/2ML IJ SOLN
INTRAMUSCULAR | Status: AC
  Filled 2011-10-25: qty 2

## 2011-10-25 MED ORDER — SODIUM CHLORIDE 0.9 % IV SOLN
INTRAVENOUS | Status: DC
  Administered 2011-10-25: 500 mL via INTRAVENOUS

## 2011-10-25 NOTE — H&P (View-Only) (Signed)
[name] Stevenson [id number] [birth year]   HISTORY OR PRESENT ILLNESS :  Patient is a 78 year old male known to Dr. Juanda Chance for history of diverticulosis, GERD complicated by esophageal strictures (2006). He has a remote history of 2 knee symptoms complications. Patient was last seen August 2012 for evaluation of dysphasia. He has been worked in today for evaluating of black stool.Patient takes a baby aspirin daily. He takes a daily PPI. He is not on any anticoagulants/antiplatelet medications. Patient's wife accompanies him today and helps with history. Patient is hard of hearing. He has had black stools for about a week ago today bowel movement was normal. No abdominal pain. No nausea. He does not take iron products or any bismuth products. Last colonoscopy April 2008, done for occult bleeding. Findings included diverticulosis, external hemorrhoids and a hyperplastic polyp.  Current Medications, Allergies, Past Medical History, Past Surgical History, Family History and Social History were reviewed in [employer] record.   PHYSICAL EXAMINATION : General:  Well developed  male in no acute distress Head: Normocephalic and atraumatic Eyes:  sclerae anicteric,conjunctive pink. Ears: Normal auditory acuity Neck: Supple, no masses.  Lungs: Clear throughout to auscultation Heart: Regular rate and rhythm Abdomen: Soft, nondistended, nontender. No masses or hepatomegaly noted. Normal bowel sounds Rectal: light brown, Hemoccult positive stool. Musculoskeletal: Symmetrical with no gross deformities  Skin: No lesions on visible extremities Extremities: No edema or deformities noted Neurological: Oriented x 4, grossly nonfocal Cervical Nodes:  No significant cervical adenopathy Psychological:  Alert and cooperative. Normal mood and affect  ASSESSMENT AND PLAN :  1. Black stool x 1 week. CBC 2 days ago shows that hemoglobin down slightly 12.5. No overt bleeding today but stools  positive for blood. Rule out erosive disease, peptic ulcer disease, AVMs.  Will recheck a CBC today for stability. Patient will be scheduled for an upper endoscopy tomorrow at the hospital with Dr. Juanda Chance.The benefits, risks, and potential complications of EGD with possible biopsies and/or dilation were discussed with the patient and he agrees to proceed.   2. multiple medical problems as listed above.  3. colon cancer screening. Patient believes that he is due for screening exam. That will be addressed after resolution of #1.     2. COPD, O2 dependent  3. Multiple medical problems, including, but not limited to, diabetes, coronary artery disease, atrial fibrillation.

## 2011-10-25 NOTE — Progress Notes (Signed)
Reviewed and agree with management. Lonald Troiani D. Tyliah Schlereth, [name], FACG  

## 2011-10-25 NOTE — Telephone Encounter (Signed)
Pt came in yesterday for samples

## 2011-10-25 NOTE — Op Note (Signed)
[facility] [address] [place], [place]  [address]  ENDOSCOPY PROCEDURE REPORT  PATIENT:  [name] Stevenson, [name] Stevenson  MR#:  [id number] BIRTHDATE:  10-06-1932, 79 yrs. old  GENDER:  male  ENDOSCOPIST:  Hedwig Morton. Juanda Chance, MD Referred by:  Cassell Clement, [name]  PROCEDURE DATE:  10/25/2011 PROCEDURE:  EGD, diagnostic 43235 ASA CLASS:  Class III INDICATIONS:  GERD, melena dark stools x1 week, Hgb did not drop significantly- 12.5 heme positive stool on exam hx of es. stricture on EGD 2006 and 10/2010 severe COPD  MEDICATIONS:   These medications were titrated to patient response per physician's verbal order, Versed 55 mg, Fentanyl 50 mcg TOPICAL ANESTHETIC:  Cetacaine Spray  DESCRIPTION OF PROCEDURE:   After the risks benefits and alternatives of the procedure were thoroughly explained, informed consent was obtained.  The Pentax Gastroscope [id number] endoscope was introduced through the mouth and advanced to the second portion of the duodenum, without limitations.  The instrument was slowly withdrawn as the mucosa was fully examined. <<PROCEDUREIMAGES>>  An ulcer was found in the distal esophagus (see image8). 4 mm superficial ulceration distal esophagus, no stigmata of bleeding A hiatal hernia was found (see image7 and image1). 2 cm sliding hiatal hernia  Bile reflux was found (see image2 and image5). Otherwise the examination was normal (see image6, image4, and image3). no evidence of esophageal stricture    Retroflexed views revealed no abnormalities.    The scope was then withdrawn from the patient and the procedure completed.  COMPLICATIONS:  None  ENDOSCOPIC IMPRESSION: 1) Ulcer in the distal esophagus, no evidence of recurrent stricture 2) Hiatal hernia, reducible, 2 cm 3) Bile reflux 4) Otherwise normal examination no active or recent bleeding, reflux esophagitis/small ulcer likely cause of heme positive stool RECOMMENDATIONS: 1) Anti-reflux regimen to be follow he  is on Nexiem 40 mg but it is too expensive, will switch to Prilosec 20 mg qd- bid last colon 2008, high risk for sedation  REPEAT EXAM:  In 0 year(s) for.  ______________________________ Hedwig Morton. Juanda Chance, MD  CC:  n. eSIGNED:   Hedwig Morton. Nery Frappier at 10/25/2011 11:28 AM  Jamse Belfast, [phone]

## 2011-10-25 NOTE — Interval H&P Note (Signed)
History and Physical Interval Note:  10/25/2011 10:35 AM  [name] Stevenson  has presented today for surgery, with the diagnosis of Blood in Stool, 578.1  The various methods of treatment have been discussed with the patient and family. After consideration of risks, benefits and other options for treatment, the patient has consented to  Procedure(s) (LRB): ESOPHAGOGASTRODUODENOSCOPY (EGD) (N/A) as a surgical intervention .  The patient's history has been reviewed, patient examined, no change in status, stable for surgery.  I have reviewed the patient's chart and labs.  Questions were answered to the patient's satisfaction.     [name]

## 2011-10-28 ENCOUNTER — Encounter (HOSPITAL_COMMUNITY): Payer: Self-pay | Admitting: Internal Medicine

## 2011-10-31 ENCOUNTER — Encounter: Payer: Self-pay | Admitting: *Deleted

## 2011-11-08 ENCOUNTER — Other Ambulatory Visit: Payer: Self-pay

## 2011-11-08 DIAGNOSIS — G4700 Insomnia, unspecified: Secondary | ICD-10-CM

## 2011-11-08 MED ORDER — TEMAZEPAM 30 MG PO CAPS: 30.0000 mg | ORAL_CAPSULE | Freq: Every evening | ORAL | Status: DC | PRN

## 2011-11-08 NOTE — Telephone Encounter (Signed)
Done hardcopy to robin  

## 2011-11-08 NOTE — Telephone Encounter (Signed)
Faxed hardcopy to pharmacy. 

## 2011-11-25 ENCOUNTER — Encounter: Payer: Self-pay | Admitting: Emergency Medicine

## 2011-11-27 ENCOUNTER — Other Ambulatory Visit: Payer: Self-pay | Admitting: *Deleted

## 2011-11-27 DIAGNOSIS — I119 Hypertensive heart disease without heart failure: Secondary | ICD-10-CM

## 2011-11-27 MED ORDER — HYDRALAZINE HCL 25 MG PO TABS: 25.0000 mg | ORAL_TABLET | Freq: Two times a day (BID) | ORAL | Status: DC

## 2011-11-27 NOTE — Telephone Encounter (Signed)
Refilled hydralazine

## 2011-11-28 ENCOUNTER — Ambulatory Visit (INDEPENDENT_AMBULATORY_CARE_PROVIDER_SITE_OTHER): Payer: Medicare Other | Admitting: Emergency Medicine

## 2011-11-28 ENCOUNTER — Encounter: Payer: Self-pay | Admitting: Emergency Medicine

## 2011-11-28 VITALS — BP 130/88 | HR 72 | Temp 98.8°F | Ht 67.0 in | Wt 221.4 lb

## 2011-11-28 DIAGNOSIS — Z23 Encounter for immunization: Secondary | ICD-10-CM

## 2011-11-28 DIAGNOSIS — G4733 Obstructive sleep apnea (adult) (pediatric): Secondary | ICD-10-CM

## 2011-11-28 DIAGNOSIS — M109 Gout, unspecified: Secondary | ICD-10-CM

## 2011-11-28 DIAGNOSIS — J449 Chronic obstructive pulmonary disease, unspecified: Secondary | ICD-10-CM

## 2011-11-28 MED ORDER — TIOTROPIUM BROMIDE MONOHYDRATE 18 MCG IN CAPS: 18.0000 ug | ORAL_CAPSULE | Freq: Every day | RESPIRATORY_TRACT | Status: DC

## 2011-11-28 MED ORDER — BUDESONIDE-FORMOTEROL FUMARATE 160-4.5 MCG/ACT IN AERO: 2.0000 | INHALATION_SPRAY | Freq: Two times a day (BID) | RESPIRATORY_TRACT | Status: DC

## 2011-11-28 MED ORDER — PREDNISONE 20 MG PO TABS: 40.0000 mg | ORAL_TABLET | Freq: Every day | ORAL | Status: AC

## 2011-11-28 NOTE — Assessment & Plan Note (Signed)
Continue same regimen - spiriva, symbicort, O2 at 3L/min ROV 3 mon

## 2011-11-28 NOTE — Assessment & Plan Note (Addendum)
Acute flare L great toe - pred ax 3 days - continue cochicine

## 2011-11-28 NOTE — Patient Instructions (Addendum)
Continue your inhaled medications Wear your oxygen at all times Flu shot today Take prednisone 40mg  daily for 3 days for gout Follow with Dr [name] in 3 months or sooner if you have any problems.

## 2011-11-28 NOTE — Assessment & Plan Note (Signed)
Continue current CPAP 

## 2011-11-28 NOTE — Progress Notes (Signed)
Subjective:    Patient ID: [name] Stevenson, male    DOB: 26-Apr-1932, 78 y.o.   MRN: [id number]  HPI 78 yo man, with severe COPD and chronic hypoxemia. He also has a history of non-small cell lung cancer, status post right upper lobectomy and adjuvant chemotherapy in 2006.   ROV 10/25/09 -- COPD and OSA. Tells me has been feeling well. Last Ct scan showed no evidence recurrance of his lung CA. Breathing has been OK, the heat has limited his activity. Last time we changed Advair to Symbicort - has helped his irritated throat, has also made his breathing a bit better. Never had to stop his Lisinopril. No new problems, no exacerbations.   ROV 05/04/10 -- Hx COPD and OSA, also hx NSCLCA and RUL lobectomy. Reports that he was treated for AE-COPD with abx and pred. Has been on Spiriva and Symbicort, cut back on the symbicort due to cost. Has been using once daily instead of two times a day. Had the pneumovax 7 yrs ago, shouldn't tneed it again. Having CT scans of the chest followed by Dr Welton Flakes, annually. Wears CPAP every night.   ROV 10/19/10 -- COPD, OSA, NSCLCA s/p RUL lobectomy. He had been getting CT scans annually, planning to see Dr Welton Flakes. May be having some decreased energy since February. He has been having difficulty with his swallowing - some dysphagia, food getting stuck. Sometimes causes him to get choked. Wears CPAP mask reliably.  Only using Symbicort daily, Spiriva.   03/20/2011 Pt ill for one week.  Rx levaquin x 5days 750/d. Not improved.  Severe dyspnea at rest. Not able to walk. No real chest pain.  Notes more wheezing.  Severe dyspnea even at rest.  No real fever T99.7  At home.  Did get flu vaccine in November. >>RX Avelox x 7 days , steroid taper.XR w/ no acute changes   Follow up  Pt returns for a 1 week follow up for COPD flare. Seen in office last week, tx with Avelox and steroid taper. CXR with no acute process last ov.  Feeling some better but still has a lot of coughing and congestion   Finished Avelox yesterday. Has few days left on prednisone.  No hemotpysis or chest pain .   ROV 04/18/11 -- Severe COPD, OSA, NSCLCA s/p RUL lobectomy. He was seen beginning of the month for an exacerbation following probable URI, CXR showed no infiltrates. He was treated w steroids and abx. He is improved, close to baseline. Using Spiriva and Symbicort.   ROV 06/05/11 -- Severe COPD, OSA, NSCLCA s/p RUL lobectomy. He still hasn't gotten back to prior baseline since URI and AE. He is on 3L/min pulsed. He does exert but w difficulty. He is on Spiriva + Symbicort.   ROV 08/29/11 -- Severe COPD, OSA, NSCLCA s/p RUL lobectomy. He has remained active, still limited w exertion. He is on Spiriva + Symbicort. No exacerbations or pred, no abx. No cough or wheeze. He is having more LE edema, has started on lasix. Wears his CPAP reliably  ROV 11/28/11 -- Severe COPD, OSA, NSCLCA s/p RUL lobectomy. He has been doing well, but probably less active this year. He and his wife have worried about his oxygen, the tanks. Once they ran out on a trip from [place] and they were panicked. He hasn't had any exacerbations, hospitalizations. Remains on Spiriva + Symbicort. He is having gout flare L great toe. He had EGD for anemia eval >> reassuring.  Objective:   Physical Exam  Filed Vitals:   11/28/11 1348  BP: 130/88  Pulse: 72  Temp: 98.8 F (37.1 C)  TempSrc: Oral  Height: 5\' 7"  (1.702 m)  Weight: 221 lb 6.4 oz (100.426 kg)  SpO2: 95%   Gen: Pleasant, elderly   normal affect, on nasal oxygen  ENT: No lesions,  mouth clear,  oropharynx clear, no postnasal drip  Neck: No JVD, no TMG, no carotid bruits  Lungs: No use of accessory muscles, no dullness to percussion,  No wheezing  Cardiovascular: RRR, heart sounds normal, no murmur or gallops, 1+  peripheral edema  Musculoskeletal: No deformities, no cyanosis or clubbing  Neuro: alert, non focal  Skin: Warm, no lesions or rashes, L great toe w  erythema, pain to touch      Assessment & Plan:   OSA (obstructive sleep apnea) Continue current CPAP   CHRONIC OBSTRUCTIVE PULMONARY DISEASE, SEVERE Continue same regimen - spiriva, symbicort, O2 at 3L/min ROV 3 mon  Gout attack Acute flare L great toe - pred ax 3 days - continue cochicine

## 2011-12-23 ENCOUNTER — Telehealth: Payer: Self-pay | Admitting: Oncology

## 2011-12-23 ENCOUNTER — Other Ambulatory Visit: Payer: Self-pay | Admitting: Oncology

## 2011-12-23 NOTE — Telephone Encounter (Signed)
gve the pt's wife the nov 2013 appt calendar

## 2011-12-24 ENCOUNTER — Telehealth: Payer: Self-pay | Admitting: Cardiology

## 2011-12-24 NOTE — Telephone Encounter (Signed)
Does go down some at night, avoids sodium, but having increased shortness of breath. Wife states he just doesn't seem to be feeling good and wanted him seen. Scheduled appointment with Dawayne Patricia NP for tomorrow

## 2011-12-24 NOTE — Telephone Encounter (Signed)
New Problem:    Patient's wife called in because her husband is still retaining a lot of fluid and he is up to taking 80 mg of furosemide (LASIX) 40 MG tablet but his legs still aren't going down.  Please call back.

## 2011-12-25 ENCOUNTER — Encounter: Payer: Self-pay | Admitting: Nurse Practitioner

## 2011-12-25 ENCOUNTER — Telehealth: Payer: Self-pay | Admitting: *Deleted

## 2011-12-25 ENCOUNTER — Ambulatory Visit (INDEPENDENT_AMBULATORY_CARE_PROVIDER_SITE_OTHER): Payer: Medicare Other | Admitting: Nurse Practitioner

## 2011-12-25 VITALS — BP 132/68 | HR 75 | Ht 67.0 in | Wt 223.0 lb

## 2011-12-25 DIAGNOSIS — Z79899 Other long term (current) drug therapy: Secondary | ICD-10-CM

## 2011-12-25 DIAGNOSIS — R0602 Shortness of breath: Secondary | ICD-10-CM

## 2011-12-25 DIAGNOSIS — R0600 Dyspnea, unspecified: Secondary | ICD-10-CM

## 2011-12-25 LAB — BASIC METABOLIC PANEL
BUN: 38 mg/dL — ABNORMAL HIGH (ref 6–23)
CO2: 28 mEq/L (ref 19–32)
Calcium: 8.9 mg/dL (ref 8.4–10.5)
Chloride: 99 mEq/L (ref 96–112)
Creatinine, Ser: 2.1 mg/dL — ABNORMAL HIGH (ref 0.4–1.5)
GFR: 32.88 mL/min — ABNORMAL LOW (ref >60.00)
Glucose, Bld: 83 mg/dL (ref 70–99)
Potassium: 4.3 mEq/L (ref 3.5–5.1)
Sodium: 137 mEq/L (ref 135–145)

## 2011-12-25 LAB — BRAIN NATRIURETIC PEPTIDE: Pro B Natriuretic peptide (BNP): 78 pg/mL (ref 0.0–100.0)

## 2011-12-25 MED ORDER — FUROSEMIDE 40 MG PO TABS: ORAL_TABLET | ORAL | Status: DC

## 2011-12-25 NOTE — Progress Notes (Signed)
[name] Stevenson Date of Birth: [birth year] Medical Record #[id number]  History of Present Illness: [name] Stevenson is seen back today for a work in visit. He is seen for Dr. Patty Sermons. He has multiple medical issues which include CAD, SSS with tachybrady and has a pacemaker in place, remote lung cancer, COPD on oxygen, sleep apnea on CPAP, DM, HLD, known aortic stenosis and macular degeneration.   He comes in today. He is here with his wife. They called yesterday complaining of swelling. His wife did not think he was doing all that well. She was worried. Says this has been going on since December. His weight is up. He is not active. Says they do not eat out or use salt. No support stockings. Taking NSAID every day. No chest pain. It does go down some overnight. Feels bloated. Early satiety reported. Right leg is always worse than the left. She has also made him an appointment to see Dr. Park Breed. She is worried about possible lung cancer recurrence. He does feel more short of breath.   Current Outpatient Prescriptions on File Prior to Visit  Medication Sig Dispense Refill  . albuterol (PROVENTIL HFA;VENTOLIN HFA) 108 (90 BASE) MCG/ACT inhaler Inhale 2 puffs into the lungs every 6 (six) hours as needed for wheezing.  1 Inhaler  6  . ALPRAZolam (XANAX) 0.5 MG tablet Take 1 tablet (0.5 mg total) by mouth 3 (three) times daily as needed.  90 tablet  5  . aspirin 81 MG tablet Take 81 mg by mouth daily.        Marland Kitchen atorvastatin (LIPITOR) 40 MG tablet Take 1 tablet (40 mg total) by mouth daily.  90 tablet  3  . budesonide-formoterol (SYMBICORT) 160-4.5 MCG/ACT inhaler Inhale 2 puffs into the lungs 2 (two) times daily.  1 Inhaler  0  . citalopram (CELEXA) 10 MG tablet Take 1 tablet (10 mg total) by mouth daily.  90 tablet  3  . colchicine 0.6 MG tablet Take 1 tablet (0.6 mg total) by mouth daily.  90 tablet  3  . diltiazem (TIAZAC) 240 MG 24 hr capsule Take 1 capsule (240 mg total) by mouth daily.  90 capsule  3  .  esomeprazole (NEXIUM) 40 MG capsule Take 1 capsule (40 mg total) by mouth daily before breakfast.  90 capsule  3  . fish oil-omega-3 fatty acids 1000 MG capsule Take 2 g by mouth daily.        . hydrALAZINE (APRESOLINE) 25 MG tablet Take 1 tablet (25 mg total) by mouth 2 (two) times daily.  180 tablet  3  . Mag Aspart-Potassium Aspart 250-250 MG TABS Take 1 tablet by mouth 2 (two) times daily.        . Magnesium Oxide (DIASENSE MAGNESIUM) 400 (241.3 MG) MG TABS Take 1 tablet by mouth daily.       . metFORMIN (GLUCOPHAGE) 500 MG tablet Take 1 tablet (500 mg total) by mouth daily.  90 tablet  3  . metoprolol (LOPRESSOR) 50 MG tablet Take 1 tablet (50 mg total) by mouth 2 (two) times daily.  180 tablet  1  . Multiple Vitamins-Minerals (EYE VITAMINS) TABS Take 1 tablet by mouth 2 (two) times daily.        . multivitamin (THERAGRAN) per tablet Take 1 tablet by mouth daily.        Marland Kitchen omeprazole (PRILOSEC) 20 MG capsule Take 1 capsule (20 mg total) by mouth daily.  90 capsule  1  . Probiotic Product (  RESTORA PO) Take by mouth at bedtime.        . temazepam (RESTORIL) 30 MG capsule Take 1 capsule (30 mg total) by mouth at bedtime as needed for sleep.  30 capsule  3  . tiotropium (SPIRIVA) 18 MCG inhalation capsule Place 1 capsule (18 mcg total) into inhaler and inhale daily.  30 capsule  0  . vitamin B-12 (CYANOCOBALAMIN) 100 MCG tablet Take 50 mcg by mouth daily.        . vitamin E 600 UNIT capsule Take 600 Units by mouth 2 (two) times daily.        Marland Kitchen DISCONTD: furosemide (LASIX) 40 MG tablet Take 1 tablet (40 mg total) by mouth 2 (two) times daily.  180 tablet  3    No Known Allergies  Past Medical History  Diagnosis Date  . Stricture and stenosis of esophagus 12/07/2004    EGD done on 12/07/2004  . GERD (gastroesophageal reflux disease)   . Hypoxemia   . Increased prostate specific antigen (PSA) velocity 02/26/2011  . DM (diabetes mellitus) 02/26/2011  . Gout 02/26/2011  . COPD (chronic  obstructive pulmonary disease)     oxygen at home, 3L 24/7  . OSA (obstructive sleep apnea) 02/26/2011    CPAP  . Atrial fibrillation   . Depression   . Cardiac arrest 2009    while in hospital   . Coronary artery disease     non obstructive cath 2006  . Hypertension   . HTN (hypertension) 02/26/2011  . Pacemaker   . Cardiac arrest 2009    while in hospital  . Cancer     lung s/p lobectomy x2 on RT lung  . Basal cell carcinoma of nose 02/26/2011    Past Surgical History  Procedure Date  . Lung lobectomy     x2 lobes on RT lung  . Knee arthroscopy     bilateral  . Pace maker   . Tonsillectomy   . Hernia repair     x2, esophageal  . Cardiac catheterization   . Esophagogastroduodenoscopy 10/25/2011    Procedure: ESOPHAGOGASTRODUODENOSCOPY (EGD);  Surgeon: Hart Carwin, MD;  Location: [facility];  Service: Endoscopy;  Laterality: N/A;    History  Smoking status  . Former Smoker -- 1.5 packs/day for 50 years  . Types: Cigarettes  . Quit date: 06/28/1996  Smokeless tobacco  . Current User  . Types: Chew    History  Alcohol Use No    Family History  Problem Relation Age of Onset  . Heart disease Father   . Lung cancer Mother   . Cancer Other     lung cancer  . Heart disease Other   . Hypertension Other     Review of Systems: The review of systems is per the HPI.  All other systems were reviewed and are negative.  Physical Exam: BP 132/68  Pulse 75  Ht 5\' 7"  (1.702 m)  Wt 223 lb (101.152 kg)  BMI 34.93 kg/m2  SpO2 97% Patient is very pleasant and in no acute distress. He is obese. Weight is up 9 pounds since last visit. Skin is warm and dry. Color is normal.  HEENT is unremarkable. Normocephalic/atraumatic. PERRL. Sclera are nonicteric. Neck is supple. No masses. No JVD. Lungs are clear. He has oxygen in place. Cardiac exam shows a regular rate and rhythm. Heart tones are distant. Abdomen is distended but soft. Extremities are with 1+ edema right greater  than left. Gait and ROM are intact.  No gross neurologic deficits noted.  LABORATORY DATA: BMET and BNP are pending for today.   Echo Study Conclusions from March 2013  - Left ventricle: The cavity size was normal. Systolic function was normal. The estimated ejection fraction was in the range of 55% to 60%. Wall motion was normal; there were no regional wall motion abnormalities. Doppler parameters are consistent with abnormal left ventricular relaxation (grade 1 diastolic dysfunction). - Aortic valve: There was very mild stenosis. Mild regurgitation. - Mitral valve: Mild regurgitation. - Left atrium: The atrium was moderately dilated. - Atrial septum: No defect or patent foramen ovale was identified. - Pulmonary arteries: PA peak pressure: 35mm Hg (S).  Lab Results  Component Value Date   WBC 15.0* 10/24/2011   HGB 12.4* 10/24/2011   HCT 38.0* 10/24/2011   PLT 351.0 10/24/2011   GLUCOSE 108* 02/26/2011   CHOL 154 02/26/2011   TRIG 66.0 02/26/2011   HDL 51.60 02/26/2011   LDLCALC 89 02/26/2011   ALT 22 02/26/2011   AST 28 02/26/2011   NA 139 02/26/2011   K 4.4 02/26/2011   CL 103 02/26/2011   CREATININE 1.4 02/26/2011   BUN 24* 02/26/2011   CO2 27 02/26/2011   TSH 0.93 02/26/2011   PSA 2.71 02/26/2011   INR 1.09 02/09/2009   HGBA1C 6.1 02/26/2011   MICROALBUR 46.5* 02/26/2011    Assessment / Plan:  1. Diastolic heart failure - weight is up. He has swelling but these seems to be a chronic finding. He is quite sedentary. We will check BNP and BMET today. Will increase his Lasix to 80 mg in the am and 40 mg in the pm for one week, then back to 40 mg BID. Needs to stop the NSAID. Would advise support stockings as well. We will get his recall visit with Dr. Patty Sermons set up for later this week.   2. COPD - severe. I suspect this is more the issue for his increased dyspnea.   3. HTN - blood pressure is ok. No change in his current therapy.   Call the [facility] office  at [phone] if you have any questions, problems or concerns.

## 2011-12-25 NOTE — Telephone Encounter (Signed)
Advised wife and scheduled labs, she will call pulmonary (patient see pulmonologist)

## 2011-12-25 NOTE — Telephone Encounter (Signed)
Message copied by Burnell Blanks on Wed Dec 25, 2011  3:34 PM ------      Message from: Rosalio Macadamia      Created: Wed Dec 25, 2011  2:54 PM       Ok to report. Fluid level is normal. His shortness of breath looks more pulmonary. I did increase his Lasix for a week. Recheck a BMET in 2 weeks.

## 2011-12-25 NOTE — Patient Instructions (Addendum)
Stop the Aleve. Use acetaminophen  Wear knee high support stockings during the day  Increase the Lasix to 2 pills in the morning and 1 pill in the afternoon. Do this for one week only, then go back to your old dose  We will check labs today  We will get your visit with Dr. Patty Sermons for later this month.  Call the [facility] office at [phone] if you have any questions, problems or concerns.

## 2012-01-06 ENCOUNTER — Encounter: Payer: Self-pay | Admitting: Internal Medicine

## 2012-01-06 ENCOUNTER — Ambulatory Visit (INDEPENDENT_AMBULATORY_CARE_PROVIDER_SITE_OTHER): Payer: Medicare Other | Admitting: *Deleted

## 2012-01-06 DIAGNOSIS — Z950 Presence of cardiac pacemaker: Secondary | ICD-10-CM

## 2012-01-06 DIAGNOSIS — I495 Sick sinus syndrome: Secondary | ICD-10-CM

## 2012-01-07 ENCOUNTER — Other Ambulatory Visit (INDEPENDENT_AMBULATORY_CARE_PROVIDER_SITE_OTHER): Payer: Medicare Other

## 2012-01-07 ENCOUNTER — Telehealth: Payer: Self-pay | Admitting: *Deleted

## 2012-01-07 DIAGNOSIS — Z79899 Other long term (current) drug therapy: Secondary | ICD-10-CM

## 2012-01-07 LAB — BASIC METABOLIC PANEL
BUN: 20 mg/dL (ref 6–23)
Calcium: 8.7 mg/dL (ref 8.4–10.5)
Chloride: 103 mEq/L (ref 96–112)
Creatinine, Ser: 1.8 mg/dL — ABNORMAL HIGH (ref 0.4–1.5)

## 2012-01-07 NOTE — Progress Notes (Signed)
Quick Note:  Please make copy of labs for patient visit. ______ 

## 2012-01-07 NOTE — Telephone Encounter (Signed)
Walk-in Patient. Wife states pt went to see his eye doctor today. The appointment was a 0800 so pt got up did not eat breakfast nor the medication this AM. According to wife pt's BP in the optalmology doctor was 178/100 to 200/100. BP take here in the office right arm 178/100 to 198/98. Norma Fredrickson NP recommended for pt to increased Hydralazine to 50 mg twice a day. Pt and wife are aware. Also they are aware for pt  to make sure to  take his medication before going out in the AM. Pt and wife verbalized understanding.

## 2012-01-08 ENCOUNTER — Other Ambulatory Visit: Payer: Medicare Other

## 2012-01-10 ENCOUNTER — Other Ambulatory Visit: Payer: Self-pay | Admitting: *Deleted

## 2012-01-10 DIAGNOSIS — I119 Hypertensive heart disease without heart failure: Secondary | ICD-10-CM

## 2012-01-10 LAB — REMOTE PACEMAKER DEVICE
AL IMPEDENCE PM: 520 Ohm
BATTERY VOLTAGE: 2.99 V
RV LEAD AMPLITUDE: 14.4 mv
RV LEAD IMPEDENCE PM: 464 Ohm

## 2012-01-10 MED ORDER — HYDRALAZINE HCL 25 MG PO TABS: 50.0000 mg | ORAL_TABLET | Freq: Two times a day (BID) | ORAL | Status: DC

## 2012-01-14 ENCOUNTER — Encounter: Payer: Self-pay | Admitting: Adult Health

## 2012-01-14 ENCOUNTER — Ambulatory Visit (INDEPENDENT_AMBULATORY_CARE_PROVIDER_SITE_OTHER): Payer: Medicare Other | Admitting: Adult Health

## 2012-01-14 VITALS — BP 128/66 | HR 86 | Temp 97.8°F | Ht 67.0 in | Wt 222.6 lb

## 2012-01-14 DIAGNOSIS — J449 Chronic obstructive pulmonary disease, unspecified: Secondary | ICD-10-CM

## 2012-01-14 MED ORDER — AMOXICILLIN-POT CLAVULANATE 875-125 MG PO TABS: 1.0000 | ORAL_TABLET | Freq: Two times a day (BID) | ORAL | Status: AC

## 2012-01-14 MED ORDER — PREDNISONE 10 MG PO TABS: ORAL_TABLET | ORAL | Status: DC

## 2012-01-14 MED ORDER — LEVALBUTEROL HCL 0.63 MG/3ML IN NEBU
0.6300 mg | INHALATION_SOLUTION | Freq: Once | RESPIRATORY_TRACT | Status: AC
  Administered 2012-01-14: 0.63 mg via RESPIRATORY_TRACT

## 2012-01-14 NOTE — Patient Instructions (Addendum)
Augmentin 875mg  Twice daily  For 7 days  Mucinex DM Twice daily  As needed   Prednisone taper over next week  Fluids and rest  Please contact office for sooner follow up if symptoms do not improve or worsen or seek emergency care  Follow up Dr. [name]  2 weeks and As needed

## 2012-01-16 ENCOUNTER — Encounter: Payer: Self-pay | Admitting: Cardiology

## 2012-01-16 ENCOUNTER — Ambulatory Visit (INDEPENDENT_AMBULATORY_CARE_PROVIDER_SITE_OTHER): Payer: Medicare Other | Admitting: Cardiology

## 2012-01-16 VITALS — BP 133/70 | HR 71 | Resp 18 | Ht 65.0 in | Wt 222.1 lb

## 2012-01-16 DIAGNOSIS — I119 Hypertensive heart disease without heart failure: Secondary | ICD-10-CM

## 2012-01-16 DIAGNOSIS — I5032 Chronic diastolic (congestive) heart failure: Secondary | ICD-10-CM

## 2012-01-16 DIAGNOSIS — J4489 Other specified chronic obstructive pulmonary disease: Secondary | ICD-10-CM

## 2012-01-16 DIAGNOSIS — J449 Chronic obstructive pulmonary disease, unspecified: Secondary | ICD-10-CM

## 2012-01-16 DIAGNOSIS — I495 Sick sinus syndrome: Secondary | ICD-10-CM

## 2012-01-16 NOTE — Progress Notes (Signed)
[name] Stevenson Date of Birth:  Jul 04, 1932 [facility] [address] [place], [place]  [address] [phone]         Fax   [phone]  History of Present Illness: This pleasant 78 year old gentleman is seen for a six-month followup office visit. He has a complex past medical history. He has a history of coronary atherosclerotic heart disease and had a previous cardiac catheterization showing nonobstructive coronary disease. He has a history of sick sinus syndrome with tachybradycardia syndrome and he has a functioning dual-chamber pacemaker.  He is followed for this by Dr. Graciela Husbands. He has a remote history of lung cancer treated by Dr. Edwyna Shell. There has been no evidence of recurrence. The patient has severe COPD and is on home oxygen at 3 L per minute around-the-clock. He has a history of sleep apnea and uses a CPAP machine at night patient also has a history of macular degeneration and a history of diabetes mellitus and a history of dyslipidemia.  Recently he was found to be running a higher blood pressure and his medications were recently adjusted upward.   Current Outpatient Prescriptions  Medication Sig Dispense Refill  . albuterol (PROVENTIL HFA;VENTOLIN HFA) 108 (90 BASE) MCG/ACT inhaler Inhale 2 puffs into the lungs every 6 (six) hours as needed for wheezing.  1 Inhaler  6  . ALPRAZolam (XANAX) 0.5 MG tablet Take 1 tablet (0.5 mg total) by mouth 3 (three) times daily as needed.  90 tablet  5  . amoxicillin-clavulanate (AUGMENTIN) 875-125 MG per tablet Take 1 tablet by mouth 2 (two) times daily.  14 tablet  0  . aspirin 81 MG tablet Take 81 mg by mouth daily.        Marland Kitchen atorvastatin (LIPITOR) 40 MG tablet Take 1 tablet (40 mg total) by mouth daily.  90 tablet  3  . budesonide-formoterol (SYMBICORT) 160-4.5 MCG/ACT inhaler Inhale 2 puffs into the lungs 2 (two) times daily.  1 Inhaler  0  . citalopram (CELEXA) 10 MG tablet Take 1 tablet (10 mg total) by mouth daily.  90  tablet  3  . colchicine 0.6 MG tablet Take 1 tablet (0.6 mg total) by mouth daily.  90 tablet  3  . diltiazem (TIAZAC) 240 MG 24 hr capsule Take 1 capsule (240 mg total) by mouth daily.  90 capsule  3  . esomeprazole (NEXIUM) 40 MG capsule Take 1 capsule (40 mg total) by mouth daily before breakfast.  90 capsule  3  . fish oil-omega-3 fatty acids 1000 MG capsule Take 2 g by mouth daily.        . furosemide (LASIX) 40 MG tablet Take two tabs (80mg ) in the am and one tab (40mg ) in the afternoon for one week, then back to 40 mg BID.  180 tablet  3  . hydrALAZINE (APRESOLINE) 25 MG tablet Take 2 tablets (50 mg total) by mouth 2 (two) times daily.  180 tablet  3  . Mag Aspart-Potassium Aspart 250-250 MG TABS Take 1 tablet by mouth 2 (two) times daily.        . Magnesium Oxide (DIASENSE MAGNESIUM) 400 (241.3 MG) MG TABS Take 1 tablet by mouth daily.       . metFORMIN (GLUCOPHAGE) 500 MG tablet Take 1 tablet (500 mg total) by mouth daily.  90 tablet  3  . metoprolol (LOPRESSOR) 50 MG tablet Take 1 tablet (50 mg total) by mouth 2 (two) times daily.  180 tablet  1  . Multiple  Vitamins-Minerals (EYE VITAMINS) TABS Take 1 tablet by mouth 2 (two) times daily.        . multivitamin (THERAGRAN) per tablet Take 1 tablet by mouth daily.        Marland Kitchen omeprazole (PRILOSEC) 20 MG capsule Take 1 capsule (20 mg total) by mouth daily.  90 capsule  1  . predniSONE (DELTASONE) 10 MG tablet 4 tabs for 2 days, then 3 tabs for 2 days, 2 tabs for 2 days, then 1 tab for 2 days, then stop  20 tablet  0  . Probiotic Product (RESTORA PO) Take by mouth at bedtime.        . temazepam (RESTORIL) 30 MG capsule Take 1 capsule (30 mg total) by mouth at bedtime as needed for sleep.  30 capsule  3  . tiotropium (SPIRIVA) 18 MCG inhalation capsule Place 1 capsule (18 mcg total) into inhaler and inhale daily.  30 capsule  0  . vitamin B-12 (CYANOCOBALAMIN) 100 MCG tablet Take 50 mcg by mouth daily.        . vitamin E 600 UNIT capsule Take 600  Units by mouth 2 (two) times daily.          No Known Allergies  Patient Active Problem List  Diagnosis  . CARCINOMA, LUNG, SQUAMOUS CELL  . CORONARY ARTERY DISEASE-nonobstructive disease 2006 catheterization  . CHRONIC OBSTRUCTIVE PULMONARY DISEASE, SEVERE  . GERD  . HYPERSOMNIA WITH SLEEP APNEA UNSPECIFIED  . Hypoxemia  . Pacemaker  . Hypercholesterolemia  . Hyperuricemia  . Benign hypertensive heart disease without heart failure  . Dysphagia  . Nonspecific (abnormal) findings on radiological and other examination of gastrointestinal tract  . Preventative health care  . Anxiety  . Depression  . Chronic pain  . Macular degeneration  . OSA (obstructive sleep apnea)  . Basal cell carcinoma of nose  . HTN (hypertension)  . Increased prostate specific antigen (PSA) velocity  . DM (diabetes mellitus)  . Gout attack  . Sinus node dysfunction  . Atrial fibrillation  . Melena    History  Smoking status  . Former Smoker -- 1.5 packs/day for 50 years  . Types: Cigarettes  . Quit date: 06/28/1996  Smokeless tobacco  . Current User  . Types: Chew    History  Alcohol Use No    Family History  Problem Relation Age of Onset  . Heart disease Father   . Lung cancer Mother   . Cancer Other     lung cancer  . Heart disease Other   . Hypertension Other     Review of Systems: Constitutional: no fever chills diaphoresis or fatigue or change in weight.  Head and neck: no hearing loss, no epistaxis, no photophobia or visual disturbance. Respiratory: No cough, shortness of breath or wheezing. Cardiovascular: No chest pain peripheral edema, palpitations. Gastrointestinal: No abdominal distention, no abdominal pain, no change in bowel habits hematochezia or melena. Genitourinary: No dysuria, no frequency, no urgency, no nocturia. Musculoskeletal:No arthralgias, no back pain, no gait disturbance or myalgias. Neurological: No dizziness, no headaches, no numbness, no seizures,  no syncope, no weakness, no tremors. Hematologic: No lymphadenopathy, no easy bruising. Psychiatric: No confusion, no hallucinations, no sleep disturbance.    Physical Exam: Filed Vitals:   01/16/12 0850  BP: 133/70  Pulse: 71  Resp: 18   the general appearance reveals a pleasant elderly gentleman who is in no distress.  Nasal oxygen is present.The head and neck exam reveals pupils equal and reactive.  Extraocular  movements are full.  There is no scleral icterus.  The mouth and pharynx are normal.  The neck is supple.  The carotids reveal no bruits.  The jugular venous pressure is normal.  The  thyroid is not enlarged.  There is no lymphadenopathy.  The chest is clear to percussion and auscultation.  There are no rales or rhonchi.  Expansion of the chest is symmetrical.  The breath sounds are distant bilaterally consistent with COPD.  The precordium is quiet.  The first heart sound is normal.  The second heart sound is physiologically split.  There is no murmur gallop rub or click.  There is no abnormal lift or heave.  The abdomen is soft and nontender.  The bowel sounds are normal.  The liver and spleen are not enlarged.  There are no abdominal masses.  There are no abdominal bruits.  Extremities reveal good pedal pulses.  There is no phlebitis or edema.  There is no cyanosis or clubbing.  Strength is normal and symmetrical in all extremities.  There is no lateralizing weakness.  There are no sensory deficits.  The skin is warm and dry.  There is no rash.     Assessment / Plan: The patient is doing well on current therapy including higher dose hydralazine.  Continue same medication and be rechecked in 4 months for followup office visit lipid panel hepatic function panel and basal metabolic panel

## 2012-01-16 NOTE — Patient Instructions (Addendum)
Your physician recommends that you continue on your current medications as directed. Please refer to the Current Medication list given to you today.  Your physician recommends that you schedule a follow-up appointment in: 4 months with fasting labs (lp/bmet/hfp)  

## 2012-01-16 NOTE — Progress Notes (Signed)
Subjective:    Patient ID: [name] Stevenson, male    DOB: 07-01-1932, 79 y.o.   MRN: [id number]  HPI 78 yo man, with severe COPD and chronic hypoxemia. He also has a history of non-small cell lung cancer, status post right upper lobectomy and adjuvant chemotherapy in 2006.   ROV 10/25/09 -- COPD and OSA. Tells me has been feeling well. Last Ct scan showed no evidence recurrance of his lung CA. Breathing has been OK, the heat has limited his activity. Last time we changed Advair to Symbicort - has helped his irritated throat, has also made his breathing a bit better. Never had to stop his Lisinopril. No new problems, no exacerbations.   ROV 05/04/10 -- Hx COPD and OSA, also hx NSCLCA and RUL lobectomy. Reports that he was treated for AE-COPD with abx and pred. Has been on Spiriva and Symbicort, cut back on the symbicort due to cost. Has been using once daily instead of two times a day. Had the pneumovax 7 yrs ago, shouldn't tneed it again. Having CT scans of the chest followed by Dr Welton Flakes, annually. Wears CPAP every night.   ROV 10/19/10 -- COPD, OSA, NSCLCA s/p RUL lobectomy. He had been getting CT scans annually, planning to see Dr Welton Flakes. May be having some decreased energy since February. He has been having difficulty with his swallowing - some dysphagia, food getting stuck. Sometimes causes him to get choked. Wears CPAP mask reliably.  Only using Symbicort daily, Spiriva.   03/20/2011 Pt ill for one week.  Rx levaquin x 5days 750/d. Not improved.  Severe dyspnea at rest. Not able to walk. No real chest pain.  Notes more wheezing.  Severe dyspnea even at rest.  No real fever T99.7  At home.  Did get flu vaccine in November. >>RX Avelox x 7 days , steroid taper.XR w/ no acute changes   Follow up  Pt returns for a 1 week follow up for COPD flare. Seen in office last week, tx with Avelox and steroid taper. CXR with no acute process last ov.  Feeling some better but still has a lot of coughing and congestion   Finished Avelox yesterday. Has few days left on prednisone.  No hemotpysis or chest pain .   ROV 04/18/11 -- Severe COPD, OSA, NSCLCA s/p RUL lobectomy. He was seen beginning of the month for an exacerbation following probable URI, CXR showed no infiltrates. He was treated w steroids and abx. He is improved, close to baseline. Using Spiriva and Symbicort.   ROV 06/05/11 -- Severe COPD, OSA, NSCLCA s/p RUL lobectomy. He still hasn't gotten back to prior baseline since URI and AE. He is on 3L/min pulsed. He does exert but w difficulty. He is on Spiriva + Symbicort.   ROV 08/29/11 -- Severe COPD, OSA, NSCLCA s/p RUL lobectomy. He has remained active, still limited w exertion. He is on Spiriva + Symbicort. No exacerbations or pred, no abx. No cough or wheeze. He is having more LE edema, has started on lasix. Wears his CPAP reliably  ROV 11/28/11 -- Severe COPD, OSA, NSCLCA s/p RUL lobectomy. He has been doing well, but probably less active this year. He and his wife have worried about his oxygen, the tanks. Once they ran out on a trip from [place] and they were panicked. He hasn't had any exacerbations, hospitalizations. Remains on Spiriva + Symbicort. He is having gout flare L great toe. He had EGD for anemia eval >> reassuring.   01/14/12  Acute  Productive cough, increased SOB, wheezing, tightness in chest onset today, began with head congestion w/ clear drainage x1 week -  Cough is getting worse .  otc cold meds not working No hemoptysis or chest pain  No edema.       Review of Systems Constitutional:   No  weight loss, night sweats,  Fevers, chills,  +fatigue, or  lassitude.  HEENT:   No headaches,  Difficulty swallowing,  Tooth/dental problems, or  Sore throat,                No sneezing, itching, ear ache,  +nasal congestion, post nasal drip,   CV:  No chest pain,  Orthopnea, PND, swelling in lower extremities, anasarca, dizziness, palpitations, syncope.   GI  No heartburn,  indigestion, abdominal pain, nausea, vomiting, diarrhea, change in bowel habits, loss of appetite, bloody stools.   Resp: ,  No coughing up of blood.  No change in color of mucus.  No wheezing.  No chest wall deformity  Skin: no rash or lesions.  GU: no dysuria, change in color of urine, no urgency or frequency.  No flank pain, no hematuria   MS:  No joint pain or swelling.  No decreased range of motion.  No back pain.  Psych:  No change in mood or affect. No depression or anxiety.  No memory loss.         Objective:   Physical Exam GEN: A/Ox3; pleasant , NAD  HEENT:  [place]/AT,  EACs-clear, TMs-wnl, NOSE-clear drainage, THROAT-clear, no lesions, no postnasal drip or exudate noted.   NECK:  Supple w/ fair ROM; no JVD; normal carotid impulses w/o bruits; no thyromegaly or nodules palpated; no lymphadenopathy.  RESP  Scattered rhonchi no accessory muscle use, no dullness to percussion  CARD:  RRR, no m/r/g  , no peripheral edema, pulses intact, no cyanosis or clubbing.  GI:   Soft & nt; nml bowel sounds; no organomegaly or masses detected.  Musco: Warm bil, no deformities or joint swelling noted.   Neuro: alert, no focal deficits noted.    Skin: Warm, no lesions or rashes         Assessment & Plan:

## 2012-01-16 NOTE — Assessment & Plan Note (Signed)
Flare   Plan Augmentin 875mg  Twice daily  For 7 days  Mucinex DM Twice daily  As needed   Prednisone taper over next week  Fluids and rest  Please contact office for sooner follow up if symptoms do not improve or worsen or seek emergency care  Follow up Dr. [name]  2 weeks and As needed

## 2012-01-16 NOTE — Assessment & Plan Note (Signed)
The patient has severe COPD and is on oxygen 24/7.  Recently developed a productive cough and was seen by pulmonary who added an antibiotic.  His wife thinks it was Augmentin.  The patient is clinically improved and his cough and dyspnea have improved.

## 2012-01-16 NOTE — Assessment & Plan Note (Signed)
The patient has a history of tachybradycardia syndrome.  He has a functioning pacemaker and has not been aware of any recent palpitations or tachycardia

## 2012-01-16 NOTE — Assessment & Plan Note (Signed)
The patient is now on hydralazine 50 mg twice a day instead of 25 mg twice a day.  He is tolerating the higher dose and his blood pressure has improved.

## 2012-01-17 ENCOUNTER — Other Ambulatory Visit: Payer: Self-pay | Admitting: *Deleted

## 2012-01-17 ENCOUNTER — Other Ambulatory Visit: Payer: Self-pay | Admitting: Medical Oncology

## 2012-01-17 ENCOUNTER — Other Ambulatory Visit: Payer: Self-pay | Admitting: Adult Health

## 2012-01-17 DIAGNOSIS — C3490 Malignant neoplasm of unspecified part of unspecified bronchus or lung: Secondary | ICD-10-CM

## 2012-01-17 NOTE — Progress Notes (Signed)
erroneous

## 2012-01-20 ENCOUNTER — Telehealth: Payer: Self-pay | Admitting: Oncology

## 2012-01-20 ENCOUNTER — Ambulatory Visit (HOSPITAL_BASED_OUTPATIENT_CLINIC_OR_DEPARTMENT_OTHER): Payer: Medicare Other | Admitting: Oncology

## 2012-01-20 ENCOUNTER — Other Ambulatory Visit (HOSPITAL_BASED_OUTPATIENT_CLINIC_OR_DEPARTMENT_OTHER): Payer: Medicare Other | Admitting: Lab

## 2012-01-20 ENCOUNTER — Encounter: Payer: Self-pay | Admitting: Oncology

## 2012-01-20 VITALS — BP 154/84 | HR 71 | Temp 98.7°F | Resp 20 | Ht 65.0 in | Wt 222.5 lb

## 2012-01-20 DIAGNOSIS — C3410 Malignant neoplasm of upper lobe, unspecified bronchus or lung: Secondary | ICD-10-CM

## 2012-01-20 DIAGNOSIS — R0602 Shortness of breath: Secondary | ICD-10-CM

## 2012-01-20 DIAGNOSIS — C3490 Malignant neoplasm of unspecified part of unspecified bronchus or lung: Secondary | ICD-10-CM

## 2012-01-20 LAB — CBC WITH DIFFERENTIAL/PLATELET
BASO%: 0.3 % (ref 0.0–2.0)
Basophils Absolute: 0.1 10*3/uL (ref 0.0–0.1)
EOS%: 0 % (ref 0.0–7.0)
HCT: 37.9 % — ABNORMAL LOW (ref 38.4–49.9)
HGB: 12.3 g/dL — ABNORMAL LOW (ref 13.0–17.1)
MCH: 30 pg (ref 27.2–33.4)
MCHC: 32.4 g/dL (ref 32.0–36.0)
MCV: 92.7 fL (ref 79.3–98.0)
MONO%: 4.7 % (ref 0.0–14.0)
NEUT%: 86.2 % — ABNORMAL HIGH (ref 39.0–75.0)
RDW: 15 % — ABNORMAL HIGH (ref 11.0–14.6)

## 2012-01-20 LAB — COMPREHENSIVE METABOLIC PANEL (CC13)
AST: 22 U/L (ref 5–34)
Alkaline Phosphatase: 96 U/L (ref 40–150)
BUN: 31 mg/dL — ABNORMAL HIGH (ref 7.0–26.0)
Creatinine: 1.9 mg/dL — ABNORMAL HIGH (ref 0.7–1.3)

## 2012-01-20 NOTE — Telephone Encounter (Signed)
gve the pt's wife the nov 2013 ct scan appt along with the dec 2013 appt calendar

## 2012-01-20 NOTE — Patient Instructions (Addendum)
Proceed with CT scans  I will see you back in 2 months

## 2012-01-29 ENCOUNTER — Emergency Department (HOSPITAL_COMMUNITY): Payer: Medicare Other

## 2012-01-29 ENCOUNTER — Ambulatory Visit: Payer: Medicare Other | Admitting: Emergency Medicine

## 2012-01-29 ENCOUNTER — Telehealth: Payer: Self-pay | Admitting: Emergency Medicine

## 2012-01-29 ENCOUNTER — Inpatient Hospital Stay (HOSPITAL_COMMUNITY)
Admission: EM | Admit: 2012-01-29 | Discharge: 2012-02-02 | DRG: 602 | Disposition: A | Discharge status: Home / Self Care | Payer: Medicare Other | Attending: Internal Medicine | Admitting: Internal Medicine

## 2012-01-29 ENCOUNTER — Encounter (HOSPITAL_COMMUNITY): Payer: Self-pay

## 2012-01-29 ENCOUNTER — Telehealth: Payer: Self-pay | Admitting: *Deleted

## 2012-01-29 DIAGNOSIS — F419 Anxiety disorder, unspecified: Secondary | ICD-10-CM

## 2012-01-29 DIAGNOSIS — M109 Gout, unspecified: Secondary | ICD-10-CM

## 2012-01-29 DIAGNOSIS — I5033 Acute on chronic diastolic (congestive) heart failure: Secondary | ICD-10-CM | POA: Diagnosis present

## 2012-01-29 DIAGNOSIS — IMO0002 Reserved for concepts with insufficient information to code with codable children: Secondary | ICD-10-CM | POA: Diagnosis present

## 2012-01-29 DIAGNOSIS — L02419 Cutaneous abscess of limb, unspecified: Principal | ICD-10-CM | POA: Diagnosis present

## 2012-01-29 DIAGNOSIS — G4733 Obstructive sleep apnea (adult) (pediatric): Secondary | ICD-10-CM | POA: Diagnosis present

## 2012-01-29 DIAGNOSIS — E119 Type 2 diabetes mellitus without complications: Secondary | ICD-10-CM

## 2012-01-29 DIAGNOSIS — J449 Chronic obstructive pulmonary disease, unspecified: Secondary | ICD-10-CM | POA: Diagnosis present

## 2012-01-29 DIAGNOSIS — I4891 Unspecified atrial fibrillation: Secondary | ICD-10-CM

## 2012-01-29 DIAGNOSIS — L0291 Cutaneous abscess, unspecified: Secondary | ICD-10-CM

## 2012-01-29 DIAGNOSIS — F1010 Alcohol abuse, uncomplicated: Secondary | ICD-10-CM

## 2012-01-29 DIAGNOSIS — F1020 Alcohol dependence, uncomplicated: Secondary | ICD-10-CM

## 2012-01-29 DIAGNOSIS — I10 Essential (primary) hypertension: Secondary | ICD-10-CM | POA: Diagnosis present

## 2012-01-29 DIAGNOSIS — L0390 Cellulitis, unspecified: Secondary | ICD-10-CM

## 2012-01-29 DIAGNOSIS — J4489 Other specified chronic obstructive pulmonary disease: Secondary | ICD-10-CM | POA: Diagnosis present

## 2012-01-29 DIAGNOSIS — E1165 Type 2 diabetes mellitus with hyperglycemia: Secondary | ICD-10-CM | POA: Diagnosis present

## 2012-01-29 DIAGNOSIS — E7800 Pure hypercholesterolemia, unspecified: Secondary | ICD-10-CM

## 2012-01-29 DIAGNOSIS — I5032 Chronic diastolic (congestive) heart failure: Secondary | ICD-10-CM

## 2012-01-29 DIAGNOSIS — I509 Heart failure, unspecified: Secondary | ICD-10-CM

## 2012-01-29 HISTORY — DX: Alcohol abuse, uncomplicated: F10.10

## 2012-01-29 LAB — BASIC METABOLIC PANEL
Calcium: 8.9 mg/dL (ref 8.4–10.5)
GFR calc Af Amer: 48 mL/min — ABNORMAL LOW (ref >90)
GFR calc non Af Amer: 41 mL/min — ABNORMAL LOW (ref >90)
Glucose, Bld: 116 mg/dL — ABNORMAL HIGH (ref 70–99)
Potassium: 4.3 mEq/L (ref 3.5–5.1)
Sodium: 137 mEq/L (ref 135–145)

## 2012-01-29 LAB — CBC WITH DIFFERENTIAL/PLATELET
Basophils Absolute: 0 10*3/uL (ref 0.0–0.1)
Basophils Relative: 0 % (ref 0–1)
Eosinophils Absolute: 0 10*3/uL (ref 0.0–0.7)
Eosinophils Relative: 0 % (ref 0–5)
MCH: 29.9 pg (ref 26.0–34.0)
MCHC: 33.3 g/dL (ref 30.0–36.0)
MCV: 89.6 fL (ref 78.0–100.0)
Neutrophils Relative %: 79 % — ABNORMAL HIGH (ref 43–77)
Platelets: 313 10*3/uL (ref 150–400)
RDW: 15.8 % — ABNORMAL HIGH (ref 11.5–15.5)

## 2012-01-29 LAB — PRO B NATRIURETIC PEPTIDE: Pro B Natriuretic peptide (BNP): 3379 pg/mL — ABNORMAL HIGH (ref 0–450)

## 2012-01-29 LAB — GLUCOSE, CAPILLARY: Glucose-Capillary: 125 mg/dL — ABNORMAL HIGH (ref 70–99)

## 2012-01-29 LAB — TROPONIN I: Troponin I: <0.3 ng/mL (ref <0.30)

## 2012-01-29 MED ORDER — VITAMIN E 45 MG (100 UNIT) PO CAPS
600.0000 [IU] | ORAL_CAPSULE | Freq: Two times a day (BID) | ORAL | Status: DC
  Administered 2012-01-29 – 2012-01-31 (×5): 600 [IU] via ORAL
  Administered 2012-02-01: 400 [IU] via ORAL
  Administered 2012-02-01: 600 [IU] via ORAL
  Administered 2012-02-01: 200 [IU] via ORAL
  Administered 2012-02-02: 600 [IU] via ORAL
  Filled 2012-01-29 (×10): qty 2

## 2012-01-29 MED ORDER — FUROSEMIDE 10 MG/ML IJ SOLN
60.0000 mg | Freq: Once | INTRAMUSCULAR | Status: AC
  Administered 2012-01-29: 60 mg via INTRAVENOUS
  Filled 2012-01-29: qty 8

## 2012-01-29 MED ORDER — ALPRAZOLAM 0.5 MG PO TABS
0.5000 mg | ORAL_TABLET | Freq: Three times a day (TID) | ORAL | Status: DC | PRN
  Administered 2012-01-29 – 2012-01-31 (×3): 0.5 mg via ORAL
  Filled 2012-01-29 (×4): qty 1

## 2012-01-29 MED ORDER — EYE VITAMINS PO TABS: 1.0000 | ORAL_TABLET | Freq: Two times a day (BID) | ORAL | Status: DC

## 2012-01-29 MED ORDER — BUDESONIDE-FORMOTEROL FUMARATE 160-4.5 MCG/ACT IN AERO
2.0000 | INHALATION_SPRAY | Freq: Two times a day (BID) | RESPIRATORY_TRACT | Status: DC
  Administered 2012-01-29 – 2012-02-02 (×8): 2 via RESPIRATORY_TRACT
  Filled 2012-01-29: qty 6

## 2012-01-29 MED ORDER — CITALOPRAM HYDROBROMIDE 10 MG PO TABS
10.0000 mg | ORAL_TABLET | Freq: Every day | ORAL | Status: DC
  Administered 2012-01-30 – 2012-02-02 (×4): 10 mg via ORAL
  Filled 2012-01-29 (×4): qty 1

## 2012-01-29 MED ORDER — LORAZEPAM 2 MG/ML IJ SOLN: 1.0000 mg | Freq: Four times a day (QID) | INTRAMUSCULAR | Status: AC | PRN

## 2012-01-29 MED ORDER — HYPROMELLOSE (GONIOSCOPIC) 2.5 % OP SOLN
2.0000 [drp] | OPHTHALMIC | Status: DC | PRN
  Filled 2012-01-29: qty 15

## 2012-01-29 MED ORDER — OCUVITE-LUTEIN PO CAPS
1.0000 | ORAL_CAPSULE | Freq: Two times a day (BID) | ORAL | Status: DC
  Filled 2012-01-29: qty 1

## 2012-01-29 MED ORDER — TEMAZEPAM 15 MG PO CAPS
30.0000 mg | ORAL_CAPSULE | Freq: Every day | ORAL | Status: DC
  Administered 2012-01-29 – 2012-02-01 (×4): 30 mg via ORAL
  Filled 2012-01-29 (×4): qty 2

## 2012-01-29 MED ORDER — FUROSEMIDE 80 MG PO TABS
80.0000 mg | ORAL_TABLET | Freq: Every day | ORAL | Status: DC
  Administered 2012-01-30: 80 mg via ORAL
  Filled 2012-01-29 (×2): qty 1

## 2012-01-29 MED ORDER — ALBUTEROL SULFATE HFA 108 (90 BASE) MCG/ACT IN AERS: 2.0000 | INHALATION_SPRAY | Freq: Four times a day (QID) | RESPIRATORY_TRACT | Status: DC | PRN

## 2012-01-29 MED ORDER — PANTOPRAZOLE SODIUM 40 MG PO TBEC
80.0000 mg | DELAYED_RELEASE_TABLET | Freq: Every day | ORAL | Status: DC
  Administered 2012-01-30 – 2012-02-02 (×4): 80 mg via ORAL
  Filled 2012-01-29 (×5): qty 2

## 2012-01-29 MED ORDER — FOLIC ACID 1 MG PO TABS
1.0000 mg | ORAL_TABLET | Freq: Every day | ORAL | Status: DC
  Administered 2012-01-30 – 2012-02-02 (×4): 1 mg via ORAL
  Filled 2012-01-29 (×4): qty 1

## 2012-01-29 MED ORDER — OMEGA-3-ACID ETHYL ESTERS 1 G PO CAPS
2.0000 g | ORAL_CAPSULE | Freq: Every day | ORAL | Status: DC
  Administered 2012-01-30 – 2012-02-02 (×4): 2 g via ORAL
  Filled 2012-01-29 (×4): qty 2

## 2012-01-29 MED ORDER — LORAZEPAM 1 MG PO TABS: 1.0000 mg | ORAL_TABLET | Freq: Four times a day (QID) | ORAL | Status: AC | PRN

## 2012-01-29 MED ORDER — ASPIRIN EC 81 MG PO TBEC
81.0000 mg | DELAYED_RELEASE_TABLET | Freq: Every day | ORAL | Status: DC
  Administered 2012-01-30 – 2012-02-02 (×4): 81 mg via ORAL
  Filled 2012-01-29 (×4): qty 1

## 2012-01-29 MED ORDER — METOPROLOL TARTRATE 50 MG PO TABS
50.0000 mg | ORAL_TABLET | Freq: Two times a day (BID) | ORAL | Status: DC
  Administered 2012-01-29 – 2012-02-02 (×8): 50 mg via ORAL
  Filled 2012-01-29 (×9): qty 1

## 2012-01-29 MED ORDER — SODIUM CHLORIDE 0.9 % IJ SOLN
3.0000 mL | Freq: Two times a day (BID) | INTRAMUSCULAR | Status: DC
  Administered 2012-01-30: 3 mL via INTRAVENOUS

## 2012-01-29 MED ORDER — FUROSEMIDE 40 MG PO TABS: 40.0000 mg | ORAL_TABLET | Freq: Two times a day (BID) | ORAL | Status: DC

## 2012-01-29 MED ORDER — PROSIGHT PO TABS
1.0000 | ORAL_TABLET | Freq: Two times a day (BID) | ORAL | Status: DC
  Administered 2012-01-29 – 2012-01-31 (×4): 1 via ORAL
  Administered 2012-01-31: 09:00:00 via ORAL
  Administered 2012-02-01 – 2012-02-02 (×3): 1 via ORAL
  Filled 2012-01-29 (×9): qty 1

## 2012-01-29 MED ORDER — INSULIN ASPART 100 UNIT/ML ~~LOC~~ SOLN
0.0000 [IU] | Freq: Three times a day (TID) | SUBCUTANEOUS | Status: DC
  Administered 2012-01-31: 2 [IU] via SUBCUTANEOUS
  Administered 2012-01-31: 3 [IU] via SUBCUTANEOUS
  Administered 2012-02-01: 2 [IU] via SUBCUTANEOUS
  Administered 2012-02-01: 5 [IU] via SUBCUTANEOUS

## 2012-01-29 MED ORDER — VANCOMYCIN HCL 1000 MG IV SOLR
1500.0000 mg | INTRAVENOUS | Status: DC
  Filled 2012-01-29: qty 1500

## 2012-01-29 MED ORDER — POLYVINYL ALCOHOL 1.4 % OP SOLN
2.0000 [drp] | OPHTHALMIC | Status: DC | PRN
  Filled 2012-01-29: qty 15

## 2012-01-29 MED ORDER — VANCOMYCIN HCL 1000 MG IV SOLR
750.0000 mg | Freq: Once | INTRAVENOUS | Status: AC
  Administered 2012-01-29: 750 mg via INTRAVENOUS
  Filled 2012-01-29: qty 750

## 2012-01-29 MED ORDER — VANCOMYCIN HCL 1000 MG IV SOLR
1500.0000 mg | INTRAVENOUS | Status: DC
  Administered 2012-01-30 – 2012-02-01 (×3): 1500 mg via INTRAVENOUS
  Filled 2012-01-29 (×4): qty 1500

## 2012-01-29 MED ORDER — VANCOMYCIN HCL IN DEXTROSE 1-5 GM/200ML-% IV SOLN
1000.0000 mg | Freq: Once | INTRAVENOUS | Status: AC
  Administered 2012-01-29: 1000 mg via INTRAVENOUS
  Filled 2012-01-29: qty 200

## 2012-01-29 MED ORDER — ATORVASTATIN CALCIUM 40 MG PO TABS
40.0000 mg | ORAL_TABLET | Freq: Every day | ORAL | Status: DC
  Administered 2012-01-29 – 2012-02-02 (×5): 40 mg via ORAL
  Filled 2012-01-29 (×5): qty 1

## 2012-01-29 MED ORDER — FUROSEMIDE 40 MG PO TABS
40.0000 mg | ORAL_TABLET | Freq: Every day | ORAL | Status: DC
  Administered 2012-01-29: 40 mg via ORAL
  Filled 2012-01-29 (×2): qty 1

## 2012-01-29 MED ORDER — SODIUM CHLORIDE 0.9 % IJ SOLN: 3.0000 mL | INTRAMUSCULAR | Status: DC | PRN

## 2012-01-29 MED ORDER — VITAMIN B-12 100 MCG PO TABS
50.0000 ug | ORAL_TABLET | Freq: Every day | ORAL | Status: DC
  Administered 2012-01-30 – 2012-02-02 (×4): 50 ug via ORAL
  Filled 2012-01-29 (×4): qty 1

## 2012-01-29 MED ORDER — SODIUM CHLORIDE 0.9 % IV SOLN
250.0000 mL | INTRAVENOUS | Status: DC | PRN
  Administered 2012-02-01: 250 mL via INTRAVENOUS

## 2012-01-29 MED ORDER — HYDRALAZINE HCL 25 MG PO TABS
25.0000 mg | ORAL_TABLET | Freq: Two times a day (BID) | ORAL | Status: DC
  Administered 2012-01-29 – 2012-02-02 (×8): 25 mg via ORAL
  Filled 2012-01-29 (×9): qty 1

## 2012-01-29 MED ORDER — MAGNESIUM OXIDE 400 (241.3 MG) MG PO TABS
400.0000 mg | ORAL_TABLET | Freq: Every day | ORAL | Status: DC
  Administered 2012-01-30 – 2012-02-02 (×4): 400 mg via ORAL
  Filled 2012-01-29 (×4): qty 1

## 2012-01-29 MED ORDER — DEXTROSE 5 % IV SOLN
1.0000 g | INTRAVENOUS | Status: DC
  Administered 2012-01-29 – 2012-02-01 (×4): 1 g via INTRAVENOUS
  Filled 2012-01-29 (×5): qty 10

## 2012-01-29 MED ORDER — OMEGA-3 FATTY ACIDS 1000 MG PO CAPS: 2.0000 g | ORAL_CAPSULE | Freq: Every day | ORAL | Status: DC

## 2012-01-29 MED ORDER — POTASSIUM CHLORIDE CRYS ER 20 MEQ PO TBCR
20.0000 meq | EXTENDED_RELEASE_TABLET | Freq: Every day | ORAL | Status: DC
  Administered 2012-01-29 – 2012-02-02 (×5): 20 meq via ORAL
  Filled 2012-01-29 (×5): qty 1

## 2012-01-29 MED ORDER — ADULT MULTIVITAMIN W/MINERALS CH
1.0000 | ORAL_TABLET | Freq: Every day | ORAL | Status: DC
  Administered 2012-01-30 – 2012-02-02 (×4): 1 via ORAL
  Filled 2012-01-29 (×4): qty 1

## 2012-01-29 MED ORDER — HEPARIN SODIUM (PORCINE) 5000 UNIT/ML IJ SOLN
5000.0000 [IU] | Freq: Three times a day (TID) | INTRAMUSCULAR | Status: DC
  Administered 2012-01-29 – 2012-02-02 (×11): 5000 [IU] via SUBCUTANEOUS
  Filled 2012-01-29 (×14): qty 1

## 2012-01-29 MED ORDER — TIOTROPIUM BROMIDE MONOHYDRATE 18 MCG IN CAPS
18.0000 ug | ORAL_CAPSULE | Freq: Every day | RESPIRATORY_TRACT | Status: DC
  Administered 2012-01-30 – 2012-02-02 (×4): 18 ug via RESPIRATORY_TRACT
  Filled 2012-01-29: qty 5

## 2012-01-29 MED ORDER — DILTIAZEM HCL ER BEADS 240 MG PO CP24
240.0000 mg | ORAL_CAPSULE | Freq: Every day | ORAL | Status: DC
  Administered 2012-01-30 – 2012-02-02 (×4): 240 mg via ORAL
  Filled 2012-01-29 (×4): qty 1

## 2012-01-29 MED ORDER — SODIUM CHLORIDE 0.9 % IJ SOLN: 3.0000 mL | Freq: Two times a day (BID) | INTRAMUSCULAR | Status: DC

## 2012-01-29 NOTE — ED Notes (Signed)
Report attempted- RN unavailable. Will call back in 10 minutes.

## 2012-01-29 NOTE — Progress Notes (Signed)
ANTIBIOTIC CONSULT NOTE - INITIAL  Pharmacy Consult for:  Vancomycin Indication:  Cellulitis of right lower extremity  No Known Allergies  Patient Measurements:    Vital Signs: Temp: 98.9 F (37.2 C) (11/13 1659) Temp src: Oral (11/13 1659) BP: 165/78 mmHg (11/13 1659) Pulse Rate: 63  (11/13 1659)  Labs:  Basename 01/29/12 1750  WBC 18.2*  HGB 12.7*  PLT 313  LABCREA --  CREATININE 1.56*   The CrCl is unknown because both a height and weight (above a minimum accepted value) are required for this calculation.  Medical History: Past Medical History  Diagnosis Date  . Stricture and stenosis of esophagus 12/07/2004    EGD done on 12/07/2004  . GERD (gastroesophageal reflux disease)   . Hypoxemia   . Increased prostate specific antigen (PSA) velocity 02/26/2011  . DM (diabetes mellitus) 02/26/2011  . Gout 02/26/2011  . COPD (chronic obstructive pulmonary disease)     oxygen at home, 3L 24/7  . OSA (obstructive sleep apnea) 02/26/2011    CPAP  . Atrial fibrillation   . Depression   . Cardiac arrest 2009    while in hospital   . Coronary artery disease     non obstructive cath 2006  . Hypertension   . HTN (hypertension) 02/26/2011  . Pacemaker   . Cardiac arrest 2009    while in hospital  . Cancer     lung s/p lobectomy x2 on RT lung  . Basal cell carcinoma of nose 02/26/2011  . Oxygen dependent   . ETOH abuse 01/29/2012    started drinking again - hx cardiac arrest in 2009 due to dt's with organ failure    Medications:  Scheduled:    . aspirin EC  81 mg Oral Daily  . atorvastatin  40 mg Oral Daily  . budesonide-formoterol  2 puff Inhalation BID  . cefTRIAXone (ROCEPHIN)  IV  1 g Intravenous Q24H  . citalopram  10 mg Oral Daily  . diltiazem  240 mg Oral Daily  . folic acid  1 mg Oral Daily  . [COMPLETED] furosemide  60 mg Intravenous Once  . furosemide  80 mg Oral Daily   And  . furosemide  40 mg Oral Daily  . heparin  5,000 Units Subcutaneous Q8H    . hydrALAZINE  25 mg Oral BID  . insulin aspart  0-15 Units Subcutaneous TID WC  . magnesium oxide  400 mg Oral Daily  . metoprolol  50 mg Oral BID  . multivitamin  1 tablet Oral BID  . multivitamin with minerals  1 tablet Oral Daily  . omega-3 acid ethyl esters  2 g Oral Daily  . pantoprazole  80 mg Oral Q1200  . potassium chloride SA  20 mEq Oral Daily  . sodium chloride  3 mL Intravenous Q12H  . sodium chloride  3 mL Intravenous Q12H  . temazepam  30 mg Oral QHS  . tiotropium  18 mcg Inhalation Daily  . vancomycin  1,500 mg Intravenous Q24H  . vancomycin  750 mg Intravenous Once  . [COMPLETED] vancomycin  1,000 mg Intravenous Once  . vitamin B-12  50 mcg Oral Daily  . vitamin E  600 Units Oral BID  . [DISCONTINUED] Eye Vitamins  1 tablet Oral BID  . [DISCONTINUED] fish oil-omega-3 fatty acids  2 g Oral Daily  . [DISCONTINUED] furosemide  40-80 mg Oral BID  . [DISCONTINUED] multivitamin-lutein  1 capsule Oral BID  . [DISCONTINUED] vancomycin  1,500 mg Intravenous Q24H  Assessment: Asked to assist with Vancomycin therapy for this 78 year-old male with cellulitis of the right lower extremity. A modified dose will be ordered due to renal insufficiency.  Goals of Therapy:   Vancomycin trough levels 10-15 mcg/ml  Eradication of infection  Plan:   Vancomycin 1000 mg was given in ED this evening.  Give an additional 750 mg dose x 1, then 1500 mg IV every 24 hours beginning on 11/14.  Vancomycin levels as needed to guide dosing.  Polo Riley R.Ph. 01/29/2012 10:39 PM

## 2012-01-29 NOTE — ED Notes (Addendum)
Pt from home with c/o right knee swelling and pain. Patient sts he has been on abx for a few weeks and that he recently went off abx and now his right knee has begun to throb and be painful. Bilateral lower edema noted- sts that he has not taken his lasix this afternoon.

## 2012-01-29 NOTE — ED Notes (Signed)
Report called to [facility] RN 4W

## 2012-01-29 NOTE — H&P (Signed)
[facility] History and Physical  [name] Stevenson JXB:[id number] DOB: 1932/08/23 DOA: 01/29/2012  Referring physician: ED PCP: [name], MD  Specialists: None  Chief Complaint: R knee cellulitis and SOB  HPI: [name] Stevenson is a 78 y.o. male who presents to the ED today after visiting his orthopaedic surgeon for ongoing cellulitis of his R knee onset about a week ago, characterized as erythema and swelling, improved with antibiotics but came back after completing a course of ABx at home, located on his R leg in the suprapatellar region associated with increased cough and congestion (has h/o COPD on 3L home O2 at baseline).  Orthopaedic surgeon attempted arthrocentesis of the knee joint due to concern for septic arthritis but was unable to aspirate any fluid, patient was sent to ED for cellulitis.  In ED patient found to have cellulitis, WBC 18k, and pleural effusions with a CHF picture (patient does report he has not taken his lasix this afternoon).  Hospitalist has been asked to admit.  Review of Systems: Patient denies fever, chills, weight loss (has been gaining weight these past 6 months), 12 systems reviewed and otherwise negative.  Past Medical History  Diagnosis Date  . Stricture and stenosis of esophagus 12/07/2004    EGD done on 12/07/2004  . GERD (gastroesophageal reflux disease)   . Hypoxemia   . Increased prostate specific antigen (PSA) velocity 02/26/2011  . DM (diabetes mellitus) 02/26/2011  . Gout 02/26/2011  . COPD (chronic obstructive pulmonary disease)     oxygen at home, 3L 24/7  . OSA (obstructive sleep apnea) 02/26/2011    CPAP  . Atrial fibrillation   . Depression   . Cardiac arrest 2009    while in hospital   . Coronary artery disease     non obstructive cath 2006  . Hypertension   . HTN (hypertension) 02/26/2011  . Pacemaker   . Cardiac arrest 2009    while in hospital  . Cancer     lung s/p lobectomy x2 on RT lung  . Basal cell carcinoma of  nose 02/26/2011  . Oxygen dependent   . ETOH abuse 01/29/2012    started drinking again - hx cardiac arrest in 2009 due to dt's with organ failure   Past Surgical History  Procedure Date  . Lung lobectomy     x2 lobes on RT lung  . Knee arthroscopy     bilateral  . Pace maker   . Tonsillectomy   . Hernia repair     x2, esophageal  . Cardiac catheterization   . Esophagogastroduodenoscopy 10/25/2011    Procedure: ESOPHAGOGASTRODUODENOSCOPY (EGD);  Surgeon: Hart Carwin, MD;  Location: [facility];  Service: Endoscopy;  Laterality: N/A;   Social History:  reports that he quit smoking about 15 years ago. His smoking use included Cigarettes. He has a 75 pack-year smoking history. His smokeless tobacco use includes Chew. He reports that he drinks alcohol. He reports that he does not use illicit drugs. Patient lives at home with wife, has just resumed drinking this year, last drink 01/28/12.  No Known Allergies  Family History  Problem Relation Age of Onset  . Heart disease Father   . Lung cancer Mother   . Cancer Other     lung cancer  . Heart disease Other   . Hypertension Other     Prior to Admission medications   Medication Sig Start Date End Date Taking? Authorizing Provider  albuterol (PROVENTIL HFA;VENTOLIN HFA) 108 (90 BASE) MCG/ACT  inhaler Inhale 2 puffs into the lungs every 6 (six) hours as needed for wheezing. 08/29/11 08/28/12 Yes Leslye Peer, MD  ALPRAZolam Prudy Feeler) 0.5 MG tablet Take 0.5 mg by mouth 3 (three) times daily as needed. For anxiety/sleeplessness. 09/04/11  Yes Corwin Levins, MD  atorvastatin (LIPITOR) 40 MG tablet Take 1 tablet (40 mg total) by mouth daily. 09/23/11  Yes Corwin Levins, MD  budesonide-formoterol [facility]) 160-4.5 MCG/ACT inhaler Inhale 2 puffs into the lungs 2 (two) times daily. 11/28/11 11/27/12 Yes Leslye Peer, MD  citalopram (CELEXA) 10 MG tablet Take 1 tablet (10 mg total) by mouth daily. 09/23/11 09/22/12 Yes Corwin Levins, MD  colchicine 0.6  MG tablet Take 0.6 mg by mouth daily as needed. For gout flares 09/23/11  Yes Corwin Levins, MD  diltiazem [facility]) 240 MG 24 hr capsule Take 1 capsule (240 mg total) by mouth daily. 09/23/11  Yes Corwin Levins, MD  esomeprazole (NEXIUM) 40 MG capsule Take 40 mg by mouth daily as needed. For acid reflux 09/23/11  Yes Corwin Levins, MD  fish oil-omega-3 fatty acids 1000 MG capsule Take 2 g by mouth daily.     Yes Historical Provider, MD  furosemide (LASIX) 40 MG tablet Take 40-80 mg by mouth 2 (two) times daily. Take two tabs (80mg ) in the am and one tab (40mg ) in the afternoon for one week, then back to 40 mg BID. 12/25/11  Yes Rosalio Macadamia, NP  furosemide (LASIX) 40 MG tablet Take 40-80 mg by mouth 2 (two) times daily. Take two tabs (80mg ) in the am and one tab (40mg ) in the afternoon 12/25/11  Yes Rosalio Macadamia, NP  hydrALAZINE (APRESOLINE) 25 MG tablet Take 25 mg by mouth 2 (two) times daily. 01/10/12  Yes Cassell Clement, MD  hydroxypropyl methylcellulose (ISOPTO TEARS) 2.5 % ophthalmic solution Place 2 drops into both eyes as needed. For dry/irritated eyes.   Yes Historical Provider, MD  Magnesium Oxide (DIASENSE MAGNESIUM) 400 (241.3 MG) MG TABS Take 1 tablet by mouth daily.    Yes Historical Provider, MD  metFORMIN (GLUCOPHAGE) 500 MG tablet Take 1 tablet (500 mg total) by mouth daily. 09/23/11  Yes Corwin Levins, MD  metoprolol (LOPRESSOR) 50 MG tablet Take 1 tablet (50 mg total) by mouth 2 (two) times daily. 10/02/11  Yes Corwin Levins, MD  Multiple Vitamins-Minerals (EYE VITAMINS) TABS Take 1 tablet by mouth 2 (two) times daily.     Yes Historical Provider, MD  multivitamin [facility]) per tablet Take 1 tablet by mouth daily.     Yes Historical Provider, MD  potassium chloride SA (K-DUR,KLOR-CON) 20 MEQ tablet Take 20 mEq by mouth daily.   Yes Historical Provider, MD  temazepam (RESTORIL) 30 MG capsule Take 30 mg by mouth at bedtime. 11/27/10 08/28/12 Yes Cassell Clement, MD  tiotropium (SPIRIVA) 18  MCG inhalation capsule Place 1 capsule (18 mcg total) into inhaler and inhale daily. 11/28/11 11/27/12 Yes Leslye Peer, MD  vitamin B-12 (CYANOCOBALAMIN) 100 MCG tablet Take 50 mcg by mouth daily.     Yes Historical Provider, MD  vitamin E 600 UNIT capsule Take 600 Units by mouth 2 (two) times daily.     Yes Historical Provider, MD  aspirin 81 MG tablet Take 81 mg by mouth daily.      Historical Provider, MD  predniSONE (DELTASONE) 10 MG tablet 4 tabs for 2 days, then 3 tabs for 2 days, 2 tabs for 2 days, then  1 tab for 2 days, then stop 01/14/12   Julio Sicks, NP   Physical Exam: Filed Vitals:   01/29/12 1659  BP: 165/78  Pulse: 63  Temp: 98.9 F (37.2 C)  TempSrc: Oral  Resp: 18  SpO2: 96%    General:  NAD, resting comfortably in bed Eyes: PEERLA EOMI ENT: mucous membranes moist Neck: supple w/o JVD Cardiovascular: RRR w/o MRG Respiratory: CTA B, not really able to appreciate any rhonchi in either lung, no wheezes Abdomen: soft, nt, nd, bs+ Skin: erythema within the outlined area on his R patella, bandaid over the R knee from arthrocentesis. Musculoskeletal: MAE, full ROM all 4 extremities Psychiatric: normal tone and affect Neurologic: AAOx3, grossly non-focal  Labs on Admission:  Basic Metabolic Panel:  Lab 01/29/12 1478  NA 137  K 4.3  CL 97  CO2 27  GLUCOSE 116*  BUN 21  CREATININE 1.56*  CALCIUM 8.9  MG --  PHOS --   Liver Function Tests: No results found for this basename: AST:5,ALT:5,ALKPHOS:5,BILITOT:5,PROT:5,ALBUMIN:5 in the last 168 hours No results found for this basename: LIPASE:5,AMYLASE:5 in the last 168 hours No results found for this basename: AMMONIA:5 in the last 168 hours CBC:  Lab 01/29/12 1750  WBC 18.2*  NEUTROABS 14.4*  HGB 12.7*  HCT 38.1*  MCV 89.6  PLT 313   Cardiac Enzymes:  Lab 01/29/12 1750  CKTOTAL --  CKMB --  CKMBINDEX --  TROPONINI <0.30    BNP (last 3 results)  Basename 01/29/12 1750 12/25/11 1124  PROBNP  3379.0* 78.0   CBG: No results found for this basename: GLUCAP:5 in the last 168 hours  Radiological Exams on Admission: Dg Chest 2 View  01/29/2012  *RADIOLOGY REPORT*  Clinical Data: Weakness and shortness of breath.  Chest pain.  CHEST - 2 VIEW  Comparison: 03/20/2011.  Findings: Cardiac enlargement is stable.  There is interval increase in a diffuse interstitial pattern compatible with edema. Bilateral pleural effusions are now present.  The left hemidiaphragm is markedly elevated.  Atherosclerotic calcifications are noted.  A left subclavian Port-A-Cath is stable.  IMPRESSION:  1.  Cardiomegaly with progressive interstitial edema and effusions compatible with congestive heart failure. 2.  Significant volume loss with elevation of the left hemidiaphragm.   Original Report Authenticated By: Marin Roberts, StevensonD.     EKG: Independently reviewed.  Assessment/Plan Principal Problem:  *Cellulitis Active Problems:  CHRONIC OBSTRUCTIVE PULMONARY DISEASE, SEVERE  Hypercholesterolemia  OSA (obstructive sleep apnea)  HTN (hypertension)  DM (diabetes mellitus)  Atrial fibrillation  EtOH dependence  Chronic diastolic CHF (congestive heart failure)   1. Cellulitis - putting patient on rocephin and vancomycin given failure of outpatient ABx therapy, no evidence of septic arthritis per attempted arthrocentisis by ortho, has leukocytosis but no other SIRS criteria at this time.  Blood Cx if he runs a fever. 2. Chronic diastolic CHF - probably at baseline but just didn't take lasix this evening, giving home lasix dose and monitor. 3. COPD - at baseline, no wheezing on exam or evidence of exacerbation, getting Abx anyhow for #1, no indication for steroids at this point (also just finished a course).  Continue home O2 and home CPAP at night for OSA. 4. EtOH dependence - concerning and need to monitor for DTz, in 2009 patient had DTz and went into cardiac arrest.  Quit drinking since then but  resumed again just recently.  No consultants called.  Code Status: Full Code (must indicate code status--if unknown or must be  presumed, indicate so) Family Communication: Spoke with patient, wife and son. (indicate person spoken with, if applicable, with phone number if by telephone) Disposition Plan: Admit to obs (indicate anticipated LOS)  Time spent: 70 min  [name] Stevenson, [name] M. [facility] Pager [phone]  If 7PM-7AM, please contact night-coverage [email or web address] Password [facility] 01/29/2012, 8:58 PM

## 2012-01-29 NOTE — Telephone Encounter (Signed)
Thank you :)

## 2012-01-29 NOTE — Telephone Encounter (Signed)
Spoke with [name] Stevenson at Dr Dellie Catholic are aware that a consult would need to be requested from the hospital side to have one of our MD's to see the patient. Stated she would have Dr Shelle Iron take care of this and nothing more needed. Will send as an FYI to RB and sign off on message.

## 2012-01-29 NOTE — ED Notes (Signed)
Dr. Shelle Iron updated this RN on pt status. Pt was recently started on Augmentin, steroid tx for bronchitis. Would like pt evaluated by [facility] for possible admission regarding respiratory issues. Dr. Shelle Iron also sent for for IV abx for knee bursitis that he believes is developing into a cellulitis. He attempted aspiration of knee without success in office.

## 2012-01-29 NOTE — Telephone Encounter (Signed)
Beth at Dr. Ermelinda Das offics: [phone]

## 2012-01-29 NOTE — Telephone Encounter (Signed)
Beth from Dr. Cecile Hearing office called and left message at 415pm that patient was still at their office.  ATC Beth back at office to see status of patient and their progress, however was left on hold.  ATC patient at home number listed in chart no answer, so [email or web address].

## 2012-01-29 NOTE — ED Provider Notes (Signed)
History     CSN: [id number]  Arrival date & time 01/29/12  1646   First MD Initiated Contact with Patient 01/29/12 1757      Chief Complaint  Patient presents with  . Recurrent Skin Infections    (Consider location/radiation/quality/duration/timing/severity/associated sxs/prior treatment) The history is provided by the patient and the spouse.   Patient here with right lower extremity swelling and erythema localized to the suprapatellar region of his right leg. No fever or chills. Was seen by his orthopedist today and sent here for evaluation of cellulitis. No vomiting or diarrhea. Also notes increased cough and congestion. Does have a history of COPD. Past Medical History  Diagnosis Date  . Stricture and stenosis of esophagus 12/07/2004    EGD done on 12/07/2004  . GERD (gastroesophageal reflux disease)   . Hypoxemia   . Increased prostate specific antigen (PSA) velocity 02/26/2011  . DM (diabetes mellitus) 02/26/2011  . Gout 02/26/2011  . COPD (chronic obstructive pulmonary disease)     oxygen at home, 3L 24/7  . OSA (obstructive sleep apnea) 02/26/2011    CPAP  . Atrial fibrillation   . Depression   . Cardiac arrest 2009    while in hospital   . Coronary artery disease     non obstructive cath 2006  . Hypertension   . HTN (hypertension) 02/26/2011  . Pacemaker   . Cardiac arrest 2009    while in hospital  . Cancer     lung s/p lobectomy x2 on RT lung  . Basal cell carcinoma of nose 02/26/2011  . Oxygen dependent     Past Surgical History  Procedure Date  . Lung lobectomy     x2 lobes on RT lung  . Knee arthroscopy     bilateral  . Pace maker   . Tonsillectomy   . Hernia repair     x2, esophageal  . Cardiac catheterization   . Esophagogastroduodenoscopy 10/25/2011    Procedure: ESOPHAGOGASTRODUODENOSCOPY (EGD);  Surgeon: Hart Carwin, MD;  Location: [facility];  Service: Endoscopy;  Laterality: N/A;    Family History  Problem Relation Age of Onset    . Heart disease Father   . Lung cancer Mother   . Cancer Other     lung cancer  . Heart disease Other   . Hypertension Other     History  Substance Use Topics  . Smoking status: Former Smoker -- 1.5 packs/day for 50 years    Types: Cigarettes    Quit date: 06/28/1996  . Smokeless tobacco: Current User    Types: Chew  . Alcohol Use: No      Review of Systems  All other systems reviewed and are negative.    Allergies  Review of patient's allergies indicates no known allergies.  Home Medications   Current Outpatient Rx  Name  Route  Sig  Dispense  Refill  . ALBUTEROL SULFATE HFA 108 (90 BASE) MCG/ACT IN AERS   Inhalation   Inhale 2 puffs into the lungs every 6 (six) hours as needed for wheezing.   1 Inhaler   6   . ALPRAZOLAM 0.5 MG PO TABS   Oral   Take 0.5 mg by mouth 3 (three) times daily as needed. For anxiety/sleeplessness.         . ATORVASTATIN CALCIUM 40 MG PO TABS   Oral   Take 1 tablet (40 mg total) by mouth daily.   90 tablet   3   . BUDESONIDE-FORMOTEROL FUMARATE 160-4.5  MCG/ACT IN AERO   Inhalation   Inhale 2 puffs into the lungs 2 (two) times daily.   1 Inhaler   0   . CITALOPRAM HYDROBROMIDE 10 MG PO TABS   Oral   Take 1 tablet (10 mg total) by mouth daily.   90 tablet   3   . COLCHICINE 0.6 MG PO TABS   Oral   Take 0.6 mg by mouth daily as needed. For gout flares         . DILTIAZEM HCL ER BEADS 240 MG PO CP24   Oral   Take 1 capsule (240 mg total) by mouth daily.   90 capsule   3   . ESOMEPRAZOLE MAGNESIUM 40 MG PO CPDR   Oral   Take 40 mg by mouth daily as needed. For acid reflux         . OMEGA-3 FATTY ACIDS 1000 MG PO CAPS   Oral   Take 2 g by mouth daily.           . FUROSEMIDE 40 MG PO TABS   Oral   Take 40-80 mg by mouth 2 (two) times daily. Take two tabs (80mg ) in the am and one tab (40mg ) in the afternoon for one week, then back to 40 mg BID.         Marland Kitchen FUROSEMIDE 40 MG PO TABS   Oral   Take 40-80 mg  by mouth 2 (two) times daily. Take two tabs (80mg ) in the am and one tab (40mg ) in the afternoon         . HYDRALAZINE HCL 25 MG PO TABS   Oral   Take 25 mg by mouth 2 (two) times daily.         Marland Kitchen HYPROMELLOSE 2.5 % OP SOLN   Both Eyes   Place 2 drops into both eyes as needed. For dry/irritated eyes.         Marland Kitchen MAGNESIUM OXIDE 400 (241.3 MG) MG PO TABS   Oral   Take 1 tablet by mouth daily.          Marland Kitchen METFORMIN HCL 500 MG PO TABS   Oral   Take 1 tablet (500 mg total) by mouth daily.   90 tablet   3   . METOPROLOL TARTRATE 50 MG PO TABS   Oral   Take 1 tablet (50 mg total) by mouth 2 (two) times daily.   180 tablet   1   . EYE VITAMINS PO TABS   Oral   Take 1 tablet by mouth 2 (two) times daily.           . MULTIVITAMINS PO TABS   Oral   Take 1 tablet by mouth daily.           Marland Kitchen POTASSIUM CHLORIDE CRYS ER 20 MEQ PO TBCR   Oral   Take 20 mEq by mouth daily.         Marland Kitchen TEMAZEPAM 30 MG PO CAPS   Oral   Take 30 mg by mouth at bedtime.         Marland Kitchen TIOTROPIUM BROMIDE MONOHYDRATE 18 MCG IN CAPS   Inhalation   Place 1 capsule (18 mcg total) into inhaler and inhale daily.   30 capsule   0   . VITAMIN B-12 100 MCG PO TABS   Oral   Take 50 mcg by mouth daily.           Marland Kitchen VITAMIN E 600 UNITS PO  CAPS   Oral   Take 600 Units by mouth 2 (two) times daily.           . ASPIRIN 81 MG PO TABS   Oral   Take 81 mg by mouth daily.           Marland Kitchen PREDNISONE 10 MG PO TABS      4 tabs for 2 days, then 3 tabs for 2 days, 2 tabs for 2 days, then 1 tab for 2 days, then stop   20 tablet   0     BP 165/78  Pulse 63  Temp 98.9 F (37.2 C) (Oral)  Resp 18  SpO2 96%  Physical Exam  Nursing note and vitals reviewed. Constitutional: He is oriented to person, place, and time. He appears well-developed and well-nourished.  Non-toxic appearance. No distress.  HENT:  Head: Normocephalic and atraumatic.  Eyes: Conjunctivae normal, EOM and lids are normal. Pupils  are equal, round, and reactive to light.  Neck: Normal range of motion. Neck supple. No tracheal deviation present. No mass present.  Cardiovascular: Normal rate, regular rhythm and normal heart sounds.  Exam reveals no gallop.   No murmur heard. Pulmonary/Chest: Effort normal. No stridor. No respiratory distress. He has decreased breath sounds. He has no wheezes. He has no rhonchi. He has no rales.  Abdominal: Soft. Normal appearance and bowel sounds are normal. He exhibits no distension. There is no tenderness. There is no rebound and no CVA tenderness.  Musculoskeletal: Normal range of motion. He exhibits no edema and no tenderness.       Right suprapatellar erythema without crepitus--no thigh erythema, neurovasc intact  Neurological: He is alert and oriented to person, place, and time. He has normal strength. No cranial nerve deficit or sensory deficit. GCS eye subscore is 4. GCS verbal subscore is 5. GCS motor subscore is 6.  Skin: Skin is warm and dry. No abrasion and no rash noted.  Psychiatric: He has a normal mood and affect. His speech is normal and behavior is normal.    ED Course  Procedures (including critical care time)  Labs Reviewed - No data to display No results found.   No diagnosis found.    MDM  Patient given vancomycin for his prostate last. BNP was elevated and chest x-ray consistent with CHF. Troponin negative. EKG without ischemic changes. Patient given Lasix and will be admitted by triad  CRITICAL CARE Performed by: Toy Baker   Total critical care time: 45  Critical care time was exclusive of separately billable procedures and treating other patients.  Critical care was necessary to treat or prevent imminent or life-threatening deterioration.  Critical care was time spent personally by me on the following activities: development of treatment plan with patient and/or surrogate as well as nursing, discussions with consultants, evaluation of patient's  response to treatment, examination of patient, obtaining history from patient or surrogate, ordering and performing treatments and interventions, ordering and review of laboratory studies, ordering and review of radiographic studies, pulse oximetry and re-evaluation of patient's condition.         Toy Baker, MD 01/29/12 [phone]

## 2012-01-29 NOTE — ED Notes (Signed)
Pt presents with NAD- Seen by Dr. Jillyn Hidden requesting evaluation for cellulitis to RLE

## 2012-01-30 ENCOUNTER — Ambulatory Visit: Payer: Medicare Other | Admitting: Emergency Medicine

## 2012-01-30 DIAGNOSIS — M109 Gout, unspecified: Secondary | ICD-10-CM

## 2012-01-30 LAB — COMPREHENSIVE METABOLIC PANEL
Albumin: 2.5 g/dL — ABNORMAL LOW (ref 3.5–5.2)
Alkaline Phosphatase: 88 U/L (ref 39–117)
BUN: 22 mg/dL (ref 6–23)
Calcium: 8.6 mg/dL (ref 8.4–10.5)
Creatinine, Ser: 1.69 mg/dL — ABNORMAL HIGH (ref 0.50–1.35)
GFR calc Af Amer: 43 mL/min — ABNORMAL LOW (ref >90)
Glucose, Bld: 123 mg/dL — ABNORMAL HIGH (ref 70–99)
Potassium: 3.7 mEq/L (ref 3.5–5.1)
Total Protein: 5.8 g/dL — ABNORMAL LOW (ref 6.0–8.3)

## 2012-01-30 LAB — CBC
HCT: 34.5 % — ABNORMAL LOW (ref 39.0–52.0)
Hemoglobin: 11.5 g/dL — ABNORMAL LOW (ref 13.0–17.0)
MCH: 29.8 pg (ref 26.0–34.0)
MCHC: 33.3 g/dL (ref 30.0–36.0)
MCV: 89.4 fL (ref 78.0–100.0)
RDW: 15.7 % — ABNORMAL HIGH (ref 11.5–15.5)

## 2012-01-30 LAB — GLUCOSE, CAPILLARY: Glucose-Capillary: 112 mg/dL — ABNORMAL HIGH (ref 70–99)

## 2012-01-30 LAB — HEMOGLOBIN A1C
Hgb A1c MFr Bld: 6.1 % — ABNORMAL HIGH (ref <5.7)
Mean Plasma Glucose: 128 mg/dL — ABNORMAL HIGH (ref <117)

## 2012-01-30 LAB — MAGNESIUM: Magnesium: 1.9 mg/dL (ref 1.5–2.5)

## 2012-01-30 MED ORDER — SODIUM CHLORIDE 0.9 % IJ SOLN: 10.0000 mL | Freq: Two times a day (BID) | INTRAMUSCULAR | Status: DC

## 2012-01-30 MED ORDER — SODIUM CHLORIDE 0.9 % IJ SOLN
10.0000 mL | INTRAMUSCULAR | Status: DC | PRN
  Administered 2012-01-30 – 2012-02-02 (×4): 10 mL

## 2012-01-30 MED ORDER — PREDNISONE 20 MG PO TABS
40.0000 mg | ORAL_TABLET | Freq: Every day | ORAL | Status: AC
  Administered 2012-01-30 – 2012-02-01 (×3): 40 mg via ORAL
  Filled 2012-01-30 (×3): qty 2

## 2012-01-30 MED ORDER — FUROSEMIDE 10 MG/ML IJ SOLN
40.0000 mg | Freq: Two times a day (BID) | INTRAMUSCULAR | Status: DC
  Administered 2012-01-31: 40 mg via INTRAVENOUS
  Filled 2012-01-30 (×3): qty 4

## 2012-01-30 MED ORDER — FUROSEMIDE 10 MG/ML IJ SOLN
40.0000 mg | Freq: Once | INTRAMUSCULAR | Status: AC
  Administered 2012-01-30: 40 mg via INTRAVENOUS
  Filled 2012-01-30: qty 4

## 2012-01-30 NOTE — Progress Notes (Signed)
[facility] PROGRESS NOTE  [name] Stevenson NFA:[id number] DOB: February 10, 1933 DOA: 01/29/2012 PCP: [name], MD  Assessment/Plan: 1. Cellulitis - putting patient on rocephin and vancomycin given failure of outpatient ABx therapy, no evidence of septic arthritis per attempted arthrocentisis by ortho, has leukocytosis but no other SIRS criteria at this time. Blood Cx if he runs a fever. 2. Acute on Chronic diastolic heart failure: his pro bnp is elevated. We will start him on IV lasix. And recheck pro bnp in am. Repeat cxr in 2 days to evaluate for improvement of the pulmonary edema. Echo in 03/13 showed good LVEF, and grade 1 diastolic dysfunction.  3. COPD - at baseline, no wheezing on exam or evidence of exacerbation, getting Abx anyhow for #1, no indication for steroids for COPD, (also just finished a course). Continue home O2 and home CPAP at night for OSA. 4. EtOH dependence - concerning and need to monitor for DTz, in 2009 patient had DTz and went into cardiac arrest. Quit drinking since then but resumed again just recently. 5. ? Gouty flare: patient is on lasix and this could be gouty flare, we will start her on steroids, with prednisone 40 mg daily for 3 days with quick taper.  6. Diabetes Mellitus: diet controlled. hgba1c is 6.1. On SSI. 7. Hypertension; controlled.  8. Atrial fibrillation: rate controlled. On cardizem.  9. DVT prophylaxis: sq heparin.      Code Status: full code Family Communication: family at bed side Disposition Plan: possibly in a day or two.    Consultants:  none  Antibiotics:  Vancomycin and rocephin day 2.  HPI/Subjective: Comfortable.  Objective: Filed Vitals:   01/29/12 2306 01/30/12 0450 01/30/12 1131 01/30/12 1335  BP:  139/77  129/67  Pulse: 87 75  77  Temp:  98.2 F (36.8 C)  98.7 F (37.1 C)  TempSrc:  Oral  Oral  Resp: 20 20  17   Height:      Weight:      SpO2: 98% 97% 97% 98%    Intake/Output Summary (Last 24 hours) at  01/30/12 1625 Last data filed at 01/30/12 1100  Gross per 24 hour  Intake    240 ml  Output   1400 ml  Net  -1160 ml   Filed Weights   01/29/12 2243  Weight: 99.2 kg (218 lb 11.1 oz)    Exam:  General: NAD, resting comfortably in bed Cardiovascular: RRR w/o MRG  Respiratory: CTA B, not really able to appreciate any rhonchi in either lung, no wheezes  Abdomen: soft, nt, nd, bs+  Skin: erythema within the outlined area on his R patella, bandaid over the R knee from arthrocentesis.  Musculoskeletal: MAE, full ROM all 4 extremities, pedal edema present.    Data Reviewed: Basic Metabolic Panel:  Lab 01/30/12 4696 01/29/12 1750  NA 138 137  K 3.7 4.3  CL 98 97  CO2 29 27  GLUCOSE 123* 116*  BUN 22 21  CREATININE 1.69* 1.56*  CALCIUM 8.6 8.9  MG 1.9 --  PHOS -- --   Liver Function Tests:  Lab 01/30/12 0420  AST 19  ALT 19  ALKPHOS 88  BILITOT 0.3  PROT 5.8*  ALBUMIN 2.5*   No results found for this basename: LIPASE:5,AMYLASE:5 in the last 168 hours No results found for this basename: AMMONIA:5 in the last 168 hours CBC:  Lab 01/30/12 0420 01/29/12 1750  WBC 13.8* 18.2*  NEUTROABS -- 14.4*  HGB 11.5* 12.7*  HCT 34.5* 38.1*  MCV 89.4 89.6  PLT 324 313   Cardiac Enzymes:  Lab 01/29/12 1750  CKTOTAL --  CKMB --  CKMBINDEX --  TROPONINI <0.30   BNP (last 3 results)  Basename 01/29/12 1750 12/25/11 1124  PROBNP 3379.0* 78.0   CBG:  Lab 01/30/12 1213 01/30/12 0802 01/29/12 2343  GLUCAP 112* 108* 125*    No results found for this or any previous visit (from the past 240 hour(s)).   Studies: Dg Chest 2 View  01/29/2012  *RADIOLOGY REPORT*  Clinical Data: Weakness and shortness of breath.  Chest pain.  CHEST - 2 VIEW  Comparison: 03/20/2011.  Findings: Cardiac enlargement is stable.  There is interval increase in a diffuse interstitial pattern compatible with edema. Bilateral pleural effusions are now present.  The left hemidiaphragm is markedly  elevated.  Atherosclerotic calcifications are noted.  A left subclavian Port-A-Cath is stable.  IMPRESSION:  1.  Cardiomegaly with progressive interstitial edema and effusions compatible with congestive heart failure. 2.  Significant volume loss with elevation of the left hemidiaphragm.   Original Report Authenticated By: Marin Roberts, [name]     Scheduled Meds:   . aspirin EC  81 mg Oral Daily  . atorvastatin  40 mg Oral Daily  . budesonide-formoterol  2 puff Inhalation BID  . cefTRIAXone (ROCEPHIN)  IV  1 g Intravenous Q24H  . citalopram  10 mg Oral Daily  . diltiazem  240 mg Oral Daily  . folic acid  1 mg Oral Daily  . furosemide  40 mg Intravenous Once  . [COMPLETED] furosemide  60 mg Intravenous Once  . heparin  5,000 Units Subcutaneous Q8H  . hydrALAZINE  25 mg Oral BID  . insulin aspart  0-15 Units Subcutaneous TID WC  . magnesium oxide  400 mg Oral Daily  . metoprolol  50 mg Oral BID  . multivitamin  1 tablet Oral BID  . multivitamin with minerals  1 tablet Oral Daily  . omega-3 acid ethyl esters  2 g Oral Daily  . pantoprazole  80 mg Oral Q1200  . potassium chloride SA  20 mEq Oral Daily  . predniSONE  40 mg Oral QAC breakfast  . sodium chloride  3 mL Intravenous Q12H  . sodium chloride  3 mL Intravenous Q12H  . temazepam  30 mg Oral QHS  . tiotropium  18 mcg Inhalation Daily  . vancomycin  1,500 mg Intravenous Q24H  . [COMPLETED] vancomycin  750 mg Intravenous Once  . [COMPLETED] vancomycin  1,000 mg Intravenous Once  . vitamin B-12  50 mcg Oral Daily  . vitamin E  600 Units Oral BID  . [DISCONTINUED] Eye Vitamins  1 tablet Oral BID  . [DISCONTINUED] fish oil-omega-3 fatty acids  2 g Oral Daily  . [DISCONTINUED] furosemide  40 mg Oral Daily  . [DISCONTINUED] furosemide  40-80 mg Oral BID  . [DISCONTINUED] furosemide  80 mg Oral Daily  . [DISCONTINUED] multivitamin-lutein  1 capsule Oral BID  . [DISCONTINUED] vancomycin  1,500 mg Intravenous Q24H   Continuous  Infusions:   Principal Problem:  *Cellulitis Active Problems:  CHRONIC OBSTRUCTIVE PULMONARY DISEASE, SEVERE  Hypercholesterolemia  OSA (obstructive sleep apnea)  HTN (hypertension)  DM (diabetes mellitus)  Atrial fibrillation  EtOH dependence  Chronic diastolic CHF (congestive heart failure)       Trew Sunde  [facility] Pager [phone]. If 8PM-8AM, please contact night-coverage at [email or web address], password [facility] 01/30/2012, 4:25 PM  LOS: 1 day

## 2012-01-30 NOTE — Progress Notes (Signed)
Clinical Social Work Department BRIEF PSYCHOSOCIAL ASSESSMENT 01/30/2012  Patient:  VASCO, CHONG     Account Number:  [id number]     Admit date:  01/29/2012  Clinical Social Worker:  Orpah Greek  Date/Time:  01/30/2012 11:42 AM  Referred by:  Physician  Date Referred:  01/30/2012 Referred for  Advanced Directives   Other Referral:   Interview type:  Patient Other interview type:   and family    PSYCHOSOCIAL DATA Living Status:  FAMILY Admitted from facility:   Level of care:   Primary support name:  Deonta Bomberger (son) h#: [id number] c#: [phone] Primary support relationship to patient:  CHILD, ADULT Degree of support available:   good    CURRENT CONCERNS Current Concerns  Other - See comment   Other Concerns:   Advance Directives    SOCIAL WORK ASSESSMENT / PLAN CSW spoke with patient & wife at bedside re: advance directives.   Assessment/plan status:  Information/Referral to [facility] Other assessment/ plan:   Information/referral to community resources:   CSW provided patient and wife with advance directive packets and assisted patient and wife in completion.    PATIENT'S/FAMILY'S RESPONSE TO PLAN OF CARE: Patient & wife were very appreciative of CSW assistance with advance directives. Copy of advance directive placed on shadow chart.        Unice Bailey, LCSW [facility] Clinical Social Worker cell #: [phone]

## 2012-01-30 NOTE — Progress Notes (Signed)
   CARE MANAGEMENT NOTE 01/30/2012  Patient:  [name] Stevenson, [name] Stevenson   Account Number:  [id number]  Date Initiated:  01/30/2012  Documentation initiated by:  Jiles Crocker  Subjective/Objective Assessment:   ADMITTED WITH CELLULITIS     Action/Plan:   PCP IS DR [name]  LIVES AT HOME WITH SPOUSE; POSSIBLE NEED HHC AT DISCHARGE; AWAITING FOR PT/OT EVALS   Anticipated DC Date:  02/06/2012   Anticipated DC Plan:  HOME W HOME HEALTH SERVICES      DC Planning Services  CM consult            Status of service:  In process, will continue to follow Medicare Important Message given?  NA - LOS <3 / Initial given by admissions (If response is "NO", the following Medicare IM given date fields will be blank)  Per UR Regulation:  Reviewed for med. necessity/level of care/duration of stay  Comments:  01/30/2012- B Delila Kuklinski RN,BSN,MHA

## 2012-01-31 ENCOUNTER — Telehealth: Payer: Self-pay | Admitting: Cardiology

## 2012-01-31 ENCOUNTER — Inpatient Hospital Stay (HOSPITAL_COMMUNITY): Payer: Medicare Other

## 2012-01-31 DIAGNOSIS — I4891 Unspecified atrial fibrillation: Secondary | ICD-10-CM

## 2012-01-31 DIAGNOSIS — F411 Generalized anxiety disorder: Secondary | ICD-10-CM

## 2012-01-31 LAB — CBC
HCT: 34.9 % — ABNORMAL LOW (ref 39.0–52.0)
MCH: 29.2 pg (ref 26.0–34.0)
MCHC: 32.7 g/dL (ref 30.0–36.0)
MCV: 89.5 fL (ref 78.0–100.0)
RDW: 15.8 % — ABNORMAL HIGH (ref 11.5–15.5)

## 2012-01-31 LAB — VANCOMYCIN, TROUGH: Vancomycin Tr: 14.7 ug/mL (ref 10.0–20.0)

## 2012-01-31 LAB — BASIC METABOLIC PANEL
BUN: 26 mg/dL — ABNORMAL HIGH (ref 6–23)
Calcium: 8.5 mg/dL (ref 8.4–10.5)
Creatinine, Ser: 1.88 mg/dL — ABNORMAL HIGH (ref 0.50–1.35)
GFR calc Af Amer: 38 mL/min — ABNORMAL LOW (ref >90)
GFR calc non Af Amer: 33 mL/min — ABNORMAL LOW (ref >90)

## 2012-01-31 LAB — GLUCOSE, CAPILLARY
Glucose-Capillary: 117 mg/dL — ABNORMAL HIGH (ref 70–99)
Glucose-Capillary: 256 mg/dL — ABNORMAL HIGH (ref 70–99)

## 2012-01-31 MED ORDER — HYDROCODONE-ACETAMINOPHEN 5-325 MG PO TABS
1.0000 | ORAL_TABLET | ORAL | Status: DC | PRN
  Administered 2012-01-31 (×2): 1 via ORAL
  Administered 2012-02-01 (×2): 2 via ORAL
  Filled 2012-01-31: qty 1
  Filled 2012-01-31 (×3): qty 2

## 2012-01-31 MED ORDER — FUROSEMIDE 40 MG PO TABS
40.0000 mg | ORAL_TABLET | Freq: Two times a day (BID) | ORAL | Status: DC
  Administered 2012-01-31 – 2012-02-02 (×4): 40 mg via ORAL
  Filled 2012-01-31 (×6): qty 1

## 2012-01-31 MED ORDER — SENNOSIDES-DOCUSATE SODIUM 8.6-50 MG PO TABS
2.0000 | ORAL_TABLET | Freq: Every evening | ORAL | Status: DC | PRN
  Administered 2012-01-31: 2 via ORAL
  Filled 2012-01-31 (×2): qty 2

## 2012-01-31 NOTE — Telephone Encounter (Signed)
Will forward to  Dr. Brackbill  

## 2012-01-31 NOTE — Telephone Encounter (Signed)
FYI: pt wife called to report pt is in the hospital with cellulitis. She can be reached at hospital room # [phone]

## 2012-01-31 NOTE — Evaluation (Signed)
Physical Therapy Evaluation Patient Details Name: [name] Stevenson MRN: [id number] DOB: 1932/08/23 Today's Date: 01/31/2012 Time: 4098-1191 PT Time Calculation (min): 27 min  PT Assessment / Plan / Recommendation Clinical Impression  Pt presents with cellulitis in R knee s/p arthrocentesis also with history of diastolic HF, pulmonary edema, and afib.  Tolerated OOB and ambulation in hallway on 3L O2 with O2 sats at 95% prior to ambulation, 90% during ambulation and 86% following ambulation.  Pt quickly recovered to 92% O2 saturation following ambulation with cues for pursed lip breathing.  Pt states 7/10 RPE following ambulation with noted SOB.  Pt will benefit from skilled PT in acute venue to address deficits.  PT recommends HHPT for follow up at D/C to maximize pts safety and functional independence.     PT Assessment  Patient needs continued PT services    Follow Up Recommendations  Home health PT    Does the patient have the potential to tolerate intense rehabilitation      Barriers to Discharge None      Equipment Recommendations  Rolling walker with 5" wheels (Pt/wife could not remember if RW or standard walker. )    Recommendations for Other Services     Frequency Min 3X/week    Precautions / Restrictions Precautions Precautions: Fall Precaution Comments: O2 dependent Restrictions Weight Bearing Restrictions: No   Pertinent Vitals/Pain No pain, monitor O2 sats, ambulated on 3L O2 with O2 saturation dropping to 86%.       Mobility  Bed Mobility Bed Mobility: Supine to Sit Supine to Sit: 5: Supervision;HOB elevated;With rails Details for Bed Mobility Assistance: Supervision and cues for safety/technique.  Transfers Transfers: Sit to Stand;Stand to Sit Sit to Stand: 4: Min guard;With upper extremity assist;From bed Stand to Sit: 4: Min guard;With upper extremity assist;With armrests;To chair/3-in-1 Details for Transfer Assistance: Min cues for hand placement and  safety.  Ambulation/Gait Ambulation/Gait Assistance: 4: Min guard Ambulation Distance (Feet): 160 Feet (took 3-4 standing rest breaks in between) Assistive device: Rolling walker Ambulation/Gait Assistance Details: Min cues for pursed lip breathing (pt heavy mouth breather) and for maintaining position inside of RW. Pt states 7/10 RPE while ambulating.  Gait Pattern: Step-through pattern;Decreased stride length Gait velocity: decreased Stairs: No Wheelchair Mobility Wheelchair Mobility: No    Shoulder Instructions     Exercises     PT Diagnosis: Difficulty walking;Generalized weakness  PT Problem List: Decreased strength;Decreased activity tolerance;Decreased balance;Decreased mobility;Decreased knowledge of use of DME;Cardiopulmonary status limiting activity PT Treatment Interventions: DME instruction;Gait training;Stair training;Functional mobility training;Therapeutic activities;Therapeutic exercise;Balance training;Patient/family education   PT Goals Acute Rehab PT Goals PT Goal Formulation: With patient/family Time For Goal Achievement: 02/07/12 Potential to Achieve Goals: Good Pt will go Supine/Side to Sit: with supervision;with HOB 0 degrees PT Goal: Supine/Side to Sit - Progress: Goal set today Pt will go Sit to Supine/Side: with supervision;with HOB 0 degrees PT Goal: Sit to Supine/Side - Progress: Goal set today Pt will go Sit to Stand: with modified independence PT Goal: Sit to Stand - Progress: Goal set today Pt will Ambulate: 51 - 150 feet;with modified independence;with least restrictive assistive device PT Goal: Ambulate - Progress: Goal set today Pt will Go Up / Down Stairs: 3-5 stairs;with supervision;with least restrictive assistive device;with rail(s) PT Goal: Up/Down Stairs - Progress: Goal set today  Visit Information  Last PT Received On: 01/31/12 Assistance Needed: +1 PT/OT Co-Evaluation/Treatment: Yes    Subjective Data  Subjective: I stay in the car  when my wife  goes to the store.  Patient Stated Goal: n/a   Prior Functioning  Home Living Lives With: Spouse Available Help at Discharge: Family;Available 24 hours/day Type of Home: House Home Access: Stairs to enter [employer] of Steps: 3 Entrance Stairs-Rails: Right;Left;Can reach both Home Layout: Laundry or work area in basement;Able to live on main level with bedroom/bathroom Bathroom Shower/Tub: Health visitor: Handicapped height Home Adaptive Equipment: Engineer, drilling - standard;Bedside commode/3-in-1 Prior Function Level of Independence: Needs assistance Needs Assistance: Bathing;Dressing;Meal Prep;Light Housekeeping Bath: Minimal Dressing: Minimal Meal Prep: Total Light Housekeeping: Total Driving: No Vocation: Retired Musician: No difficulties Dominant Hand: Right    Cognition  Overall Cognitive Status: Appears within functional limits for tasks assessed/performed Arousal/Alertness: Awake/alert Orientation Level: Appears intact for tasks assessed Behavior During Session: [facility] for tasks performed    Extremity/Trunk Assessment Right Upper Extremity Assessment RUE ROM/Strength/Tone: [facility] for tasks assessed Left Upper Extremity Assessment LUE ROM/Strength/Tone: [facility] for tasks assessed Right Lower Extremity Assessment RLE ROM/Strength/Tone: [facility] for tasks assessed;Deficits RLE ROM/Strength/Tone Deficits: Recent cellulitis in R knee, s/p arthrocentesis.  RLE Sensation: WFL - Light Touch Left Lower Extremity Assessment LLE ROM/Strength/Tone: WFL for tasks assessed LLE Sensation: WFL - Light Touch Trunk Assessment Trunk Assessment: Kyphotic   Balance    End of Session PT - End of Session Equipment Utilized During Treatment: Oxygen Activity Tolerance: Patient limited by fatigue Patient left: in chair;with call bell/phone within reach;with family/visitor present Nurse Communication: Mobility status  GP      Page, Meribeth Mattes 01/31/2012, 9:24 AM

## 2012-01-31 NOTE — Progress Notes (Signed)
ANTIBIOTIC CONSULT NOTE - follow up  Pharmacy Consult for:  Vancomycin Indication:  Cellulitis of right lower extremity  No Known Allergies  Patient Measurements: Height: 5\' 7"  (170.2 cm) (stated by wife) Weight: 218 lb 14.7 oz (99.3 kg) IBW/kg (Calculated) : 66.1   Vital Signs: Temp: 97.5 F (36.4 C) (11/15 0907) Temp src: Oral (11/15 0907) BP: 139/66 mmHg (11/15 0907) Pulse Rate: 80  (11/15 0907)  Labs:  Basename 01/31/12 0330 01/30/12 0420 01/29/12 1750  WBC 9.6 13.8* 18.2*  HGB 11.4* 11.5* 12.7*  PLT 313 324 313  LABCREA -- -- --  CREATININE 1.88* 1.69* 1.56*   Estimated Creatinine Clearance: 37 ml/min (by C-G formula based on Cr of 1.88).  Medical History: Past Medical History  Diagnosis Date  . Stricture and stenosis of esophagus 12/07/2004    EGD done on 12/07/2004  . GERD (gastroesophageal reflux disease)   . Hypoxemia   . Increased prostate specific antigen (PSA) velocity 02/26/2011  . DM (diabetes mellitus) 02/26/2011  . Gout 02/26/2011  . COPD (chronic obstructive pulmonary disease)     oxygen at home, 3L 24/7  . OSA (obstructive sleep apnea) 02/26/2011    CPAP  . Atrial fibrillation   . Depression   . Cardiac arrest 2009    while in hospital   . Coronary artery disease     non obstructive cath 2006  . Hypertension   . HTN (hypertension) 02/26/2011  . Pacemaker   . Cardiac arrest 2009    while in hospital  . Cancer     lung s/p lobectomy x2 on RT lung  . Basal cell carcinoma of nose 02/26/2011  . Oxygen dependent   . ETOH abuse 01/29/2012    started drinking again - hx cardiac arrest in 2009 due to dt's with organ failure    Medications:  Scheduled:     . aspirin EC  81 mg Oral Daily  . atorvastatin  40 mg Oral Daily  . budesonide-formoterol  2 puff Inhalation BID  . cefTRIAXone (ROCEPHIN)  IV  1 g Intravenous Q24H  . citalopram  10 mg Oral Daily  . diltiazem  240 mg Oral Daily  . folic acid  1 mg Oral Daily  . [COMPLETED]  furosemide  40 mg Intravenous Once  . furosemide  40 mg Intravenous Q12H  . heparin  5,000 Units Subcutaneous Q8H  . hydrALAZINE  25 mg Oral BID  . insulin aspart  0-15 Units Subcutaneous TID WC  . magnesium oxide  400 mg Oral Daily  . metoprolol  50 mg Oral BID  . multivitamin  1 tablet Oral BID  . multivitamin with minerals  1 tablet Oral Daily  . omega-3 acid ethyl esters  2 g Oral Daily  . pantoprazole  80 mg Oral Q1200  . potassium chloride SA  20 mEq Oral Daily  . predniSONE  40 mg Oral QAC breakfast  . sodium chloride  10-40 mL Intracatheter Q12H  . sodium chloride  3 mL Intravenous Q12H  . sodium chloride  3 mL Intravenous Q12H  . temazepam  30 mg Oral QHS  . tiotropium  18 mcg Inhalation Daily  . vancomycin  1,500 mg Intravenous Q24H  . vitamin B-12  50 mcg Oral Daily  . vitamin E  600 Units Oral BID  . [DISCONTINUED] furosemide  40 mg Oral Daily  . [DISCONTINUED] furosemide  80 mg Oral Daily   Assessment:  Asked to assist with Vancomycin therapy for this 78 year-old male  with cellulitis of the right lower extremity.  Day #3 Vanc and Rocephin.  Remains afebrile, WBCs have improved.  SCr continues to rise, CrCl ~35.  No cultures  Goals of Therapy:   Vancomycin trough levels 10-15 mcg/ml  Eradication of infection  Plan:   Due to possibly worsening renal function, will check a Vanc trough tonight even though not quite at steady-state on 1500mg  q24h.  Charolotte Eke, PharmD, pager [phone]. 01/31/2012,1:01 PM.

## 2012-01-31 NOTE — Clinical Documentation Improvement (Signed)
RESPIRATORY FAILURE DOCUMENTATION CLARIFICATION QUERY   THIS DOCUMENT IS NOT A PERMANENT PART OF THE MEDICAL RECORD  TO RESPOND TO THE THIS QUERY, FOLLOW THE INSTRUCTIONS BELOW:  1. If needed, update documentation for the patient's encounter via the notes activity.  2. Access this query again and click edit on the In [employer].  3. After updating, or not, click F2 to complete all highlighted (required) fields concerning your review. Select "additional documentation in the medical record" OR "no additional documentation provided".  4. Click Sign note button.  5. The deficiency will fall out of your In Basket *Please let us know if you are not able to complete this workflow by phone or e-mail (listed below).  Please update your documentation within the medical record to reflect your response to this query.                                                                                    01/31/12  Dear Dr. [facility],  In a better effort to capture your patient's severity of illness, reflect appropriate length of stay and utilization of resources, a review of the patient medical record has revealed the following indicators.    Based on your clinical judgment, please clarify and document in a progress note and/or discharge summary the clinical condition associated with the following supporting information:  In responding to this query please exercise your independent judgment.  The fact that a query is asked, does not imply that any particular answer is desired or expected.  01/30/12 Progr note..." Continue home O2 and home CPAP at night for OSA." For accurate DX specificity & severity can noted tx be further linked to clinical cond being eval'd mon'd & tx'd. Thank you    Possible Clinical Conditions? Acute Respiratory Failure Acute on Chronic Respiratory Failure Chronic Respiratory Failure  Acute Respiratory Insufficiency Acute Respiratory Insufficiency following  surgery or trauma  Other Condition (please specify) Cannot Clinically Determine  Supporting Information: Risk Factors: 01/29/12 H&P..."increased cough and congestion (has h/o COPD on 3L home O2 at baseline)."  Signs&Symptoms: see above note  Diagnostics: Lab: No Current AGB rsults noted  Radiology: 01/29/12: CXR: IMPRESSION: 1. Cardiomegaly with progressive interstitial edema and effusions compatible with congestive heart failure. 2. Significant volume loss with elevation of the left hemidiaphragm.   Treatment: 01/30/12 Progr note..." Continue home O2 and home CPAP at night for OSA"..."Repeat cxr in 2 days to evaluate for improvement of the pulmonary edema."  Respiratory Treatment:  see above note                You may use possible, probable, or suspect with inpatient documentation. possible, probable, suspected diagnoses MUST be documented at the time of discharge  Reviewed:  Thank You,  Toribio Harbour, RN, BSN, CCDS Certified Clinical Documentation Specialist Pager: [phone]  Health Information Management 

## 2012-01-31 NOTE — Progress Notes (Signed)
OFFICE PROGRESS NOTE  CC  [name] Barre, MD [address] [facility] 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  PRIOR THERAPY: #1 patient underwent a right upper lobe resection for a stage II squamous cell carcinoma. He subsequently received 4 cycles of adjuvant chemotherapy consisting of Taxol and carboplatinum. All of his therapy was completed 02/11/2005.  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  CURRENT THERAPY: Observation  INTERVAL HISTORY: [name] Newcomer Sr. 78 y.o. male returns for followup visit per his wife's request. Patient apparently has been having significant amount of shortness of breath fatigue and really has not had any energy to do anything. She is concerned about the possibility of his lung cancer recurrence. He continues to [facility] Patient otherwise has not had any cough hemoptysis or hematemesis no fevers chills night sweats. He does have ongoing aches and pains from arthritis. He's been eating well and maintaining his weight. He is trying to exercise but he becomes very short winded. He is O2 dependent.  MEDICAL HISTORY: Past Medical History  Diagnosis Date  . Stricture and stenosis of esophagus 12/07/2004    EGD done on 12/07/2004  . GERD (gastroesophageal reflux disease)   . Hypoxemia   . Increased prostate specific antigen (PSA) velocity 02/26/2011  . DM (diabetes mellitus) 02/26/2011  . Gout 02/26/2011  . COPD (chronic obstructive pulmonary disease)     oxygen at home, 3L 24/7  . OSA (obstructive sleep apnea) 02/26/2011    CPAP  . Atrial fibrillation   . Depression   . Cardiac arrest 2009    while in hospital   . Coronary artery disease     non obstructive cath 2006  . Hypertension   . HTN (hypertension) 02/26/2011  . Pacemaker   .  Cardiac arrest 2009    while in hospital  . Cancer     lung s/p lobectomy x2 on RT lung  . Basal cell carcinoma of nose 02/26/2011  . Oxygen dependent   . ETOH abuse 01/29/2012    started drinking again - hx cardiac arrest in 2009 due to dt's with organ failure    ALLERGIES:   has no known allergies.  MEDICATIONS:  No current facility-administered medications for this visit.   No current outpatient prescriptions on file.   Facility-Administered Medications Ordered in Other Visits  Medication Dose Route Frequency Provider Last Rate Last Dose  . 0.9 %  sodium chloride infusion  250 mL Intravenous PRN Hillary Bow, DO      . albuterol (PROVENTIL HFA;VENTOLIN HFA) 108 (90 BASE) MCG/ACT inhaler 2 puff  2 puff Inhalation Q6H PRN Hillary Bow, DO      . ALPRAZolam Prudy Feeler) tablet 0.5 mg  0.5 mg Oral TID PRN Hillary Bow, DO   0.5 mg at 01/30/12 2251  . aspirin EC tablet 81 mg  81 mg Oral Daily Hillary Bow, DO   81 mg at 01/31/12 0910  . atorvastatin (LIPITOR) tablet 40 mg  40 mg Oral Daily Hillary Bow, DO   40 mg at 01/31/12 0908  . budesonide-formoterol (SYMBICORT) 160-4.5 MCG/ACT inhaler 2 puff  2 puff Inhalation BID Hillary Bow, DO   2 puff at 01/31/12 [phone]  . cefTRIAXone (ROCEPHIN) 1 g in dextrose 5 % 50 mL IVPB  1  g Intravenous Q24H Hillary Bow, DO 100 mL/hr at 01/30/12 2055 1 g at 01/30/12 2055  . citalopram (CELEXA) tablet 10 mg  10 mg Oral Daily Hillary Bow, DO   10 mg at 01/31/12 1000  . diltiazem (TIAZAC) 24 hr capsule 240 mg  240 mg Oral Daily Hillary Bow, DO   240 mg at 01/31/12 0910  . folic acid (FOLVITE) tablet 1 mg  1 mg Oral Daily Hillary Bow, DO   1 mg at 01/31/12 0910  . [COMPLETED] furosemide (LASIX) injection 40 mg  40 mg Intravenous Once Kathlen Mody, MD   40 mg at 01/30/12 2055  . furosemide (LASIX) injection 40 mg  40 mg Intravenous Q12H Kathlen Mody, MD   40 mg at 01/31/12 0635  . heparin injection 5,000 Units  5,000 Units  Subcutaneous Q8H Hillary Bow, DO   5,000 Units at 01/31/12 [phone]  . hydrALAZINE (APRESOLINE) tablet 25 mg  25 mg Oral BID Hillary Bow, DO   25 mg at 01/31/12 0909  . HYDROcodone-acetaminophen (NORCO/VICODIN) 5-325 MG per tablet 1-2 tablet  1-2 tablet Oral Q4H PRN Kathlen Mody, MD   1 tablet at 01/31/12 1122  . insulin aspart (novoLOG) injection 0-15 Units  0-15 Units Subcutaneous TID WC Hillary Bow, DO   2 Units at 01/31/12 0908  . LORazepam (ATIVAN) tablet 1 mg  1 mg Oral Q6H PRN Hillary Bow, DO       Or  . LORazepam (ATIVAN) injection 1 mg  1 mg Intravenous Q6H PRN Hillary Bow, DO      . magnesium oxide (MAG-OX) tablet 400 mg  400 mg Oral Daily Hillary Bow, DO   400 mg at 01/31/12 0910  . metoprolol tartrate (LOPRESSOR) tablet 50 mg  50 mg Oral BID Hillary Bow, DO   50 mg at 01/31/12 9604  . multivitamin (PROSIGHT) tablet 1 tablet  1 tablet Oral BID Toy Baker, MD      . multivitamin with minerals tablet 1 tablet  1 tablet Oral Daily Hillary Bow, DO   1 tablet at 01/31/12 1000  . omega-3 acid ethyl esters (LOVAZA) capsule 2 g  2 g Oral Daily Toy Baker, MD   2 g at 01/31/12 0910  . pantoprazole (PROTONIX) EC tablet 80 mg  80 mg Oral Q1200 Hillary Bow, DO   80 mg at 01/31/12 1208  . polyvinyl alcohol (LIQUIFILM TEARS) 1.4 % ophthalmic solution 2 drop  2 drop Both Eyes PRN Toy Baker, MD      . potassium chloride SA (K-DUR,KLOR-CON) CR tablet 20 mEq  20 mEq Oral Daily Hillary Bow, DO   20 mEq at 01/31/12 0910  . predniSONE (DELTASONE) tablet 40 mg  40 mg Oral QAC breakfast Kathlen Mody, MD   40 mg at 01/31/12 0910  . sodium chloride 0.9 % injection 10-40 mL  10-40 mL Intracatheter Q12H Kathlen Mody, MD      . sodium chloride 0.9 % injection 10-40 mL  10-40 mL Intracatheter PRN Kathlen Mody, MD   10 mL at 01/30/12 1759  . sodium chloride 0.9 % injection 3 mL  3 mL Intravenous Q12H Hillary Bow, DO   3 mL at 01/30/12 0917  . sodium  chloride 0.9 % injection 3 mL  3 mL Intravenous Q12H Hillary Bow, DO      . sodium chloride 0.9 % injection 3 mL  3 mL Intravenous PRN  Hillary Bow, DO      . temazepam (RESTORIL) capsule 30 mg  30 mg Oral QHS Hillary Bow, DO   30 mg at 01/30/12 2251  . tiotropium (SPIRIVA) inhalation capsule 18 mcg  18 mcg Inhalation Daily Hillary Bow, DO   18 mcg at 01/31/12 0942  . vancomycin (VANCOCIN) 1,500 mg in sodium chloride 0.9 % 500 mL IVPB  1,500 mg Intravenous Q24H Zella Ball, RPH   1,500 mg at 01/30/12 2243  . vitamin B-12 (CYANOCOBALAMIN) tablet 50 mcg  50 mcg Oral Daily Hillary Bow, DO   50 mcg at 01/31/12 0913  . vitamin E capsule 600 Units  600 Units Oral BID Hillary Bow, DO   600 Units at 01/31/12 0909  . [DISCONTINUED] furosemide (LASIX) tablet 40 mg  40 mg Oral Daily Toy Baker, MD   40 mg at 01/29/12 2326  . [DISCONTINUED] furosemide (LASIX) tablet 80 mg  80 mg Oral Daily Toy Baker, MD   80 mg at 01/30/12 1610    SURGICAL HISTORY:  Past Surgical History  Procedure Date  . Lung lobectomy     x2 lobes on RT lung  . Knee arthroscopy     bilateral  . Pace maker   . Tonsillectomy   . Hernia repair     x2, esophageal  . Cardiac catheterization   . Esophagogastroduodenoscopy 10/25/2011    Procedure: ESOPHAGOGASTRODUODENOSCOPY (EGD);  Surgeon: Hart Carwin, MD;  Location: [facility];  Service: Endoscopy;  Laterality: N/A;    REVIEW OF SYSTEMS:  Pertinent items are noted in HPI.   HEALTH MAINTENANCE:  PHYSICAL EXAMINATION: Blood pressure 154/84, pulse 71, temperature 98.7 F (37.1 C), temperature source Oral, resp. rate 20, height 5\' 5"  (1.651 m), weight 222 lb 8 oz (100.925 kg). Body mass index is 37.03 kg/(m^2). ECOG PERFORMANCE STATUS: 2 - Symptomatic, <50% confined to bed   General appearance: alert, cooperative, fatigued and moderately obese Resp: Distant breath sounds no rales or rhonchi Back: negative, symmetric, no curvature.  ROM normal. No CVA tenderness. Cardio: regular rate and rhythm GI: Abdomen is obese bowel sounds are present no hepatomegaly Extremities: extremities normal, atraumatic, no cyanosis or edema Neurologic: Grossly normal   LABORATORY DATA: Lab Results  Component Value Date   WBC 9.6 01/31/2012   HGB 11.4* 01/31/2012   HCT 34.9* 01/31/2012   MCV 89.5 01/31/2012   PLT 313 01/31/2012      Chemistry      Component Value Date/Time   NA 135 01/31/2012 0330   NA 136 01/20/2012 1413   NA 138 10/26/2010 1508   K 3.6 01/31/2012 0330   K 3.8 01/20/2012 1413   K 4.4 10/26/2010 1508   CL 98 01/31/2012 0330   CL 102 01/20/2012 1413   CL 95* 10/26/2010 1508   CO2 26 01/31/2012 0330   CO2 25 01/20/2012 1413   CO2 29 10/26/2010 1508   BUN 26* 01/31/2012 0330   BUN 31.0* 01/20/2012 1413   BUN 23* 10/26/2010 1508   CREATININE 1.88* 01/31/2012 0330   CREATININE 1.9* 01/20/2012 1413   CREATININE 1.3* 10/26/2010 1508      Component Value Date/Time   CALCIUM 8.5 01/31/2012 0330   CALCIUM 8.9 01/20/2012 1413   CALCIUM 8.7 10/26/2010 1508   ALKPHOS 88 01/30/2012 0420   ALKPHOS 96 01/20/2012 1413   ALKPHOS 97* 10/26/2010 1508   AST 19 01/30/2012 0420   AST 22 01/20/2012 1413   AST 25  10/26/2010 1508   ALT 19 01/30/2012 0420   ALT 23 01/20/2012 1413   BILITOT 0.3 01/30/2012 0420   BILITOT 0.47 01/20/2012 1413   BILITOT 0.60 10/26/2010 1508       RADIOGRAPHIC STUDIES:  Dg Chest 2 View  01/29/2012  *RADIOLOGY REPORT*  Clinical Data: Weakness and shortness of breath.  Chest pain.  CHEST - 2 VIEW  Comparison: 03/20/2011.  Findings: Cardiac enlargement is stable.  There is interval increase in a diffuse interstitial pattern compatible with edema. Bilateral pleural effusions are now present.  The left hemidiaphragm is markedly elevated.  Atherosclerotic calcifications are noted.  A left subclavian Port-A-Cath is stable.  IMPRESSION:  1.  Cardiomegaly with progressive interstitial edema and effusions compatible  with congestive heart failure. 2.  Significant volume loss with elevation of the left hemidiaphragm.   Original Report Authenticated By: Marin Roberts, [name]     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 Patient is becoming more and more short of breath wife is concerned about recurrence. She wants to be reassured. Therefore she wants to have a CT of the chest performed. He did recently have a chest x-ray done as well.  #2 thrombocytosis and leukocytosis.   PLAN:   #1 proceed with CT of the chest to rule out recurrence as a cause of his worsening dyspnea  #2 he will be seen back in a few months time.   All questions were answered. The patient knows to call the clinic with any problems, questions or concerns. We can certainly see the patient much sooner if necessary.  I spent25 minutes} counseling the patient face to face. The total time spent in the appointment was 30 minutes.    Drue Second, MD Medical/Oncology [facility] [phone] (beeper) [phone] (Office)  01/31/2012, 1:43 PM

## 2012-01-31 NOTE — Progress Notes (Signed)
[facility] PROGRESS NOTE  [name] Newcomer Sr. WUJ:[id number] DOB: 1932-06-13 DOA: 01/29/2012 PCP: [name], MD  Assessment/Plan: 1. Cellulitis - putting patient on rocephin and vancomycin given failure of outpatient ABx therapy, no evidence of septic arthritis per attempted arthrocentisis by ortho, leukocytosis has resolved.  Blood Cx if he runs a fever. 2. Acute on Chronic diastolic heart failure: his pro bnp is elevated. He got 3 doses of IV lasix, hisbreathing is better but his renal function worsened, so we will change it oral lasix.  Repeat PRO BNP in am has improved. Repeat cxr in 2 days to evaluate for improvement of the pulmonary edema. Echo in 03/13 showed good LVEF, and grade 1 diastolic dysfunction.  3. COPD - at baseline, no wheezing on exam or evidence of exacerbation, getting Abx anyhow for #1, no indication for steroids for COPD, (also just finished a course). Continue home O2 and home CPAP at night for OSA. 4. EtOH dependence - concerning and need to monitor for DTz, in 2009 patient had DTz and went into cardiac arrest. Quit drinking since then but resumed again just recently. 5. ? Gouty flare: patient is on lasix and this could be gouty flare, we will start her on steroids, with prednisone 40 mg daily for 3 days with quick taper.  6. Diabetes Mellitus: diet controlled. hgba1c is 6.1. On SSI. 7. Hypertension; controlled.  8. Atrial fibrillation: rate controlled. On cardizem.  9. DVT prophylaxis: sq heparin.      Code Status: full code Family Communication: family at bed side Disposition Plan: possibly in a day or two.    Consultants:  none  Antibiotics:  Vancomycin and rocephin day 3  HPI/Subjective: Comfortable.  Objective: Filed Vitals:   01/31/12 0647 01/31/12 0907 01/31/12 0943 01/31/12 1417  BP:  139/66  159/76  Pulse:  80  79  Temp:  97.5 F (36.4 C)  98.4 F (36.9 C)  TempSrc:  Oral  Oral  Resp:  18  18  Height:      Weight: 99.3 kg (218 lb  14.7 oz)     SpO2:  97% 98% 95%    Intake/Output Summary (Last 24 hours) at 01/31/12 1430 Last data filed at 01/31/12 1418  Gross per 24 hour  Intake   1280 ml  Output   2180 ml  Net   -900 ml   Filed Weights   01/29/12 2243 01/31/12 0647  Weight: 99.2 kg (218 lb 11.1 oz) 99.3 kg (218 lb 14.7 oz)    Exam:  General: NAD, resting comfortably in bed Cardiovascular: RRR w/o MRG  Respiratory: CTA B, not really able to appreciate any rhonchi in either lung, no wheezes  Abdomen: soft, nt, nd, bs+  Skin: erythema within the outlined area on his R patella, bandaid over the R knee from arthrocentesis.  Musculoskeletal: MAE, full ROM all 4 extremities, pedal edema present.    Data Reviewed: Basic Metabolic Panel:  Lab 01/31/12 9562 01/30/12 0420 01/29/12 1750  NA 135 138 137  K 3.6 3.7 4.3  CL 98 98 97  CO2 26 29 27   GLUCOSE 169* 123* 116*  BUN 26* 22 21  CREATININE 1.88* 1.69* 1.56*  CALCIUM 8.5 8.6 8.9  MG 2.0 1.9 --  PHOS -- -- --   Liver Function Tests:  Lab 01/30/12 0420  AST 19  ALT 19  ALKPHOS 88  BILITOT 0.3  PROT 5.8*  ALBUMIN 2.5*   No results found for this basename: LIPASE:5,AMYLASE:5 in the last  168 hours No results found for this basename: AMMONIA:5 in the last 168 hours CBC:  Lab 01/31/12 0330 01/30/12 0420 01/29/12 1750  WBC 9.6 13.8* 18.2*  NEUTROABS -- -- 14.4*  HGB 11.4* 11.5* 12.7*  HCT 34.9* 34.5* 38.1*  MCV 89.5 89.4 89.6  PLT 313 324 313   Cardiac Enzymes:  Lab 01/29/12 1750  CKTOTAL --  CKMB --  CKMBINDEX --  TROPONINI <0.30   BNP (last 3 results)  Basename 01/31/12 0330 01/29/12 1750 12/25/11 1124  PROBNP 1901.0* 3379.0* 78.0   CBG:  Lab 01/31/12 1125 01/31/12 0800 01/30/12 2039 01/30/12 1719 01/30/12 1213  GLUCAP 117* 128* 200* 116* 112*    No results found for this or any previous visit (from the past 240 hour(s)).   Studies: Dg Chest 2 View  01/29/2012  *RADIOLOGY REPORT*  Clinical Data: Weakness and shortness of  breath.  Chest pain.  CHEST - 2 VIEW  Comparison: 03/20/2011.  Findings: Cardiac enlargement is stable.  There is interval increase in a diffuse interstitial pattern compatible with edema. Bilateral pleural effusions are now present.  The left hemidiaphragm is markedly elevated.  Atherosclerotic calcifications are noted.  A left subclavian Port-A-Cath is stable.  IMPRESSION:  1.  Cardiomegaly with progressive interstitial edema and effusions compatible with congestive heart failure. 2.  Significant volume loss with elevation of the left hemidiaphragm.   Original Report Authenticated By: Marin Roberts, [name]     Scheduled Meds:    . aspirin EC  81 mg Oral Daily  . atorvastatin  40 mg Oral Daily  . budesonide-formoterol  2 puff Inhalation BID  . cefTRIAXone (ROCEPHIN)  IV  1 g Intravenous Q24H  . citalopram  10 mg Oral Daily  . diltiazem  240 mg Oral Daily  . folic acid  1 mg Oral Daily  . [COMPLETED] furosemide  40 mg Intravenous Once  . furosemide  40 mg Intravenous Q12H  . heparin  5,000 Units Subcutaneous Q8H  . hydrALAZINE  25 mg Oral BID  . insulin aspart  0-15 Units Subcutaneous TID WC  . magnesium oxide  400 mg Oral Daily  . metoprolol  50 mg Oral BID  . multivitamin  1 tablet Oral BID  . multivitamin with minerals  1 tablet Oral Daily  . omega-3 acid ethyl esters  2 g Oral Daily  . pantoprazole  80 mg Oral Q1200  . potassium chloride SA  20 mEq Oral Daily  . predniSONE  40 mg Oral QAC breakfast  . sodium chloride  10-40 mL Intracatheter Q12H  . sodium chloride  3 mL Intravenous Q12H  . sodium chloride  3 mL Intravenous Q12H  . temazepam  30 mg Oral QHS  . tiotropium  18 mcg Inhalation Daily  . vancomycin  1,500 mg Intravenous Q24H  . vitamin B-12  50 mcg Oral Daily  . vitamin E  600 Units Oral BID  . [DISCONTINUED] furosemide  40 mg Oral Daily  . [DISCONTINUED] furosemide  80 mg Oral Daily   Continuous Infusions:   Principal Problem:  *Cellulitis Active  Problems:  CHRONIC OBSTRUCTIVE PULMONARY DISEASE, SEVERE  Hypercholesterolemia  OSA (obstructive sleep apnea)  HTN (hypertension)  DM (diabetes mellitus)  Atrial fibrillation  EtOH dependence  Chronic diastolic CHF (congestive heart failure)       Melchor Kirchgessner  [facility] Pager [phone]. If 8PM-8AM, please contact night-coverage at [email or web address], password [facility] 01/31/2012, 2:30 PM  LOS: 2 days

## 2012-01-31 NOTE — Progress Notes (Signed)
RT set patient up on CPAP for the night. Patient on 13cm H2O per home settings with 3L of O2 bled in. Sterile water added to fill line. Patient states he is comfortable family at bedside, RN aware.

## 2012-01-31 NOTE — Progress Notes (Signed)
Pt had 10 beat run of 10 PVC's.  Pt sleeping at time, in NAD.  Notified NP on call, L. [place] and no new orders.  Will continue to monitor pt through the night.

## 2012-02-01 LAB — CBC
HCT: 34.9 % — ABNORMAL LOW (ref 39.0–52.0)
Hemoglobin: 11.6 g/dL — ABNORMAL LOW (ref 13.0–17.0)
WBC: 14.7 10*3/uL — ABNORMAL HIGH (ref 4.0–10.5)

## 2012-02-01 LAB — BASIC METABOLIC PANEL
BUN: 29 mg/dL — ABNORMAL HIGH (ref 6–23)
Chloride: 100 mEq/L (ref 96–112)
GFR calc Af Amer: 47 mL/min — ABNORMAL LOW (ref >90)
Glucose, Bld: 129 mg/dL — ABNORMAL HIGH (ref 70–99)
Potassium: 3.5 mEq/L (ref 3.5–5.1)
Sodium: 136 mEq/L (ref 135–145)

## 2012-02-01 MED ORDER — DOXYCYCLINE HYCLATE 100 MG PO TABS: 100.0000 mg | ORAL_TABLET | Freq: Two times a day (BID) | ORAL | Status: DC

## 2012-02-01 MED ORDER — HYDROCODONE-ACETAMINOPHEN 5-325 MG PO TABS: 1.0000 | ORAL_TABLET | ORAL | Status: DC | PRN

## 2012-02-01 MED ORDER — AMOXICILLIN 500 MG PO CAPS: 500.0000 mg | ORAL_CAPSULE | Freq: Two times a day (BID) | ORAL | Status: DC

## 2012-02-01 MED ORDER — PREDNISONE (PAK) 10 MG PO TABS: 10.0000 mg | ORAL_TABLET | Freq: Every day | ORAL | Status: DC

## 2012-02-01 MED ORDER — PREDNISONE 20 MG PO TABS
20.0000 mg | ORAL_TABLET | Freq: Every day | ORAL | Status: AC
  Administered 2012-02-02: 20 mg via ORAL
  Filled 2012-02-01: qty 1

## 2012-02-01 MED ORDER — HYDRALAZINE HCL 20 MG/ML IJ SOLN
10.0000 mg | Freq: Four times a day (QID) | INTRAMUSCULAR | Status: DC | PRN
  Administered 2012-02-01: 10 mg via INTRAVENOUS
  Filled 2012-02-01: qty 1

## 2012-02-01 NOTE — Discharge Summary (Signed)
Physician Discharge Summary  [name] ZOX:[id number] DOB: [birth year] DOA: 01/29/2012  PCP: [name], MD  Admit date: 01/29/2012 Discharge date: 02/01/2012  Time spent: 45 minutes  Recommendations for Outpatient Follow-up:  1. Follow up with cardiology as recommended.   Discharge Diagnoses:  Principal Problem:  *Cellulitis Active Problems:  CHRONIC OBSTRUCTIVE PULMONARY DISEASE, SEVERE  Hypercholesterolemia  OSA (obstructive sleep apnea)  HTN (hypertension)  DM (diabetes mellitus)  Atrial fibrillation  EtOH dependence  Chronic diastolic CHF (congestive heart failure) Acute on chronic respiratory failure.   Discharge Condition: stable  Diet recommendation: low sodium diet  Filed Weights   01/29/12 2243 01/31/12 0647 02/01/12 0457  Weight: 99.2 kg (218 lb 11.1 oz) 99.3 kg (218 lb 14.7 oz) 101.2 kg (223 lb 1.7 oz)    History of present illness:  ANTHONYJAMES FRIEDEL is a 78 y.o. male who presents to the ED today after visiting his orthopaedic surgeon for ongoing cellulitis of his R knee onset about a week ago, characterized as erythema and swelling, improved with antibiotics but came back after completing a course of ABx at home, located on his R leg in the suprapatellar region associated with increased cough and congestion (has h/o COPD on 3L home O2 at baseline).  Orthopaedic surgeon attempted arthrocentesis of the knee joint due to concern for septic arthritis but was unable to aspirate any fluid, patient was sent to ED for cellulitis. In ED patient found to have cellulitis, WBC 18k, and pleural effusions with a CHF picture (patient does report he has not taken his lasix this afternoon). Hospitalist has been asked to admit.   Hospital Course:  Cellulitis -  patient was put on rocephin and vancomycin given failure of outpatient ABx therapy, no evidence of septic arthritis per attempted arthrocentisis by ortho, leukocytosis has improved. He completed the course of the  antibiotics. Cellulitis has resolved.   Acute on Chronic diastolic heart failure: his pro bnp is elevated. He got 3 doses of IV lasix, hisbreathing is better but his renal function worsened, so we have changed it oral lasix. Repeat PRO BNP in am has improved. Repeat cxr in 2 days to evaluate for improvement of the pulmonary edema. Echo in 03/13 showed good LVEF, and grade 1 diastolic dysfunction.   COPD - at baseline, no wheezing on exam or evidence of exacerbation, no indication for steroids for COPD, (also just finished a course). Continue home O2 and home CPAP at night for OSA.  EtOH dependence no DT's since admission.   Gouty flare: patient is on lasix and this could be gouty flare, we have started her on steroids,  with quick taper.  Diabetes Mellitus: diet controlled. hgba1c is 6.1. On SSI.  Hypertension; controlled.   Atrial fibrillation: rate controlled. On cardizem.       Discharge Exam: Filed Vitals:   02/01/12 0612 02/01/12 0833 02/01/12 1024 02/01/12 1046  BP: 178/98 153/87  161/93  Pulse:      Temp:      TempSrc:      Resp:      Height:      Weight:      SpO2:   99%   General: NAD, resting comfortably in bed  Cardiovascular: RRR w/o MRG  Respiratory: CTA B, not really able to appreciate any rhonchi in either lung, no wheezes  Abdomen: soft, nt, nd, bs+  Skin: erythema within the outlined area on his R patella, bandaid over the R knee from arthrocentesis.  Musculoskeletal: MAE, full  ROM all 4 extremities, pedal edema present.    Discharge Instructions  Discharge Orders    Future Appointments: Provider: Department: Dept Phone: Center:   02/05/2012 10:00 AM [facility] [phone] [place]   02/19/2012 3:00 PM Victorino December, MD [facility] MEDICAL ONCOLOGY [phone] None   02/27/2012 1:45 PM Corwin Levins, MD [facility] [phone] [facility]   04/13/2012 11:55 AM [facility] Main Office [place]) [phone] [facility]   06/10/2012 11:30 AM Cassell Clement, MD [facility] Main Office [place]) [phone] [facility]   06/10/2012 11:45 AM [facility] Main Office [place]) [phone] [facility]     Future Orders Please Complete By Expires   Diet - low sodium heart healthy      Increase activity slowly      Discharge instructions      Comments:   FOLLOW UP WITH CARDIOLOGY IN 1 TO 2 WEEKS.       Medication List     As of 02/01/2012 11:32 AM    STOP taking these medications         metFORMIN 500 MG tablet   Commonly known as: GLUCOPHAGE      predniSONE 10 MG tablet   Commonly known as: DELTASONE      TAKE these medications         albuterol 108 (90 BASE) MCG/ACT inhaler   Commonly known as: PROVENTIL HFA;VENTOLIN HFA   Inhale 2 puffs into the lungs every 6 (six) hours as needed for wheezing.      ALPRAZolam 0.5 MG tablet   Commonly known as: XANAX   Take 0.5 mg by mouth 3 (three) times daily as needed. For anxiety/sleeplessness.      amoxicillin 500 MG capsule   Commonly known as: AMOXIL   Take 1 capsule (500 mg total) by mouth 2 (two) times daily.      aspirin 81 MG tablet   Take 81 mg by mouth daily.      atorvastatin 40 MG tablet   Commonly known as: LIPITOR   Take 1 tablet (40 mg total) by mouth daily.      budesonide-formoterol 160-4.5 MCG/ACT inhaler   Commonly known as: SYMBICORT   Inhale 2 puffs into the lungs 2 (two) times daily.      citalopram 10 MG tablet   Commonly known as: CELEXA   Take 1 tablet (10 mg total) by mouth daily.      colchicine 0.6 MG tablet   Take 0.6 mg by mouth daily as needed. For gout flares      DIASENSE MAGNESIUM 400 (241.3 MG) MG tablet   Generic drug: magnesium oxide   Take 1 tablet by mouth daily.      diltiazem 240 MG 24 hr capsule   Commonly known as: TIAZAC   Take 1 capsule (240 mg total) by mouth daily.      doxycycline 100 MG  tablet   Commonly known as: VIBRA-TABS   Take 1 tablet (100 mg total) by mouth 2 (two) times daily.      esomeprazole 40 MG capsule   Commonly known as: NEXIUM   Take 40 mg by mouth daily as needed. For acid reflux      Eye Vitamins Tabs   Take 1 tablet by mouth 2 (two) times daily.      fish oil-omega-3 fatty acids 1000 MG capsule   Take 2 g by mouth daily.  furosemide 40 MG tablet   Commonly known as: LASIX   Take 40-80 mg by mouth 2 (two) times daily. Take two tabs (80mg ) in the am and one tab (40mg ) in the afternoon for one week, then back to 40 mg BID.      hydrALAZINE 25 MG tablet   Commonly known as: APRESOLINE   Take 25 mg by mouth 2 (two) times daily.      HYDROcodone-acetaminophen 5-325 MG per tablet   Commonly known as: NORCO/VICODIN   Take 1-2 tablets by mouth every 4 (four) hours as needed.      hydroxypropyl methylcellulose 2.5 % ophthalmic solution   Commonly known as: ISOPTO TEARS   Place 2 drops into both eyes as needed. For dry/irritated eyes.      metoprolol 50 MG tablet   Commonly known as: LOPRESSOR   Take 1 tablet (50 mg total) by mouth 2 (two) times daily.      multivitamin per tablet   Take 1 tablet by mouth daily.      potassium chloride SA 20 MEQ tablet   Commonly known as: K-DUR,KLOR-CON   Take 20 mEq by mouth daily.      predniSONE 10 MG tablet   Commonly known as: STERAPRED UNI-PAK   Take 1 tablet (10 mg total) by mouth daily. PREDNISONE 20 MG FOR 1 DAY FOLLOWED BY  PREDNISONE 10 MG FOR 1 DAY      temazepam 30 MG capsule   Commonly known as: RESTORIL   Take 30 mg by mouth at bedtime.      tiotropium 18 MCG inhalation capsule   Commonly known as: SPIRIVA   Place 1 capsule (18 mcg total) into inhaler and inhale daily.      vitamin B-12 100 MCG tablet   Commonly known as: CYANOCOBALAMIN   Take 50 mcg by mouth daily.      vitamin E 600 UNIT capsule   Take 600 Units by mouth 2 (two) times daily.          The results of  significant diagnostics from this hospitalization (including imaging, microbiology, ancillary and laboratory) are listed below for reference.    Significant Diagnostic Studies: Dg Chest 2 View  01/31/2012  *RADIOLOGY REPORT*  Clinical Data: Lung carcinoma.  Congestive heart failure.  CHEST - 2 VIEW  Comparison: 01/29/2012 and 03/20/2011  Findings: Cardiomegaly and pulmonary vascular congestion appears stable.  Changes of COPD are again seen.  Bibasilar pleural thickening shows no significant change, with no definite evidence of pleural effusion.  Transvenous pacemaker and Port-A-Cath remains in appropriate position.  Surgical clips again noted in the left upper quadrant.  IMPRESSION: Stable cardiomegaly and pulmonary vascular congestion.  Stable COPD and bibasilar pleural thickening.  No acute findings.   Original Report Authenticated By: Myles Rosenthal, [name]    Dg Chest 2 View  01/29/2012  *RADIOLOGY REPORT*  Clinical Data: Weakness and shortness of breath.  Chest pain.  CHEST - 2 VIEW  Comparison: 03/20/2011.  Findings: Cardiac enlargement is stable.  There is interval increase in a diffuse interstitial pattern compatible with edema. Bilateral pleural effusions are now present.  The left hemidiaphragm is markedly elevated.  Atherosclerotic calcifications are noted.  A left subclavian Port-A-Cath is stable.  IMPRESSION:  1.  Cardiomegaly with progressive interstitial edema and effusions compatible with congestive heart failure. 2.  Significant volume loss with elevation of the left hemidiaphragm.   Original Report Authenticated By: Marin Roberts, [name]     Microbiology: No results  found for this or any previous visit (from the past 240 hour(s)).   Labs: Basic Metabolic Panel:  Lab 02/01/12 1610 01/31/12 0330 01/30/12 0420 01/29/12 1750  NA 136 135 138 137  K 3.5 3.6 3.7 4.3  CL 100 98 98 97  CO2 27 26 29 27   GLUCOSE 129* 169* 123* 116*  BUN 29* 26* 22 21  CREATININE 1.58* 1.88* 1.69* 1.56*   CALCIUM 8.7 8.5 8.6 8.9  MG -- 2.0 1.9 --  PHOS -- -- -- --   Liver Function Tests:  Lab 01/30/12 0420  AST 19  ALT 19  ALKPHOS 88  BILITOT 0.3  PROT 5.8*  ALBUMIN 2.5*   No results found for this basename: LIPASE:5,AMYLASE:5 in the last 168 hours No results found for this basename: AMMONIA:5 in the last 168 hours CBC:  Lab 02/01/12 0500 01/31/12 0330 01/30/12 0420 01/29/12 1750  WBC 14.7* 9.6 13.8* 18.2*  NEUTROABS -- -- -- 14.4*  HGB 11.6* 11.4* 11.5* 12.7*  HCT 34.9* 34.9* 34.5* 38.1*  MCV 89.0 89.5 89.4 89.6  PLT 349 313 324 313   Cardiac Enzymes:  Lab 01/29/12 1750  CKTOTAL --  CKMB --  CKMBINDEX --  TROPONINI <0.30   BNP: BNP (last 3 results)  Basename 01/31/12 0330 01/29/12 1750 12/25/11 1124  PROBNP 1901.0* 3379.0* 78.0   CBG:  Lab 02/01/12 0821 01/31/12 2133 01/31/12 1723 01/31/12 1125 01/31/12 0800  GLUCAP 119* 256* 169* 117* 128*       Signed:  Lavone Weisel  [facility] 02/01/2012, 11:32 AM

## 2012-02-01 NOTE — Progress Notes (Signed)
ANTIBIOTIC CONSULT NOTE - FOLLOW UP  Pharmacy Consult for vancomycin Indication: cellulitis  No Known Allergies  Patient Measurements: Height: 5\' 7"  (170.2 cm) (stated by wife) Weight: 218 lb 14.7 oz (99.3 kg) IBW/kg (Calculated) : 66.1  Adjusted Body Weight:   Vital Signs: Temp: 98.9 F (37.2 C) (11/15 2025) Temp src: Oral (11/15 2025) BP: 168/71 mmHg (11/15 2025) Pulse Rate: 74  (11/15 2300) Intake/Output from previous day: 11/15 0701 - 11/16 0700 In: 720 [P.O.:720] Out: 1025 [Urine:1025] Intake/Output from this shift: Total I/O In: -  Out: 375 [Urine:375]  Labs:  [facility] 01/31/12 0330 01/30/12 0420 01/29/12 1750  WBC 9.6 13.8* 18.2*  HGB 11.4* 11.5* 12.7*  PLT 313 324 313  LABCREA -- -- --  CREATININE 1.88* 1.69* 1.56*   Estimated Creatinine Clearance: 37 ml/min (by C-G formula based on Cr of 1.88).  Basename 01/31/12 2130  VANCOTROUGH 14.7  VANCOPEAK --  VANCORANDOM --  GENTTROUGH --  GENTPEAK --  GENTRANDOM --  TOBRATROUGH --  TOBRAPEAK --  TOBRARND --  AMIKACINPEAK --  AMIKACINTROU --  AMIKACIN --     Microbiology: No results found for this or any previous visit (from the past 720 hour(s)).  Anti-infectives     Start     Dose/Rate Route Frequency Ordered Stop   01/30/12 2200   vancomycin (VANCOCIN) 1,500 mg in sodium chloride 0.9 % 500 mL IVPB        1,500 mg 250 mL/hr over 120 Minutes Intravenous Every 24 hours 01/29/12 2229     01/29/12 2230   vancomycin (VANCOCIN) 750 mg in sodium chloride 0.9 % 150 mL IVPB        750 mg 150 mL/hr over 60 Minutes Intravenous  Once 01/29/12 2229 01/30/12 0042   01/29/12 2200   vancomycin (VANCOCIN) 1,500 mg in sodium chloride 0.9 % 500 mL IVPB  Status:  Discontinued        1,500 mg 250 mL/hr over 120 Minutes Intravenous Every 24 hours 01/29/12 2143 01/29/12 2220   01/29/12 2100   cefTRIAXone (ROCEPHIN) 1 g in dextrose 5 % 50 mL IVPB        1 g 100 mL/hr over 30 Minutes Intravenous Every 24 hours  01/29/12 2058     01/29/12 1830   vancomycin (VANCOCIN) IVPB 1000 mg/200 mL premix        1,000 mg 200 mL/hr over 60 Minutes Intravenous  Once 01/29/12 1806 01/29/12 1940          Assessment: Patient with level at goal.   Goal of Therapy:  Vancomycin trough level 10-15 mcg/ml  Plan:  Follow up culture results Continue with current dose  Darlina Guys, Jacquenette Shone Crowford 02/01/2012,2:41 AM

## 2012-02-01 NOTE — Progress Notes (Signed)
[facility] PROGRESS NOTE  [name] Stevenson. UJW:[id number] DOB: 1932/07/19 DOA: 01/29/2012 PCP: [name], MD  Assessment/Plan: 1. Cellulitis - putting patient on rocephin and vancomycin given failure of outpatient ABx therapy, no evidence of septic arthritis per attempted arthrocentisis by ortho, leukocytosis has improved.  Blood Cx if he runs a fever. 2. Acute on Chronic diastolic heart failure: his pro bnp is elevated. He got 3 doses of IV lasix, hisbreathing is better but his renal function worsened, so we will change it oral lasix.  Repeat PRO BNP in am has improved. Repeat cxr in 2 days to evaluate for improvement of the pulmonary edema. Echo in 03/13 showed good LVEF, and grade 1 diastolic dysfunction.  3. COPD - at baseline, no wheezing on exam or evidence of exacerbation, getting Abx anyhow for #1, no indication for steroids for COPD, (also just finished a course). Continue home O2 and home CPAP at night for OSA. 4. EtOH dependence - concerning and need to monitor for DTz, in 2009 patient had DTz and went into cardiac arrest. Quit drinking since then but resumed again just recently. 5. ? Gouty flare: patient is on lasix and this could be gouty flare, we will start her on steroids, with prednisone 40 mg daily for 3 days with quick taper.  6. Diabetes Mellitus: diet controlled. hgba1c is 6.1. On SSI. 7. Hypertension; controlled.  8. Atrial fibrillation: rate controlled. On cardizem.  9. DVT prophylaxis: sq heparin.      Code Status: full code Family Communication: family at bed side Disposition Plan: possibly in a day or two.    Consultants:  none  Antibiotics:  Vancomycin and rocephin day 4  HPI/Subjective: Comfortable. Had hypertensive episode this am.   Objective: Filed Vitals:   02/01/12 0833 02/01/12 1024 02/01/12 1046 02/01/12 1343  BP: 153/87  161/93 130/75  Pulse:    81  Temp:    97.9 F (36.6 C)  TempSrc:    Oral  Resp:    18  Height:      Weight:       SpO2:  99%  97%    Intake/Output Summary (Last 24 hours) at 02/01/12 1747 Last data filed at 02/01/12 1549  Gross per 24 hour  Intake   1370 ml  Output   3230 ml  Net  -1860 ml   Filed Weights   01/29/12 2243 01/31/12 0647 02/01/12 0457  Weight: 99.2 kg (218 lb 11.1 oz) 99.3 kg (218 lb 14.7 oz) 101.2 kg (223 lb 1.7 oz)    Exam:  General: NAD, resting comfortably in bed Cardiovascular: RRR w/o MRG  Respiratory: CTA B, not really able to appreciate any rhonchi in either lung, no wheezes  Abdomen: soft, nt, nd, bs+  Skin: erythema within the outlined area on his R patella, bandaid over the R knee from arthrocentesis.  Musculoskeletal: MAE, full ROM all 4 extremities, pedal edema present.    Data Reviewed: Basic Metabolic Panel:  Lab 02/01/12 5621 01/31/12 0330 01/30/12 0420 01/29/12 1750  NA 136 135 138 137  K 3.5 3.6 3.7 4.3  CL 100 98 98 97  CO2 27 26 29 27   GLUCOSE 129* 169* 123* 116*  BUN 29* 26* 22 21  CREATININE 1.58* 1.88* 1.69* 1.56*  CALCIUM 8.7 8.5 8.6 8.9  MG -- 2.0 1.9 --  PHOS -- -- -- --   Liver Function Tests:  Lab 01/30/12 0420  AST 19  ALT 19  ALKPHOS 88  BILITOT 0.3  PROT 5.8*  ALBUMIN 2.5*   No results found for this basename: LIPASE:5,AMYLASE:5 in the last 168 hours No results found for this basename: AMMONIA:5 in the last 168 hours CBC:  Lab 02/01/12 0500 01/31/12 0330 01/30/12 0420 01/29/12 1750  WBC 14.7* 9.6 13.8* 18.2*  NEUTROABS -- -- -- 14.4*  HGB 11.6* 11.4* 11.5* 12.7*  HCT 34.9* 34.9* 34.5* 38.1*  MCV 89.0 89.5 89.4 89.6  PLT 349 313 324 313   Cardiac Enzymes:  Lab 01/29/12 1750  CKTOTAL --  CKMB --  CKMBINDEX --  TROPONINI <0.30   BNP (last 3 results)  Basename 01/31/12 0330 01/29/12 1750 12/25/11 1124  PROBNP 1901.0* 3379.0* 78.0   CBG:  Lab 02/01/12 1708 02/01/12 1237 02/01/12 0821 01/31/12 2133 01/31/12 1723  GLUCAP 219* 125* 119* 256* 169*    No results found for this or any previous visit (from the  past 240 hour(s)).   Studies: Dg Chest 2 View  01/31/2012  *RADIOLOGY REPORT*  Clinical Data: Lung carcinoma.  Congestive heart failure.  CHEST - 2 VIEW  Comparison: 01/29/2012 and 03/20/2011  Findings: Cardiomegaly and pulmonary vascular congestion appears stable.  Changes of COPD are again seen.  Bibasilar pleural thickening shows no significant change, with no definite evidence of pleural effusion.  Transvenous pacemaker and Port-A-Cath remains in appropriate position.  Surgical clips again noted in the left upper quadrant.  IMPRESSION: Stable cardiomegaly and pulmonary vascular congestion.  Stable COPD and bibasilar pleural thickening.  No acute findings.   Original Report Authenticated By: Myles Rosenthal, [name]     Scheduled Meds:    . aspirin EC  81 mg Oral Daily  . atorvastatin  40 mg Oral Daily  . budesonide-formoterol  2 puff Inhalation BID  . cefTRIAXone (ROCEPHIN)  IV  1 g Intravenous Q24H  . citalopram  10 mg Oral Daily  . diltiazem  240 mg Oral Daily  . folic acid  1 mg Oral Daily  . furosemide  40 mg Oral BID  . heparin  5,000 Units Subcutaneous Q8H  . hydrALAZINE  25 mg Oral BID  . insulin aspart  0-15 Units Subcutaneous TID WC  . magnesium oxide  400 mg Oral Daily  . metoprolol  50 mg Oral BID  . multivitamin  1 tablet Oral BID  . multivitamin with minerals  1 tablet Oral Daily  . omega-3 acid ethyl esters  2 g Oral Daily  . pantoprazole  80 mg Oral Q1200  . potassium chloride SA  20 mEq Oral Daily  . [COMPLETED] predniSONE  40 mg Oral QAC breakfast  . sodium chloride  10-40 mL Intracatheter Q12H  . sodium chloride  3 mL Intravenous Q12H  . sodium chloride  3 mL Intravenous Q12H  . temazepam  30 mg Oral QHS  . tiotropium  18 mcg Inhalation Daily  . vancomycin  1,500 mg Intravenous Q24H  . vitamin B-12  50 mcg Oral Daily  . vitamin E  600 Units Oral BID   Continuous Infusions:   Principal Problem:  *Cellulitis Active Problems:  CHRONIC OBSTRUCTIVE PULMONARY  DISEASE, SEVERE  Hypercholesterolemia  OSA (obstructive sleep apnea)  HTN (hypertension)  DM (diabetes mellitus)  Atrial fibrillation  EtOH dependence  Chronic diastolic CHF (congestive heart failure)       [name] Stevenson  [facility] Pager [phone]. If 8PM-8AM, please contact night-coverage at [email or web address], password [email or web address] 02/01/2012, 5:47 PM  LOS: 3 days

## 2012-02-02 ENCOUNTER — Inpatient Hospital Stay (HOSPITAL_COMMUNITY): Payer: Medicare Other

## 2012-02-02 DIAGNOSIS — E119 Type 2 diabetes mellitus without complications: Secondary | ICD-10-CM

## 2012-02-02 LAB — GLUCOSE, CAPILLARY: Glucose-Capillary: 100 mg/dL — ABNORMAL HIGH (ref 70–99)

## 2012-02-02 LAB — CBC
HCT: 35.8 % — ABNORMAL LOW (ref 39.0–52.0)
Hemoglobin: 12.2 g/dL — ABNORMAL LOW (ref 13.0–17.0)
MCV: 89.5 fL (ref 78.0–100.0)
RBC: 4 MIL/uL — ABNORMAL LOW (ref 4.22–5.81)
WBC: 15.9 10*3/uL — ABNORMAL HIGH (ref 4.0–10.5)

## 2012-02-02 LAB — BASIC METABOLIC PANEL
BUN: 29 mg/dL — ABNORMAL HIGH (ref 6–23)
CO2: 29 mEq/L (ref 19–32)
Chloride: 99 mEq/L (ref 96–112)
Creatinine, Ser: 1.59 mg/dL — ABNORMAL HIGH (ref 0.50–1.35)

## 2012-02-05 ENCOUNTER — Ambulatory Visit (HOSPITAL_COMMUNITY)
Admission: RE | Admit: 2012-02-05 | Discharge: 2012-02-05 | Disposition: A | Discharge status: Home / Self Care | Payer: Medicare Other | Source: Ambulatory Visit | Attending: Oncology | Admitting: Oncology

## 2012-02-05 ENCOUNTER — Telehealth: Payer: Self-pay | Admitting: Emergency Medicine

## 2012-02-05 DIAGNOSIS — N269 Renal sclerosis, unspecified: Secondary | ICD-10-CM | POA: Insufficient documentation

## 2012-02-05 DIAGNOSIS — K439 Ventral hernia without obstruction or gangrene: Secondary | ICD-10-CM | POA: Insufficient documentation

## 2012-02-05 DIAGNOSIS — Q619 Cystic kidney disease, unspecified: Secondary | ICD-10-CM | POA: Insufficient documentation

## 2012-02-05 DIAGNOSIS — C3490 Malignant neoplasm of unspecified part of unspecified bronchus or lung: Secondary | ICD-10-CM | POA: Insufficient documentation

## 2012-02-05 DIAGNOSIS — I714 Abdominal aortic aneurysm, without rupture, unspecified: Secondary | ICD-10-CM | POA: Insufficient documentation

## 2012-02-05 DIAGNOSIS — I2510 Atherosclerotic heart disease of native coronary artery without angina pectoris: Secondary | ICD-10-CM | POA: Insufficient documentation

## 2012-02-05 DIAGNOSIS — Z902 Acquired absence of lung [part of]: Secondary | ICD-10-CM | POA: Insufficient documentation

## 2012-02-05 MED ORDER — BUDESONIDE-FORMOTEROL FUMARATE 160-4.5 MCG/ACT IN AERO: 2.0000 | INHALATION_SPRAY | Freq: Two times a day (BID) | RESPIRATORY_TRACT | Status: DC

## 2012-02-05 MED ORDER — TIOTROPIUM BROMIDE MONOHYDRATE 18 MCG IN CAPS: 18.0000 ug | ORAL_CAPSULE | Freq: Every day | RESPIRATORY_TRACT | Status: DC

## 2012-02-05 NOTE — Telephone Encounter (Signed)
Shanda Bumps has given sample to patients wife.

## 2012-02-06 ENCOUNTER — Telehealth: Payer: Self-pay | Admitting: *Deleted

## 2012-02-06 NOTE — Telephone Encounter (Signed)
Message copied by Cooper Render on Thu Feb 06, 2012  5:01 PM ------      Message from: Victorino December      Created: Thu Feb 06, 2012  2:46 PM       Call patient: no new cancer

## 2012-02-06 NOTE — Telephone Encounter (Signed)
Per MD notified pt no new cancer. Pt verbalized understanding.

## 2012-02-07 ENCOUNTER — Ambulatory Visit (INDEPENDENT_AMBULATORY_CARE_PROVIDER_SITE_OTHER): Payer: Medicare Other | Admitting: Emergency Medicine

## 2012-02-07 ENCOUNTER — Encounter: Payer: Self-pay | Admitting: Emergency Medicine

## 2012-02-07 VITALS — BP 110/78 | HR 71 | Temp 98.7°F | Ht 67.5 in | Wt 224.4 lb

## 2012-02-07 DIAGNOSIS — J449 Chronic obstructive pulmonary disease, unspecified: Secondary | ICD-10-CM

## 2012-02-07 DIAGNOSIS — C3490 Malignant neoplasm of unspecified part of unspecified bronchus or lung: Secondary | ICD-10-CM

## 2012-02-07 DIAGNOSIS — G4733 Obstructive sleep apnea (adult) (pediatric): Secondary | ICD-10-CM

## 2012-02-07 NOTE — Patient Instructions (Addendum)
Please continue your oxygen and your inhaled medications as you are taking them Follow with Dr [name] in 2 months or sooner if you have any problems.

## 2012-02-07 NOTE — Assessment & Plan Note (Signed)
NIPPV [id number]

## 2012-02-07 NOTE — Progress Notes (Signed)
Subjective:    Patient ID: [name] Newcomer Sr., male    DOB: May 17, 1932, 78 y.o.   MRN: [id number] HPI 78 yo man, with severe COPD and chronic hypoxemia. He also has a history of non-small cell lung cancer, status post right upper lobectomy and adjuvant chemotherapy in 2006.   ROV 10/25/09 -- COPD and OSA. Tells me has been feeling well. Last Ct scan showed no evidence recurrance of his lung CA. Breathing has been OK, the heat has limited his activity. Last time we changed Advair to Symbicort - has helped his irritated throat, has also made his breathing a bit better. Never had to stop his Lisinopril. No new problems, no exacerbations.   ROV 05/04/10 -- Hx COPD and OSA, also hx NSCLCA and RUL lobectomy. Reports that he was treated for AE-COPD with abx and pred. Has been on Spiriva and Symbicort, cut back on the symbicort due to cost. Has been using once daily instead of two times a day. Had the pneumovax 7 yrs ago, shouldn't tneed it again. Having CT scans of the chest followed by Dr Welton Flakes, annually. Wears CPAP every night.   ROV 10/19/10 -- COPD, OSA, NSCLCA s/p RUL lobectomy. He had been getting CT scans annually, planning to see Dr Welton Flakes. May be having some decreased energy since February. He has been having difficulty with his swallowing - some dysphagia, food getting stuck. Sometimes causes him to get choked. Wears CPAP mask reliably.  Only using Symbicort daily, Spiriva.   03/20/2011 Pt ill for one week.  Rx levaquin x 5days 750/d. Not improved.  Severe dyspnea at rest. Not able to walk. No real chest pain.  Notes more wheezing.  Severe dyspnea even at rest.  No real fever T99.7  At home.  Did get flu vaccine in November. >>RX Avelox x 7 days , steroid taper.XR w/ no acute changes   Follow up  Pt returns for a 1 week follow up for COPD flare. Seen in office last week, tx with Avelox and steroid taper. CXR with no acute process last ov.  Feeling some better but still has a lot of coughing and  congestion  Finished Avelox yesterday. Has few days left on prednisone.  No hemotpysis or chest pain .   ROV 04/18/11 -- Severe COPD, OSA, NSCLCA s/p RUL lobectomy. He was seen beginning of the month for an exacerbation following probable URI, CXR showed no infiltrates. He was treated w steroids and abx. He is improved, close to baseline. Using Spiriva and Symbicort.   ROV 06/05/11 -- Severe COPD, OSA, NSCLCA s/p RUL lobectomy. He still hasn't gotten back to prior baseline since URI and AE. He is on 3L/min pulsed. He does exert but w difficulty. He is on Spiriva + Symbicort.   ROV 08/29/11 -- Severe COPD, OSA, NSCLCA s/p RUL lobectomy. He has remained active, still limited w exertion. He is on Spiriva + Symbicort. No exacerbations or pred, no abx. No cough or wheeze. He is having more LE edema, has started on lasix. Wears his CPAP reliably  ROV 11/28/11 -- Severe COPD, OSA, NSCLCA s/p RUL lobectomy. He has been doing well, but probably less active this year. He and his wife have worried about his oxygen, the tanks. Once they ran out on a trip from [place] and they were panicked. He hasn't had any exacerbations, hospitalizations. Remains on Spiriva + Symbicort. He is having gout flare L great toe. He had EGD for anemia eval >> reassuring.   01/14/12  Acute  Productive cough, increased SOB, wheezing, tightness in chest onset today, began with head congestion w/ clear drainage x1 week -  Cough is getting worse .  otc cold meds not working No hemoptysis or chest pain  No edema.   ROV 02/07/12 -- Severe COPD, OSA, NSCLCA s/p RUL lobectomy. Also with hx CAD, HTN, A Fib, diastolic CHF. He was admitted last week for cellulitis vs septic arthritis (no fluid able to be aspirated). He was d/c on his same BD's, NIPPV, abx (finished).       Objective:   Physical Exam Filed Vitals:   02/07/12 1333  BP: 110/78  Pulse: 71  Temp: 98.7 F (37.1 C)    GEN: A/Ox3; pleasant , NAD  HEENT:  [place]/AT,   EACs-clear, TMs-wnl, NOSE-clear drainage, THROAT-clear, no lesions, no postnasal drip or exudate noted.   NECK:  Supple w/ fair ROM; no JVD; normal carotid impulses w/o bruits; no thyromegaly or nodules palpated; no lymphadenopathy.  RESP  no accessory muscle use, no crackles, no wheezing  CARD:  RRR, no m/r/g  , no peripheral edema, pulses intact, no cyanosis or clubbing.  Musco: Warm bil, 1+ RLE edema, trace LLE edema  Neuro: alert, no focal deficits noted.    Skin: Warm, no lesions or rashes  CT scan 02/05/12 --  Comparison: 08/07/2007  CT CHEST  Findings: Lungs/pleura: There is no pleural effusion. Mild  changes of centrilobular emphysema identified. Status post right  lower lobectomy. Focal area of subpleural scar is identified in  the right upper lobe, image 15. New from previous exam. No  specific features identified to suggest residual or recurrence of  tumor.  Heart/Mediastinum: There is a left chest wall porta-catheter with  tip in the cavoatrial junction. Right chest wall pacer device is  noted with tips in the right atrial appendage and right ventricle.  Calcified atherosclerotic disease affects the LAD, RCA and left  circumflex coronary arteries. There is no mediastinal or hilar  adenopathy identified.  Bones/Musculoskeletal: There is no axillary or supraclavicular  adenopathy. Postoperative changes involving the right posterior  ribs identified. Chronic healed left posterior rib fracture  deformity also noted. There are no aggressive lytic or sclerotic  bone lesions identified.  IMPRESSION:  1. Essentially stable examination without specific features to  suggest residual or recurrence of tumor.  2. Prior right lower lobectomy.  3. Coronary artery calcifications.      Assessment & Plan:  OSA (obstructive sleep apnea) NIPPV qhs  CHRONIC OBSTRUCTIVE PULMONARY DISEASE, SEVERE continue same regimen  CARCINOMA, LUNG, SQUAMOUS CELL Stable by repeat CT scan  02/05/12

## 2012-02-07 NOTE — Assessment & Plan Note (Signed)
-   continue same regimen 

## 2012-02-07 NOTE — Assessment & Plan Note (Signed)
Stable by repeat CT scan 02/05/12

## 2012-02-11 ENCOUNTER — Encounter: Payer: Self-pay | Admitting: *Deleted

## 2012-02-12 ENCOUNTER — Other Ambulatory Visit: Payer: Self-pay

## 2012-02-12 MED ORDER — TEMAZEPAM 30 MG PO CAPS: 30.0000 mg | ORAL_CAPSULE | Freq: Every day | ORAL | Status: DC

## 2012-02-12 NOTE — Telephone Encounter (Signed)
Done hardcopy to robin  

## 2012-02-12 NOTE — Telephone Encounter (Signed)
Faxed hardcopy to pharmacy. 

## 2012-02-17 ENCOUNTER — Telehealth: Payer: Self-pay | Admitting: *Deleted

## 2012-02-17 DIAGNOSIS — C3490 Malignant neoplasm of unspecified part of unspecified bronchus or lung: Secondary | ICD-10-CM

## 2012-02-17 NOTE — Telephone Encounter (Signed)
Pt's wife called states " willies has had cellulitis and took the 1 round of amoxicillin. Then about 2-3 days later it came back. So we saw Dr. Avel Peace and he called in another dose of Amoxicillin this time 875mg  BID. I'm just scared to death it's going to get into his lymph nodes and then what am I going to do?" Pt is not having shortness of breath, fever, chest pain. Reviewed with MD, pt to be seen at 1030am 12/3 with labs prior. Notified Pt's wife. onc tx sent

## 2012-02-17 NOTE — Telephone Encounter (Signed)
Per MD-please schedule pt appt 12/3 with MD 10:30 labs prior at 10am.. pt aware of appt date and time.

## 2012-02-18 ENCOUNTER — Encounter: Payer: Self-pay | Admitting: Oncology

## 2012-02-18 ENCOUNTER — Ambulatory Visit (HOSPITAL_BASED_OUTPATIENT_CLINIC_OR_DEPARTMENT_OTHER): Payer: Medicare Other | Admitting: Oncology

## 2012-02-18 ENCOUNTER — Other Ambulatory Visit (HOSPITAL_BASED_OUTPATIENT_CLINIC_OR_DEPARTMENT_OTHER): Payer: Medicare Other | Admitting: Lab

## 2012-02-18 ENCOUNTER — Telehealth: Payer: Self-pay | Admitting: Oncology

## 2012-02-18 VITALS — BP 131/74 | HR 86 | Temp 98.4°F | Resp 20 | Ht 67.5 in | Wt 215.9 lb

## 2012-02-18 DIAGNOSIS — C3490 Malignant neoplasm of unspecified part of unspecified bronchus or lung: Secondary | ICD-10-CM

## 2012-02-18 DIAGNOSIS — L0390 Cellulitis, unspecified: Secondary | ICD-10-CM

## 2012-02-18 DIAGNOSIS — C3410 Malignant neoplasm of upper lobe, unspecified bronchus or lung: Secondary | ICD-10-CM

## 2012-02-18 DIAGNOSIS — D72829 Elevated white blood cell count, unspecified: Secondary | ICD-10-CM

## 2012-02-18 DIAGNOSIS — L02419 Cutaneous abscess of limb, unspecified: Secondary | ICD-10-CM

## 2012-02-18 DIAGNOSIS — D473 Essential (hemorrhagic) thrombocythemia: Secondary | ICD-10-CM

## 2012-02-18 LAB — CBC WITH DIFFERENTIAL/PLATELET
BASO%: 0.5 % (ref 0.0–2.0)
Basophils Absolute: 0.1 10*3/uL (ref 0.0–0.1)
Eosinophils Absolute: 0.1 10*3/uL (ref 0.0–0.5)
HCT: 36.1 % — ABNORMAL LOW (ref 38.4–49.9)
HGB: 12.1 g/dL — ABNORMAL LOW (ref 13.0–17.1)
MONO#: 1.2 10*3/uL — ABNORMAL HIGH (ref 0.1–0.9)
NEUT#: 9.2 10*3/uL — ABNORMAL HIGH (ref 1.5–6.5)
NEUT%: 74.4 % (ref 39.0–75.0)
WBC: 12.4 10*3/uL — ABNORMAL HIGH (ref 4.0–10.3)
lymph#: 1.8 10*3/uL (ref 0.9–3.3)

## 2012-02-18 NOTE — Progress Notes (Signed)
OFFICE PROGRESS NOTE  CC  [name] Barre, MD [address] 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  PRIOR THERAPY:  #1 patient underwent a right upper lobe resection for a stage II squamous cell carcinoma. He subsequently received 4 cycles of adjuvant chemotherapy consisting of Taxol and carboplatinum. All of his therapy was completed 02/11/2005.  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  CURRENT THERAPY: Observation  INTERVAL HISTORY: [name] Stevenson Sr. 78 y.o. male returns for followup visit per his wife's request. He was seen a few weeks ago. We did a CT of the chest that showed no evidence of recurrent lung cancer. However the wife called [place] yesterday saying that he now had right lower extremity knee cellulitis. He's been seeing his primary care physician's has had several courses of antibiotics. But she still remains very concerned that things are being missed. So he was seen today. Overall patient is feeling well his knee exam looks unremarkable he has no fevers his white count has come down very nicely. MEDICAL HISTORY: Past Medical History  Diagnosis Date  . Stricture and stenosis of esophagus 12/07/2004    EGD done on 12/07/2004  . GERD (gastroesophageal reflux disease)   . Hypoxemia   . Increased prostate specific antigen (PSA) velocity 02/26/2011  . DM (diabetes mellitus) 02/26/2011  . Gout 02/26/2011  . COPD (chronic obstructive pulmonary disease)     oxygen at home, 3L 24/7  . OSA (obstructive sleep apnea) 02/26/2011    CPAP  . Atrial fibrillation   . Depression   . Cardiac arrest 2009    while in hospital   . Coronary artery disease     non obstructive cath 2006  . Hypertension   . HTN (hypertension) 02/26/2011  . Pacemaker   . Cardiac arrest 2009    while in hospital  .  Cancer     lung s/p lobectomy x2 on RT lung  . Basal cell carcinoma of nose 02/26/2011  . Oxygen dependent   . ETOH abuse 01/29/2012    started drinking again - hx cardiac arrest in 2009 due to dt's with organ failure    ALLERGIES:   has no known allergies.  MEDICATIONS:  Current Outpatient Prescriptions  Medication Sig Dispense Refill  . albuterol (PROVENTIL HFA;VENTOLIN HFA) 108 (90 BASE) MCG/ACT inhaler Inhale 2 puffs into the lungs every 6 (six) hours as needed for wheezing.  1 Inhaler  6  . ALPRAZolam (XANAX) 0.5 MG tablet Take 0.5 mg by mouth 3 (three) times daily as needed. For anxiety/sleeplessness.      Marland Kitchen aspirin 81 MG tablet Take 81 mg by mouth daily.        Marland Kitchen atorvastatin (LIPITOR) 40 MG tablet Take 1 tablet (40 mg total) by mouth daily.  90 tablet  3  . budesonide-formoterol (SYMBICORT) 160-4.5 MCG/ACT inhaler Inhale 2 puffs into the lungs 2 (two) times daily.  2 Inhaler  0  . citalopram (CELEXA) 10 MG tablet Take 1 tablet (10 mg total) by mouth daily.  90 tablet  3  . colchicine 0.6 MG tablet Take 0.6 mg by mouth daily as needed. For gout flares      . diltiazem (TIAZAC) 240 MG 24 hr capsule Take 1 capsule (240 mg total) by mouth daily.  90 capsule  3  . esomeprazole (NEXIUM) 40 MG  capsule Take 40 mg by mouth daily as needed. For acid reflux      . fish oil-omega-3 fatty acids 1000 MG capsule Take 2 g by mouth daily.        . furosemide (LASIX) 40 MG tablet Take 40-80 mg by mouth 2 (two) times daily. Take two tabs (80mg ) in the am and one tab (40mg ) in the afternoon for one week, then back to 40 mg BID.      Marland Kitchen hydrALAZINE (APRESOLINE) 25 MG tablet Take 25 mg by mouth 2 (two) times daily.      Marland Kitchen HYDROcodone-acetaminophen (NORCO/VICODIN) 5-325 MG per tablet Take 1-2 tablets by mouth every 4 (four) hours as needed.  30 tablet  0  . hydroxypropyl methylcellulose (ISOPTO TEARS) 2.5 % ophthalmic solution Place 2 drops into both eyes as needed. For dry/irritated eyes.      .  Magnesium Oxide (DIASENSE MAGNESIUM) 400 (241.3 MG) MG TABS Take 1 tablet by mouth daily.       . Multiple Vitamins-Minerals (EYE VITAMINS) TABS Take 1 tablet by mouth 2 (two) times daily.        . multivitamin (THERAGRAN) per tablet Take 1 tablet by mouth daily.        . potassium chloride SA (K-DUR,KLOR-CON) 20 MEQ tablet Take 20 mEq by mouth daily.      . temazepam (RESTORIL) 30 MG capsule Take 1 capsule (30 mg total) by mouth at bedtime.  30 capsule  2  . tiotropium (SPIRIVA) 18 MCG inhalation capsule Place 1 capsule (18 mcg total) into inhaler and inhale daily.  20 capsule  0  . vitamin B-12 (CYANOCOBALAMIN) 100 MCG tablet Take 50 mcg by mouth daily.        . vitamin E 600 UNIT capsule Take 600 Units by mouth 2 (two) times daily.        . metoprolol (LOPRESSOR) 50 MG tablet Take 1 tablet (50 mg total) by mouth 2 (two) times daily.  180 tablet  1    SURGICAL HISTORY:  Past Surgical History  Procedure Date  . Lung lobectomy     x2 lobes on RT lung  . Knee arthroscopy     bilateral  . Pace maker   . Tonsillectomy   . Hernia repair     x2, esophageal  . Cardiac catheterization   . Esophagogastroduodenoscopy 10/25/2011    Procedure: ESOPHAGOGASTRODUODENOSCOPY (EGD);  Surgeon: Hart Carwin, MD;  Location: [facility];  Service: Endoscopy;  Laterality: N/A;    REVIEW OF SYSTEMS:  Pertinent items are noted in HPI.   HEALTH MAINTENANCE:  PHYSICAL EXAMINATION: Blood pressure 131/74, pulse 86, temperature 98.4 F (36.9 C), resp. rate 20, height 5' 7.5" (1.715 m), weight 215 lb 14.4 oz (97.932 kg). Body mass index is 33.32 kg/(m^2). ECOG PERFORMANCE STATUS: 2 - Symptomatic, <50% confined to bed   General appearance: alert, cooperative, fatigued and moderately obese Resp: Distant breath sounds no rales or rhonchi Back: negative, symmetric, no curvature. ROM normal. No CVA tenderness. Cardio: regular rate and rhythm GI: Abdomen is obese bowel sounds are present no  hepatomegaly Extremities: extremities normal, atraumatic, no cyanosis or edema Neurologic: Grossly normal   LABORATORY DATA: Lab Results  Component Value Date   WBC 12.4* 02/18/2012   HGB 12.1* 02/18/2012   HCT 36.1* 02/18/2012   MCV 90.9 02/18/2012   PLT 364 02/18/2012      Chemistry      Component Value Date/Time   NA 136 02/02/2012 0540  NA 136 01/20/2012 1413   NA 138 10/26/2010 1508   K 3.8 02/02/2012 0540   K 3.8 01/20/2012 1413   K 4.4 10/26/2010 1508   CL 99 02/02/2012 0540   CL 102 01/20/2012 1413   CL 95* 10/26/2010 1508   CO2 29 02/02/2012 0540   CO2 25 01/20/2012 1413   CO2 29 10/26/2010 1508   BUN 29* 02/02/2012 0540   BUN 31.0* 01/20/2012 1413   BUN 23* 10/26/2010 1508   CREATININE 1.59* 02/02/2012 0540   CREATININE 1.9* 01/20/2012 1413   CREATININE 1.3* 10/26/2010 1508      Component Value Date/Time   CALCIUM 8.7 02/02/2012 0540   CALCIUM 8.9 01/20/2012 1413   CALCIUM 8.7 10/26/2010 1508   ALKPHOS 88 01/30/2012 0420   ALKPHOS 96 01/20/2012 1413   ALKPHOS 97* 10/26/2010 1508   AST 19 01/30/2012 0420   AST 22 01/20/2012 1413   AST 25 10/26/2010 1508   ALT 19 01/30/2012 0420   ALT 23 01/20/2012 1413   BILITOT 0.3 01/30/2012 0420   BILITOT 0.47 01/20/2012 1413   BILITOT 0.60 10/26/2010 1508       RADIOGRAPHIC STUDIES:  Dg Chest 2 View  01/29/2012  *RADIOLOGY REPORT*  Clinical Data: Weakness and shortness of breath.  Chest pain.  CHEST - 2 VIEW  Comparison: 03/20/2011.  Findings: Cardiac enlargement is stable.  There is interval increase in a diffuse interstitial pattern compatible with edema. Bilateral pleural effusions are now present.  The left hemidiaphragm is markedly elevated.  Atherosclerotic calcifications are noted.  A left subclavian Port-A-Cath is stable.  IMPRESSION:  1.  Cardiomegaly with progressive interstitial edema and effusions compatible with congestive heart failure. 2.  Significant volume loss with elevation of the left hemidiaphragm.   Original  Report Authenticated By: Marin Roberts, [name]     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 Patient is becoming more and more short of breath wife is concerned about recurrence. She wants to be reassured. Therefore she wants to have a CT of the chest performed. He did recently have a chest x-ray done as well.  #2 thrombocytosis and leukocytosis.  #3 cellulitis of the right knee   PLAN:  #1 cellulitis is improved he should not receive any further antibiotics.  #2 wife is reassured again that her husband is doing well.  #3 he'll be seen back in 6 months time  All questions were answered. The patient knows to call the clinic with any problems, questions or concerns. We can certainly see the patient much sooner if necessary.  I spent15 minutes counseling the patient face to face. The total time spent in the appointment was 30 minutes.    Drue Second, MD Medical/Oncology [facility] [phone] (beeper) [phone] (Office)  02/18/2012, 11:17 AM

## 2012-02-18 NOTE — Patient Instructions (Addendum)
Doing well, cellulitis resolved  We will see you back in June 2014

## 2012-02-18 NOTE — Telephone Encounter (Signed)
gve the pt's dtr the June 2014 appt calendar.

## 2012-02-19 ENCOUNTER — Ambulatory Visit: Payer: Medicare Other | Admitting: Oncology

## 2012-02-27 ENCOUNTER — Telehealth: Payer: Self-pay | Admitting: Emergency Medicine

## 2012-02-27 ENCOUNTER — Other Ambulatory Visit (INDEPENDENT_AMBULATORY_CARE_PROVIDER_SITE_OTHER): Payer: Medicare Other

## 2012-02-27 ENCOUNTER — Ambulatory Visit (INDEPENDENT_AMBULATORY_CARE_PROVIDER_SITE_OTHER): Payer: Medicare Other | Admitting: Internal Medicine

## 2012-02-27 ENCOUNTER — Encounter: Payer: Self-pay | Admitting: Internal Medicine

## 2012-02-27 VITALS — BP 130/70 | HR 67 | Temp 98.0°F | Ht 67.0 in | Wt 217.0 lb

## 2012-02-27 DIAGNOSIS — M109 Gout, unspecified: Secondary | ICD-10-CM

## 2012-02-27 DIAGNOSIS — L0291 Cutaneous abscess, unspecified: Secondary | ICD-10-CM

## 2012-02-27 DIAGNOSIS — Z0000 Encounter for general adult medical examination without abnormal findings: Secondary | ICD-10-CM

## 2012-02-27 DIAGNOSIS — L0390 Cellulitis, unspecified: Secondary | ICD-10-CM

## 2012-02-27 DIAGNOSIS — E119 Type 2 diabetes mellitus without complications: Secondary | ICD-10-CM

## 2012-02-27 DIAGNOSIS — I10 Essential (primary) hypertension: Secondary | ICD-10-CM

## 2012-02-27 DIAGNOSIS — E7800 Pure hypercholesterolemia, unspecified: Secondary | ICD-10-CM

## 2012-02-27 LAB — BASIC METABOLIC PANEL
BUN: 25 mg/dL — ABNORMAL HIGH (ref 6–23)
CO2: 29 mEq/L (ref 19–32)
Chloride: 98 mEq/L (ref 96–112)
Creatinine, Ser: 2 mg/dL — ABNORMAL HIGH (ref 0.4–1.5)

## 2012-02-27 LAB — CBC WITH DIFFERENTIAL/PLATELET
Eosinophils Relative: 0.8 % (ref 0.0–5.0)
HCT: 39.7 % (ref 39.0–52.0)
Lymphs Abs: 2.5 10*3/uL (ref 0.7–4.0)
MCV: 90.7 fl (ref 78.0–100.0)
Monocytes Absolute: 1.4 10*3/uL — ABNORMAL HIGH (ref 0.1–1.0)
Platelets: 436 10*3/uL — ABNORMAL HIGH (ref 150.0–400.0)
WBC: 12.9 10*3/uL — ABNORMAL HIGH (ref 4.5–10.5)

## 2012-02-27 MED ORDER — FUROSEMIDE 40 MG PO TABS: ORAL_TABLET | ORAL | Status: DC

## 2012-02-27 MED ORDER — COLCHICINE 0.6 MG PO TABS: 0.6000 mg | ORAL_TABLET | Freq: Every day | ORAL | Status: DC | PRN

## 2012-02-27 MED ORDER — HYDROCODONE-ACETAMINOPHEN 7.5-325 MG PO TABS: 1.0000 | ORAL_TABLET | Freq: Four times a day (QID) | ORAL | Status: DC | PRN

## 2012-02-27 MED ORDER — ALBUTEROL SULFATE HFA 108 (90 BASE) MCG/ACT IN AERS: 2.0000 | INHALATION_SPRAY | Freq: Four times a day (QID) | RESPIRATORY_TRACT | Status: AC | PRN

## 2012-02-27 MED ORDER — TIOTROPIUM BROMIDE MONOHYDRATE 18 MCG IN CAPS: 18.0000 ug | ORAL_CAPSULE | Freq: Every day | RESPIRATORY_TRACT | Status: DC

## 2012-02-27 MED ORDER — CITALOPRAM HYDROBROMIDE 10 MG PO TABS: 10.0000 mg | ORAL_TABLET | Freq: Every day | ORAL | Status: DC

## 2012-02-27 MED ORDER — TEMAZEPAM 30 MG PO CAPS: 30.0000 mg | ORAL_CAPSULE | Freq: Every day | ORAL | Status: AC

## 2012-02-27 MED ORDER — MAGNESIUM OXIDE 400 (241.3 MG) MG PO TABS: 1.0000 | ORAL_TABLET | Freq: Every day | ORAL | Status: AC

## 2012-02-27 MED ORDER — ALPRAZOLAM 0.5 MG PO TABS: 0.5000 mg | ORAL_TABLET | Freq: Three times a day (TID) | ORAL | Status: DC | PRN

## 2012-02-27 MED ORDER — ATORVASTATIN CALCIUM 40 MG PO TABS: 40.0000 mg | ORAL_TABLET | Freq: Every day | ORAL | Status: DC

## 2012-02-27 MED ORDER — POTASSIUM CHLORIDE CRYS ER 20 MEQ PO TBCR: 20.0000 meq | EXTENDED_RELEASE_TABLET | Freq: Every day | ORAL | Status: AC

## 2012-02-27 MED ORDER — ESOMEPRAZOLE MAGNESIUM 40 MG PO CPDR: 40.0000 mg | DELAYED_RELEASE_CAPSULE | Freq: Every day | ORAL | Status: DC | PRN

## 2012-02-27 MED ORDER — BUDESONIDE-FORMOTEROL FUMARATE 160-4.5 MCG/ACT IN AERO: 2.0000 | INHALATION_SPRAY | Freq: Two times a day (BID) | RESPIRATORY_TRACT | Status: DC

## 2012-02-27 MED ORDER — TIOTROPIUM BROMIDE MONOHYDRATE 18 MCG IN CAPS: 18.0000 ug | ORAL_CAPSULE | Freq: Every day | RESPIRATORY_TRACT | Status: AC

## 2012-02-27 MED ORDER — DILTIAZEM HCL ER BEADS 240 MG PO CP24: 240.0000 mg | ORAL_CAPSULE | Freq: Every day | ORAL | Status: DC

## 2012-02-27 MED ORDER — METOPROLOL TARTRATE 50 MG PO TABS: 50.0000 mg | ORAL_TABLET | Freq: Two times a day (BID) | ORAL | Status: DC

## 2012-02-27 MED ORDER — HYDRALAZINE HCL 25 MG PO TABS: 25.0000 mg | ORAL_TABLET | Freq: Two times a day (BID) | ORAL | Status: DC

## 2012-02-27 NOTE — Assessment & Plan Note (Signed)
Resolved,  to f/u any worsening symptoms or concerns, for uric acid level

## 2012-02-27 NOTE — Assessment & Plan Note (Signed)
stable overall by hx and exam, most recent data reviewed with pt, and pt to continue medical treatment as before Lab Results  Component Value Date   HGBA1C 6.1* 01/29/2012   For bmet today

## 2012-02-27 NOTE — Patient Instructions (Addendum)
Continue all other medications as before, except the pain medication is changed as prescribed Your refills were all done today as requested Please keep your appointments with your specialists as you have planned Please go to LAB in the Basement for the blood and/or urine tests to be done today You will be contacted by phone if any changes need to be made immediately.  Otherwise, you will receive a letter about your results with an explanation Please remember to sign up for My Chart at your earliest convenience, as this will be important to you in the future with finding out test results. Please return in 6 mo with Lab testing done 3-5 days before

## 2012-02-27 NOTE — Assessment & Plan Note (Signed)
stable overall by hx and exam, most recent data reviewed with pt, and pt to continue medical treatment as before BP Readings from Last 3 Encounters:  02/27/12 130/70  02/18/12 131/74  02/07/12 110/78

## 2012-02-27 NOTE — Progress Notes (Signed)
Subjective:    Patient ID: [name] Newcomer Sr., male    DOB: 1932/04/07, 78 y.o.   MRN: [id number]  HPI  Here to f/u after recent episode right knee/leg cellulitis and knee swelling  - no septic arthritis per ortho, tx with IV antibx, and predpack for ? Gout.  Had some fluid overload tx as CHF, with lasix increased to 80 AM, 40 PM, other meds no change.  Wears compression stockings bilat, low salt diet,does have occasional beer.  Pt denies fever, wt loss, night sweats, loss of appetite, or other constitutional symptoms  Wt nmow at 217, last wt here 222 in June 2013.  Home scales/wts seem less to wife though not sure of the numbers.  Pt denies chest pain, increased sob or doe, wheezing, orthopnea, PND, increased LE swelling, palpitations, dizziness or syncope.  Pt denies new neurological symptoms such as new headache, or facial or extremity weakness or numbness   Pt denies polydipsia, polyuria, or low sugar symptoms such as weakness or confusion improved with po intake.  Pt states overall good compliance with meds, trying to follow lower cholesterol, diabetic diet, wt overall stable but little exercise however.    Needs labs for sugar and rx for supplies today Past Medical History  Diagnosis Date  . Stricture and stenosis of esophagus 12/07/2004    EGD done on 12/07/2004  . GERD (gastroesophageal reflux disease)   . Hypoxemia   . Increased prostate specific antigen (PSA) velocity 02/26/2011  . DM (diabetes mellitus) 02/26/2011  . Gout 02/26/2011  . COPD (chronic obstructive pulmonary disease)     oxygen at home, 3L 24/7  . OSA (obstructive sleep apnea) 02/26/2011    CPAP  . Atrial fibrillation   . Depression   . Cardiac arrest 2009    while in hospital   . Coronary artery disease     non obstructive cath 2006  . Hypertension   . HTN (hypertension) 02/26/2011  . Pacemaker   . Cardiac arrest 2009    while in hospital  . Cancer     lung s/p lobectomy x2 on RT lung  . Basal cell carcinoma of  nose 02/26/2011  . Oxygen dependent   . ETOH abuse 01/29/2012    started drinking again - hx cardiac arrest in 2009 due to dt's with organ failure   Past Surgical History  Procedure Date  . Lung lobectomy     x2 lobes on RT lung  . Knee arthroscopy     bilateral  . Pace maker   . Tonsillectomy   . Hernia repair     x2, esophageal  . Cardiac catheterization   . Esophagogastroduodenoscopy 10/25/2011    Procedure: ESOPHAGOGASTRODUODENOSCOPY (EGD);  Surgeon: Hart Carwin, MD;  Location: [facility];  Service: Endoscopy;  Laterality: N/A;    reports that he quit smoking about 15 years ago. His smoking use included Cigarettes. He has a 75 pack-year smoking history. His smokeless tobacco use includes Chew. He reports that he drinks alcohol. He reports that he does not use illicit drugs. family history includes Cancer in his other; Heart disease in his father and other; Hypertension in his other; and Lung cancer in his mother. No Known Allergies Current Outpatient Prescriptions on File Prior to Visit  Medication Sig Dispense Refill  . albuterol (PROVENTIL HFA;VENTOLIN HFA) 108 (90 BASE) MCG/ACT inhaler Inhale 2 puffs into the lungs every 6 (six) hours as needed for wheezing.  1 Inhaler  6  . ALPRAZolam (XANAX) 0.5  MG tablet Take 0.5 mg by mouth 3 (three) times daily as needed. For anxiety/sleeplessness.      Marland Kitchen aspirin 81 MG tablet Take 81 mg by mouth daily.        Marland Kitchen atorvastatin (LIPITOR) 40 MG tablet Take 1 tablet (40 mg total) by mouth daily.  90 tablet  3  . budesonide-formoterol (SYMBICORT) 160-4.5 MCG/ACT inhaler Inhale 2 puffs into the lungs 2 (two) times daily.  2 Inhaler  0  . citalopram (CELEXA) 10 MG tablet Take 1 tablet (10 mg total) by mouth daily.  90 tablet  3  . colchicine 0.6 MG tablet Take 0.6 mg by mouth daily as needed. For gout flares      . diltiazem (TIAZAC) 240 MG 24 hr capsule Take 1 capsule (240 mg total) by mouth daily.  90 capsule  3  . esomeprazole (NEXIUM) 40 MG  capsule Take 40 mg by mouth daily as needed. For acid reflux      . fish oil-omega-3 fatty acids 1000 MG capsule Take 2 g by mouth daily.        . furosemide (LASIX) 40 MG tablet Take 40-80 mg by mouth 2 (two) times daily. Take two tabs (80mg ) in the am and one tab (40mg ) in the afternoon for one week, then back to 40 mg BID.      Marland Kitchen hydrALAZINE (APRESOLINE) 25 MG tablet Take 25 mg by mouth 2 (two) times daily.      Marland Kitchen HYDROcodone-acetaminophen (NORCO/VICODIN) 5-325 MG per tablet Take 1-2 tablets by mouth every 4 (four) hours as needed.  30 tablet  0  . hydroxypropyl methylcellulose (ISOPTO TEARS) 2.5 % ophthalmic solution Place 2 drops into both eyes as needed. For dry/irritated eyes.      . Magnesium Oxide (DIASENSE MAGNESIUM) 400 (241.3 MG) MG TABS Take 1 tablet by mouth daily.       . metoprolol (LOPRESSOR) 50 MG tablet Take 1 tablet (50 mg total) by mouth 2 (two) times daily.  180 tablet  1  . Multiple Vitamins-Minerals (EYE VITAMINS) TABS Take 1 tablet by mouth 2 (two) times daily.        . multivitamin (THERAGRAN) per tablet Take 1 tablet by mouth daily.        . potassium chloride SA (K-DUR,KLOR-CON) 20 MEQ tablet Take 20 mEq by mouth daily.      . temazepam (RESTORIL) 30 MG capsule Take 1 capsule (30 mg total) by mouth at bedtime.  30 capsule  2  . vitamin B-12 (CYANOCOBALAMIN) 100 MCG tablet Take 50 mcg by mouth daily.        . vitamin E 600 UNIT capsule Take 600 Units by mouth 2 (two) times daily.        . [DISCONTINUED] tiotropium (SPIRIVA) 18 MCG inhalation capsule Place 1 capsule (18 mcg total) into inhaler and inhale daily.  20 capsule  0   Review of Systems  Constitutional: Negative for diaphoresis and unexpected weight change.  HENT: Negative for tinnitus.   Eyes: Negative for photophobia and visual disturbance.  Respiratory: Negative for choking and stridor.   Gastrointestinal: Negative for vomiting and blood in stool.  Genitourinary: Negative for hematuria and decreased urine  volume.  Musculoskeletal: Negative for gait problem.  Skin: Negative for color change and wound.  Neurological: Negative for tremors and numbness.  Psychiatric/Behavioral: Negative for decreased concentration. The patient is not hyperactive.       Objective:   Physical Exam BP 130/70  Pulse 67  Temp  98 F (36.7 C) (Oral)  Ht 5\' 7"  (1.702 m)  Wt 217 lb (98.431 kg)  BMI 33.99 kg/m2  SpO2 97% Physical Exam  VS noted, not ill appearing Constitutional: Pt appears well-developed and well-nourished.  HENT: Head: Normocephalic.  Right Ear: External ear normal.  Left Ear: External ear normal.  Eyes: Conjunctivae and EOM are normal. Pupils are equal, round, and reactive to light.  Neck: Normal range of motion. Neck supple.  Cardiovascular: Normal rate and irregular rhythm.   Pulmonary/Chest: Effort normal and breath sounds decreased bilat, no rales or wheezing.  Abd:  Soft, NT, non-distended, + BS Neurological: Pt is alert. Not confused  Skin: Skin is warm. No erythema. No LE edema Psychiatric: Pt behavior is normal. Thought content normal.  Right knee NT, no effusion, walks ok    Assessment & Plan:

## 2012-02-27 NOTE — Telephone Encounter (Signed)
Samples at front. Carron Curie, CMA

## 2012-02-27 NOTE — Assessment & Plan Note (Signed)
Resolved,  to f/u any worsening symptoms or concerns  

## 2012-02-28 ENCOUNTER — Encounter: Payer: Self-pay | Admitting: Internal Medicine

## 2012-02-28 ENCOUNTER — Other Ambulatory Visit: Payer: Self-pay | Admitting: Internal Medicine

## 2012-02-28 DIAGNOSIS — N189 Chronic kidney disease, unspecified: Secondary | ICD-10-CM

## 2012-02-28 DIAGNOSIS — N183 Chronic kidney disease, stage 3 (moderate): Secondary | ICD-10-CM

## 2012-02-28 HISTORY — DX: Chronic kidney disease, unspecified: N18.9

## 2012-02-28 MED ORDER — ALLOPURINOL 100 MG PO TABS: 100.0000 mg | ORAL_TABLET | Freq: Every day | ORAL | Status: DC

## 2012-03-03 ENCOUNTER — Telehealth: Payer: Self-pay | Admitting: Internal Medicine

## 2012-03-03 ENCOUNTER — Telehealth: Payer: Self-pay | Admitting: *Deleted

## 2012-03-03 MED ORDER — CEPHALEXIN 500 MG PO CAPS: 500.0000 mg | ORAL_CAPSULE | Freq: Four times a day (QID) | ORAL | Status: DC

## 2012-03-03 NOTE — Telephone Encounter (Signed)
Very sorry, office closing early today for holiday gathering  I think pt should go to UC or ER due to injury and bite that may at risk for infection

## 2012-03-03 NOTE — Telephone Encounter (Signed)
She should take Brance to the urgent care

## 2012-03-03 NOTE — Telephone Encounter (Signed)
Pt's wife called states " Aulton got scraped up pretty bad, a couple places on his arm by a dog. Can Dr. Welton Flakes call him in an antibiotic. I'm so afraid it will turn into cellulitis like he's had before.Please call me back and let me know" Message forward to MD for review.

## 2012-03-03 NOTE — Telephone Encounter (Signed)
Patient Information:  Caller Name: N/A  Phone: [phone]  Patient: [name], Stevenson  Gender: Male  DOB: [birth year]  Age: 78 Years  PCP: [name] (Adults only)  Office Follow Up:  Does the office need to follow up with this patient?: Yes  Instructions For The Office: Patient prefers to be seen in office due to copay cost; refuses ED   krs/can  RN Note:  Dog claw tore the skin.  Bled for a few minutes.  Cleansed with soap and water.  Unable to find date of last tetanus booster in Epic.  Per protocol, advised ED; patient refuses, due to cost; prefers to bring to office.  Info to office for staff/provider review/callback.  Animal bite/scratch form completed and faxed to [facility].  krs/can  Symptoms  Reason For Call & Symptoms: scratch to skin 03/02/12 by dog.  Scrape to the skin noted.  Patient is a cancer patient and gets secondary infections easily.  Scratch is on right forearm between wrist and elbow.  Reviewed Health History In EMR: Yes  Reviewed Medications In EMR: Yes  Reviewed Allergies In EMR: Yes  Reviewed Surgeries / Procedures: Yes  Date of Onset of Symptoms: 03/02/2012  Guideline(s) Used:  Lobbyist  Disposition Per Guideline:   Go to ED Now (or to Office with PCP Approval)  Reason For Disposition Reached:   Cut (length > 1/8 inch or 3 mm) or skin tear and any animal  Advice Given:  N/A  Patient Refused Recommendation:  Patient Will Make Own Appointment  Patient prefers to be seen in office due to copay cost; refuses ED   krs/can

## 2012-03-03 NOTE — Telephone Encounter (Signed)
Per MD, notified [name] Stevenson to take pt to Urgent Care. [name] Witter verbalized she will take pt to Urgent Care, expressed concerns and verbalized she just worries and has been for 10 years. No further questions.

## 2012-03-03 NOTE — Telephone Encounter (Signed)
If no bite, can do cephalexin course

## 2012-03-03 NOTE — Telephone Encounter (Signed)
They cannot go to the ER as the patients wife is sick and the ER is $65. They would like an antibiotic sent ot  pharmacy

## 2012-03-03 NOTE — Telephone Encounter (Signed)
Patient informed. 

## 2012-03-04 ENCOUNTER — Encounter (HOSPITAL_COMMUNITY): Payer: Self-pay

## 2012-03-04 ENCOUNTER — Emergency Department (HOSPITAL_COMMUNITY)
Admission: EM | Admit: 2012-03-04 | Discharge: 2012-03-04 | Disposition: A | Discharge status: Home / Self Care | Payer: Medicare Other | Attending: Emergency Medicine | Admitting: Emergency Medicine

## 2012-03-04 DIAGNOSIS — I129 Hypertensive chronic kidney disease with stage 1 through stage 4 chronic kidney disease, or unspecified chronic kidney disease: Secondary | ICD-10-CM | POA: Insufficient documentation

## 2012-03-04 DIAGNOSIS — N189 Chronic kidney disease, unspecified: Secondary | ICD-10-CM | POA: Insufficient documentation

## 2012-03-04 DIAGNOSIS — M109 Gout, unspecified: Secondary | ICD-10-CM | POA: Insufficient documentation

## 2012-03-04 DIAGNOSIS — Z8679 Personal history of other diseases of the circulatory system: Secondary | ICD-10-CM | POA: Insufficient documentation

## 2012-03-04 DIAGNOSIS — F1010 Alcohol abuse, uncomplicated: Secondary | ICD-10-CM | POA: Insufficient documentation

## 2012-03-04 DIAGNOSIS — I2510 Atherosclerotic heart disease of native coronary artery without angina pectoris: Secondary | ICD-10-CM | POA: Insufficient documentation

## 2012-03-04 DIAGNOSIS — F329 Major depressive disorder, single episode, unspecified: Secondary | ICD-10-CM | POA: Insufficient documentation

## 2012-03-04 DIAGNOSIS — Z7982 Long term (current) use of aspirin: Secondary | ICD-10-CM | POA: Insufficient documentation

## 2012-03-04 DIAGNOSIS — F3289 Other specified depressive episodes: Secondary | ICD-10-CM | POA: Insufficient documentation

## 2012-03-04 DIAGNOSIS — Z951 Presence of aortocoronary bypass graft: Secondary | ICD-10-CM | POA: Insufficient documentation

## 2012-03-04 DIAGNOSIS — J449 Chronic obstructive pulmonary disease, unspecified: Secondary | ICD-10-CM | POA: Insufficient documentation

## 2012-03-04 DIAGNOSIS — Z79899 Other long term (current) drug therapy: Secondary | ICD-10-CM | POA: Insufficient documentation

## 2012-03-04 DIAGNOSIS — L0390 Cellulitis, unspecified: Secondary | ICD-10-CM

## 2012-03-04 DIAGNOSIS — K219 Gastro-esophageal reflux disease without esophagitis: Secondary | ICD-10-CM | POA: Insufficient documentation

## 2012-03-04 DIAGNOSIS — Y939 Activity, unspecified: Secondary | ICD-10-CM | POA: Insufficient documentation

## 2012-03-04 DIAGNOSIS — W64XXXA Exposure to other animate mechanical forces, initial encounter: Secondary | ICD-10-CM | POA: Insufficient documentation

## 2012-03-04 DIAGNOSIS — E119 Type 2 diabetes mellitus without complications: Secondary | ICD-10-CM | POA: Insufficient documentation

## 2012-03-04 DIAGNOSIS — J4489 Other specified chronic obstructive pulmonary disease: Secondary | ICD-10-CM | POA: Insufficient documentation

## 2012-03-04 DIAGNOSIS — IMO0002 Reserved for concepts with insufficient information to code with codable children: Secondary | ICD-10-CM | POA: Insufficient documentation

## 2012-03-04 DIAGNOSIS — Y929 Unspecified place or not applicable: Secondary | ICD-10-CM | POA: Insufficient documentation

## 2012-03-04 DIAGNOSIS — Z85118 Personal history of other malignant neoplasm of bronchus and lung: Secondary | ICD-10-CM | POA: Insufficient documentation

## 2012-03-04 DIAGNOSIS — Z8719 Personal history of other diseases of the digestive system: Secondary | ICD-10-CM | POA: Insufficient documentation

## 2012-03-04 DIAGNOSIS — I10 Essential (primary) hypertension: Secondary | ICD-10-CM

## 2012-03-04 LAB — CBC WITH DIFFERENTIAL/PLATELET
Basophils Relative: 0 % (ref 0–1)
Eosinophils Absolute: 0 10*3/uL (ref 0.0–0.7)
MCH: 29 pg (ref 26.0–34.0)
MCHC: 32.8 g/dL (ref 30.0–36.0)
Neutrophils Relative %: 63 % (ref 43–77)
Platelets: 437 10*3/uL — ABNORMAL HIGH (ref 150–400)
RBC: 4.1 MIL/uL — ABNORMAL LOW (ref 4.22–5.81)
RDW: 16.1 % — ABNORMAL HIGH (ref 11.5–15.5)

## 2012-03-04 LAB — BASIC METABOLIC PANEL
BUN: 28 mg/dL — ABNORMAL HIGH (ref 6–23)
Calcium: 8.8 mg/dL (ref 8.4–10.5)
GFR calc non Af Amer: 35 mL/min — ABNORMAL LOW (ref >90)
Glucose, Bld: 113 mg/dL — ABNORMAL HIGH (ref 70–99)
Sodium: 141 mEq/L (ref 135–145)

## 2012-03-04 MED ORDER — SODIUM CHLORIDE 0.9 % IV SOLN
Freq: Once | INTRAVENOUS | Status: AC
  Administered 2012-03-04: 20:00:00 via INTRAVENOUS

## 2012-03-04 MED ORDER — HEPARIN SOD (PORK) LOCK FLUSH 100 UNIT/ML IV SOLN
INTRAVENOUS | Status: AC
  Filled 2012-03-04: qty 5

## 2012-03-04 MED ORDER — BACITRACIN ZINC 500 UNIT/GM EX OINT
TOPICAL_OINTMENT | CUTANEOUS | Status: AC
  Filled 2012-03-04: qty 1.8

## 2012-03-04 MED ORDER — AMOXICILLIN-POT CLAVULANATE 875-125 MG PO TABS: 1.0000 | ORAL_TABLET | Freq: Two times a day (BID) | ORAL | Status: DC

## 2012-03-04 MED ORDER — AMPICILLIN-SULBACTAM SODIUM 3 (2-1) G IJ SOLR
3.0000 g | Freq: Once | INTRAMUSCULAR | Status: AC
  Administered 2012-03-04: 3 g via INTRAVENOUS
  Filled 2012-03-04: qty 3

## 2012-03-04 NOTE — ED Provider Notes (Signed)
History     CSN: [id number]  Arrival date & time 03/04/12  1350   First MD Initiated Contact with Patient 03/04/12 1559      Chief Complaint  Patient presents with  . Laceration    (Consider location/radiation/quality/duration/timing/severity/associated sxs/prior treatment) Patient is a 78 y.o. male presenting with skin laceration. The history is provided by the patient, medical records and a relative.  Laceration     [name] Newcomer Sr. is a 78 y.o. male  with a recent Hx of cellulitis, COPD, GERD, a-fib, HTN, CAD, CKD  presents to the Emergency Department complaining of acute, persistent, stabilized laceration onset 48 hours ago.  Patient states he was feeding his daughter treat when the neighbors 180 pound dog approached her and caught his arm, tearing his skin.  He states hemostasis at home.     he states his daughter is recommended that he come since he just finished being hospitalized for cellulitis.  Associated symptoms include persistent bleeding and pain.  Nothing makes it better and nothing makes it worse.  Pt denies fever, chills, headache, neck pain, chest pain, shortness of breath, abdominal pain, nausea, vomiting, diarrhea, weakness, dizziness, syncope.    Past Medical History  Diagnosis Date  . Stricture and stenosis of esophagus 12/07/2004    EGD done on 12/07/2004  . GERD (gastroesophageal reflux disease)   . Hypoxemia   . Increased prostate specific antigen (PSA) velocity 02/26/2011  . DM (diabetes mellitus) 02/26/2011  . Gout 02/26/2011  . COPD (chronic obstructive pulmonary disease)     oxygen at home, 3L 24/7  . OSA (obstructive sleep apnea) 02/26/2011    CPAP  . Atrial fibrillation   . Depression   . Cardiac arrest 2009    while in hospital   . Coronary artery disease     non obstructive cath 2006  . Hypertension   . HTN (hypertension) 02/26/2011  . Pacemaker   . Cardiac arrest 2009    while in hospital  . Cancer     lung s/p lobectomy x2 on RT  lung  . Basal cell carcinoma of nose 02/26/2011  . Oxygen dependent   . ETOH abuse 01/29/2012    started drinking again - hx cardiac arrest in 2009 due to dt's with organ failure  . CKD (chronic kidney disease) 02/28/2012    Past Surgical History  Procedure Date  . Lung lobectomy     x2 lobes on RT lung  . Knee arthroscopy     bilateral  . Pace maker   . Tonsillectomy   . Hernia repair     x2, esophageal  . Cardiac catheterization   . Esophagogastroduodenoscopy 10/25/2011    Procedure: ESOPHAGOGASTRODUODENOSCOPY (EGD);  Surgeon: Hart Carwin, MD;  Location: [facility];  Service: Endoscopy;  Laterality: N/A;    Family History  Problem Relation Age of Onset  . Heart disease Father   . Lung cancer Mother   . Cancer Other     lung cancer  . Heart disease Other   . Hypertension Other     History  Substance Use Topics  . Smoking status: Former Smoker -- 1.5 packs/day for 50 years    Types: Cigarettes    Quit date: 06/28/1996  . Smokeless tobacco: Current User    Types: Chew  . Alcohol Use: Yes     Comment: 2 beers 2-3 times /week      Review of Systems  Constitutional: Negative for fever.  Skin: Positive for wound.  Neurological: Negative for weakness and numbness.  All other systems reviewed and are negative.    Allergies  Review of patient's allergies indicates no known allergies.  Home Medications   Current Outpatient Rx  Name  Route  Sig  Dispense  Refill  . ALBUTEROL SULFATE HFA 108 (90 BASE) MCG/ACT IN AERS   Inhalation   Inhale 2 puffs into the lungs every 6 (six) hours as needed for wheezing.   1 Inhaler   6   . ALLOPURINOL 100 MG PO TABS   Oral   Take 1 tablet (100 mg total) by mouth daily.   90 tablet   3   . ALPRAZOLAM 0.5 MG PO TABS   Oral   Take 1 tablet (0.5 mg total) by mouth 3 (three) times daily as needed. For anxiety/sleeplessness.   90 tablet   5   . ASPIRIN 81 MG PO TABS   Oral   Take 81 mg by mouth daily.           .  ATORVASTATIN CALCIUM 40 MG PO TABS   Oral   Take 40 mg by mouth at bedtime.         . BUDESONIDE-FORMOTEROL FUMARATE 160-4.5 MCG/ACT IN AERO   Inhalation   Inhale 2 puffs into the lungs 2 (two) times daily.   3 Inhaler   3   . CITALOPRAM HYDROBROMIDE 10 MG PO TABS   Oral   Take 1 tablet (10 mg total) by mouth daily.   90 tablet   3   . COLCHICINE 0.6 MG PO TABS   Oral   Take 1 tablet (0.6 mg total) by mouth daily as needed. For gout flares   90 tablet   3   . DILTIAZEM HCL ER BEADS 240 MG PO CP24   Oral   Take 1 capsule (240 mg total) by mouth daily.   90 capsule   3   . ESOMEPRAZOLE MAGNESIUM 40 MG PO CPDR   Oral   Take 1 capsule (40 mg total) by mouth daily as needed. For acid reflux   90 capsule   3   . OMEGA-3 FATTY ACIDS 1000 MG PO CAPS   Oral   Take 2 g by mouth daily.           . FUROSEMIDE 40 MG PO TABS      To take 2 tabs by mouth in the AM, and 1 in the PM   270 tablet   3   . HYDRALAZINE HCL 25 MG PO TABS   Oral   Take 1 tablet (25 mg total) by mouth 2 (two) times daily.   180 tablet   3   . HYDROCODONE-ACETAMINOPHEN 7.5-325 MG PO TABS   Oral   Take 1 tablet by mouth every 6 (six) hours as needed for pain.   120 tablet   5   . HYPROMELLOSE 2.5 % OP SOLN   Both Eyes   Place 2 drops into both eyes as needed. For dry/irritated eyes.         Marland Kitchen MAGNESIUM OXIDE 400 (241.3 MG) MG PO TABS   Oral   Take 1 tablet (400 mg total) by mouth daily.   90 tablet   3   . METOPROLOL TARTRATE 50 MG PO TABS   Oral   Take 1 tablet (50 mg total) by mouth 2 (two) times daily.   180 tablet   3   . EYE VITAMINS PO TABS   Oral  Take 1 tablet by mouth 2 (two) times daily.           . MULTIVITAMINS PO TABS   Oral   Take 1 tablet by mouth daily.           Marland Kitchen POTASSIUM CHLORIDE CRYS ER 20 MEQ PO TBCR   Oral   Take 1 tablet (20 mEq total) by mouth daily.   90 tablet   3   . TEMAZEPAM 30 MG PO CAPS   Oral   Take 1 capsule (30 mg total) by  mouth at bedtime.   90 capsule   1   . TIOTROPIUM BROMIDE MONOHYDRATE 18 MCG IN CAPS   Inhalation   Place 1 capsule (18 mcg total) into inhaler and inhale daily.   90 capsule   3   . VITAMIN B-12 100 MCG PO TABS   Oral   Take 50 mcg by mouth daily.           Marland Kitchen VITAMIN E 600 UNITS PO CAPS   Oral   Take 600 Units by mouth 2 (two) times daily.           . AMOXICILLIN-POT CLAVULANATE 875-125 MG PO TABS   Oral   Take 1 tablet by mouth every 12 (twelve) hours.   14 tablet   0   . CEPHALEXIN 500 MG PO CAPS   Oral   Take 1 capsule (500 mg total) by mouth 4 (four) times daily.   40 capsule   0     BP 176/84  Pulse 70  Temp 98.9 F (37.2 C) (Oral)  Resp 18  SpO2 100%  Physical Exam  Nursing note and vitals reviewed. Constitutional: He appears well-developed and well-nourished. No distress.  HENT:  Head: Normocephalic and atraumatic.  Eyes: Conjunctivae normal are normal. Pupils are equal, round, and reactive to light. No scleral icterus.  Cardiovascular: Normal rate, regular rhythm, normal heart sounds and intact distal pulses.  Exam reveals no gallop and no friction rub.   No murmur heard.      Capillary refill less than 3 seconds  Pulmonary/Chest: Effort normal and breath sounds normal. No respiratory distress. He exhibits no tenderness.       Patient on oxygen at home.  Musculoskeletal: Normal range of motion. He exhibits no edema.       Arms:      Full range of motion of the right hand and wrist without pain  Skin tears to the left hand and 2 on the forearm, hemostasis achieved, mild erythema, no induration or evidence of abscess, no weeping of pus or serous fluid, non-tender to palpation  Neurological: He is alert. He exhibits normal muscle tone. Coordination normal.       Sensation intact to dull and sharp touch Strength 5 out of 5 at the right fingers wrist and elbow  Skin: Skin is warm and dry. He is not diaphoretic.    ED Course  Procedures (including  critical care time)  Labs Reviewed  BASIC METABOLIC PANEL - Abnormal; Notable for the following:    Potassium 3.3 (*)     Glucose, Bld 113 (*)     BUN 28 (*)     Creatinine, Ser 1.80 (*)     GFR calc non Af Amer 35 (*)     GFR calc Af Amer 40 (*)     All other components within normal limits  CBC WITH DIFFERENTIAL - Abnormal; Notable for the following:    WBC 13.0 (*)  RBC 4.10 (*)     Hemoglobin 11.9 (*)     HCT 36.3 (*)     RDW 16.1 (*)     Platelets 437 (*)     Neutro Abs 8.3 (*)     Monocytes Absolute 1.6 (*)     All other components within normal limits   No results found.   1. DM (diabetes mellitus)   2. HTN (hypertension)   3. Cellulitis   4. CKD (chronic kidney disease)       MDM  [name] Newcomer Sr. presents with scratches from a dog and subsequent skin tears.  The patient with questionable cellulitis with erythema and mild warmth at the site of a skin tear.  Patient with history of significant cellulitis to the right knee and at risk with comorbidities of diabetes and skin cancer.  Will give one dose of IV antibiotics here and check labs.  Patient with mild leukocytosis of 13, mild hypokalemia and slightly elevated renal function.  Patient without evidence of sepsis.  Patient has not meet the SIRS criteria.  Nontoxic, nonseptic appearing, in NAD.  Patient reassessed after his antibiotic administration and states he feels great and is ready to go home.  Patient will be discharged with Augmentin and I have recommended that he followup with his primary care physician this week to have the wound reassessed.  I have also discussed reasons to return immediately to the ER.  Patient expresses understanding and agrees with plan.  1. Medications: Augmentin, usual home medications  2. Treatment: rest, drink plenty of fluids, take medication as prescribed, keep wound clean and dry  3. Follow Up: Please followup with your primary doctor for discussion of your diagnoses and  further evaluation after today's visit; if you do not have a primary care doctor use the resource guide provided to find one        Dierdre Forth, PA-C 03/04/12 2108

## 2012-03-04 NOTE — ED Notes (Signed)
Patient reports that he was scratched by his dog to the left forearm. Patient has a laceration and 2 skin tears. Patient has a history of cellulitis and takes an aspirin daily. Bleeding is controlled. Patient called Dr. Ashley Akin and was instructed to come to the ED for further evaluation and treatment.

## 2012-03-04 NOTE — ED Notes (Signed)
IV team paged to access port 

## 2012-03-04 NOTE — ED Notes (Signed)
Pt transferred to acute care area. Charge RN will access porta cath

## 2012-03-05 NOTE — ED Provider Notes (Signed)
Medical screening examination/treatment/procedure(s) were conducted as a shared visit with non-physician practitioner(s) and myself.  I personally evaluated the patient during the encounter [name] Stevenson, [name].  [name] Newcomer Sr. is a 78 y.o. male presenting as a minor lacerations of the left forearm caused by his neighbors 180 pound black lab. These occurred 2 days ago. Patient recently was hospitalized for right lower extremity cellulitis and he and his wife became concerned because the area surrounding these recent scratches from this dog has become reddened and concerning for cellulitis. Patient does also have a pertinent history of chronic renal disease, COPD and is status post lobectomy on the right x2 for lung cancer. He is currently on oxygen at all times. The area is uncomfortable, is mild to moderate, is nonradiating. It has not been bleeding. The redness has been worsening and now goes to the upper part of the left forearm. He denies any fevers, chills, abdominal pain, nausea or vomiting, headaches, chest pain or shortness of breath worse than usual. No other rash or skin problems.  At least 10pt or greater review of systems completed and are negative except where specified in the HPI.   VITAL SIGNS:   Filed Vitals:   03/04/12 1946  BP: 176/84  Pulse: 70  Temp: 98.9 F (37.2 C)  Resp: 18   CONSTITUTIONAL: Awake, oriented, appears non-toxic HENT: Atraumatic, normocephalic, oral mucosa pink and moist, airway patent. Nares patent without drainage. External ears normal. EYES: Conjunctiva clear, EOMI, PERRLA NECK: Trachea midline, non-tender, supple CARDIOVASCULAR: Normal heart rate, Normal rhythm, No murmurs, rubs, gallops PULMONARY/CHEST: Clear to auscultation, no rhonchi, wheezes, or rales. Symmetrical breath sounds. Non-tender. ABDOMINAL: Non-distended, soft, non-tender - no rebound or guarding.  BS normal. NEUROLOGIC: Non-focal, moving all four extremities, no gross sensory or  motor deficits. EXTREMITIES: No clubbing, cyanosis, or edema. Laceration to mid left forearm about 5 cm. Just about 2 skin tears which are hemostatic. Patient has some erythema to both hands bilaterally which is likely secondary to his chronic medical conditions-however the left arm is red up to just distal to the elbow. SKIN: Warm, Dry; erythematous left forearm  ZOX:WRUEAV [name] Santarelli Sr. is a 78 y.o. male presenting with lacerations/abrasions to left forearm with concern for cellulitis. Patient has significant comorbidities-will treat the patient was on IV antibiotics in the emergency department and obtained some basic labs  Labs Reviewed  BASIC METABOLIC PANEL - Abnormal; Notable for the following:    Potassium 3.3 (*)     Glucose, Bld 113 (*)     BUN 28 (*)     Creatinine, Ser 1.80 (*)     GFR calc non Af Amer 35 (*)     GFR calc Af Amer 40 (*)     All other components within normal limits  CBC WITH DIFFERENTIAL - Abnormal; Notable for the following:    WBC 13.0 (*)     RBC 4.10 (*)     Hemoglobin 11.9 (*)     HCT 36.3 (*)     RDW 16.1 (*)     Platelets 437 (*)     Neutro Abs 8.3 (*)     Monocytes Absolute 1.6 (*)     All other components within normal limits  LAB REPORT - SCANNED   Labs are fairly unremarkable, patient received Unasyn. Patient will go home on antibiotics-Augmentin. Patient's vital signs are stable and within normal limits for this patient, there is no septic physiology, he is nontoxic, afebrile, not tachycardic that he  may be safely discharged. Patient and patient's wife can adequately make decisions and judgment about the patient's condition and if it worsens the patient will return immediately to the emergency department.  I explained the diagnosis and have given explicit precautions to return to the ER including fevers, worsening redness or any other new or worsening symptoms. The patient understands and accepts the medical plan as it's been dictated and I have  answered their questions. Discharge instructions concerning home care and prescriptions have been given.  The patient is STABLE and is discharged to home in good condition.     [name] Skene, MD 03/05/12 1723

## 2012-03-16 ENCOUNTER — Telehealth: Payer: Self-pay | Admitting: Internal Medicine

## 2012-03-16 ENCOUNTER — Other Ambulatory Visit: Payer: Self-pay

## 2012-03-16 MED ORDER — ALPRAZOLAM 0.5 MG PO TABS: 0.5000 mg | ORAL_TABLET | Freq: Three times a day (TID) | ORAL | Status: DC | PRN

## 2012-03-16 NOTE — Telephone Encounter (Signed)
Requesting refill on xanex.  Was seen in Dec.

## 2012-03-16 NOTE — Telephone Encounter (Signed)
Faxed hardcopy to pharmacy  And called the patient informed prescription requested has been sent in to [facility] as requested by patient.

## 2012-03-16 NOTE — Telephone Encounter (Signed)
The patient is requesting a refill on xanax (see previous note).  The patients wife stated he never got the hardcopy at his last OV 02/27/12.  Please advise as the patient is in need of this medication as is out.

## 2012-03-16 NOTE — Telephone Encounter (Signed)
done

## 2012-03-16 NOTE — Telephone Encounter (Signed)
Informed the patient prescription was printed and given to him at his OV on 02/27/12 #90 with 5 refills.   Patient stated he would check his paperwork.

## 2012-04-06 ENCOUNTER — Ambulatory Visit (INDEPENDENT_AMBULATORY_CARE_PROVIDER_SITE_OTHER)
Admission: RE | Admit: 2012-04-06 | Discharge: 2012-04-06 | Disposition: A | Discharge status: Home / Self Care | Payer: Medicare Other | Source: Ambulatory Visit | Attending: Internal Medicine | Admitting: Internal Medicine

## 2012-04-06 ENCOUNTER — Ambulatory Visit (INDEPENDENT_AMBULATORY_CARE_PROVIDER_SITE_OTHER): Payer: Medicare Other | Admitting: Internal Medicine

## 2012-04-06 ENCOUNTER — Other Ambulatory Visit (INDEPENDENT_AMBULATORY_CARE_PROVIDER_SITE_OTHER): Payer: Medicare Other

## 2012-04-06 ENCOUNTER — Encounter: Payer: Self-pay | Admitting: Internal Medicine

## 2012-04-06 ENCOUNTER — Telehealth: Payer: Self-pay | Admitting: Internal Medicine

## 2012-04-06 VITALS — BP 162/84 | HR 76 | Temp 99.4°F | Resp 16 | Wt 216.0 lb

## 2012-04-06 DIAGNOSIS — M25579 Pain in unspecified ankle and joints of unspecified foot: Secondary | ICD-10-CM

## 2012-04-06 DIAGNOSIS — M79609 Pain in unspecified limb: Secondary | ICD-10-CM

## 2012-04-06 DIAGNOSIS — M25572 Pain in left ankle and joints of left foot: Secondary | ICD-10-CM

## 2012-04-06 DIAGNOSIS — M79672 Pain in left foot: Secondary | ICD-10-CM

## 2012-04-06 DIAGNOSIS — M109 Gout, unspecified: Secondary | ICD-10-CM

## 2012-04-06 DIAGNOSIS — E119 Type 2 diabetes mellitus without complications: Secondary | ICD-10-CM

## 2012-04-06 LAB — CBC WITH DIFFERENTIAL/PLATELET
Basophils Relative: 0.4 % (ref 0.0–3.0)
Eosinophils Relative: 0.6 % (ref 0.0–5.0)
HCT: 37.1 % — ABNORMAL LOW (ref 39.0–52.0)
Hemoglobin: 12 g/dL — ABNORMAL LOW (ref 13.0–17.0)
Lymphs Abs: 1.9 10*3/uL (ref 0.7–4.0)
MCV: 91.6 fl (ref 78.0–100.0)
Monocytes Absolute: 1.8 10*3/uL — ABNORMAL HIGH (ref 0.1–1.0)
Monocytes Relative: 11.9 % (ref 3.0–12.0)
Neutro Abs: 11 10*3/uL — ABNORMAL HIGH (ref 1.4–7.7)
RBC: 4.06 Mil/uL — ABNORMAL LOW (ref 4.22–5.81)
WBC: 14.8 10*3/uL — ABNORMAL HIGH (ref 4.5–10.5)

## 2012-04-06 NOTE — Progress Notes (Signed)
Subjective:    Patient ID: [name] Newcomer Sr., male    DOB: 1932-07-14, 79 y.o.   MRN: [id number]  HPI  complains of L ankle and foot pain, esp big toe Onset 36h ago (yesterday AM) Hx same with gout flares Denies injury or trauma  Past Medical History  Diagnosis Date  . Stricture and stenosis of esophagus 12/07/2004    EGD done on 12/07/2004  . GERD (gastroesophageal reflux disease)   . Hypoxemia   . Increased prostate specific antigen (PSA) velocity 02/26/2011  . DM (diabetes mellitus) 02/26/2011  . Gout 02/26/2011  . COPD (chronic obstructive pulmonary disease)     oxygen at home, 3L 24/7  . OSA (obstructive sleep apnea) 02/26/2011    CPAP  . Atrial fibrillation   . Depression   . Cardiac arrest 2009    while in hospital   . Coronary artery disease     non obstructive cath 2006  . Hypertension   . HTN (hypertension) 02/26/2011  . Pacemaker   . Cardiac arrest 2009    while in hospital  . Cancer     lung s/p lobectomy x2 on RT lung  . Basal cell carcinoma of nose 02/26/2011  . Oxygen dependent   . ETOH abuse 01/29/2012    started drinking again - hx cardiac arrest in 2009 due to dt's with organ failure  . CKD (chronic kidney disease) 02/28/2012    Review of Systems  Constitutional: Negative for fever and fatigue.  Cardiovascular: Negative for chest pain, palpitations and leg swelling.  Musculoskeletal: Positive for joint swelling. Negative for myalgias and back pain.       Objective:   Physical Exam BP 162/84  Pulse 76  Temp 99.4 F (37.4 C) (Oral)  Resp 16  Wt 216 lb (97.977 kg)  SpO2 95% Constitutional:  He appears well-developed and well-nourished. Uncomfortable, but no distress. Sitting in WC. Wife at side Neck: Normal range of motion. Neck supple. No JVD present. No thyromegaly present.  Cardiovascular: Normal rate, regular rhythm and normal heart sounds.  No murmur heard. no BLE edema Pulmonary/Chest: Effort normal and breath sounds normal. No  respiratory distress. no wheezes.  Skin: L foot with diffuse swelling 1st MTP and lateral edge of forefoot - warm to touch, extending to lateral ankle, but FROM (with pain) great toe and ankle - no ulceration or laceration seen. Psychiatric: he has a normal mood and affect. behavior is normal. Judgment and thought content normal.   Lab Results  Component Value Date   WBC 13.0* 03/04/2012   HGB 11.9* 03/04/2012   HCT 36.3* 03/04/2012   PLT 437* 03/04/2012   GLUCOSE 113* 03/04/2012   CHOL 154 02/26/2011   TRIG 66.0 02/26/2011   HDL 51.60 02/26/2011   LDLCALC 89 02/26/2011   ALT 19 01/30/2012   AST 19 01/30/2012   NA 141 03/04/2012   K 3.3* 03/04/2012   CL 101 03/04/2012   CREATININE 1.80* 03/04/2012   BUN 28* 03/04/2012   CO2 30 03/04/2012   TSH 0.93 02/26/2011   PSA 2.71 02/26/2011   INR 1.09 02/09/2009   HGBA1C 6.1* 01/29/2012   MICROALBUR 46.5* 02/26/2011        Assessment & Plan:   L great toe and ankle pain - clinically consistent with gout and known hx same No hx for cellulitis, no other systemic symptoms -  Check DG - rule out foreign body, crystal changes or other bony changes Check CBC with diff but clinically suspect  gout>infection If DG and CBC ok, send 6d pred taper Advised on need for careful diet and avoid EtOH ("quit" 3 days ago)

## 2012-04-06 NOTE — Assessment & Plan Note (Signed)
Lab Results  Component Value Date   HGBA1C 6.1* 01/29/2012   Diet controlled, exacerbated by frequent steroids for COPD patient advised on anticipated increase in cbgs due to steroid effect for next several days - pt will call if cbg>200

## 2012-04-06 NOTE — Telephone Encounter (Signed)
Patient Information:  Caller Name: [name] Stevenson  Phone: [phone]  Patient: [name] Stevenson,   Gender: Male  DOB: 1933/02/26  Age: 79 Years  PCP: [name] (Adults only)  Office Follow Up:  Does the office need to follow up with this patient?: No  Instructions For The Office: N/A   Symptoms  Reason For Call & Symptoms: Recently hospitalized and seen in ER for cellulitis in knees. Yesterday he started to c/o pain in ankles and they looked red, no swelling. Kept legs elevated and today L big toe hurting and he is not wanting to get up. Hurts worse with weight bearing.  Reviewed Health History In EMR: Yes  Reviewed Medications In EMR: Yes  Reviewed Allergies In EMR: Yes  Reviewed Surgeries / Procedures: Yes  Date of Onset of Symptoms: 04/05/2012  Treatments Tried: Kept legs elevated, Gave Colchicine 2 tabs PO on 04/05/12 and 04/06/12, Hydrocodeine every 4 hours  Treatments Tried Worked: No  Guideline(s) Used:  Toe Pain  Disposition Per Guideline:   Go to Office Now  Reason For Disposition Reached:   Severe pain (e.g., excruciating, unable to do any normal activities) and not improved after 2 hours of pain medicine  Advice Given:  Reassurance:  It also sounds like you can take care of this yourself at home.  Here is some care advice that may help.  Call Back If:  Swelling, redness, or fever occur  Severe pain not relieved by pain medication  Pain lasts over 7 days  You become worse.  Appointment Scheduled:  04/06/2012 14:45:00 Appointment Scheduled Provider:  Rene Paci (Adults only)

## 2012-04-06 NOTE — Telephone Encounter (Signed)
Seen per Dr Felicity Coyer at [facility] today

## 2012-04-06 NOTE — Patient Instructions (Signed)
It was good to see you today. Test(s) ordered today. Your results will be released to MyChart (or called to you) after review, usually within 72hours after test completion. If any changes need to be made, you will be notified at that same time. If labs and xray ok, will send prednisone taper x 6 days to your pharmacy to use for gout continue the colchine and norco for pain as discssued

## 2012-04-07 ENCOUNTER — Telehealth: Payer: Self-pay

## 2012-04-07 ENCOUNTER — Other Ambulatory Visit: Payer: Self-pay | Admitting: Internal Medicine

## 2012-04-07 MED ORDER — PREDNISONE 10 MG PO TABS: ORAL_TABLET | ORAL | Status: DC

## 2012-04-07 NOTE — Telephone Encounter (Signed)
Wife called stating pt was seen in the office 04/06/12 and was supposed to have a rx for prednisone sent to pharmacy. She states that she has checked with pharmacy twice. Please advise Thanks

## 2012-04-07 NOTE — Telephone Encounter (Signed)
Wife calls concerned over medication related to the amount of pain and inflammation patient has --especially with the weather forecast and concerned that if it is called in later they may not be able to go and get it.  Please follow up with patient regarding concerns.

## 2012-04-07 NOTE — Telephone Encounter (Signed)
Wife was notified pt [name] Lame md will send in prednisone pack...[name] Stevenson

## 2012-04-07 NOTE — Telephone Encounter (Signed)
Wife called needing prednisone sent to pharmacy...lmb

## 2012-04-07 NOTE — Telephone Encounter (Signed)
i should forward to Dr Felicity Coyer who saw the pt

## 2012-04-07 NOTE — Telephone Encounter (Signed)
Valentina Gu, pred taper sent to pharmacy. Rene Kocher

## 2012-04-08 ENCOUNTER — Ambulatory Visit: Payer: Medicare Other | Admitting: Emergency Medicine

## 2012-04-13 ENCOUNTER — Ambulatory Visit (INDEPENDENT_AMBULATORY_CARE_PROVIDER_SITE_OTHER): Payer: Medicare Other | Admitting: *Deleted

## 2012-04-13 ENCOUNTER — Encounter: Payer: Self-pay | Admitting: Internal Medicine

## 2012-04-13 DIAGNOSIS — Z950 Presence of cardiac pacemaker: Secondary | ICD-10-CM

## 2012-04-13 DIAGNOSIS — I495 Sick sinus syndrome: Secondary | ICD-10-CM

## 2012-04-15 LAB — REMOTE PACEMAKER DEVICE
AL IMPEDENCE PM: 560 Ohm
ATRIAL PACING PM: 86.95
BAMS-0001: 170 {beats}/min
BATTERY VOLTAGE: 2.99 V

## 2012-04-21 ENCOUNTER — Encounter: Payer: Self-pay | Admitting: *Deleted

## 2012-04-23 ENCOUNTER — Other Ambulatory Visit: Payer: Self-pay | Admitting: *Deleted

## 2012-04-23 MED ORDER — OMEPRAZOLE 20 MG PO CPDR: DELAYED_RELEASE_CAPSULE | ORAL | Status: DC

## 2012-04-23 NOTE — Telephone Encounter (Signed)
Pharmacy requests refills for patient's omeprazole. Dr [name] sent Nexium #90 with 3 RF on 02/27/12. However, patient needs to switch back to omeprazole due to cost. Will send omeprazole rx and D/C Nexium rx.

## 2012-04-28 ENCOUNTER — Telehealth: Payer: Self-pay | Admitting: Internal Medicine

## 2012-04-28 MED ORDER — ALPRAZOLAM 0.5 MG PO TABS: 0.5000 mg | ORAL_TABLET | Freq: Three times a day (TID) | ORAL | Status: AC | PRN

## 2012-04-28 NOTE — Telephone Encounter (Signed)
Called [facility] informed of referral for this patient.

## 2012-04-28 NOTE — Telephone Encounter (Signed)
Done hardcopy to robin  Also , to refer to [facility] - copd

## 2012-05-01 ENCOUNTER — Ambulatory Visit: Payer: Medicare Other | Admitting: Emergency Medicine

## 2012-05-04 ENCOUNTER — Telehealth: Payer: Self-pay | Admitting: Emergency Medicine

## 2012-05-04 NOTE — Telephone Encounter (Signed)
Per [facility] OV Note, there is no urgent needs for patient to come in right on the 2 month mark.  I explained to pt that as long as he is not having any problems with his breathing and he is doing well that his rescheduled appt for 06/01/12 is ok. Pt aware to call and schedule sooner to TP is he is having increased sob or any other issues.

## 2012-05-06 ENCOUNTER — Telehealth: Payer: Self-pay | Admitting: Cardiology

## 2012-05-06 NOTE — Telephone Encounter (Signed)
That is usually initiated by [employer] I believe but we would certainly be glad to give him a prescription for it.

## 2012-05-06 NOTE — Telephone Encounter (Signed)
Will forward to  Dr. Brackbill for review 

## 2012-05-06 NOTE — Telephone Encounter (Signed)
New Problem    [name] Stevenson calling on behalf of pt. Wanting to know if she could get a Sign prescription from Dr. Patty Sermons for scale/device for heart failure monitor program. Would like to speak to nurse.

## 2012-05-07 NOTE — Telephone Encounter (Signed)
Left message to call back  

## 2012-05-08 NOTE — Telephone Encounter (Signed)
Spoke with Synetta Fail and she will be sending informations for  Dr. Patty Sermons to sign

## 2012-05-11 ENCOUNTER — Other Ambulatory Visit: Payer: Self-pay | Admitting: Nephrology

## 2012-05-12 ENCOUNTER — Ambulatory Visit
Admission: RE | Admit: 2012-05-12 | Discharge: 2012-05-12 | Disposition: A | Discharge status: Home / Self Care | Payer: Medicare Other | Source: Ambulatory Visit | Attending: Nephrology | Admitting: Nephrology

## 2012-05-13 ENCOUNTER — Telehealth: Payer: Self-pay | Admitting: Internal Medicine

## 2012-05-13 NOTE — Telephone Encounter (Signed)
Patient Information:  Caller Name: Violet  Phone: [phone]  Patient: [name] Stevenson, [name] Stevenson  Gender: Male  DOB: Nov 04, 1932  Age: 79 Years  PCP: [name] (Adults only)  Office Follow Up:  Does the office need to follow up with this patient?: Yes  Instructions For The Office: Please review.  Wanting Rx called in.   Symptoms  Reason For Call & Symptoms: Calling about Gout flair up started Mon 2/24.  Lot of pain, red spot on Left ankle with swelling.  Has been taking Hydrocodone Q4Hrs for pain and this is temporary relief.  Asking for Prednisone to be called in as usual  Reviewed Health History In EMR: Yes  Reviewed Medications In EMR: Yes  Reviewed Allergies In EMR: Yes  Reviewed Surgeries / Procedures: Yes  Date of Onset of Symptoms: 05/11/2012  Treatments Tried: Hydrocodone Q4Hrs  Treatments Tried Worked: Yes  Guideline(s) Used:  Ankle Pain  Disposition Per Guideline:   See Today in Office  Reason For Disposition Reached:   Redness of the skin and no fever  Advice Given:  Call Back If:  You become worse.  Patient Refused Recommendation:  Patient Requests Prescription  Taking Hydrocodone Q4Hrs,   Requesting Prednisone to be called into [facility] [phone].

## 2012-05-13 NOTE — Telephone Encounter (Signed)
Ok to see regina now

## 2012-05-13 NOTE — Telephone Encounter (Signed)
Pt declined an appt. Today.  Coming Thurs morning.

## 2012-05-14 ENCOUNTER — Encounter: Payer: Self-pay | Admitting: *Deleted

## 2012-05-14 ENCOUNTER — Ambulatory Visit (INDEPENDENT_AMBULATORY_CARE_PROVIDER_SITE_OTHER): Payer: Medicare Other | Admitting: Internal Medicine

## 2012-05-14 ENCOUNTER — Encounter: Payer: Self-pay | Admitting: Internal Medicine

## 2012-05-14 VITALS — BP 130/82 | HR 76 | Temp 97.7°F | Ht 67.0 in | Wt 219.0 lb

## 2012-05-14 MED ORDER — PREDNISONE 10 MG PO TABS: ORAL_TABLET | ORAL | Status: DC

## 2012-05-14 MED ORDER — ALLOPURINOL 100 MG PO TABS: 100.0000 mg | ORAL_TABLET | Freq: Every day | ORAL | Status: DC

## 2012-05-14 MED ORDER — METHYLPREDNISOLONE ACETATE 80 MG/ML IJ SUSP
80.0000 mg | Freq: Once | INTRAMUSCULAR | Status: AC
  Administered 2012-05-14: 80 mg via INTRAMUSCULAR

## 2012-05-14 NOTE — Progress Notes (Signed)
Subjective:    Patient ID: [name] Newcomer Sr., male    DOB: Aug 06, 1932, 79 y.o.   MRN: [id number]  HPI Here with wife, volume stable it seems on current diuretics/wt stable, but has new left ankle pain/red/swelling for 2-3 days;  No fever, trauma.  Overall good compliance with treatment, and good medicine tolerability.  Quit ETOH completely, but wife still quite stressed over the burden of his care.  For some reason not taking the allopurinol 100.   Pt denies polydipsia, polyuria.      Pt denies fever, wt loss, night sweats, loss of appetite, or other constitutional symptoms Past Medical History  Diagnosis Date  . Stricture and stenosis of esophagus 12/07/2004    EGD done on 12/07/2004  . GERD (gastroesophageal reflux disease)   . Hypoxemia   . Increased prostate specific antigen (PSA) velocity 02/26/2011  . DM (diabetes mellitus) 02/26/2011  . Gout 02/26/2011  . COPD (chronic obstructive pulmonary disease)     oxygen at home, 3L 24/7  . OSA (obstructive sleep apnea) 02/26/2011    CPAP  . Atrial fibrillation   . Depression   . Cardiac arrest 2009    while in hospital   . Coronary artery disease     non obstructive cath 2006  . Hypertension   . HTN (hypertension) 02/26/2011  . Pacemaker   . Cardiac arrest 2009    while in hospital  . Cancer     lung s/p lobectomy x2 on RT lung  . Basal cell carcinoma of nose 02/26/2011  . Oxygen dependent   . ETOH abuse 01/29/2012    started drinking again - hx cardiac arrest in 2009 due to dt's with organ failure  . CKD (chronic kidney disease) 02/28/2012   Past Surgical History  Procedure Laterality Date  . Lung lobectomy      x2 lobes on RT lung  . Knee arthroscopy      bilateral  . Pace maker    . Tonsillectomy    . Hernia repair      x2, esophageal  . Cardiac catheterization    . Esophagogastroduodenoscopy  10/25/2011    Procedure: ESOPHAGOGASTRODUODENOSCOPY (EGD);  Surgeon: Hart Carwin, MD;  Location: [facility];  Service:  Endoscopy;  Laterality: N/A;    reports that he quit smoking about 15 years ago. His smoking use included Cigarettes. He has a 75 pack-year smoking history. His smokeless tobacco use includes Chew. He reports that  drinks alcohol. He reports that he does not use illicit drugs. family history includes Cancer in his other; Heart disease in his father and other; Hypertension in his other; and Lung cancer in his mother. No Known Allergies Current Outpatient Prescriptions on File Prior to Visit  Medication Sig Dispense Refill  . albuterol (PROVENTIL HFA;VENTOLIN HFA) 108 (90 BASE) MCG/ACT inhaler Inhale 2 puffs into the lungs every 6 (six) hours as needed for wheezing.  1 Inhaler  6  . ALPRAZolam (XANAX) 0.5 MG tablet Take 1 tablet (0.5 mg total) by mouth 3 (three) times daily as needed. For anxiety/sleeplessness.  90 tablet  3  . amoxicillin-clavulanate (AUGMENTIN) 875-125 MG per tablet Take 1 tablet by mouth every 12 (twelve) hours.  14 tablet  0  . aspirin 81 MG tablet Take 81 mg by mouth daily.        Marland Kitchen atorvastatin (LIPITOR) 40 MG tablet Take 40 mg by mouth at bedtime.      . budesonide-formoterol (SYMBICORT) 160-4.5 MCG/ACT inhaler Inhale 2 puffs  into the lungs 2 (two) times daily.  3 Inhaler  3  . citalopram (CELEXA) 10 MG tablet Take 1 tablet (10 mg total) by mouth daily.  90 tablet  3  . colchicine 0.6 MG tablet Take 1 tablet (0.6 mg total) by mouth daily as needed. For gout flares  90 tablet  3  . diltiazem (TIAZAC) 240 MG 24 hr capsule Take 1 capsule (240 mg total) by mouth daily.  90 capsule  3  . fish oil-omega-3 fatty acids 1000 MG capsule Take 2 g by mouth daily.        . furosemide (LASIX) 40 MG tablet To take 2 tabs by mouth in the AM, and 1 in the PM  270 tablet  3  . hydrALAZINE (APRESOLINE) 25 MG tablet Take 1 tablet (25 mg total) by mouth 2 (two) times daily.  180 tablet  3  . HYDROcodone-acetaminophen (NORCO) 7.5-325 MG per tablet Take 1 tablet by mouth every 6 (six) hours as  needed for pain.  120 tablet  5  . hydroxypropyl methylcellulose (ISOPTO TEARS) 2.5 % ophthalmic solution Place 2 drops into both eyes as needed. For dry/irritated eyes.      . magnesium oxide (DIASENSE MAGNESIUM) 400 (241.3 MG) MG tablet Take 1 tablet (400 mg total) by mouth daily.  90 tablet  3  . metoprolol (LOPRESSOR) 50 MG tablet Take 1 tablet (50 mg total) by mouth 2 (two) times daily.  180 tablet  3  . Multiple Vitamins-Minerals (EYE VITAMINS) TABS Take 1 tablet by mouth 2 (two) times daily.        . multivitamin (THERAGRAN) per tablet Take 1 tablet by mouth daily.        Marland Kitchen omeprazole (PRILOSEC) 20 MG capsule Take 1 tablet by mouth once daily. PHARMACY-PLEASE D/C NEXIUM RX...TOO EXPENSIVE FOR PATIENT.  90 capsule  1  . potassium chloride SA (K-DUR,KLOR-CON) 20 MEQ tablet Take 1 tablet (20 mEq total) by mouth daily.  90 tablet  3  . temazepam (RESTORIL) 30 MG capsule Take 1 capsule (30 mg total) by mouth at bedtime.  90 capsule  1  . tiotropium (SPIRIVA) 18 MCG inhalation capsule Place 1 capsule (18 mcg total) into inhaler and inhale daily.  90 capsule  3  . vitamin B-12 (CYANOCOBALAMIN) 100 MCG tablet Take 50 mcg by mouth daily.        . vitamin E 600 UNIT capsule Take 600 Units by mouth 2 (two) times daily.         No current facility-administered medications on file prior to visit.    Review of Systems  Constitutional: Negative for unexpected weight change, or unusual diaphoresis  HENT: Negative for tinnitus.   Eyes: Negative for photophobia and visual disturbance.  Respiratory: Negative for choking and stridor.   Gastrointestinal: Negative for vomiting and blood in stool.  Genitourinary: Negative for hematuria and decreased urine volume.  Musculoskeletal: Negative for acute joint swelling Skin: Negative for color change and wound.  Neurological: Negative for tremors and numbness other than noted  Psychiatric/Behavioral: Negative for decreased concentration or  hyperactivity.        Objective:   Physical Exam BP 130/82  Pulse 76  Temp(Src) 97.7 F (36.5 C) (Oral)  Ht 5\' 7"  (1.702 m)  Wt 219 lb (99.338 kg)  BMI 34.29 kg/m2  SpO2 95% VS noted,  Constitutional: Pt appears well-developed and well-nourished.  HENT: Head: NCAT.  Right Ear: External ear normal.  Left Ear: External ear normal.  Eyes:  Conjunctivae and EOM are normal. Pupils are equal, round, and reactive to light.  Neck: Normal range of motion. Neck supple.  Cardiovascular: Normal rate and regular rhythm.   Pulmonary/Chest: Effort normal and breath sounds normal.  Abd:  Soft, NT, non-distended, + BS Neurological: Pt is alert. Not confused  Skin: Skin is warm. No erythema. 1+ ankle edema bilat, but also left ankle effusion 2+ with tender, mild erythema, no ulcer or drainage Psychiatric: Pt behavior is normal. Thought content normal.     Assessment & Plan:

## 2012-05-14 NOTE — Patient Instructions (Addendum)
You had the steroid shot today Please take all new medication as prescribed - the low dose prednisone Please re-start the allopurinol at 100 mg per day Please confirm this with your nephrologist, so that this could be changed to the more expensive colchicine Please continue all other medications as before, and refills have been done if requested. Please keep your appointments with your specialists as you have planned Please remember to sign up for My Chart if you have not done so, as this will be important to you in the future with finding out test results, communicating by private email, and scheduling acute appointments online when needed.

## 2012-05-14 NOTE — Assessment & Plan Note (Signed)
stable overall by history and exam, recent data reviewed with pt, and pt to continue medical treatment as before,  to f/u any worsening symptoms or concerns BP Readings from Last 3 Encounters:  05/14/12 130/82  04/06/12 162/84  03/04/12 176/84

## 2012-05-14 NOTE — Assessment & Plan Note (Signed)
stable overall by history and exam, recent data reviewed with pt, and pt to continue medical treatment as before,  to f/u any worsening symptoms or concerns Lab Results  Component Value Date   HGBA1C 6.1* 01/29/2012

## 2012-05-14 NOTE — Assessment & Plan Note (Signed)
Mild to mod, for depomedrol, predpack course,  Re-start allopurinol 100, to f/u any worsening symptoms or concerns

## 2012-06-01 ENCOUNTER — Encounter: Payer: Self-pay | Admitting: Emergency Medicine

## 2012-06-01 ENCOUNTER — Ambulatory Visit (INDEPENDENT_AMBULATORY_CARE_PROVIDER_SITE_OTHER): Payer: Medicare Other | Admitting: Emergency Medicine

## 2012-06-01 ENCOUNTER — Ambulatory Visit: Payer: Medicare Other | Admitting: Emergency Medicine

## 2012-06-01 VITALS — BP 138/90 | HR 74 | Temp 98.0°F | Ht 67.0 in | Wt 217.8 lb

## 2012-06-01 DIAGNOSIS — J449 Chronic obstructive pulmonary disease, unspecified: Secondary | ICD-10-CM

## 2012-06-01 NOTE — Patient Instructions (Addendum)
Please continue your current medications Try taking mucinex 600mg  daily for a month to see if this helps you clear your secretions.  Use your BiPAP and oxygen as you are doing.  Follow with Dr [name] in 2-3 months or sooner if you have any problems.

## 2012-06-01 NOTE — Assessment & Plan Note (Signed)
Please continue your current medications Try taking mucinex 600mg daily for a month to see if this helps you clear your secretions.  Use your BiPAP and oxygen as you are doing.  Follow with Dr Byrum in 2-3 months or sooner if you have any problems.  

## 2012-06-01 NOTE — Progress Notes (Signed)
Subjective:    Patient ID: [name] Newcomer Sr., male    DOB: [birth year], 79 y.o.   MRN: [id number] HPI 79 yo man, with severe COPD and chronic hypoxemia. He also has a history of non-small cell lung cancer, status post right upper lobectomy and adjuvant chemotherapy in 2006.   ROV 10/25/09 -- COPD and OSA. Tells me has been feeling well. Last Ct scan showed no evidence recurrance of his lung CA. Breathing has been OK, the heat has limited his activity. Last time we changed Advair to Symbicort - has helped his irritated throat, has also made his breathing a bit better. Never had to stop his Lisinopril. No new problems, no exacerbations.   ROV 05/04/10 -- Hx COPD and OSA, also hx NSCLCA and RUL lobectomy. Reports that he was treated for AE-COPD with abx and pred. Has been on Spiriva and Symbicort, cut back on the symbicort due to cost. Has been using once daily instead of two times a day. Had the pneumovax 7 yrs ago, shouldn't tneed it again. Having CT scans of the chest followed by Dr Welton Flakes, annually. Wears CPAP every night.   ROV 10/19/10 -- COPD, OSA, NSCLCA s/p RUL lobectomy. He had been getting CT scans annually, planning to see Dr Welton Flakes. May be having some decreased energy since February. He has been having difficulty with his swallowing - some dysphagia, food getting stuck. Sometimes causes him to get choked. Wears CPAP mask reliably.  Only using Symbicort daily, Spiriva.   03/20/2011 Pt ill for one week.  Rx levaquin x 5days 750/d. Not improved.  Severe dyspnea at rest. Not able to walk. No real chest pain.  Notes more wheezing.  Severe dyspnea even at rest.  No real fever T99.7  At home.  Did get flu vaccine in November. >>RX Avelox x 7 days , steroid taper.XR w/ no acute changes   Follow up  Pt returns for a 1 week follow up for COPD flare. Seen in office last week, tx with Avelox and steroid taper. CXR with no acute process last ov.  Feeling some better but still has a lot of coughing and  congestion  Finished Avelox yesterday. Has few days left on prednisone.  No hemotpysis or chest pain .   ROV 04/18/11 -- Severe COPD, OSA, NSCLCA s/p RUL lobectomy. He was seen beginning of the month for an exacerbation following probable URI, CXR showed no infiltrates. He was treated w steroids and abx. He is improved, close to baseline. Using Spiriva and Symbicort.   ROV 06/05/11 -- Severe COPD, OSA, NSCLCA s/p RUL lobectomy. He still hasn't gotten back to prior baseline since URI and AE. He is on 3L/min pulsed. He does exert but w difficulty. He is on Spiriva + Symbicort.   ROV 08/29/11 -- Severe COPD, OSA, NSCLCA s/p RUL lobectomy. He has remained active, still limited w exertion. He is on Spiriva + Symbicort. No exacerbations or pred, no abx. No cough or wheeze. He is having more LE edema, has started on lasix. Wears his CPAP reliably  ROV 11/28/11 -- Severe COPD, OSA, NSCLCA s/p RUL lobectomy. He has been doing well, but probably less active this year. He and his wife have worried about his oxygen, the tanks. Once they ran out on a trip from [place] and they were panicked. He hasn't had any exacerbations, hospitalizations. Remains on Spiriva + Symbicort. He is having gout flare L great toe. He had EGD for anemia eval >> reassuring.   01/14/12  Acute  Productive cough, increased SOB, wheezing, tightness in chest onset today, began with head congestion w/ clear drainage x1 week -  Cough is getting worse .  otc cold meds not working No hemoptysis or chest pain  No edema.   ROV 02/07/12 -- Severe COPD, OSA, NSCLCA s/p RUL lobectomy. Also with hx CAD, HTN, A Fib, diastolic CHF. He was admitted last week for cellulitis vs septic arthritis (no fluid able to be aspirated). He was d/c on his same BD's, NIPPV, abx (finished).   ROV 06/01/12 --  Severe COPD, OSA, NSCLCA s/p RUL lobectomy. Also with hx CAD, HTN, A Fib, diastolic CHF. Having thick white phlegm, taking mucinex. Since last visit he has been  evaluated by renal. No AE since last time. Wearing 3L/min, on Spiriva + Symbicort. Uses SABA 2-3x a week.       Objective:   Physical Exam Filed Vitals:   06/01/12 1341  BP: 138/90  Pulse: 74  Temp: 98 F (36.7 C)    GEN: A/Ox3; pleasant , NAD  HEENT:  [place]/AT,  EACs-clear, TMs-wnl, NOSE-clear drainage, THROAT-clear, no lesions, no postnasal drip or exudate noted.   NECK:  Supple w/ fair ROM; no JVD; normal carotid impulses w/o bruits; no thyromegaly or nodules palpated; no lymphadenopathy.  RESP  no accessory muscle use, prolonged expiration, no crackles, no wheezing  CARD:  RRR, no m/r/g  , no peripheral edema, pulses intact, no cyanosis or clubbing.  Musco: Warm bil, 1+ RLE edema, trace LLE edema  Neuro: alert, no focal deficits noted.    Skin: Warm, no lesions or rashes   CT scan 02/05/12 --  Comparison: 08/07/2007  CT CHEST  Findings: Lungs/pleura: There is no pleural effusion. Mild  changes of centrilobular emphysema identified. Status post right  lower lobectomy. Focal area of subpleural scar is identified in  the right upper lobe, image 15. New from previous exam. No  specific features identified to suggest residual or recurrence of  tumor.  Heart/Mediastinum: There is a left chest wall porta-catheter with  tip in the cavoatrial junction. Right chest wall pacer device is  noted with tips in the right atrial appendage and right ventricle.  Calcified atherosclerotic disease affects the LAD, RCA and left  circumflex coronary arteries. There is no mediastinal or hilar  adenopathy identified.  Bones/Musculoskeletal: There is no axillary or supraclavicular  adenopathy. Postoperative changes involving the right posterior  ribs identified. Chronic healed left posterior rib fracture  deformity also noted. There are no aggressive lytic or sclerotic  bone lesions identified.  IMPRESSION:  1. Essentially stable examination without specific features to  suggest residual  or recurrence of tumor.  2. Prior right lower lobectomy.  3. Coronary artery calcifications.      Assessment & Plan:  CHRONIC OBSTRUCTIVE PULMONARY DISEASE, SEVERE Please continue your current medications Try taking mucinex 600mg  daily for a month to see if this helps you clear your secretions.  Use your BiPAP and oxygen as you are doing.  Follow with Dr [name] in 2-3 months or sooner if you have any problems.

## 2012-06-05 ENCOUNTER — Telehealth: Payer: Self-pay | Admitting: Cardiology

## 2012-06-05 NOTE — Telephone Encounter (Signed)
I talked with Synetta Fail at [facility] they would like to mail pt out a scale to monitor his weights. Last weight given to her [facility] will be sending Dr. Patty Sermons a fax for the order. If he agrees, just sign and fax back in.  Will forward this to [place]. Mylo Red RN

## 2012-06-05 NOTE — Telephone Encounter (Signed)
New problem    [employer] is trying to get pt a scale and they need his last weight. Please call Synetta Fail about this matter.

## 2012-06-10 ENCOUNTER — Ambulatory Visit (INDEPENDENT_AMBULATORY_CARE_PROVIDER_SITE_OTHER): Payer: Medicare Other | Admitting: Cardiology

## 2012-06-10 ENCOUNTER — Encounter: Payer: Self-pay | Admitting: Cardiology

## 2012-06-10 ENCOUNTER — Other Ambulatory Visit (INDEPENDENT_AMBULATORY_CARE_PROVIDER_SITE_OTHER): Payer: Medicare Other

## 2012-06-10 VITALS — BP 160/88 | HR 68 | Ht 68.0 in | Wt 215.0 lb

## 2012-06-10 DIAGNOSIS — I119 Hypertensive heart disease without heart failure: Secondary | ICD-10-CM

## 2012-06-10 DIAGNOSIS — I509 Heart failure, unspecified: Secondary | ICD-10-CM

## 2012-06-10 DIAGNOSIS — I495 Sick sinus syndrome: Secondary | ICD-10-CM

## 2012-06-10 DIAGNOSIS — I5032 Chronic diastolic (congestive) heart failure: Secondary | ICD-10-CM

## 2012-06-10 DIAGNOSIS — I2510 Atherosclerotic heart disease of native coronary artery without angina pectoris: Secondary | ICD-10-CM

## 2012-06-10 LAB — HEPATIC FUNCTION PANEL
ALT: 17 U/L (ref 0–53)
AST: 18 U/L (ref 0–37)
Albumin: 3.3 g/dL — ABNORMAL LOW (ref 3.5–5.2)
Alkaline Phosphatase: 79 U/L (ref 39–117)
Bilirubin, Direct: 0 mg/dL (ref 0.0–0.3)
Total Bilirubin: 0.5 mg/dL (ref 0.3–1.2)
Total Protein: 6.4 g/dL (ref 6.0–8.3)

## 2012-06-10 LAB — BASIC METABOLIC PANEL
CO2: 29 mEq/L (ref 19–32)
Calcium: 8.9 mg/dL (ref 8.4–10.5)
Creatinine, Ser: 1.8 mg/dL — ABNORMAL HIGH (ref 0.4–1.5)
Sodium: 136 mEq/L (ref 135–145)

## 2012-06-10 LAB — LIPID PANEL
Cholesterol: 148 mg/dL (ref 0–200)
HDL: 55.6 mg/dL (ref >39.00)
LDL Cholesterol: 79 mg/dL (ref 0–99)
Total CHOL/HDL Ratio: 3
Triglycerides: 68 mg/dL (ref 0.0–149.0)

## 2012-06-10 NOTE — Patient Instructions (Addendum)
Your physician recommends that you continue on your current medications as directed. Please refer to the Current Medication list given to you today.  Your physician wants you to follow-up in: 4 months with fasting labs (lp/bmet/hfp) You will receive a reminder letter in the mail two months in advance. If you don't receive a letter, please call our office to schedule the follow-up appointment.  

## 2012-06-10 NOTE — Assessment & Plan Note (Signed)
The patient has been feeling better on his current dose of furosemide.  He has less dyspnea.  His weight is down 7 pounds since last visit.

## 2012-06-10 NOTE — Assessment & Plan Note (Signed)
The patient has a past history of sinus node dysfunction with tachybradycardia syndrome and paroxysmal atrial fibrillation.  He has a functioning pacemaker followed by Dr. Graciela Husbands.  The patient has not been experiencing any dizzy spells or syncope

## 2012-06-10 NOTE — Assessment & Plan Note (Signed)
The patient has not been expressing any recent chest pain or evidence of angina pectoris

## 2012-06-10 NOTE — Progress Notes (Signed)
[name] Newcomer Sr. Date of Birth:  1932-12-16 [facility] [address] [place], [place]  [address] [phone]         Fax   [phone]  History of Present Illness: This pleasant 79 year old gentleman is seen for a six-month followup office visit. He has a complex past medical history. He has a history of coronary atherosclerotic heart disease and had a previous cardiac catheterization showing nonobstructive coronary disease. He has a history of sick sinus syndrome with tachybradycardia syndrome and he has a functioning dual-chamber pacemaker. He is followed for this by Dr. Graciela Husbands. He has a remote history of lung cancer treated by Dr. Edwyna Shell. There has been no evidence of recurrence. The patient has severe COPD and is on home oxygen at 3 L per minute around-the-clock. He has a history of sleep apnea and uses a CPAP machine at night patient also has a history of macular degeneration and a history of diabetes mellitus and a history of dyslipidemia. Recently he was found to be running a higher blood pressure and his medications were recently adjusted upward.  Since last visit he has seen Dr. Briant Cedar for severe edema and was placed on furosemide 80 mg in the morning and 40 mg in the evening.  In November 2013 he was hospitalized for about 4 days with congestive heart failure and cellulitis.  The patient had an echocardiogram 06/13/11 which showed an ejection fraction of 55-60% and evidence of chronic diastolic dysfunction.  Current Outpatient Prescriptions  Medication Sig Dispense Refill  . albuterol (PROVENTIL HFA;VENTOLIN HFA) 108 (90 BASE) MCG/ACT inhaler Inhale 2 puffs into the lungs every 6 (six) hours as needed for wheezing.  1 Inhaler  6  . allopurinol (ZYLOPRIM) 100 MG tablet Take 1 tablet (100 mg total) by mouth daily.  90 tablet  3  . ALPRAZolam (XANAX) 0.5 MG tablet Take 1 tablet (0.5 mg total) by mouth 3 (three) times daily as needed. For anxiety/sleeplessness.  90  tablet  3  . aspirin 81 MG tablet Take 81 mg by mouth daily.        Marland Kitchen atorvastatin (LIPITOR) 40 MG tablet Take 40 mg by mouth at bedtime.      . budesonide-formoterol (SYMBICORT) 160-4.5 MCG/ACT inhaler Inhale 2 puffs into the lungs 2 (two) times daily.  3 Inhaler  3  . citalopram (CELEXA) 10 MG tablet Take 1 tablet (10 mg total) by mouth daily.  90 tablet  3  . colchicine 0.6 MG tablet Take 1 tablet (0.6 mg total) by mouth daily as needed. For gout flares  90 tablet  3  . diltiazem (TIAZAC) 240 MG 24 hr capsule Take 1 capsule (240 mg total) by mouth daily.  90 capsule  3  . fish oil-omega-3 fatty acids 1000 MG capsule Take 2 g by mouth daily.        . furosemide (LASIX) 40 MG tablet To take 2 tabs by mouth in the AM, and 1 in the PM  270 tablet  3  . hydrALAZINE (APRESOLINE) 25 MG tablet Take 1 tablet (25 mg total) by mouth 2 (two) times daily.  180 tablet  3  . HYDROcodone-acetaminophen (NORCO) 7.5-325 MG per tablet Take 1 tablet by mouth every 6 (six) hours as needed for pain.  120 tablet  5  . hydroxypropyl methylcellulose (ISOPTO TEARS) 2.5 % ophthalmic solution Place 2 drops into both eyes as needed. For dry/irritated eyes.      . metoprolol (LOPRESSOR) 50 MG tablet Take  1 tablet (50 mg total) by mouth 2 (two) times daily.  180 tablet  3  . Multiple Vitamins-Minerals (EYE VITAMINS) TABS Take 1 tablet by mouth 2 (two) times daily.        . multivitamin (THERAGRAN) per tablet Take 1 tablet by mouth daily.        Marland Kitchen omeprazole (PRILOSEC) 20 MG capsule Take 1 tablet by mouth once daily. PHARMACY-PLEASE D/C NEXIUM RX...TOO EXPENSIVE FOR PATIENT.  90 capsule  1  . temazepam (RESTORIL) 30 MG capsule Take 1 capsule (30 mg total) by mouth at bedtime.  90 capsule  1  . tiotropium (SPIRIVA) 18 MCG inhalation capsule Place 1 capsule (18 mcg total) into inhaler and inhale daily.  90 capsule  3  . vitamin B-12 (CYANOCOBALAMIN) 100 MCG tablet Take 50 mcg by mouth daily.        . vitamin E 600 UNIT capsule  Take 600 Units by mouth 2 (two) times daily.        . magnesium oxide (DIASENSE MAGNESIUM) 400 (241.3 MG) MG tablet Take 1 tablet (400 mg total) by mouth daily.  90 tablet  3  . potassium chloride SA (K-DUR,KLOR-CON) 20 MEQ tablet Take 1 tablet (20 mEq total) by mouth daily.  90 tablet  3   No current facility-administered medications for this visit.    No Known Allergies  Patient Active Problem List  Diagnosis  . CARCINOMA, LUNG, SQUAMOUS CELL  . CORONARY ARTERY DISEASE-nonobstructive disease 2006 catheterization  . CHRONIC OBSTRUCTIVE PULMONARY DISEASE, SEVERE  . GERD  . HYPERSOMNIA WITH SLEEP APNEA UNSPECIFIED  . Hypoxemia  . Pacemaker  . Hypercholesterolemia  . Hyperuricemia  . Benign hypertensive heart disease without heart failure  . Dysphagia  . Nonspecific (abnormal) findings on radiological and other examination of gastrointestinal tract  . Preventative health care  . Anxiety  . Depression  . Chronic pain  . Macular degeneration  . OSA (obstructive sleep apnea)  . Basal cell carcinoma of nose  . HTN (hypertension)  . Increased prostate specific antigen (PSA) velocity  . DM (diabetes mellitus)  . Gout attack  . Sinus node dysfunction  . Atrial fibrillation  . Melena  . EtOH dependence  . Chronic diastolic CHF (congestive heart failure)  . CKD (chronic kidney disease)  . Acute gouty arthritis    History  Smoking status  . Former Smoker -- 1.50 packs/day for 50 years  . Types: Cigarettes  . Quit date: 06/28/1996  Smokeless tobacco  . Current User  . Types: Chew    History  Alcohol Use  . Yes    Comment: 2 beers 2-3 times /week    Family History  Problem Relation Age of Onset  . Heart disease Father   . Lung cancer Mother   . Cancer Other     lung cancer  . Heart disease Other   . Hypertension Other     Review of Systems: Constitutional: no fever chills diaphoresis or fatigue or change in weight.  Head and neck: no hearing loss, no  epistaxis, no photophobia or visual disturbance. Respiratory: No cough, shortness of breath or wheezing. Cardiovascular: No chest pain peripheral edema, palpitations. Gastrointestinal: No abdominal distention, no abdominal pain, no change in bowel habits hematochezia or melena. Genitourinary: No dysuria, no frequency, no urgency, no nocturia. Musculoskeletal:No arthralgias, no back pain, no gait disturbance or myalgias. Neurological: No dizziness, no headaches, no numbness, no seizures, no syncope, no weakness, no tremors. Hematologic: No lymphadenopathy, no easy bruising. Psychiatric:  No confusion, no hallucinations, no sleep disturbance.    Physical Exam: Filed Vitals:   06/10/12 1105  BP: 160/88  Pulse: 68   the general appearance reveals a well-developed well-nourished gentleman in no distress.  He is on round-the-clock oxygen.The head and neck exam reveals pupils equal and reactive.  Extraocular movements are full.  There is no scleral icterus.  The mouth and pharynx are normal.  The neck is supple.  The carotids reveal no bruits.  The jugular venous pressure is normal.  The  thyroid is not enlarged.  There is no lymphadenopathy.  The chest is clear to percussion and auscultation.  There are no rales or rhonchi.  Breath sounds are generally distant consistent with his COPD.  Expansion of the chest is symmetrical.  The precordium is quiet.  The first heart sound is normal.  The second heart sound is physiologically split.  There is no murmur gallop rub or click.  There is no abnormal lift or heave.  The abdomen is soft and nontender.  The bowel sounds are normal.  The liver and spleen are not enlarged.  There are no abdominal masses.  There are no abdominal bruits.  Extremities reveal good pedal pulses.  There is no phlebitis or edema.  There is no cyanosis or clubbing.  Strength is normal and symmetrical in all extremities.  There is no lateralizing weakness.  There are no sensory deficits.   The skin is warm and dry.  There is no rash.     Assessment / Plan: The patient is to continue same medication.  We are checking fasting lab work today.  Recheck in 4 months for followup office visit EKG and fasting lab work

## 2012-06-11 NOTE — Progress Notes (Signed)
Quick Note:  Please report to patient. The recent labs are stable. Continue same medication and careful diet. ______ 

## 2012-06-15 ENCOUNTER — Telehealth: Payer: Self-pay | Admitting: *Deleted

## 2012-06-15 NOTE — Telephone Encounter (Signed)
Mailed copy of labs and left message to call if any questions  

## 2012-06-15 NOTE — Telephone Encounter (Signed)
Message copied by Burnell Blanks on Mon Jun 15, 2012  8:39 AM ------      Message from: Cassell Clement      Created: Thu Jun 11, 2012 12:09 PM       Please report to patient.  The recent labs are stable. Continue same medication and careful diet. ------

## 2012-06-17 ENCOUNTER — Other Ambulatory Visit: Payer: Self-pay

## 2012-06-17 MED ORDER — CITALOPRAM HYDROBROMIDE 10 MG PO TABS: 10.0000 mg | ORAL_TABLET | Freq: Every day | ORAL | Status: AC

## 2012-07-06 ENCOUNTER — Telehealth: Payer: Self-pay | Admitting: Emergency Medicine

## 2012-07-06 NOTE — Telephone Encounter (Signed)
I spoke with spouse and she stated there is no way she can bring him in bc she just got out of the hospital herself. She stated she can;t drive and has no one else to bring him in. She states she is keeping his legs elevated and no salt in diet. She stated we can call her back tomorrow that would be fine. Please advise RB thanks

## 2012-07-06 NOTE — Telephone Encounter (Signed)
I don;t think a pred taper will help isolated edema - in fact it could make it worse. It sounds like he needs to be seen, either by [place] or by PCP. You can try to fit him in w me or TP.

## 2012-07-06 NOTE — Telephone Encounter (Signed)
Spoke with pt's spouse She states that the pt is having swelling in his ankles x 2 days No SOB, or other co's She states that he took 80 mg lasix this am and 120 mg this afternoon She is asking for prednisone taper for this Please advise thanks! No Known Allergies

## 2012-07-07 ENCOUNTER — Telehealth: Payer: Self-pay | Admitting: *Deleted

## 2012-07-07 NOTE — Telephone Encounter (Signed)
Wife unable to drive. Advised of increase, will call back if no better

## 2012-07-07 NOTE — Telephone Encounter (Signed)
ATC line busy x 4 wcb 

## 2012-07-07 NOTE — Telephone Encounter (Signed)
Understand that he cannot come in to be assessed. I don't think I can make any insightful recs on the edema without seeing him. He is followed by Dr Patty Sermons (cards) and also Dr Briant Cedar (renal, who has been managing the lasix). Optimally, he needs labs, physical exam, etc. From my standpoint, agree with the low salt, elevated LE's and fluid restriction. Other important issue is that he needs to remain well oxygenated (SpO2> 90%) as hypoxemia can contribute to R heart dysfxn and edema. In absence of over pulmonary sx I would not recommend prednisone and would recommend that they speak to [facility] about balancing lasix dosing with renal fxn.

## 2012-07-07 NOTE — Telephone Encounter (Signed)
Increase Lasix to 120 mg in the morning and 80 mg in the evening

## 2012-07-07 NOTE — Telephone Encounter (Signed)
Legs are very swollen, right worse than left. Does not seem to be discolored. Swelling does go some at night. Takes Lasix 80 mg twice a day and watching sodium intake. Denies any increase in shortness of breath. Will forward to  Dr. Patty Sermons for review.

## 2012-07-07 NOTE — Telephone Encounter (Signed)
Increase Lasix up to 120 mg in the morning and continue with 80 mg in the evening and keep legs elevated

## 2012-07-08 NOTE — Telephone Encounter (Signed)
Spoke with pt's spouse and notified of recs per RB She verbalized understanding  States that she has spoken with Dr Patty Sermons regarding this and lasix is being upped Nothing further needed

## 2012-07-09 ENCOUNTER — Telehealth: Payer: Self-pay

## 2012-07-09 MED ORDER — PREDNISONE 10 MG PO TABS: ORAL_TABLET | ORAL | Status: DC

## 2012-07-09 NOTE — Telephone Encounter (Signed)
Pharmacy requesting refill on prednisone 10 mg tablets take two tablets by mouth daily for 5 days.  Please advise

## 2012-07-09 NOTE — Telephone Encounter (Signed)
Ok - to robin to handle 

## 2012-07-13 ENCOUNTER — Telehealth: Payer: Self-pay | Admitting: Internal Medicine

## 2012-07-13 ENCOUNTER — Telehealth: Payer: Self-pay | Admitting: Cardiology

## 2012-07-13 ENCOUNTER — Telehealth: Payer: Self-pay

## 2012-07-13 MED ORDER — HYDRALAZINE HCL 50 MG PO TABS: 50.0000 mg | ORAL_TABLET | Freq: Two times a day (BID) | ORAL | Status: DC

## 2012-07-13 NOTE — Telephone Encounter (Signed)
Appear to have been an error, rx corrected and re-sent

## 2012-07-13 NOTE — Telephone Encounter (Signed)
New problem   Pt need new prescription for Hydralazine 25mg  for and 2 afternoon sent to [facility] [phone]. If any question give pt a call.

## 2012-07-13 NOTE — Telephone Encounter (Signed)
Patient was previously on 50 mg BID Hydralazine, newest rx is 25 mg BID pt. Unaware of change. Please advise

## 2012-07-13 NOTE — Telephone Encounter (Signed)
Reviewed chart and it looks as though Dr [name] sent over Rx, advised husband. To call back tomorrow if any problems with Rx

## 2012-07-13 NOTE — Telephone Encounter (Signed)
I think this was an error  I went ahead and changed to prescription to 50 bid (one pill twice per day) to take less pills, and avoid more confusion in the future - done erx

## 2012-07-13 NOTE — Telephone Encounter (Signed)
Pt wants to know why the last RX for Hydralazine 25 mg was written for 1 in the am an 1 in the afternoon.  She has been giving him 2 in the am and 2 in the afternoon.  The pharmacy gave her 10 to last until she could find out the reason it was cut back.  She is almost out.

## 2012-07-31 ENCOUNTER — Telehealth: Payer: Self-pay | Admitting: Emergency Medicine

## 2012-07-31 DIAGNOSIS — J9610 Chronic respiratory failure, unspecified whether with hypoxia or hypercapnia: Secondary | ICD-10-CM

## 2012-07-31 NOTE — Telephone Encounter (Signed)
Called, spoke with pt's wife. Informed her of below per RA. She verbalized understanding and is to call back if she doesn't hear anything from DME to get study set up. Wife is aware RB ok with increasing o2 to 4 lpm. Concerned on pt's breathing being "much worse."  He can hardly walk 75 ft from the bathroom to the living room and to take a bath. Pt states she does use the albuterol HFA with some relief. I offered OV on Monday given pt's breathing has worsened. Wife states they aren't able to come in on Monday but can come in on Tuesday.  We have scheduled pt to see VS on Tuesday, May 20 at 1:30 pm.  Wife is aware and is aware to call back if symptoms worsen or to seek emergency care/call 911 if needed.  She verbalized understanding.

## 2012-07-31 NOTE — Telephone Encounter (Signed)
Last OV 06-01-12. I spoke with the pt wife and she states that the pt has been having increased SOB x 2 weeks. She states he does not have any increase in cough, chest congestion, mucus, only the SOB. She states last night she increased his oxygen to 4 liters and he slept better then he has slept in a while and looked better as well. She states he did not seem as SOB either. She wanted to know if there was anything else that they could do and if 4 liters was ok? Please advise. [name] Stevenson, CMA  They requested samples and these have been left at front and pt is aware.

## 2012-07-31 NOTE — Telephone Encounter (Signed)
I ordered the auto-titration study.

## 2012-07-31 NOTE — Telephone Encounter (Signed)
I don't see any harm in turning the O2 up 4L/min. We also need to to insure that his BiPAP is set correctly. Will order a BiPAP auto-titration study to insure that this is the case.

## 2012-08-03 ENCOUNTER — Ambulatory Visit: Payer: Medicare Other | Admitting: Pulmonary Disease

## 2012-08-04 ENCOUNTER — Encounter: Payer: Self-pay | Admitting: Pulmonary Disease

## 2012-08-04 ENCOUNTER — Ambulatory Visit (INDEPENDENT_AMBULATORY_CARE_PROVIDER_SITE_OTHER): Payer: Medicare Other | Admitting: Pulmonary Disease

## 2012-08-04 VITALS — BP 126/72 | HR 80 | Temp 98.2°F | Ht 67.0 in | Wt 201.8 lb

## 2012-08-04 DIAGNOSIS — R0902 Hypoxemia: Secondary | ICD-10-CM

## 2012-08-04 DIAGNOSIS — J449 Chronic obstructive pulmonary disease, unspecified: Secondary | ICD-10-CM

## 2012-08-04 DIAGNOSIS — G4733 Obstructive sleep apnea (adult) (pediatric): Secondary | ICD-10-CM

## 2012-08-04 MED ORDER — FUROSEMIDE 40 MG PO TABS: ORAL_TABLET | ORAL | Status: DC

## 2012-08-04 MED ORDER — PREDNISONE 5 MG PO TABS: ORAL_TABLET | ORAL | Status: DC

## 2012-08-04 NOTE — Assessment & Plan Note (Signed)
He is to continue 4 liters oxygen 24/7 for now >> re-assess at next visit.  Also concerned his home oxygen tubing may be too long, and thus not providing sufficient flow.  Will have his DME assess his home oxygen set up.

## 2012-08-04 NOTE — Assessment & Plan Note (Signed)
Will get copy of his BiPAP report and call him with results.

## 2012-08-04 NOTE — Addendum Note (Signed)
Addended by: Tommie Sams on: 08/04/2012 02:12 PM   Modules accepted: Orders

## 2012-08-04 NOTE — Progress Notes (Signed)
Chief Complaint  Patient presents with  . Acute Visit    RB pt. Pt reports breathing is worse--very SOB, slight cough w/ white phlem (taking mucinex), chest tx but no wheezing. He is using 4 liiters pulse O2.    History of Present Illness: [name] BITHER Sr. is a 79 y.o. male with severe COPD, OSA, and chronic hypoxemia. He also has a history of non-small cell lung cancer, status post right upper lobectomy and adjuvant chemotherapy in 2006.   He is followed by Dr. [name].  He has been feeling more short of breath for the past 3 weeks.  He has more cough with clear sputum.  He is getting chest tightness.  He is now using 4 liters oxygen.  He gets winded with minimal activity, and this is worse than before.  He denies fever or hemoptysis.  He has leg swelling, but no worse than usual and he takes lasix daily.  He uses symbicort bid, and spiriva qd.  He uses albuterol at least once per day >> this helps, but he is needing to use it more often.  He uses his BiPAP every night.  His wife is worried about his breathing while asleep also.  His home oxygen set up has oxygen tube that is about 70 feet long.     [name] Newcomer Sr.  has a past medical history of Stricture and stenosis of esophagus (12/07/2004); GERD (gastroesophageal reflux disease); Hypoxemia; Increased prostate specific antigen (PSA) velocity (02/26/2011); DM (diabetes mellitus) (02/26/2011); Gout (02/26/2011); COPD (chronic obstructive pulmonary disease); OSA (obstructive sleep apnea) (02/26/2011); Atrial fibrillation; Depression; Cardiac arrest (2009); Coronary artery disease; Hypertension; HTN (hypertension) (02/26/2011); Pacemaker; Cardiac arrest (2009); Cancer; Basal cell carcinoma of nose (02/26/2011); Oxygen dependent; ETOH abuse (01/29/2012); and CKD (chronic kidney disease) (02/28/2012).  [name] Newcomer Sr.  has past surgical history that includes Lung lobectomy; Knee arthroscopy; Visual merchandiser; Tonsillectomy; Hernia repair; Cardiac  catheterization; and Esophagogastroduodenoscopy (10/25/2011).  Prior to Admission medications   Medication Sig Start Date End Date Taking? Authorizing Provider  albuterol (PROVENTIL HFA;VENTOLIN HFA) 108 (90 BASE) MCG/ACT inhaler Inhale 2 puffs into the lungs every 6 (six) hours as needed for wheezing. 02/27/12 02/26/13  Corwin Levins, MD  allopurinol (ZYLOPRIM) 100 MG tablet Take 1 tablet (100 mg total) by mouth daily. 05/14/12   Corwin Levins, MD  ALPRAZolam Prudy Feeler) 0.5 MG tablet Take 1 tablet (0.5 mg total) by mouth 3 (three) times daily as needed. For anxiety/sleeplessness. 04/28/12   Corwin Levins, MD  aspirin 81 MG tablet Take 81 mg by mouth daily.      Historical Provider, MD  atorvastatin (LIPITOR) 40 MG tablet Take 40 mg by mouth at bedtime. 02/27/12   Corwin Levins, MD  budesonide-formoterol [facility]) 160-4.5 MCG/ACT inhaler Inhale 2 puffs into the lungs 2 (two) times daily. 02/27/12 02/26/13  Corwin Levins, MD  citalopram (CELEXA) 10 MG tablet Take 1 tablet (10 mg total) by mouth daily. 06/17/12 06/17/13  Corwin Levins, MD  colchicine 0.6 MG tablet Take 1 tablet (0.6 mg total) by mouth daily as needed. For gout flares 02/27/12   Corwin Levins, MD  diltiazem [facility]) 240 MG 24 hr capsule Take 1 capsule (240 mg total) by mouth daily. 02/27/12   Corwin Levins, MD  fish oil-omega-3 fatty acids 1000 MG capsule Take 2 g by mouth daily.      Historical Provider, MD  furosemide (LASIX) 40 MG tablet To take 3 tabs by mouth  in the AM, and 1 in the PM 02/27/12   Corwin Levins, MD  hydrALAZINE (APRESOLINE) 50 MG tablet Take 1 tablet (50 mg total) by mouth 2 (two) times daily. 07/13/12   Corwin Levins, MD  HYDROcodone-acetaminophen (NORCO) 7.5-325 MG per tablet Take 1 tablet by mouth every 6 (six) hours as needed for pain. 02/27/12   Corwin Levins, MD  hydroxypropyl methylcellulose (ISOPTO TEARS) 2.5 % ophthalmic solution Place 2 drops into both eyes as needed. For dry/irritated eyes.    Historical Provider, MD   magnesium oxide (DIASENSE MAGNESIUM) 400 (241.3 MG) MG tablet Take 1 tablet (400 mg total) by mouth daily. 02/27/12   Corwin Levins, MD  metoprolol (LOPRESSOR) 50 MG tablet Take 1 tablet (50 mg total) by mouth 2 (two) times daily. 02/27/12   Corwin Levins, MD  Multiple Vitamins-Minerals (EYE VITAMINS) TABS Take 1 tablet by mouth 2 (two) times daily.      Historical Provider, MD  multivitamin [facility]) per tablet Take 1 tablet by mouth daily.      Historical Provider, MD  omeprazole (PRILOSEC) 20 MG capsule Take 1 tablet by mouth once daily. PHARMACY-PLEASE D/C NEXIUM RX...TOO EXPENSIVE FOR PATIENT. 04/23/12   Hart Carwin, MD  potassium chloride SA (K-DUR,KLOR-CON) 20 MEQ tablet Take 1 tablet (20 mEq total) by mouth daily. 02/27/12   Corwin Levins, MD  predniSONE (DELTASONE) 10 MG tablet Take 2 tablets by mouth daily for five days 07/09/12   Corwin Levins, MD  temazepam (RESTORIL) 30 MG capsule Take 1 capsule (30 mg total) by mouth at bedtime. 02/27/12 11/28/13  Corwin Levins, MD  tiotropium (SPIRIVA) 18 MCG inhalation capsule Place 1 capsule (18 mcg total) into inhaler and inhale daily. 02/27/12 02/26/13  Corwin Levins, MD  vitamin B-12 (CYANOCOBALAMIN) 100 MCG tablet Take 50 mcg by mouth daily.      Historical Provider, MD  vitamin E 600 UNIT capsule Take 600 Units by mouth 2 (two) times daily.      Historical Provider, MD    No Known Allergies   Physical Exam:  General - No distress, sitting in wheel chair, wearing oxygen ENT - No sinus tenderness, no oral exudate, no LAN Cardiac - s1s2 regular, no murmur Chest - Decreased breath sounds, scattered rhonchi, no wheeze/rales Back - No focal tenderness Abd - Soft, non-tender Ext - Rt > Lt 1+ ankle edema Neuro - Normal strength Skin - No rashes Psych - normal mood, and behavior   Assessment/Plan:  Coralyn Helling, MD [facility] Pager:  [phone]

## 2012-08-04 NOTE — Assessment & Plan Note (Signed)
Will give him course of prednisone, and have him continue 5 mg daily until he is re-assessed by Dr. [name].  I don't think he needs CXR or Abx at present.  May also need to consider addition of daliresp, and transition to nebulized medications >> defer decision to Dr. [name].

## 2012-08-04 NOTE — Patient Instructions (Signed)
Prednisone 5 mg pills >> 4 pills daily for 2 days, 3 pills daily for 2 days, 2 pills daily for 2 days, then 1 pill daily until next visit with Dr. [name] Will get report from BiPAP machine Will check your oxygen set up Follow up with Dr. [name] in 2 weeks

## 2012-08-06 ENCOUNTER — Telehealth: Payer: Self-pay | Admitting: Emergency Medicine

## 2012-08-06 NOTE — Telephone Encounter (Signed)
Does the pcc's need to do anything with this it was in our box

## 2012-08-06 NOTE — Telephone Encounter (Signed)
I spoke w/ spouse and she stated they have not heard from [facility] regarding assessing pt home O2. Order was giving to [facility] 08/04/12. I advised will check on this  I called Melissa and she is going to check on this and call us back

## 2012-08-06 NOTE — Telephone Encounter (Signed)
Download order was sent to [facility] according to Epic, not sure why this was sent to [facility]? Per note pt is aware, nothing further needed. Carron Curie, CMA

## 2012-08-06 NOTE — Telephone Encounter (Signed)
I spoke with [facility] and they just needed clarification on what is needed for the home assessment. I advised her of the following: Hypoxemia - Coralyn Helling, MD at 08/04/2012 2:00 PM   Status: Written Related Problem: Hypoxemia        He is to continue 4 liters oxygen 24/7 for now >> re-assess at next visit.  Also concerned his home oxygen tubing may be too long, and thus not providing sufficient flow. Will have his DME assess his home oxygen set up.   Melissa voiced her understanding and will get this taking care of. I made spouse aware as well.  She stated pt does not get his BIPAP through them so can not do anything about the download. I show pt uses [facility] for this and will check with PCC's to see if this was sent to [facility]. Please advise thanks

## 2012-08-07 ENCOUNTER — Telehealth: Payer: Self-pay | Admitting: Emergency Medicine

## 2012-08-07 NOTE — Telephone Encounter (Signed)
Noted and will forward to Dr. [name] so he is aware

## 2012-08-07 NOTE — Telephone Encounter (Signed)
Ok thanks 

## 2012-08-13 ENCOUNTER — Ambulatory Visit: Payer: Medicare Other | Admitting: Internal Medicine

## 2012-08-14 ENCOUNTER — Other Ambulatory Visit: Payer: Self-pay | Admitting: *Deleted

## 2012-08-14 DIAGNOSIS — R079 Chest pain, unspecified: Secondary | ICD-10-CM

## 2012-08-14 MED ORDER — NITROGLYCERIN 0.4 MG SL SUBL: 0.4000 mg | SUBLINGUAL_TABLET | SUBLINGUAL | Status: AC | PRN

## 2012-08-14 NOTE — Telephone Encounter (Signed)
Wife phoned patient needed refill of NTG, sent to pharmacy

## 2012-08-18 ENCOUNTER — Encounter: Payer: Self-pay | Admitting: Emergency Medicine

## 2012-08-18 ENCOUNTER — Ambulatory Visit (INDEPENDENT_AMBULATORY_CARE_PROVIDER_SITE_OTHER): Payer: Medicare Other | Admitting: Emergency Medicine

## 2012-08-18 VITALS — BP 124/82 | HR 91 | Temp 98.0°F | Ht 67.0 in | Wt 204.8 lb

## 2012-08-18 DIAGNOSIS — J449 Chronic obstructive pulmonary disease, unspecified: Secondary | ICD-10-CM

## 2012-08-18 DIAGNOSIS — G4733 Obstructive sleep apnea (adult) (pediatric): Secondary | ICD-10-CM

## 2012-08-18 NOTE — Patient Instructions (Addendum)
Please continue your inhaled medications as you are taking them  Wear your oxygen at all times Continue your Prednisone 5mg  daily Use your BiPAP every night Follow with Dr [name] in 2 months or sooner if you have any problems.

## 2012-08-18 NOTE — Assessment & Plan Note (Signed)
Has benefited from chronic pred, will contineu low dose and follow for any side effects.  - same BD's - pred 5 - O2 at 4L/min - consider daliresp in the future

## 2012-08-18 NOTE — Progress Notes (Signed)
Subjective:    Patient ID: [name] Newcomer Sr., male    DOB: May 17, 1932, 79 y.o.   MRN: [id number] HPI 79 yo man, with severe COPD and chronic hypoxemia. He also has a history of non-small cell lung cancer, status post right upper lobectomy and adjuvant chemotherapy in 2006.   ROV 10/25/09 -- COPD and OSA. Tells me has been feeling well. Last Ct scan showed no evidence recurrance of his lung CA. Breathing has been OK, the heat has limited his activity. Last time we changed Advair to Symbicort - has helped his irritated throat, has also made his breathing a bit better. Never had to stop his Lisinopril. No new problems, no exacerbations.   ROV 05/04/10 -- Hx COPD and OSA, also hx NSCLCA and RUL lobectomy. Reports that he was treated for AE-COPD with abx and pred. Has been on Spiriva and Symbicort, cut back on the symbicort due to cost. Has been using once daily instead of two times a day. Had the pneumovax 7 yrs ago, shouldn't tneed it again. Having CT scans of the chest followed by Dr Welton Flakes, annually. Wears CPAP every night.   ROV 10/19/10 -- COPD, OSA, NSCLCA s/p RUL lobectomy. He had been getting CT scans annually, planning to see Dr Welton Flakes. May be having some decreased energy since February. He has been having difficulty with his swallowing - some dysphagia, food getting stuck. Sometimes causes him to get choked. Wears CPAP mask reliably.  Only using Symbicort daily, Spiriva.   03/20/2011 Pt ill for one week.  Rx levaquin x 5days 750/d. Not improved.  Severe dyspnea at rest. Not able to walk. No real chest pain.  Notes more wheezing.  Severe dyspnea even at rest.  No real fever T99.7  At home.  Did get flu vaccine in November. >>RX Avelox x 7 days , steroid taper.XR w/ no acute changes   Follow up  Pt returns for a 1 week follow up for COPD flare. Seen in office last week, tx with Avelox and steroid taper. CXR with no acute process last ov.  Feeling some better but still has a lot of coughing and  congestion  Finished Avelox yesterday. Has few days left on prednisone.  No hemotpysis or chest pain .   ROV 04/18/11 -- Severe COPD, OSA, NSCLCA s/p RUL lobectomy. He was seen beginning of the month for an exacerbation following probable URI, CXR showed no infiltrates. He was treated w steroids and abx. He is improved, close to baseline. Using Spiriva and Symbicort.   ROV 06/05/11 -- Severe COPD, OSA, NSCLCA s/p RUL lobectomy. He still hasn't gotten back to prior baseline since URI and AE. He is on 3L/min pulsed. He does exert but w difficulty. He is on Spiriva + Symbicort.   ROV 08/29/11 -- Severe COPD, OSA, NSCLCA s/p RUL lobectomy. He has remained active, still limited w exertion. He is on Spiriva + Symbicort. No exacerbations or pred, no abx. No cough or wheeze. He is having more LE edema, has started on lasix. Wears his CPAP reliably  ROV 11/28/11 -- Severe COPD, OSA, NSCLCA s/p RUL lobectomy. He has been doing well, but probably less active this year. He and his wife have worried about his oxygen, the tanks. Once they ran out on a trip from [place] and they were panicked. He hasn't had any exacerbations, hospitalizations. Remains on Spiriva + Symbicort. He is having gout flare L great toe. He had EGD for anemia eval >> reassuring.   01/14/12  Acute  Productive cough, increased SOB, wheezing, tightness in chest onset today, began with head congestion w/ clear drainage x1 week -  Cough is getting worse .  otc cold meds not working No hemoptysis or chest pain  No edema.   ROV 02/07/12 -- Severe COPD, OSA, NSCLCA s/p RUL lobectomy. Also with hx CAD, HTN, A Fib, diastolic CHF. He was admitted last week for cellulitis vs septic arthritis (no fluid able to be aspirated). He was d/c on his same BD's, NIPPV, abx (finished).   ROV 06/01/12 --  Severe COPD, OSA, NSCLCA s/p RUL lobectomy. Also with hx CAD, HTN, A Fib, diastolic CHF. Having thick white phlegm, taking mucinex. Since last visit he has been  evaluated by renal. No AE since last time. Wearing 3L/min, on Spiriva + Symbicort. Uses SABA 2-3x a week.   ROV 08/18/12 -- Severe COPD, OSA on BiPAP, NSCLCA s/p RUL lobectomy. Was seen by Dr Craige Cotta 5/20 for progressive dyspnea over 3-4 weeks. Treated with pred and taper down to 5mg  qd.  He improved with prednisone. Feels that he is doing better. Good compliance w BiPAP.       Objective:   Physical Exam Filed Vitals:   08/18/12 1337  BP: 124/82  Pulse: 91  Temp: 98 F (36.7 C)  TempSrc: Oral  Height: 5\' 7"  (1.702 m)  Weight: 204 lb 12.8 oz (92.897 kg)  SpO2: 92%   GEN: A/Ox3; pleasant , NAD  HEENT:  [place]/AT,  EACs-clear, TMs-wnl, NOSE-clear drainage, THROAT-clear, no lesions, no postnasal drip or exudate noted.   NECK:  Supple w/ fair ROM; no JVD; normal carotid impulses w/o bruits; no thyromegaly or nodules palpated; no lymphadenopathy.  RESP  no accessory muscle use, prolonged expiration, no crackles, no wheezing  CARD:  RRR, no m/r/g  , no peripheral edema, pulses intact, no cyanosis or clubbing.  Musco: Warm bil, 1+ RLE edema, trace LLE edema  Neuro: alert, no focal deficits noted.    Skin: Warm, no lesions or rashes   CT scan 02/05/12 --  Comparison: 08/07/2007  CT CHEST  Findings: Lungs/pleura: There is no pleural effusion. Mild  changes of centrilobular emphysema identified. Status post right  lower lobectomy. Focal area of subpleural scar is identified in  the right upper lobe, image 15. New from previous exam. No  specific features identified to suggest residual or recurrence of  tumor.  Heart/Mediastinum: There is a left chest wall porta-catheter with  tip in the cavoatrial junction. Right chest wall pacer device is  noted with tips in the right atrial appendage and right ventricle.  Calcified atherosclerotic disease affects the LAD, RCA and left  circumflex coronary arteries. There is no mediastinal or hilar  adenopathy identified.  Bones/Musculoskeletal: There  is no axillary or supraclavicular  adenopathy. Postoperative changes involving the right posterior  ribs identified. Chronic healed left posterior rib fracture  deformity also noted. There are no aggressive lytic or sclerotic  bone lesions identified.  IMPRESSION:  1. Essentially stable examination without specific features to  suggest residual or recurrence of tumor.  2. Prior right lower lobectomy.  3. Coronary artery calcifications.      Assessment & Plan:  OSA (obstructive sleep apnea) Continue BiPAP  CHRONIC OBSTRUCTIVE PULMONARY DISEASE, SEVERE Has benefited from chronic pred, will contineu low dose and follow for any side effects.  - same BD's - pred 5 - O2 at 4L/min - consider daliresp in the future

## 2012-08-18 NOTE — Assessment & Plan Note (Signed)
Continue BiPAP.  

## 2012-08-25 ENCOUNTER — Encounter: Payer: Self-pay | Admitting: Internal Medicine

## 2012-08-25 ENCOUNTER — Other Ambulatory Visit (INDEPENDENT_AMBULATORY_CARE_PROVIDER_SITE_OTHER): Payer: Medicare Other

## 2012-08-25 ENCOUNTER — Ambulatory Visit (INDEPENDENT_AMBULATORY_CARE_PROVIDER_SITE_OTHER): Payer: Medicare Other | Admitting: Internal Medicine

## 2012-08-25 VITALS — BP 114/72 | HR 76 | Temp 99.3°F | Wt 205.0 lb

## 2012-08-25 DIAGNOSIS — Z0000 Encounter for general adult medical examination without abnormal findings: Secondary | ICD-10-CM

## 2012-08-25 DIAGNOSIS — L02419 Cutaneous abscess of limb, unspecified: Secondary | ICD-10-CM

## 2012-08-25 DIAGNOSIS — L03116 Cellulitis of left lower limb: Secondary | ICD-10-CM | POA: Insufficient documentation

## 2012-08-25 DIAGNOSIS — E119 Type 2 diabetes mellitus without complications: Secondary | ICD-10-CM

## 2012-08-25 LAB — CBC WITH DIFFERENTIAL/PLATELET
Basophils Relative: 0.2 % (ref 0.0–3.0)
Eosinophils Relative: 0 % (ref 0.0–5.0)
HCT: 38.2 % — ABNORMAL LOW (ref 39.0–52.0)
Lymphs Abs: 1.2 10*3/uL (ref 0.7–4.0)
MCV: 96.4 fl (ref 78.0–100.0)
Monocytes Absolute: 0.8 10*3/uL (ref 0.1–1.0)
Neutro Abs: 12.6 10*3/uL — ABNORMAL HIGH (ref 1.4–7.7)
Platelets: 278 10*3/uL (ref 150.0–400.0)
WBC: 14.6 10*3/uL — ABNORMAL HIGH (ref 4.5–10.5)

## 2012-08-25 LAB — URINALYSIS, ROUTINE W REFLEX MICROSCOPIC
Ketones, ur: NEGATIVE
Specific Gravity, Urine: 1.02 (ref 1.000–1.030)
Total Protein, Urine: >=300
Urine Glucose: NEGATIVE
pH: 5.5 (ref 5.0–8.0)

## 2012-08-25 LAB — BASIC METABOLIC PANEL
BUN: 33 mg/dL — ABNORMAL HIGH (ref 6–23)
GFR: 27.98 mL/min — ABNORMAL LOW (ref >60.00)
Potassium: 4.1 mEq/L (ref 3.5–5.1)
Sodium: 139 mEq/L (ref 135–145)

## 2012-08-25 LAB — LIPID PANEL
Cholesterol: 156 mg/dL (ref 0–200)
LDL Cholesterol: 84 mg/dL (ref 0–99)
VLDL: 18.8 mg/dL (ref 0.0–40.0)

## 2012-08-25 LAB — HEPATIC FUNCTION PANEL
AST: 23 U/L (ref 0–37)
Total Bilirubin: 0.8 mg/dL (ref 0.3–1.2)

## 2012-08-25 LAB — HEMOGLOBIN A1C: Hgb A1c MFr Bld: 6.4 % (ref 4.6–6.5)

## 2012-08-25 MED ORDER — DOXYCYCLINE HYCLATE 100 MG PO TABS: 100.0000 mg | ORAL_TABLET | Freq: Two times a day (BID) | ORAL | Status: DC

## 2012-08-25 MED ORDER — HYDROCODONE-ACETAMINOPHEN 7.5-325 MG PO TABS: 1.0000 | ORAL_TABLET | Freq: Four times a day (QID) | ORAL | Status: DC | PRN

## 2012-08-25 NOTE — Progress Notes (Signed)
Subjective:    Patient ID: [name] Newcomer Sr., male    DOB: 1932-05-20, 79 y.o.   MRN: [id number]  HPI  Here for wellness and f/u with wife who helps with hx;  Overall doing ok;  Pt denies CP, recent worsening SOB, DOE, wheezing, orthopnea, PND, worsening LE edema, palpitations, dizziness or syncope.  Pt denies neurological change such as new headache, facial or extremity weakness.  Pt denies polydipsia, polyuria, or low sugar symptoms. Pt states overall good compliance with treatment and medications, good tolerability, and has been trying to follow lower cholesterol diet.  Pt denies worsening depressive symptoms, suicidal ideation or panic. No fever, night sweats, wt loss, loss of appetite, or other constitutional symptoms.  Pt states good ability with ADL's, has low to mod fall risk, home safety reviewed and adequate, no other significant changes in hearing or vision, and only occasionally active with exercise.  Does have an area of tender erythema to left lateral ankle for several days without fever, drainage, red streaks, but gradually worsening Past Medical History  Diagnosis Date  . Stricture and stenosis of esophagus 12/07/2004    EGD done on 12/07/2004  . GERD (gastroesophageal reflux disease)   . Hypoxemia   . Increased prostate specific antigen (PSA) velocity 02/26/2011  . DM (diabetes mellitus) 02/26/2011  . Gout 02/26/2011  . COPD (chronic obstructive pulmonary disease)     oxygen at home, 3L 24/7  . OSA (obstructive sleep apnea) 02/26/2011    CPAP  . Atrial fibrillation   . Depression   . Cardiac arrest 2009    while in hospital   . Coronary artery disease     non obstructive cath 2006  . Hypertension   . HTN (hypertension) 02/26/2011  . Pacemaker   . Cardiac arrest 2009    while in hospital  . Cancer     lung s/p lobectomy x2 on RT lung  . Basal cell carcinoma of nose 02/26/2011  . Oxygen dependent   . ETOH abuse 01/29/2012    started drinking again - hx cardiac arrest  in 2009 due to dt's with organ failure  . CKD (chronic kidney disease) 02/28/2012   Past Surgical History  Procedure Laterality Date  . Lung lobectomy      x2 lobes on RT lung  . Knee arthroscopy      bilateral  . Pace maker    . Tonsillectomy    . Hernia repair      x2, esophageal  . Cardiac catheterization    . Esophagogastroduodenoscopy  10/25/2011    Procedure: ESOPHAGOGASTRODUODENOSCOPY (EGD);  Surgeon: Hart Carwin, MD;  Location: [facility];  Service: Endoscopy;  Laterality: N/A;    reports that he quit smoking about 16 years ago. His smoking use included Cigarettes. He has a 75 pack-year smoking history. His smokeless tobacco use includes Chew. He reports that  drinks alcohol. He reports that he does not use illicit drugs. family history includes Cancer in his other; Heart disease in his father and other; Hypertension in his other; and Lung cancer in his mother. No Known Allergies Current Outpatient Prescriptions on File Prior to Visit  Medication Sig Dispense Refill  . albuterol (PROVENTIL HFA;VENTOLIN HFA) 108 (90 BASE) MCG/ACT inhaler Inhale 2 puffs into the lungs every 6 (six) hours as needed for wheezing.  1 Inhaler  6  . allopurinol (ZYLOPRIM) 100 MG tablet Take 1 tablet (100 mg total) by mouth daily.  90 tablet  3  . ALPRAZolam Prudy Feeler)  0.5 MG tablet Take 1 tablet (0.5 mg total) by mouth 3 (three) times daily as needed. For anxiety/sleeplessness.  90 tablet  3  . aspirin 81 MG tablet Take 81 mg by mouth daily.        Marland Kitchen atorvastatin (LIPITOR) 40 MG tablet Take 40 mg by mouth at bedtime.      . budesonide-formoterol (SYMBICORT) 160-4.5 MCG/ACT inhaler Inhale 2 puffs into the lungs 2 (two) times daily.  3 Inhaler  3  . citalopram (CELEXA) 10 MG tablet Take 1 tablet (10 mg total) by mouth daily.  90 tablet  3  . colchicine 0.6 MG tablet Take 1 tablet (0.6 mg total) by mouth daily as needed. For gout flares  90 tablet  3  . diltiazem (TIAZAC) 240 MG 24 hr capsule Take 1  capsule (240 mg total) by mouth daily.  90 capsule  3  . fish oil-omega-3 fatty acids 1000 MG capsule Take 2 g by mouth daily.        . furosemide (LASIX) 40 MG tablet To take 3 tabs by mouth in the AM, and 2-3 in the PM  120 tablet  1  . hydrALAZINE (APRESOLINE) 50 MG tablet Take 1 tablet (50 mg total) by mouth 2 (two) times daily.  180 tablet  3  . hydroxypropyl methylcellulose (ISOPTO TEARS) 2.5 % ophthalmic solution Place 2 drops into both eyes as needed. For dry/irritated eyes.      . magnesium oxide (DIASENSE MAGNESIUM) 400 (241.3 MG) MG tablet Take 1 tablet (400 mg total) by mouth daily.  90 tablet  3  . metoprolol (LOPRESSOR) 50 MG tablet Take 1 tablet (50 mg total) by mouth 2 (two) times daily.  180 tablet  3  . Multiple Vitamins-Minerals (EYE VITAMINS) TABS Take 1 tablet by mouth 2 (two) times daily.        . multivitamin (THERAGRAN) per tablet Take 1 tablet by mouth daily.        . nitroGLYCERIN (NITROSTAT) 0.4 MG SL tablet Place 1 tablet (0.4 mg total) under the tongue every 5 (five) minutes as needed for chest pain.  25 tablet  prn  . omeprazole (PRILOSEC) 20 MG capsule Take 1 tablet by mouth once daily. PHARMACY-PLEASE D/C NEXIUM RX...TOO EXPENSIVE FOR PATIENT.  90 capsule  1  . potassium chloride SA (K-DUR,KLOR-CON) 20 MEQ tablet Take 1 tablet (20 mEq total) by mouth daily.  90 tablet  3  . predniSONE (DELTASONE) 5 MG tablet Take 5 mg by mouth daily.      . temazepam (RESTORIL) 30 MG capsule Take 1 capsule (30 mg total) by mouth at bedtime.  90 capsule  1  . tiotropium (SPIRIVA) 18 MCG inhalation capsule Place 1 capsule (18 mcg total) into inhaler and inhale daily.  90 capsule  3  . vitamin B-12 (CYANOCOBALAMIN) 100 MCG tablet Take 50 mcg by mouth daily.        . vitamin E 600 UNIT capsule Take 600 Units by mouth 2 (two) times daily.         No current facility-administered medications on file prior to visit.   Review of Systems Somewhat limited due to dementia Constitutional:  Negative for diaphoresis, activity change, appetite change or unexpected weight change.  HENT: Negative for hearing loss, ear pain, facial swelling, mouth sores and neck stiffness.   Eyes: Negative for pain, redness and visual disturbance.  Respiratory: Negative for shortness of breath and wheezing.   Cardiovascular: Negative for chest pain and palpitations.  Gastrointestinal: Negative for diarrhea, blood in stool, abdominal distention or other pain Genitourinary: Negative for hematuria, flank pain or change in urine volume.  Musculoskeletal: Negative for myalgias and joint swelling.  Skin: Negative for color change and wound.  Neurological: Negative for syncope and numbness. other than noted Hematological: Negative for adenopathy.  Psychiatric/Behavioral: Negative for hallucinations, self-injury, decreased concentration and agitation.      Objective:   Physical Exam BP 114/72  Pulse 76  Temp(Src) 99.3 F (37.4 C) (Oral)  Wt 205 lb (92.987 kg)  BMI 32.1 kg/m2  SpO2 93% VS noted,  Constitutional: Pt appears well-developed and well-nourished.  HENT: Head: NCAT.  Right Ear: External ear normal.  Left Ear: External ear normal.  Eyes: Conjunctivae and EOM are normal. Pupils are equal, round, and reactive to light.  Neck: Normal range of motion. Neck supple.  Cardiovascular: Normal rate and regular rhythm.   Pulmonary/Chest: Effort normal and breath sounds decreased, no rales.  Abd:  Soft, NT, non-distended, + BS Neurological: Pt is alert. Has baseline confusion Skin: Skin is warm. Left lateral ankle with 1.5 cm area red, tender indurated but nonfluctuant or draining Psychiatric: Pt behavior is normal.Not agitated or depressed affect     Assessment & Plan:

## 2012-08-25 NOTE — Assessment & Plan Note (Signed)
Mild to mod, for antibx course,  to f/u any worsening symptoms or concerns 

## 2012-08-25 NOTE — Assessment & Plan Note (Signed)

## 2012-08-25 NOTE — Patient Instructions (Addendum)
Please take all new medication as prescribed - the antibiotic (sent to [facility]) Please continue all other medications as before, and refills have been done if requested. Please have the [facility] pharmacy call with any other refills you may need. Please go to the LAB in the Basement (turn left off the elevator) for the tests to be done today You will be contacted by phone if any changes need to be made immediately.  Otherwise, you will receive a letter about your results with an explanation  Please remember to sign up for My Chart if you have not done so, as this will be important to you in the future with finding out test results, communicating by private email, and scheduling acute appointments online when needed.  Please return in 6 months, or sooner if needed

## 2012-08-26 ENCOUNTER — Ambulatory Visit: Payer: Medicare Other | Admitting: Emergency Medicine

## 2012-08-26 ENCOUNTER — Encounter: Payer: Self-pay | Admitting: Internal Medicine

## 2012-08-27 ENCOUNTER — Emergency Department (HOSPITAL_COMMUNITY): Payer: Medicare Other

## 2012-08-27 ENCOUNTER — Inpatient Hospital Stay (HOSPITAL_COMMUNITY)
Admission: EM | Admit: 2012-08-27 | Discharge: 2012-09-16 | DRG: 177 | Disposition: A | Discharge status: Home Health | Payer: Medicare Other | Attending: Internal Medicine | Admitting: Internal Medicine

## 2012-08-27 ENCOUNTER — Encounter (HOSPITAL_COMMUNITY): Payer: Self-pay | Admitting: Emergency Medicine

## 2012-08-27 DIAGNOSIS — R972 Elevated prostate specific antigen [PSA]: Secondary | ICD-10-CM

## 2012-08-27 DIAGNOSIS — F419 Anxiety disorder, unspecified: Secondary | ICD-10-CM

## 2012-08-27 DIAGNOSIS — L02419 Cutaneous abscess of limb, unspecified: Secondary | ICD-10-CM | POA: Diagnosis present

## 2012-08-27 DIAGNOSIS — J449 Chronic obstructive pulmonary disease, unspecified: Secondary | ICD-10-CM

## 2012-08-27 DIAGNOSIS — F32A Depression, unspecified: Secondary | ICD-10-CM

## 2012-08-27 DIAGNOSIS — I495 Sick sinus syndrome: Secondary | ICD-10-CM

## 2012-08-27 DIAGNOSIS — E790 Hyperuricemia without signs of inflammatory arthritis and tophaceous disease: Secondary | ICD-10-CM

## 2012-08-27 DIAGNOSIS — K921 Melena: Secondary | ICD-10-CM

## 2012-08-27 DIAGNOSIS — J441 Chronic obstructive pulmonary disease with (acute) exacerbation: Secondary | ICD-10-CM

## 2012-08-27 DIAGNOSIS — I5032 Chronic diastolic (congestive) heart failure: Secondary | ICD-10-CM

## 2012-08-27 DIAGNOSIS — G4733 Obstructive sleep apnea (adult) (pediatric): Secondary | ICD-10-CM

## 2012-08-27 DIAGNOSIS — J156 Pneumonia due to other aerobic Gram-negative bacteria: Secondary | ICD-10-CM | POA: Diagnosis present

## 2012-08-27 DIAGNOSIS — M109 Gout, unspecified: Secondary | ICD-10-CM

## 2012-08-27 DIAGNOSIS — G9340 Encephalopathy, unspecified: Secondary | ICD-10-CM

## 2012-08-27 DIAGNOSIS — I517 Cardiomegaly: Secondary | ICD-10-CM

## 2012-08-27 DIAGNOSIS — Z87891 Personal history of nicotine dependence: Secondary | ICD-10-CM

## 2012-08-27 DIAGNOSIS — G4700 Insomnia, unspecified: Secondary | ICD-10-CM | POA: Diagnosis present

## 2012-08-27 DIAGNOSIS — C44319 Basal cell carcinoma of skin of other parts of face: Secondary | ICD-10-CM | POA: Diagnosis present

## 2012-08-27 DIAGNOSIS — F1020 Alcohol dependence, uncomplicated: Secondary | ICD-10-CM

## 2012-08-27 DIAGNOSIS — N183 Chronic kidney disease, stage 3 unspecified: Secondary | ICD-10-CM

## 2012-08-27 DIAGNOSIS — E119 Type 2 diabetes mellitus without complications: Secondary | ICD-10-CM

## 2012-08-27 DIAGNOSIS — IMO0002 Reserved for concepts with insufficient information to code with codable children: Secondary | ICD-10-CM | POA: Diagnosis present

## 2012-08-27 DIAGNOSIS — E669 Obesity, unspecified: Secondary | ICD-10-CM | POA: Diagnosis present

## 2012-08-27 DIAGNOSIS — H3530 Unspecified macular degeneration: Secondary | ICD-10-CM

## 2012-08-27 DIAGNOSIS — F3289 Other specified depressive episodes: Secondary | ICD-10-CM | POA: Diagnosis present

## 2012-08-27 DIAGNOSIS — Z22322 Carrier or suspected carrier of Methicillin resistant Staphylococcus aureus: Secondary | ICD-10-CM

## 2012-08-27 DIAGNOSIS — E876 Hypokalemia: Secondary | ICD-10-CM | POA: Diagnosis present

## 2012-08-27 DIAGNOSIS — C3490 Malignant neoplasm of unspecified part of unspecified bronchus or lung: Secondary | ICD-10-CM

## 2012-08-27 DIAGNOSIS — J4489 Other specified chronic obstructive pulmonary disease: Secondary | ICD-10-CM

## 2012-08-27 DIAGNOSIS — N184 Chronic kidney disease, stage 4 (severe): Secondary | ICD-10-CM | POA: Diagnosis present

## 2012-08-27 DIAGNOSIS — K5900 Constipation, unspecified: Secondary | ICD-10-CM | POA: Diagnosis present

## 2012-08-27 DIAGNOSIS — J1569 Pneumonia due to other gram-negative bacteria: Principal | ICD-10-CM

## 2012-08-27 DIAGNOSIS — R0902 Hypoxemia: Secondary | ICD-10-CM

## 2012-08-27 DIAGNOSIS — Z0000 Encounter for general adult medical examination without abnormal findings: Secondary | ICD-10-CM

## 2012-08-27 DIAGNOSIS — J9620 Acute and chronic respiratory failure, unspecified whether with hypoxia or hypercapnia: Secondary | ICD-10-CM

## 2012-08-27 DIAGNOSIS — B029 Zoster without complications: Secondary | ICD-10-CM

## 2012-08-27 DIAGNOSIS — Z8674 Personal history of sudden cardiac arrest: Secondary | ICD-10-CM

## 2012-08-27 DIAGNOSIS — R748 Abnormal levels of other serum enzymes: Secondary | ICD-10-CM

## 2012-08-27 DIAGNOSIS — Z950 Presence of cardiac pacemaker: Secondary | ICD-10-CM

## 2012-08-27 DIAGNOSIS — C44311 Basal cell carcinoma of skin of nose: Secondary | ICD-10-CM

## 2012-08-27 DIAGNOSIS — I5021 Acute systolic (congestive) heart failure: Secondary | ICD-10-CM

## 2012-08-27 DIAGNOSIS — E1129 Type 2 diabetes mellitus with other diabetic kidney complication: Secondary | ICD-10-CM

## 2012-08-27 DIAGNOSIS — G8929 Other chronic pain: Secondary | ICD-10-CM

## 2012-08-27 DIAGNOSIS — I4891 Unspecified atrial fibrillation: Secondary | ICD-10-CM

## 2012-08-27 DIAGNOSIS — I10 Essential (primary) hypertension: Secondary | ICD-10-CM

## 2012-08-27 DIAGNOSIS — Z6830 Body mass index (BMI) 30.0-30.9, adult: Secondary | ICD-10-CM

## 2012-08-27 DIAGNOSIS — I5033 Acute on chronic diastolic (congestive) heart failure: Secondary | ICD-10-CM

## 2012-08-27 DIAGNOSIS — F329 Major depressive disorder, single episode, unspecified: Secondary | ICD-10-CM | POA: Diagnosis present

## 2012-08-27 DIAGNOSIS — I129 Hypertensive chronic kidney disease with stage 1 through stage 4 chronic kidney disease, or unspecified chronic kidney disease: Secondary | ICD-10-CM | POA: Diagnosis present

## 2012-08-27 DIAGNOSIS — R7989 Other specified abnormal findings of blood chemistry: Secondary | ICD-10-CM

## 2012-08-27 DIAGNOSIS — B372 Candidiasis of skin and nail: Secondary | ICD-10-CM

## 2012-08-27 DIAGNOSIS — D72829 Elevated white blood cell count, unspecified: Secondary | ICD-10-CM

## 2012-08-27 DIAGNOSIS — E1165 Type 2 diabetes mellitus with hyperglycemia: Secondary | ICD-10-CM | POA: Diagnosis present

## 2012-08-27 DIAGNOSIS — D649 Anemia, unspecified: Secondary | ICD-10-CM | POA: Diagnosis present

## 2012-08-27 DIAGNOSIS — Z7901 Long term (current) use of anticoagulants: Secondary | ICD-10-CM

## 2012-08-27 DIAGNOSIS — I119 Hypertensive heart disease without heart failure: Secondary | ICD-10-CM

## 2012-08-27 DIAGNOSIS — N058 Unspecified nephritic syndrome with other morphologic changes: Secondary | ICD-10-CM | POA: Diagnosis present

## 2012-08-27 DIAGNOSIS — R933 Abnormal findings on diagnostic imaging of other parts of digestive tract: Secondary | ICD-10-CM

## 2012-08-27 DIAGNOSIS — Z9221 Personal history of antineoplastic chemotherapy: Secondary | ICD-10-CM

## 2012-08-27 DIAGNOSIS — F1010 Alcohol abuse, uncomplicated: Secondary | ICD-10-CM | POA: Diagnosis present

## 2012-08-27 DIAGNOSIS — Z9981 Dependence on supplemental oxygen: Secondary | ICD-10-CM

## 2012-08-27 DIAGNOSIS — L03116 Cellulitis of left lower limb: Secondary | ICD-10-CM

## 2012-08-27 DIAGNOSIS — K219 Gastro-esophageal reflux disease without esophagitis: Secondary | ICD-10-CM

## 2012-08-27 DIAGNOSIS — I2510 Atherosclerotic heart disease of native coronary artery without angina pectoris: Secondary | ICD-10-CM

## 2012-08-27 DIAGNOSIS — R042 Hemoptysis: Secondary | ICD-10-CM

## 2012-08-27 DIAGNOSIS — E7800 Pure hypercholesterolemia, unspecified: Secondary | ICD-10-CM

## 2012-08-27 DIAGNOSIS — Z85118 Personal history of other malignant neoplasm of bronchus and lung: Secondary | ICD-10-CM

## 2012-08-27 DIAGNOSIS — J9819 Other pulmonary collapse: Secondary | ICD-10-CM | POA: Diagnosis present

## 2012-08-27 HISTORY — DX: Other ventricular tachycardia: I47.29

## 2012-08-27 HISTORY — DX: Ulcer of esophagus without bleeding: K22.10

## 2012-08-27 HISTORY — DX: Sick sinus syndrome: I49.5

## 2012-08-27 HISTORY — DX: Endocarditis, valve unspecified: I38

## 2012-08-27 HISTORY — DX: Paroxysmal atrial fibrillation: I48.0

## 2012-08-27 HISTORY — DX: Ventricular tachycardia: I47.2

## 2012-08-27 HISTORY — DX: Diaphragmatic hernia without obstruction or gangrene: K44.9

## 2012-08-27 HISTORY — DX: Atherosclerotic heart disease of native coronary artery without angina pectoris: I25.10

## 2012-08-27 HISTORY — DX: Bradycardia, unspecified: R00.1

## 2012-08-27 HISTORY — DX: Ventricular tachycardia, unspecified: I47.20

## 2012-08-27 HISTORY — DX: Chronic diastolic (congestive) heart failure: I50.32

## 2012-08-27 LAB — CBC WITH DIFFERENTIAL/PLATELET
Eosinophils Relative: 0 % (ref 0–5)
HCT: 36.1 % — ABNORMAL LOW (ref 39.0–52.0)
Lymphocytes Relative: 8 % — ABNORMAL LOW (ref 12–46)
Lymphs Abs: 1.7 10*3/uL (ref 0.7–4.0)
MCV: 93.5 fL (ref 78.0–100.0)
Monocytes Absolute: 2.2 10*3/uL — ABNORMAL HIGH (ref 0.1–1.0)
RDW: 16.1 % — ABNORMAL HIGH (ref 11.5–15.5)
WBC: 20.9 10*3/uL — ABNORMAL HIGH (ref 4.0–10.5)

## 2012-08-27 LAB — BASIC METABOLIC PANEL
BUN: 33 mg/dL — ABNORMAL HIGH (ref 6–23)
CO2: 28 mEq/L (ref 19–32)
Calcium: 8.8 mg/dL (ref 8.4–10.5)
Creatinine, Ser: 2.1 mg/dL — ABNORMAL HIGH (ref 0.50–1.35)
Glucose, Bld: 196 mg/dL — ABNORMAL HIGH (ref 70–99)

## 2012-08-27 LAB — TROPONIN I
Troponin I: 0.81 ng/mL (ref <0.30)
Troponin I: 1.36 ng/mL (ref <0.30)
Troponin I: 1.71 ng/mL (ref <0.30)

## 2012-08-27 LAB — GLUCOSE, CAPILLARY
Glucose-Capillary: 198 mg/dL — ABNORMAL HIGH (ref 70–99)
Glucose-Capillary: 289 mg/dL — ABNORMAL HIGH (ref 70–99)

## 2012-08-27 LAB — URINE MICROSCOPIC-ADD ON

## 2012-08-27 LAB — URINALYSIS, ROUTINE W REFLEX MICROSCOPIC
Bilirubin Urine: NEGATIVE
Glucose, UA: NEGATIVE mg/dL
Hgb urine dipstick: NEGATIVE
Protein, ur: >300 mg/dL — AB
Urobilinogen, UA: 0.2 mg/dL (ref 0.0–1.0)

## 2012-08-27 LAB — MRSA PCR SCREENING: MRSA by PCR: POSITIVE — AB

## 2012-08-27 LAB — PRO B NATRIURETIC PEPTIDE: Pro B Natriuretic peptide (BNP): 3385 pg/mL — ABNORMAL HIGH (ref 0–450)

## 2012-08-27 LAB — APTT: aPTT: 33 seconds (ref 24–37)

## 2012-08-27 LAB — HEMOGLOBIN A1C: Hgb A1c MFr Bld: 6.2 % — ABNORMAL HIGH (ref <5.7)

## 2012-08-27 LAB — PROTIME-INR: INR: 1.04 (ref 0.00–1.49)

## 2012-08-27 LAB — PROCALCITONIN: Procalcitonin: 1.49 ng/mL

## 2012-08-27 LAB — TSH: TSH: 0.925 u[IU]/mL (ref 0.350–4.500)

## 2012-08-27 MED ORDER — MAGNESIUM OXIDE 400 (241.3 MG) MG PO TABS
400.0000 mg | ORAL_TABLET | Freq: Every day | ORAL | Status: DC
  Administered 2012-08-27 – 2012-09-16 (×20): 400 mg via ORAL
  Filled 2012-08-27 (×21): qty 1

## 2012-08-27 MED ORDER — SODIUM CHLORIDE 0.9 % IJ SOLN
3.0000 mL | Freq: Two times a day (BID) | INTRAMUSCULAR | Status: DC
  Administered 2012-08-27 – 2012-09-14 (×15): 3 mL via INTRAVENOUS

## 2012-08-27 MED ORDER — ALUM & MAG HYDROXIDE-SIMETH 200-200-20 MG/5ML PO SUSP: 30.0000 mL | Freq: Four times a day (QID) | ORAL | Status: DC | PRN

## 2012-08-27 MED ORDER — PANTOPRAZOLE SODIUM 40 MG PO TBEC
40.0000 mg | DELAYED_RELEASE_TABLET | Freq: Every day | ORAL | Status: DC
  Administered 2012-08-27 – 2012-09-16 (×20): 40 mg via ORAL
  Filled 2012-08-27 (×18): qty 1

## 2012-08-27 MED ORDER — NITROGLYCERIN 0.4 MG SL SUBL: 0.4000 mg | SUBLINGUAL_TABLET | SUBLINGUAL | Status: DC | PRN

## 2012-08-27 MED ORDER — ONDANSETRON HCL 4 MG/2ML IJ SOLN: 4.0000 mg | Freq: Four times a day (QID) | INTRAMUSCULAR | Status: DC | PRN

## 2012-08-27 MED ORDER — METOPROLOL SUCCINATE ER 50 MG PO TB24
50.0000 mg | ORAL_TABLET | Freq: Two times a day (BID) | ORAL | Status: DC
  Administered 2012-08-27 – 2012-08-29 (×5): 50 mg via ORAL
  Filled 2012-08-27 (×6): qty 1

## 2012-08-27 MED ORDER — HYPROMELLOSE (GONIOSCOPIC) 2.5 % OP SOLN: 2.0000 [drp] | OPHTHALMIC | Status: DC | PRN

## 2012-08-27 MED ORDER — ASPIRIN 81 MG PO CHEW
CHEWABLE_TABLET | ORAL | Status: AC
  Administered 2012-08-27: 81 mg
  Filled 2012-08-27: qty 1

## 2012-08-27 MED ORDER — ONDANSETRON HCL 4 MG PO TABS: 4.0000 mg | ORAL_TABLET | Freq: Four times a day (QID) | ORAL | Status: DC | PRN

## 2012-08-27 MED ORDER — ALBUTEROL SULFATE (5 MG/ML) 0.5% IN NEBU
2.5000 mg | INHALATION_SOLUTION | Freq: Four times a day (QID) | RESPIRATORY_TRACT | Status: DC
  Administered 2012-08-27 – 2012-08-28 (×5): 2.5 mg via RESPIRATORY_TRACT
  Filled 2012-08-27 (×5): qty 0.5

## 2012-08-27 MED ORDER — ATORVASTATIN CALCIUM 40 MG PO TABS
40.0000 mg | ORAL_TABLET | Freq: Every day | ORAL | Status: DC
  Administered 2012-08-27 – 2012-09-15 (×20): 40 mg via ORAL
  Filled 2012-08-27 (×22): qty 1

## 2012-08-27 MED ORDER — HEPARIN SODIUM (PORCINE) 5000 UNIT/ML IJ SOLN
5000.0000 [IU] | Freq: Three times a day (TID) | INTRAMUSCULAR | Status: DC
  Administered 2012-08-27: 5000 [IU] via SUBCUTANEOUS
  Filled 2012-08-27 (×4): qty 1

## 2012-08-27 MED ORDER — LEVOFLOXACIN IN D5W 500 MG/100ML IV SOLN
500.0000 mg | Freq: Once | INTRAVENOUS | Status: AC
  Administered 2012-08-27: 500 mg via INTRAVENOUS
  Filled 2012-08-27: qty 100

## 2012-08-27 MED ORDER — MUPIROCIN 2 % EX OINT
1.0000 "application " | TOPICAL_OINTMENT | Freq: Two times a day (BID) | CUTANEOUS | Status: AC
  Administered 2012-08-27 – 2012-08-31 (×10): 1 via NASAL
  Filled 2012-08-27: qty 22

## 2012-08-27 MED ORDER — SODIUM CHLORIDE 0.9 % IV SOLN
INTRAVENOUS | Status: DC
  Administered 2012-08-27: 1000 mL via INTRAVENOUS

## 2012-08-27 MED ORDER — PIPERACILLIN-TAZOBACTAM 3.375 G IVPB
3.3750 g | Freq: Three times a day (TID) | INTRAVENOUS | Status: DC
  Administered 2012-08-27 – 2012-08-28 (×4): 3.375 g via INTRAVENOUS
  Filled 2012-08-27 (×7): qty 50

## 2012-08-27 MED ORDER — BUDESONIDE-FORMOTEROL FUMARATE 160-4.5 MCG/ACT IN AERO: 2.0000 | INHALATION_SPRAY | Freq: Two times a day (BID) | RESPIRATORY_TRACT | Status: DC

## 2012-08-27 MED ORDER — OXYCODONE HCL 5 MG PO TABS
5.0000 mg | ORAL_TABLET | ORAL | Status: DC | PRN
  Administered 2012-09-06 – 2012-09-15 (×5): 5 mg via ORAL
  Filled 2012-08-27 (×5): qty 1

## 2012-08-27 MED ORDER — FUROSEMIDE 10 MG/ML IJ SOLN
80.0000 mg | Freq: Three times a day (TID) | INTRAMUSCULAR | Status: DC
  Administered 2012-08-27: 80 mg via INTRAVENOUS
  Filled 2012-08-27: qty 8

## 2012-08-27 MED ORDER — INSULIN ASPART 100 UNIT/ML ~~LOC~~ SOLN
0.0000 [IU] | Freq: Three times a day (TID) | SUBCUTANEOUS | Status: DC
  Administered 2012-08-27: 3 [IU] via SUBCUTANEOUS
  Administered 2012-08-27: 2 [IU] via SUBCUTANEOUS
  Administered 2012-08-28 (×2): 1 [IU] via SUBCUTANEOUS
  Administered 2012-08-28: 2 [IU] via SUBCUTANEOUS
  Administered 2012-08-29: 1 [IU] via SUBCUTANEOUS
  Administered 2012-08-29: 2 [IU] via SUBCUTANEOUS

## 2012-08-27 MED ORDER — FUROSEMIDE 10 MG/ML IJ SOLN
60.0000 mg | Freq: Once | INTRAMUSCULAR | Status: AC
  Administered 2012-08-27: 60 mg via INTRAVENOUS
  Filled 2012-08-27: qty 6

## 2012-08-27 MED ORDER — ALBUTEROL SULFATE HFA 108 (90 BASE) MCG/ACT IN AERS
2.0000 | INHALATION_SPRAY | Freq: Four times a day (QID) | RESPIRATORY_TRACT | Status: DC | PRN
Start: 2012-08-27 — End: 2012-08-28
  Filled 2012-08-27: qty 6.7

## 2012-08-27 MED ORDER — CITALOPRAM HYDROBROMIDE 10 MG PO TABS
10.0000 mg | ORAL_TABLET | Freq: Every day | ORAL | Status: DC
  Administered 2012-08-27 – 2012-09-16 (×20): 10 mg via ORAL
  Filled 2012-08-27 (×21): qty 1

## 2012-08-27 MED ORDER — CHLORHEXIDINE GLUCONATE CLOTH 2 % EX PADS
6.0000 | MEDICATED_PAD | Freq: Every day | CUTANEOUS | Status: AC
  Administered 2012-08-27 – 2012-08-31 (×5): 6 via TOPICAL

## 2012-08-27 MED ORDER — IPRATROPIUM BROMIDE 0.02 % IN SOLN
0.5000 mg | Freq: Four times a day (QID) | RESPIRATORY_TRACT | Status: DC
  Administered 2012-08-27 (×3): 0.5 mg via RESPIRATORY_TRACT
  Filled 2012-08-27 (×3): qty 2.5

## 2012-08-27 MED ORDER — DEXTROSE 5 % IV SOLN
10.0000 mg/h | INTRAVENOUS | Status: DC
  Administered 2012-08-27 – 2012-08-28 (×2): 10 mg/h via INTRAVENOUS
  Filled 2012-08-27 (×4): qty 25

## 2012-08-27 MED ORDER — ALPRAZOLAM 0.5 MG PO TABS
0.5000 mg | ORAL_TABLET | Freq: Three times a day (TID) | ORAL | Status: DC | PRN
  Administered 2012-08-27 – 2012-09-15 (×26): 0.5 mg via ORAL
  Filled 2012-08-27 (×2): qty 1
  Filled 2012-08-27: qty 2
  Filled 2012-08-27 (×2): qty 1
  Filled 2012-08-27: qty 2
  Filled 2012-08-27 (×19): qty 1

## 2012-08-27 MED ORDER — PREDNISONE 5 MG PO TABS
5.0000 mg | ORAL_TABLET | Freq: Every day | ORAL | Status: DC
  Administered 2012-08-27 – 2012-08-28 (×2): 5 mg via ORAL
  Filled 2012-08-27 (×4): qty 1

## 2012-08-27 MED ORDER — MOMETASONE FURO-FORMOTEROL FUM 200-5 MCG/ACT IN AERO
2.0000 | INHALATION_SPRAY | Freq: Two times a day (BID) | RESPIRATORY_TRACT | Status: DC
  Administered 2012-08-27 – 2012-08-28 (×3): 2 via RESPIRATORY_TRACT
  Filled 2012-08-27: qty 8.8

## 2012-08-27 MED ORDER — TIOTROPIUM BROMIDE MONOHYDRATE 18 MCG IN CAPS
18.0000 ug | ORAL_CAPSULE | Freq: Every day | RESPIRATORY_TRACT | Status: DC
  Administered 2012-08-27 – 2012-09-15 (×17): 18 ug via RESPIRATORY_TRACT
  Filled 2012-08-27 (×6): qty 5

## 2012-08-27 MED ORDER — DILTIAZEM HCL ER BEADS 240 MG PO CP24
240.0000 mg | ORAL_CAPSULE | Freq: Every day | ORAL | Status: DC
  Administered 2012-08-27 – 2012-08-28 (×2): 240 mg via ORAL
  Filled 2012-08-27 (×2): qty 1

## 2012-08-27 MED ORDER — GUAIFENESIN ER 600 MG PO TB12
1200.0000 mg | ORAL_TABLET | Freq: Two times a day (BID) | ORAL | Status: DC
  Administered 2012-08-27 – 2012-09-16 (×40): 1200 mg via ORAL
  Filled 2012-08-27 (×42): qty 2

## 2012-08-27 MED ORDER — HEPARIN (PORCINE) IN NACL 100-0.45 UNIT/ML-% IJ SOLN
1250.0000 [IU]/h | INTRAMUSCULAR | Status: DC
  Administered 2012-08-27: 1250 [IU]/h via INTRAVENOUS
  Filled 2012-08-27 (×3): qty 250

## 2012-08-27 MED ORDER — EZETIMIBE 10 MG PO TABS
10.0000 mg | ORAL_TABLET | Freq: Every day | ORAL | Status: DC
  Administered 2012-08-27 – 2012-09-16 (×20): 10 mg via ORAL
  Filled 2012-08-27 (×21): qty 1

## 2012-08-27 MED ORDER — HEPARIN BOLUS VIA INFUSION
2500.0000 [IU] | Freq: Once | INTRAVENOUS | Status: AC
  Administered 2012-08-27: 2500 [IU] via INTRAVENOUS
  Filled 2012-08-27: qty 2500

## 2012-08-27 MED ORDER — HYDRALAZINE HCL 50 MG PO TABS
50.0000 mg | ORAL_TABLET | Freq: Two times a day (BID) | ORAL | Status: DC
  Administered 2012-08-27 – 2012-08-28 (×4): 50 mg via ORAL
  Filled 2012-08-27 (×6): qty 1

## 2012-08-27 MED ORDER — MORPHINE SULFATE 2 MG/ML IJ SOLN: 2.0000 mg | INTRAMUSCULAR | Status: DC | PRN

## 2012-08-27 MED ORDER — VANCOMYCIN HCL 10 G IV SOLR
1250.0000 mg | INTRAVENOUS | Status: DC
  Administered 2012-08-27 – 2012-08-28 (×2): 1250 mg via INTRAVENOUS
  Filled 2012-08-27 (×2): qty 1250

## 2012-08-27 MED ORDER — ASPIRIN EC 81 MG PO TBEC
81.0000 mg | DELAYED_RELEASE_TABLET | Freq: Every day | ORAL | Status: DC
  Administered 2012-08-27 – 2012-08-28 (×2): 81 mg via ORAL
  Filled 2012-08-27 (×3): qty 1

## 2012-08-27 MED ORDER — POLYVINYL ALCOHOL 1.4 % OP SOLN
2.0000 [drp] | OPHTHALMIC | Status: DC | PRN
  Filled 2012-08-27: qty 15

## 2012-08-27 NOTE — Progress Notes (Signed)
Pacer interrogated - he did not have any acute events correlating with last night but has been in atrial fibrillation since April 2014. See prior note regarding paced beats. Dayna Dunn PA-C

## 2012-08-27 NOTE — Progress Notes (Signed)
ANTICOAGULATION CONSULT NOTE - Initial Consult  Pharmacy Consult for Heparin Indication: atrial fibrillation  No Known Allergies  Patient Measurements: Height: 5\' 7"  (170.2 cm) Weight: 206 lb 12.7 oz (93.8 kg) IBW/kg (Calculated) : 66.1 Heparin Dosing Weight: 86kg  Vital Signs: Temp: 97.6 F (36.4 C) (06/12 1154) Temp src: Axillary (06/12 1154) BP: 124/89 mmHg (06/12 1415) Pulse Rate: 93 (06/12 1415)  Labs:  Recent Labs  08/25/12 1653 08/27/12 0349 08/27/12 0350 08/27/12 0830  HGB 12.6* 12.0*  --   --   HCT 38.2* 36.1*  --   --   PLT 278.0 284  --   --   CREATININE 2.4* 2.10*  --   --   TROPONINI  --   --  <0.30 0.81*    Estimated Creatinine Clearance: 32.2 ml/min (by C-G formula based on Cr of 2.1).   Medical History: Past Medical History  Diagnosis Date  . Stricture and stenosis of esophagus 12/07/2004    EGD done on 12/07/2004  . GERD (gastroesophageal reflux disease)   . Hypoxemia     With chronic respiratory failure  . Increased prostate specific antigen (PSA) velocity 02/26/2011  . DM (diabetes mellitus) 02/26/2011  . Gout 02/26/2011  . COPD (chronic obstructive pulmonary disease)     oxygen at home, 4L  . OSA (obstructive sleep apnea) 02/26/2011    CPAP  . Paroxysmal atrial fibrillation   . Depression   . Cardiac arrest 2009    while in hospital   . Hypertension   . HTN (hypertension) 02/26/2011  . Cardiac arrest 2009    while in hospital  . Lung cancer     lung s/p lobectomy x2 on RT lung  . Basal cell carcinoma of nose 02/26/2011  . ETOH abuse 01/29/2012    started drinking again - hx cardiac arrest in 2009 due to dt's with organ failure  . CKD (chronic kidney disease) 02/28/2012  . Chronic diastolic CHF (congestive heart failure)     a. EF 55-60% by echo 2013.  . Sick sinus syndrome     a. s/p [employer] pacemaker 2010.  . Bradycardia     a. Admitted 2010 with severe bradycardia, runs of paroxysmal VT.   Marland Kitchen Paroxysmal VT     a. In 2010  in setting of marked bradycardia.  Marland Kitchen CAD (coronary artery disease)     a. Nonobstructive CAD by cath 2006.  Marland Kitchen Esophageal ulcer     a. By EGD 10/2011.  Marland Kitchen Hiatal hernia     a. By EGD 10/2011.  . Valvular heart disease     a. Mild MR/mild AI/mild AS by echo 05/2011.    Medications:  Prescriptions prior to admission  Medication Sig Dispense Refill  . albuterol (PROVENTIL HFA;VENTOLIN HFA) 108 (90 BASE) MCG/ACT inhaler Inhale 2 puffs into the lungs every 6 (six) hours as needed for wheezing.  1 Inhaler  6  . allopurinol (ZYLOPRIM) 100 MG tablet Take 1 tablet (100 mg total) by mouth daily.  90 tablet  3  . ALPRAZolam (XANAX) 0.5 MG tablet Take 1 tablet (0.5 mg total) by mouth 3 (three) times daily as needed. For anxiety/sleeplessness.  90 tablet  3  . aspirin 81 MG tablet Take 81 mg by mouth daily.        Marland Kitchen atorvastatin (LIPITOR) 40 MG tablet Take 40 mg by mouth at bedtime.      . budesonide-formoterol (SYMBICORT) 160-4.5 MCG/ACT inhaler Inhale 2 puffs into the lungs 2 (two) times daily.  3 Inhaler  3  . citalopram (CELEXA) 10 MG tablet Take 1 tablet (10 mg total) by mouth daily.  90 tablet  3  . colchicine 0.6 MG tablet Take 1 tablet (0.6 mg total) by mouth daily as needed. For gout flares  90 tablet  3  . diltiazem (TIAZAC) 240 MG 24 hr capsule Take 1 capsule (240 mg total) by mouth daily.  90 capsule  3  . doxycycline (VIBRA-TABS) 100 MG tablet Take 1 tablet (100 mg total) by mouth 2 (two) times daily.  20 tablet  0  . ezetimibe (ZETIA) 10 MG tablet Take 10 mg by mouth daily.      . fish oil-omega-3 fatty acids 1000 MG capsule Take 2 g by mouth daily.        . Fluticasone-Salmeterol (ADVAIR) 500-50 MCG/DOSE AEPB Inhale 1 puff into the lungs every 12 (twelve) hours.      . furosemide (LASIX) 40 MG tablet 80-120 mg 2 (two) times daily. 120 mg in the morning and 80 mg in the afternoon      . hydrALAZINE (APRESOLINE) 50 MG tablet Take 1 tablet (50 mg total) by mouth 2 (two) times daily.  180 tablet   3  . HYDROcodone-acetaminophen (NORCO) 7.5-325 MG per tablet Take 1 tablet by mouth every 6 (six) hours as needed for pain.  120 tablet  5  . hydroxypropyl methylcellulose (ISOPTO TEARS) 2.5 % ophthalmic solution Place 2 drops into both eyes as needed. For dry/irritated eyes.      . magnesium oxide (DIASENSE MAGNESIUM) 400 (241.3 MG) MG tablet Take 1 tablet (400 mg total) by mouth daily.  90 tablet  3  . metFORMIN (GLUCOPHAGE) 500 MG tablet Take 500 mg by mouth 2 (two) times daily with a meal.      . metoprolol succinate (TOPROL-XL) 50 MG 24 hr tablet Take 50 mg by mouth 2 (two) times daily. Take with or immediately following a meal.      . Multiple Vitamins-Minerals (EYE VITAMINS) TABS Take 1 tablet by mouth 2 (two) times daily.        . multivitamin (THERAGRAN) per tablet Take 1 tablet by mouth daily.        Marland Kitchen omeprazole (PRILOSEC) 20 MG capsule Take 1 tablet by mouth once daily. PHARMACY-PLEASE D/C NEXIUM RX...TOO EXPENSIVE FOR PATIENT.  90 capsule  1  . potassium chloride SA (K-DUR,KLOR-CON) 20 MEQ tablet Take 1 tablet (20 mEq total) by mouth daily.  90 tablet  3  . predniSONE (DELTASONE) 5 MG tablet Take 5 mg by mouth daily.      . temazepam (RESTORIL) 30 MG capsule Take 1 capsule (30 mg total) by mouth at bedtime.  90 capsule  1  . tiotropium (SPIRIVA) 18 MCG inhalation capsule Place 1 capsule (18 mcg total) into inhaler and inhale daily.  90 capsule  3  . vitamin B-12 (CYANOCOBALAMIN) 100 MCG tablet Take 50 mcg by mouth daily.        . vitamin E 600 UNIT capsule Take 600 Units by mouth 2 (two) times daily.        . nitroGLYCERIN (NITROSTAT) 0.4 MG SL tablet Place 1 tablet (0.4 mg total) under the tongue every 5 (five) minutes as needed for chest pain.  25 tablet  prn    Assessment: 79yom to start heparin for Afib. No bleeding is reported per RN and patient has not been on anticoagulants PTA. Patient received SQ heparin 5000 units ~ 0900 this AM - will adjust  bolus.   - H/H and Plts wnl -  Heparin weight: 86kg  Goal of Therapy:  Heparin level 0.3-0.7 units/ml Monitor platelets by anticoagulation protocol: Yes   Plan:  1. Heparin IV bolus 2500 units x 1 2. Heparin drip 1250 units/hr (12.5 ml/hr) 3. Check heparin level 8 hours after heparin initiated 4. Daily heparin level and CBC 5. INR and PTT now  Cleon Dew [phone] 08/27/2012,2:38 PM

## 2012-08-27 NOTE — Progress Notes (Signed)
Rec'd pt on Bipap approx 0800 this morning on 12/6 50%.  Origional orders were for Bipap continuous.  RN spoke w/ MD, bipap changed to prn.  RN placed pt on 5 lpm [place], pt tol well, no distress noted.  RR 20-24, Sat 98% on 5 lpm [place]. [facility] clear diminished

## 2012-08-27 NOTE — Consult Note (Signed)
ANTIBIOTIC CONSULT NOTE - INITIAL  Pharmacy Consult for Vancomycin and Zosyn Indication: rule out pneumonia  No Known Allergies  Patient Measurements: Height: 5\' 7"  (170.2 cm) Weight: 206 lb 12.7 oz (93.8 kg) IBW/kg (Calculated) : 66.1  Vital Signs: Temp: 98.1 F (36.7 C) (06/12 0810) Temp src: Oral (06/12 0810) BP: 134/82 mmHg (06/12 0810) Pulse Rate: 100 (06/12 0810) Intake/Output from previous day:   Intake/Output from this shift:    Labs:  Recent Labs  08/25/12 1653 08/27/12 0349  WBC 14.6* 20.9*  HGB 12.6* 12.0*  PLT 278.0 284  CREATININE 2.4* 2.10*   Estimated Creatinine Clearance: 32.2 ml/min (by C-G formula based on Cr of 2.1). Microbiology: No results found for this or any previous visit (from the past 720 hour(s)).  Medical History: Past Medical History  Diagnosis Date  . Stricture and stenosis of esophagus 12/07/2004    EGD done on 12/07/2004  . GERD (gastroesophageal reflux disease)   . Hypoxemia   . Increased prostate specific antigen (PSA) velocity 02/26/2011  . DM (diabetes mellitus) 02/26/2011  . Gout 02/26/2011  . COPD (chronic obstructive pulmonary disease)     oxygen at home, 3L 24/7  . OSA (obstructive sleep apnea) 02/26/2011    CPAP  . Atrial fibrillation   . Depression   . Cardiac arrest 2009    while in hospital   . Coronary artery disease     non obstructive cath 2006  . Hypertension   . HTN (hypertension) 02/26/2011  . Pacemaker   . Cardiac arrest 2009    while in hospital  . Cancer     lung s/p lobectomy x2 on RT lung  . Basal cell carcinoma of nose 02/26/2011  . Oxygen dependent   . ETOH abuse 01/29/2012    started drinking again - hx cardiac arrest in 2009 due to dt's with organ failure  . CKD (chronic kidney disease) 02/28/2012   Assessment: 79yom with chronic respiratory failure due to COPD/CHF presents to the ED with worsening SOB. He is without fevers, but WBC is elevated at 20.9. He will begin empiric vancomycin  and zosyn for possible pneumonia. He has a CKD and sCr is elevated on admission at 2.1 with CrCl 82ml/min. He received one dose of IV levaquin in the ED.  Of note, patient was on doxycycline pta for left heel cellulitis.  Goal of Therapy:  Vancomycin trough level 15-20 mcg/ml Appropriate zosyn dosing  Plan:  1) Vancomycin 1250mg  IV q24 2) Zosyn 3.375g IV q8 (4 hour infusion) 3) Watch renal function closely and adjust doses as needed, follow cultures, trough as indicated  Fredrik Rigger 08/27/2012,8:18 AM

## 2012-08-27 NOTE — Progress Notes (Signed)
Per monitor it appeared that pt experienced V tach during 1700 hour, however; Marsena with the pacemaker team was actually monitoring pt during this time and informed nurse that these were actually "pacer beats".  Nurse asked for clarification regarding detecting actual [facility], nurse was informed that the pacemaker can create pacer beats up to 120, therefore if pt is experiencing this rhythm with a heart rate >120, this could signal actual VTACH.  Nurse will continue to monitor.

## 2012-08-27 NOTE — Progress Notes (Signed)
  Echocardiogram 2D Echocardiogram has been performed.  Cathie Beams 08/27/2012, 11:36 AM

## 2012-08-27 NOTE — ED Notes (Signed)
Patient awoke from sleep with shortness of breath.  Patient states he was not able to get relief from albuterol HFA.  EMS gave a total of 10mg  of albuterol and 0.5mg  of atrovent, plus 125mg  of solumedrol.  Patient is CAOx3.  Patient states he has been running temps, he is warm to the touch.

## 2012-08-27 NOTE — ED Provider Notes (Signed)
History     CSN: [id number]  Arrival date & time 08/27/12  5284   First MD Initiated Contact with Patient 08/27/12 [phone]      Chief Complaint  Patient presents with  . Shortness of Breath   05 caveat: Severity of illness  HPI Patient reports worsening shortness of breath over the past 4-5 days that became acutely worse this evening.  His symptoms were unrelieved by albuterol at home.  EMS was called.  The patient was found to be in severe respiratory distress.  He was initiated on 10 mg of albuterol and 0.5 mg of Atrovent.  He was given 125 mg of IV Solu-Medrol.  He reports his had subjective fevers at home and cough.  He has a history of coronary artery disease as well as COPD.  He follows with pulmonary.  His symptoms are moderate to severe in severity.  He can only speak in short bursts of the words at this point.  Limited history given severity of breathing.   Past Medical History  Diagnosis Date  . Stricture and stenosis of esophagus 12/07/2004    EGD done on 12/07/2004  . GERD (gastroesophageal reflux disease)   . Hypoxemia   . Increased prostate specific antigen (PSA) velocity 02/26/2011  . DM (diabetes mellitus) 02/26/2011  . Gout 02/26/2011  . COPD (chronic obstructive pulmonary disease)     oxygen at home, 3L 24/7  . OSA (obstructive sleep apnea) 02/26/2011    CPAP  . Atrial fibrillation   . Depression   . Cardiac arrest 2009    while in hospital   . Coronary artery disease     non obstructive cath 2006  . Hypertension   . HTN (hypertension) 02/26/2011  . Pacemaker   . Cardiac arrest 2009    while in hospital  . Cancer     lung s/p lobectomy x2 on RT lung  . Basal cell carcinoma of nose 02/26/2011  . Oxygen dependent   . ETOH abuse 01/29/2012    started drinking again - hx cardiac arrest in 2009 due to dt's with organ failure  . CKD (chronic kidney disease) 02/28/2012    Past Surgical History  Procedure Laterality Date  . Lung lobectomy      x2 lobes on  RT lung  . Knee arthroscopy      bilateral  . Pace maker    . Tonsillectomy    . Hernia repair      x2, esophageal  . Cardiac catheterization    . Esophagogastroduodenoscopy  10/25/2011    Procedure: ESOPHAGOGASTRODUODENOSCOPY (EGD);  Surgeon: Hart Carwin, MD;  Location: [facility];  Service: Endoscopy;  Laterality: N/A;    Family History  Problem Relation Age of Onset  . Heart disease Father   . Lung cancer Mother   . Cancer Other     lung cancer  . Heart disease Other   . Hypertension Other     History  Substance Use Topics  . Smoking status: Former Smoker -- 1.50 packs/day for 50 years    Types: Cigarettes    Quit date: 06/28/1996  . Smokeless tobacco: Current User    Types: Chew  . Alcohol Use: Yes     Comment: 2 beers 2-3 times /week      Review of Systems  Unable to perform ROS: Acuity of condition    Allergies  Review of patient's allergies indicates no known allergies.  Home Medications   Current Outpatient Rx  Name  Route  Sig  Dispense  Refill  . albuterol (PROVENTIL HFA;VENTOLIN HFA) 108 (90 BASE) MCG/ACT inhaler   Inhalation   Inhale 2 puffs into the lungs every 6 (six) hours as needed for wheezing.   1 Inhaler   6   . allopurinol (ZYLOPRIM) 100 MG tablet   Oral   Take 1 tablet (100 mg total) by mouth daily.   90 tablet   3   . ALPRAZolam (XANAX) 0.5 MG tablet   Oral   Take 1 tablet (0.5 mg total) by mouth 3 (three) times daily as needed. For anxiety/sleeplessness.   90 tablet   3   . aspirin 81 MG tablet   Oral   Take 81 mg by mouth daily.           Marland Kitchen atorvastatin (LIPITOR) 40 MG tablet   Oral   Take 40 mg by mouth at bedtime.         . budesonide-formoterol (SYMBICORT) 160-4.5 MCG/ACT inhaler   Inhalation   Inhale 2 puffs into the lungs 2 (two) times daily.   3 Inhaler   3   . citalopram (CELEXA) 10 MG tablet   Oral   Take 1 tablet (10 mg total) by mouth daily.   90 tablet   3   . colchicine 0.6 MG tablet    Oral   Take 1 tablet (0.6 mg total) by mouth daily as needed. For gout flares   90 tablet   3   . diltiazem (TIAZAC) 240 MG 24 hr capsule   Oral   Take 1 capsule (240 mg total) by mouth daily.   90 capsule   3   . doxycycline (VIBRA-TABS) 100 MG tablet   Oral   Take 1 tablet (100 mg total) by mouth 2 (two) times daily.   20 tablet   0   . fish oil-omega-3 fatty acids 1000 MG capsule   Oral   Take 2 g by mouth daily.           . furosemide (LASIX) 40 MG tablet      To take 3 tabs by mouth in the AM, and 2-3 in the PM   120 tablet   1   . hydrALAZINE (APRESOLINE) 50 MG tablet   Oral   Take 1 tablet (50 mg total) by mouth 2 (two) times daily.   180 tablet   3   . HYDROcodone-acetaminophen (NORCO) 7.5-325 MG per tablet   Oral   Take 1 tablet by mouth every 6 (six) hours as needed for pain.   120 tablet   5   . hydroxypropyl methylcellulose (ISOPTO TEARS) 2.5 % ophthalmic solution   Both Eyes   Place 2 drops into both eyes as needed. For dry/irritated eyes.         . magnesium oxide (DIASENSE MAGNESIUM) 400 (241.3 MG) MG tablet   Oral   Take 1 tablet (400 mg total) by mouth daily.   90 tablet   3   . metoprolol (LOPRESSOR) 50 MG tablet   Oral   Take 1 tablet (50 mg total) by mouth 2 (two) times daily.   180 tablet   3   . Multiple Vitamins-Minerals (EYE VITAMINS) TABS   Oral   Take 1 tablet by mouth 2 (two) times daily.           . multivitamin (THERAGRAN) per tablet   Oral   Take 1 tablet by mouth daily.           Marland Kitchen  nitroGLYCERIN (NITROSTAT) 0.4 MG SL tablet   Sublingual   Place 1 tablet (0.4 mg total) under the tongue every 5 (five) minutes as needed for chest pain.   25 tablet   prn   . omeprazole (PRILOSEC) 20 MG capsule      Take 1 tablet by mouth once daily. PHARMACY-PLEASE D/C NEXIUM RX...TOO EXPENSIVE FOR PATIENT.   90 capsule   1   . potassium chloride SA (K-DUR,KLOR-CON) 20 MEQ tablet   Oral   Take 1 tablet (20 mEq total) by  mouth daily.   90 tablet   3   . predniSONE (DELTASONE) 5 MG tablet   Oral   Take 5 mg by mouth daily.         . temazepam (RESTORIL) 30 MG capsule   Oral   Take 1 capsule (30 mg total) by mouth at bedtime.   90 capsule   1   . tiotropium (SPIRIVA) 18 MCG inhalation capsule   Inhalation   Place 1 capsule (18 mcg total) into inhaler and inhale daily.   90 capsule   3   . vitamin B-12 (CYANOCOBALAMIN) 100 MCG tablet   Oral   Take 50 mcg by mouth daily.           . vitamin E 600 UNIT capsule   Oral   Take 600 Units by mouth 2 (two) times daily.             BP 156/88  Pulse 113  Temp(Src) 99 F (37.2 C) (Oral)  Resp 30  SpO2 100%  Physical Exam  Nursing note and vitals reviewed. Constitutional: He is oriented to person, place, and time. He appears well-developed and well-nourished.  HENT:  Head: Normocephalic and atraumatic.  Eyes: EOM are normal.  Neck: Normal range of motion.  Cardiovascular: Normal rate, regular rhythm, normal heart sounds and intact distal pulses.   Pulmonary/Chest: Accessory muscle usage present. Tachypnea noted. He is in respiratory distress. He has wheezes. He has rales.  Abdominal: Soft. He exhibits no distension. There is no tenderness.  Genitourinary: Rectum normal.  Musculoskeletal: Normal range of motion.  Bilateral 1+ pitting edema.  Neurological: He is alert and oriented to person, place, and time.  Skin: Skin is warm and dry.  Psychiatric: He has a normal mood and affect. Judgment normal.    ED Course  Procedures (including critical care time)   Date: 08/27/2012  Rate: 115  Rhythm: Atrial fibrillation with rapid ventricular response  QRS Axis: normal  Intervals: normal  ST/T Wave abnormalities: Nonspecific ST and T wave changes  Conduction Disutrbances: none  Narrative Interpretation:   Old EKG Reviewed: No significant changes noted  CRITICAL CARE Performed by: Lyanne Co Total critical care time: 32 Critical  care time was exclusive of separately billable procedures and treating other patients. Critical care was necessary to treat or prevent imminent or life-threatening deterioration. Critical care was time spent personally by me on the following activities: development of treatment plan with patient and/or surrogate as well as nursing, discussions with consultants, evaluation of patient's response to treatment, examination of patient, obtaining history from patient or surrogate, ordering and performing treatments and interventions, ordering and review of laboratory studies, ordering and review of radiographic studies, pulse oximetry and re-evaluation of patient's condition.    Labs Reviewed  CBC WITH DIFFERENTIAL - Abnormal; Notable for the following:    WBC 20.9 (*)    RBC 3.86 (*)    Hemoglobin 12.0 (*)    HCT  36.1 (*)    RDW 16.1 (*)    Neutrophils Relative % 81 (*)    Neutro Abs 17.0 (*)    Lymphocytes Relative 8 (*)    Monocytes Absolute 2.2 (*)    All other components within normal limits  BASIC METABOLIC PANEL - Abnormal; Notable for the following:    Glucose, Bld 196 (*)    BUN 33 (*)    Creatinine, Ser 2.10 (*)    GFR calc non Af Amer 29 (*)    GFR calc Af Amer 33 (*)    All other components within normal limits  PRO B NATRIURETIC PEPTIDE - Abnormal; Notable for the following:    Pro B Natriuretic peptide (BNP) 3385.0 (*)    All other components within normal limits  TROPONIN I   Dg Chest Portable 1 View  08/27/2012   *RADIOLOGY REPORT*  Clinical Data: Shortness of breath.  History of lung cancer post lobectomy.  PORTABLE CHEST - 1 VIEW  Comparison: 02/02/2012  Findings: Shallow inspiration.  Cardiac enlargement with pulmonary vascular congestion and perihilar edema.  Bilateral pleural effusions.  Atelectasis in the lung bases.  No pneumothorax. Changes have developed since the previous study.  Postoperative changes in the right chest.  Stable appearance of cardiac pacemaker and  left central venous catheter.  Calcified and tortuous aorta.  IMPRESSION: Interval development of cardiac enlargement, pulmonary vascular congestion, perihilar edema, and bilateral pleural effusions.   Original Report Authenticated By: Burman Nieves, [name]   I personally reviewed the imaging tests through PACS system I reviewed available ER/hospitalization records through the EMR   1. CHF (congestive heart failure), acute, systolic   2. COPD (chronic obstructive pulmonary disease)       MDM  Patient presents in severe respiratory distress.  Initiated on BiPAP on arrival.  Albuterol being given by respiratory therapy on my arrival.  Patient has significant accessory muscle use.  He can talk to me and seems to be alert and oriented.  There is no indication this time for intubation.  We'll continue to follow this patient closely as he is severely ill   6:01 AM Patient's breathing is significantly improved at this time.  Likely combination CHF/COPD.  Facial be metastatic then unit.  He continues on BiPAP at this time.  For now I will leave this time.  IV Lasix given.  Given elevated white blood cell count the patient be started on IV Levaquin despite absence of infiltrate on his chest x-ray.  This was requested by the admitting team.       Lyanne Co, MD 08/27/12 [phone]

## 2012-08-27 NOTE — Clinical Documentation Improvement (Signed)
CKD DOCUMENTATION CLARIFICATION QUERY   THIS DOCUMENT IS NOT A PERMANENT PART OF THE MEDICAL RECORD  TO RESPOND TO THE THIS QUERY, FOLLOW THE INSTRUCTIONS BELOW:  1. If needed, update documentation for the patient's encounter via the notes activity.  2. Access this query again and click edit on the In [employer].  3. After updating, or not, click F2 to complete all highlighted (required) fields concerning your review. Select "additional documentation in the medical record" OR "no additional documentation provided".  4. Click Sign note button.  5. The deficiency will fall out of your In Basket *Please let us know if you are not able to complete this workflow by phone or e-mail (listed below).  Please update your documentation within the medical record to reflect your response to this query.                                                                                        08/27/12   Dear Dr. Clydia Llano /  Associates,  In a better effort to capture your patient's severity of illness, reflect appropriate length of stay and utilization of resources, a review of the patient medical record has revealed the following indicators.    Based on your clinical judgment, please clarify and document in a progress note and/or discharge summary the clinical condition associated with the following supporting information:  You may use possible, probable, or suspect with inpatient documentation. possible, probable, suspected diagnoses MUST be documented at the time of discharge.  In responding to this query please exercise your independent judgment.  The fact that a query is asked, does not imply that any particular answer is desired or expected.  Possible Clinical Conditions?   _______CKD Stage I -  GFR > OR = 90 _______CKD Stage II - GFR 60-80 _______CKD Stage III - GFR 30-59 _______CKD Stage IV - GFR 15-29 _______CKD Stage V - GFR < 15 _______ESRD (End Stage Renal Disease) _______Other  condition_____________ _______Cannot Clinically determine   Supporting Information:  Risk Factors:  Per 6/12 H&P Patient was reported he needed dialysis in the past while he was in the hospital.  Signs & Symptoms:  Per  6/12/ progress note: Estimated Creatinine Clearance: 32.2 ml/min (by C-G formula based on Cr of 2.1). CREATININE   Diagnostics:  Lab:  08/27/12 = BUN 33, Creatine 2.10, GFR 29      Reviewed: additional documentation in the medical record   Thank You,  Shelda Pal RN, BSN, CCM    Clinical Documentation Specialist Phone [phone] Health Information Management [place]

## 2012-08-27 NOTE — Progress Notes (Signed)
INITIAL NUTRITION ASSESSMENT  DOCUMENTATION CODES Per approved criteria  -Obesity Unspecified   INTERVENTION: 1.  Modify diet; per MD discretion once respiratory status allows to CHO Mod High.   NUTRITION DIAGNOSIS: Inadequate oral intake related to omission of energy dense foods as evidenced by NPO status.    Monitor:  1.  Food/Beverage; resume of PO diet with pt meeting >/=90% estimated needs 2.  Wt/wt change; monitor trends  Reason for Assessment: MST  79 y.o. male  Admitting Dx: Acute-on-chronic respiratory failure  ASSESSMENT: Pt admitted with shortness of breath, cough, and fevers. Pt is on bi-pap at time of visit.  Discussed nutrition status with wife who reports Heart Healthy eating at home.  She reports some wt loss which she believes is related to fluid.  She reports pt has also stopped his light hs snacking.  She believes pt eats well and was eating per his usual PTA.    Height: Ht Readings from Last 1 Encounters:  08/27/12 5\' 7"  (1.702 m)    Weight: Wt Readings from Last 1 Encounters:  08/27/12 206 lb 12.7 oz (93.8 kg)    Ideal Body Weight: 64.5 kg  % Ideal Body Weight: 145%  Wt Readings from Last 10 Encounters:  08/27/12 206 lb 12.7 oz (93.8 kg)  08/25/12 205 lb (92.987 kg)  08/18/12 204 lb 12.8 oz (92.897 kg)  08/04/12 201 lb 12.8 oz (91.536 kg)  06/10/12 215 lb (97.523 kg)  06/01/12 217 lb 12.8 oz (98.793 kg)  05/14/12 219 lb (99.338 kg)  04/06/12 216 lb (97.977 kg)  02/27/12 217 lb (98.431 kg)  02/18/12 215 lb 14.4 oz (97.932 kg)    Usual Body Weight: 220 lbs  % Usual Body Weight: 93%  BMI:  Body mass index is 32.38 kg/(m^2).  Estimated Nutritional Needs: Kcal: 2050-2350 Protein: 93-112g Fluid: ~2.0 L/day  Skin: intact, dry  Diet Order:  NPO  EDUCATION NEEDS: -Education needs addressed   Intake/Output Summary (Last 24 hours) at 08/27/12 1038 Last data filed at 08/27/12 1000  Gross per 24 hour  Intake    150 ml  Output       0 ml  Net    150 ml    Last BM: PTA  Labs:   Recent Labs Lab 08/25/12 1653 08/27/12 0349  NA 139 138  K 4.1 3.7  CL 102 98  CO2 29 28  BUN 33* 33*  CREATININE 2.4* 2.10*  CALCIUM 8.9 8.8  GLUCOSE 172* 196*    CBG (last 3)  No results found for this basename: GLUCAP,  in the last 72 hours  Scheduled Meds: . albuterol  2.5 mg Nebulization QID  . aspirin      . aspirin EC  81 mg Oral Daily  . atorvastatin  40 mg Oral QHS  . citalopram  10 mg Oral Daily  . diltiazem  240 mg Oral Daily  . ezetimibe  10 mg Oral Daily  . furosemide  80 mg Intravenous Q8H  . guaiFENesin  1,200 mg Oral BID  . heparin  5,000 Units Subcutaneous Q8H  . hydrALAZINE  50 mg Oral BID  . insulin aspart  0-9 Units Subcutaneous TID WC  . ipratropium  0.5 mg Nebulization QID  . magnesium oxide  400 mg Oral Daily  . metoprolol succinate  50 mg Oral BID WC  . mometasone-formoterol  2 puff Inhalation BID  . pantoprazole  40 mg Oral Daily  . piperacillin-tazobactam (ZOSYN)  IV  3.375 g Intravenous Q8H  .  predniSONE  5 mg Oral Daily  . sodium chloride  3 mL Intravenous Q12H  . tiotropium  18 mcg Inhalation Daily  . vancomycin  1,250 mg Intravenous Q24H    Continuous Infusions:   Past Medical History  Diagnosis Date  . Stricture and stenosis of esophagus 12/07/2004    EGD done on 12/07/2004  . GERD (gastroesophageal reflux disease)   . Hypoxemia   . Increased prostate specific antigen (PSA) velocity 02/26/2011  . DM (diabetes mellitus) 02/26/2011  . Gout 02/26/2011  . COPD (chronic obstructive pulmonary disease)     oxygen at home, 3L 24/7  . OSA (obstructive sleep apnea) 02/26/2011    CPAP  . Atrial fibrillation   . Depression   . Cardiac arrest 2009    while in hospital   . Coronary artery disease     non obstructive cath 2006  . Hypertension   . HTN (hypertension) 02/26/2011  . Pacemaker   . Cardiac arrest 2009    while in hospital  . Cancer     lung s/p lobectomy x2 on RT lung   . Basal cell carcinoma of nose 02/26/2011  . Oxygen dependent   . ETOH abuse 01/29/2012    started drinking again - hx cardiac arrest in 2009 due to dt's with organ failure  . CKD (chronic kidney disease) 02/28/2012    Past Surgical History  Procedure Laterality Date  . Lung lobectomy      x2 lobes on RT lung  . Knee arthroscopy      bilateral  . Pace maker    . Tonsillectomy    . Hernia repair      x2, esophageal  . Cardiac catheterization    . Esophagogastroduodenoscopy  10/25/2011    Procedure: ESOPHAGOGASTRODUODENOSCOPY (EGD);  Surgeon: Hart Carwin, MD;  Location: [facility];  Service: Endoscopy;  Laterality: N/A;    Loyce Dys, MS RD LDN Clinical Inpatient Dietitian Pager: [phone] Weekend/After hours pager: [phone]

## 2012-08-27 NOTE — Consult Note (Signed)
CARDIOLOGY CONSULT NOTE  Patient ID: Ranell Skibinski., MRN: [id number], DOB/AGE: Aug 13, 1932 79 y.o. Admit date: 08/27/2012   Date of Consult: 08/27/2012 Primary Physician: [name], MD Primary Cardiologist: Patty Sermons (EP - Graciela Husbands)  Chief Complaint: Shortness of breath Reason for Consult: CHF  HPI: Patient is a 79 yo male with a past medical history of chronic respiratory failure on 4L of oxygen at home secondary to COPD on chronic prednisone, PAF, HTN, Chronic Diastolic CHF (EF 78-29%),  SSS s/p pacemaker, nonobstructive CAD 2006, mild valvular heart disease, DM, CKD, EtOH use (prior h/o DTs), lung cancer, OSA who came to the [facility] due to increased difficulty breathing.  For about 2 months, Mr. Dansby has had increased swelling in LE R>L. They do not have a working scale at home and are waiting to obtain a new one through their insurance. His weight is actually down from earlier this year but up from last month (219 on February, down to 201 in May, up to 206 on admit). Has also noticed early satiety and difficulty breathing if continues to eat. He began having increased dyspnea the past month. He was treated with a prednisone taper just a few weeks ago with some improvement. His home O2 was also increased from 3L to 4L two weeks ago. He was also put on doxycycline just a few days ago by PCP for L ankle cellulitis.  Last night he noted some nausea. In the middle of the night, he woke up to go to the bathroom then had difficulty getting his Cpap back on. He developed severe orthopnea and acute increase in dyspnea.  His wife woke up and was alarmed at how much he was struggling, so called EMS. He received albuterol neb and IV solumedrol by EMS with limited improvement in symptoms thus was placed on BIPAP. He did report subjective fevers at home (no recorded temp) and an intermittently productive cough for the last week. CXR was negative for pneumonia, but showed small bilateral effusions and vascular  congestion consistent with CHF and cardiac enlargement.  WBC count was 21K (14k two days ago). Procalcitonin 1.49. He was started on antibiotics, pBNP 3385, troponins <0.30 then 0.81. He denies any chest pain whatsoever. No palpitations or syncope. EKG showed afib without ST changes. Rates remain upper 90s-120s. We asked to consult regarding management of his CHF. Cr was 2.4 several days ago, 2.10 on admission (baseline appears 1.5-2). He takes Lasix 120mg  qam/80mg  pm - she states if he does not take lasix, he simply does not urinate. He has been urinating his usual amount, but on some days the Lasix doesn't seem to work as well as other times. He received 60mg  IV Lasix at 5am and 80mg  IV Lasix at 8 am, and has been written to continue 80mg  IV TID. He currently feels much better but UOP is sluggish.  With regard to h/o afib, this was previously seen on pacemaker with episodes up to 3 hours - Dr. Odessa Fleming notes from 2013 indicate that his thromboembolic risk profile would justify long-term anticoagulation but the duration to justify this was not clear - he wanted to watch for more atrial fibrillation.   Past Medical History  Diagnosis Date  . Stricture and stenosis of esophagus 12/07/2004    EGD done on 12/07/2004  . GERD (gastroesophageal reflux disease)   . Hypoxemia     With chronic respiratory failure  . Increased prostate specific antigen (PSA) velocity 02/26/2011  . DM (diabetes mellitus) 02/26/2011  .  Gout 02/26/2011  . COPD (chronic obstructive pulmonary disease)     oxygen at home, 4L  . OSA (obstructive sleep apnea) 02/26/2011    CPAP  . Paroxysmal atrial fibrillation   . Depression   . Cardiac arrest 2009    while in hospital   . Hypertension   . HTN (hypertension) 02/26/2011  . Cardiac arrest 2009    while in hospital  . Lung cancer     lung s/p lobectomy x2 on RT lung  . Basal cell carcinoma of nose 02/26/2011  . ETOH abuse 01/29/2012    started drinking again - hx cardiac  arrest in 2009 due to dt's with organ failure  . CKD (chronic kidney disease) 02/28/2012  . Chronic diastolic CHF (congestive heart failure)     a. EF 55-60% by echo 2013.  . Sick sinus syndrome     a. s/p Medtronic pacemaker 2010.  . Bradycardia     a. Admitted 2010 with severe bradycardia, runs of paroxysmal VT.   Marland Kitchen Paroxysmal VT     a. In 2010 in setting of marked bradycardia.  Marland Kitchen CAD (coronary artery disease)     a. Nonobstructive CAD by cath 2006.  Marland Kitchen Esophageal ulcer     a. By EGD 10/2011.  Marland Kitchen Hiatal hernia     a. By EGD 10/2011.  . Valvular heart disease     a. Mild MR/mild AI/mild AS by echo 05/2011.      Most Recent Cardiac Studies: 2D Echo 05/2011 - Left ventricle: The cavity size was normal. Systolic function was normal. The estimated ejection fraction was in the range of 55% to 60%. Wall motion was normal; there were no regional wall motion abnormalities. Doppler parameters are consistent with abnormal left ventricular relaxation (grade 1 diastolic dysfunction). - Aortic valve: There was very mild stenosis. Mild regurgitation. - Mitral valve: Mild regurgitation. - Left atrium: The atrium was moderately dilated. - Atrial septum: No defect or patent foramen ovale was identified. - Pulmonary arteries: PA peak pressure: 35mm Hg (S).   Cardiac Cath 10/2004 DESCRIPTION OF PROCEDURE: The patient was brought to cardiac cath lab after  appropriate informed consent. He was prepped and draped in a sterile  fashion. Approximately 20 cc of 1% lidocaine was used for local anesthesia.  A 4-French sheath was placed in right femoral artery without difficulty.  Coronary angiography was then performed. Multiple attempts were made in  placing the pigtail catheter across the aortic valve and were successful;  however, the patient had a significant ventricular ectopy on each passing of  the catheter across the aortic valve. Therefore no ventriculogram was  performed.  RESULTS:  1. Left  main: No significant disease.  2. LAD: Diffuse mild to moderate luminal irregularities with mid vessel  showing 30-40% stenosis. It is a moderate-size vessel throughout its  course with a wrap around of the apex.  3. D1/D2: Small vessels with mild luminal irregularity.  4. LCX: Nondominant with mild luminal irregularities.  5. OM1/OM2/OM3: Moderate-sized with mild luminal irregularities.  6. RCA: Dominant with mild to moderate luminal irregularities. No  significant obstructive disease noted in the PDA or PLV.  7. No left ventriculogram was done secondary to ventricular ectopy.  8. Limited right femoral angiogram was performed showing significant right  femoral calcification but no significant obstructive peripheral vascular  disease was noted.  IMPRESSION:  1. Nonobstructive coronary arteries.  2. History of mild left ventricular dysfunction with an ejection fraction  of 45% by recent  stress test.  PLAN:  1. From a cardiovascular standpoint, the patient has no significant  obstructive coronary disease. He will be scheduled for an outpatient  echocardiogram to evaluate for any significant valvular heart disease as  well as to  evaluate RV and LV function. He is moderate risk from his comorbidities  for his surgery; however, no further cardiac workup is needed. He may  proceed with surgery as scheduled. Recommend continued perioperative B-  Blockers, Stains, transfusion as needed should Hb fall < 10 to minimize  cardiac demand.    Surgical History:  Past Surgical History  Procedure Laterality Date  . Lung lobectomy      x2 lobes on RT lung  . Knee arthroscopy      bilateral  . Pace maker    . Tonsillectomy    . Hernia repair      x2, esophageal  . Cardiac catheterization    . Esophagogastroduodenoscopy  10/25/2011    Procedure: ESOPHAGOGASTRODUODENOSCOPY (EGD);  Surgeon: Hart Carwin, MD;  Location: [facility];  Service: Endoscopy;  Laterality: N/A;     Home Meds: Prior  to Admission medications   Medication Sig Start Date End Date Taking? Authorizing Provider  albuterol (PROVENTIL HFA;VENTOLIN HFA) 108 (90 BASE) MCG/ACT inhaler Inhale 2 puffs into the lungs every 6 (six) hours as needed for wheezing. 02/27/12 02/26/13 Yes Corwin Levins, MD  allopurinol (ZYLOPRIM) 100 MG tablet Take 1 tablet (100 mg total) by mouth daily. 05/14/12  Yes Corwin Levins, MD  ALPRAZolam Prudy Feeler) 0.5 MG tablet Take 1 tablet (0.5 mg total) by mouth 3 (three) times daily as needed. For anxiety/sleeplessness. 04/28/12  Yes Corwin Levins, MD  aspirin 81 MG tablet Take 81 mg by mouth daily.     Yes Historical Provider, MD  atorvastatin (LIPITOR) 40 MG tablet Take 40 mg by mouth at bedtime. 02/27/12  Yes Corwin Levins, MD  budesonide-formoterol [facility]) 160-4.5 MCG/ACT inhaler Inhale 2 puffs into the lungs 2 (two) times daily. 02/27/12 02/26/13 Yes Corwin Levins, MD  citalopram (CELEXA) 10 MG tablet Take 1 tablet (10 mg total) by mouth daily. 06/17/12 06/17/13 Yes Corwin Levins, MD  colchicine 0.6 MG tablet Take 1 tablet (0.6 mg total) by mouth daily as needed. For gout flares 02/27/12  Yes Corwin Levins, MD  diltiazem [facility]) 240 MG 24 hr capsule Take 1 capsule (240 mg total) by mouth daily. 02/27/12  Yes Corwin Levins, MD  doxycycline (VIBRA-TABS) 100 MG tablet Take 1 tablet (100 mg total) by mouth 2 (two) times daily. 08/25/12  Yes Corwin Levins, MD  ezetimibe (ZETIA) 10 MG tablet Take 10 mg by mouth daily.   Yes Historical Provider, MD  fish oil-omega-3 fatty acids 1000 MG capsule Take 2 g by mouth daily.     Yes Historical Provider, MD  Fluticasone-Salmeterol (ADVAIR) 500-50 MCG/DOSE AEPB Inhale 1 puff into the lungs every 12 (twelve) hours.   Yes Historical Provider, MD  furosemide (LASIX) 40 MG tablet 80-120 mg 2 (two) times daily. 120 mg in the morning and 80 mg in the afternoon 08/04/12  Yes Coralyn Helling, MD  hydrALAZINE (APRESOLINE) 50 MG tablet Take 1 tablet (50 mg total) by mouth 2 (two) times  daily. 07/13/12  Yes Corwin Levins, MD  HYDROcodone-acetaminophen (NORCO) 7.5-325 MG per tablet Take 1 tablet by mouth every 6 (six) hours as needed for pain. 08/25/12  Yes Corwin Levins, MD  hydroxypropyl methylcellulose (ISOPTO TEARS) 2.5 % ophthalmic  solution Place 2 drops into both eyes as needed. For dry/irritated eyes.   Yes Historical Provider, MD  magnesium oxide (DIASENSE MAGNESIUM) 400 (241.3 MG) MG tablet Take 1 tablet (400 mg total) by mouth daily. 02/27/12  Yes Corwin Levins, MD  metFORMIN (GLUCOPHAGE) 500 MG tablet Take 500 mg by mouth 2 (two) times daily with a meal.   Yes Historical Provider, MD  metoprolol succinate (TOPROL-XL) 50 MG 24 hr tablet Take 50 mg by mouth 2 (two) times daily. Take with or immediately following a meal.   Yes Historical Provider, MD  Multiple Vitamins-Minerals (EYE VITAMINS) TABS Take 1 tablet by mouth 2 (two) times daily.     Yes Historical Provider, MD  multivitamin [facility]) per tablet Take 1 tablet by mouth daily.     Yes Historical Provider, MD  omeprazole (PRILOSEC) 20 MG capsule Take 1 tablet by mouth once daily. PHARMACY-PLEASE D/C NEXIUM RX...TOO EXPENSIVE FOR PATIENT. 04/23/12  Yes Hart Carwin, MD  potassium chloride SA (K-DUR,KLOR-CON) 20 MEQ tablet Take 1 tablet (20 mEq total) by mouth daily. 02/27/12  Yes Corwin Levins, MD  predniSONE (DELTASONE) 5 MG tablet Take 5 mg by mouth daily. 08/04/12  Yes Coralyn Helling, MD  temazepam (RESTORIL) 30 MG capsule Take 1 capsule (30 mg total) by mouth at bedtime. 02/27/12 11/28/13 Yes Corwin Levins, MD  tiotropium (SPIRIVA) 18 MCG inhalation capsule Place 1 capsule (18 mcg total) into inhaler and inhale daily. 02/27/12 02/26/13 Yes Corwin Levins, MD  vitamin B-12 (CYANOCOBALAMIN) 100 MCG tablet Take 50 mcg by mouth daily.     Yes Historical Provider, MD  vitamin E 600 UNIT capsule Take 600 Units by mouth 2 (two) times daily.     Yes Historical Provider, MD  nitroGLYCERIN (NITROSTAT) 0.4 MG SL tablet Place 1 tablet (0.4  mg total) under the tongue every 5 (five) minutes as needed for chest pain. 08/14/12   Cassell Clement, MD    Inpatient Medications:  . albuterol  2.5 mg Nebulization QID  . aspirin EC  81 mg Oral Daily  . atorvastatin  40 mg Oral QHS  . citalopram  10 mg Oral Daily  . diltiazem  240 mg Oral Daily  . ezetimibe  10 mg Oral Daily  . furosemide  80 mg Intravenous Q8H  . guaiFENesin  1,200 mg Oral BID  . heparin  5,000 Units Subcutaneous Q8H  . hydrALAZINE  50 mg Oral BID  . insulin aspart  0-9 Units Subcutaneous TID WC  . ipratropium  0.5 mg Nebulization QID  . magnesium oxide  400 mg Oral Daily  . metoprolol succinate  50 mg Oral BID WC  . mometasone-formoterol  2 puff Inhalation BID  . pantoprazole  40 mg Oral Daily  . piperacillin-tazobactam (ZOSYN)  IV  3.375 g Intravenous Q8H  . predniSONE  5 mg Oral Daily  . sodium chloride  3 mL Intravenous Q12H  . tiotropium  18 mcg Inhalation Daily  . vancomycin  1,250 mg Intravenous Q24H      Allergies: No Known Allergies  History   Social History  . Marital Status: Married    Spouse Name: N/A    Number of Children: 3  . Years of Education: N/A   Occupational History  . RETIRED     truck driver   Social History Main Topics  . Smoking status: Former Smoker -- 1.50 packs/day for 50 years    Types: Cigarettes    Quit date: 06/28/1996  .  Smokeless tobacco: Current User    Types: Chew  . Alcohol Use: Yes     Comment: 2 beers 2-3 times /week  . Drug Use: No  . Sexually Active: Not Currently   Other Topics Concern  . Not on file   Social History Narrative  . No narrative on file     Family History  Problem Relation Age of Onset  . Heart disease Father   . Lung cancer Mother   . Cancer Other     lung cancer  . Heart disease Other   . Hypertension Other      Review of Systems: General: see above regarding subjective fever. +weight changes as above. Afebrile here.  Cardiovascular: Positive for edema, orthopnea,  paroxysmal nocturnal dyspnea, shortness of breath or dyspnea on exertion. negative for chest pain, palpitations Dermatological: negative for rash Respiratory: see above Urologic: negative for hematuria Abdominal: Positive for nausea, negative for  vomiting, diarrhea, bright red blood per rectum, melena, or hematemesis Neurologic: negative for visual changes, syncope, or dizziness All other systems reviewed and are otherwise negative except as noted above.  Labs:  Recent Labs  08/27/12 0350 08/27/12 0830  TROPONINI <0.30 0.81*   Lab Results  Component Value Date   WBC 20.9* 08/27/2012   HGB 12.0* 08/27/2012   HCT 36.1* 08/27/2012   MCV 93.5 08/27/2012   PLT 284 08/27/2012    Recent Labs Lab 08/25/12 1653 08/27/12 0349  NA 139 138  K 4.1 3.7  CL 102 98  CO2 29 28  BUN 33* 33*  CREATININE 2.4* 2.10*  CALCIUM 8.9 8.8  PROT 6.2  --   BILITOT 0.8  --   ALKPHOS 76  --   ALT 25  --   AST 23  --   GLUCOSE 172* 196*   Lab Results  Component Value Date   CHOL 156 08/25/2012   HDL 53.60 08/25/2012   LDLCALC 84 08/25/2012   TRIG 94.0 08/25/2012    Radiology/Studies:  Dg Chest Portable 1 View  08/27/2012   *RADIOLOGY REPORT*  Clinical Data: Shortness of breath.  History of lung cancer post lobectomy.  PORTABLE CHEST - 1 VIEW  Comparison: 02/02/2012  Findings: Shallow inspiration.  Cardiac enlargement with pulmonary vascular congestion and perihilar edema.  Bilateral pleural effusions.  Atelectasis in the lung bases.  No pneumothorax. Changes have developed since the previous study.  Postoperative changes in the right chest.  Stable appearance of cardiac pacemaker and left central venous catheter.  Calcified and tortuous aorta.  IMPRESSION: Interval development of cardiac enlargement, pulmonary vascular congestion, perihilar edema, and bilateral pleural effusions.   Original Report Authenticated By: Burman Nieves, [name]    EKG: atrial fibrillation 115bpm nonspecific changes - baseline  wander/artifact  Physical Exam Blood pressure 141/98, pulse 93, temperature 98.1 F (36.7 C), temperature source Oral, resp. rate 21, height 5\' 7"  (1.702 m), weight 206 lb 12.7 oz (93.8 kg), SpO2 99.00%. General: Well developed, well nourished WM in no acute distress. Head: Normocephalic, atraumatic, sclera non-icteric, no xanthomas, nares are without discharge.  Neck: JVD not elevated. Lungs: Decreased BS at L base with coarse breath sounds, less diminished at R base, moderate air movement without wheezes, or rhonchi. BiPap but comfortable. No accessory muscle usage. Heart: irregularly irregular and mildly increased rate with S1 S2. No murmurs, rubs, or gallops appreciated. Abdomen: Soft, non-tender, non-distended with normoactive bowel sounds. No hepatomegaly. No rebound/guarding. No obvious abdominal masses. Msk:  Strength and tone appear normal for age. Extremities:  No clubbing or cyanosis. 2+ lower extremity edema bilaterally.  Distal pedal pulses are 2+ and equal bilaterally. Neuro: Alert and oriented X 3. No facial asymmetry. No focal deficit. Moves all extremities spontaneously. Psych:  Responds to questions appropriately with a normal affect.     Assessment and Plan:  1. Acute on chronic respiratory failure (on 4L/O2 at home, required bipap here) 2. Acute on chronic diastolic CHF: Patient with elevated BNP, lower extremity edema, sob, orthopnea and early satiety.  3. COPD exacerbation, on home O2, chronic prednisone 4. Chronic renal insufficiency 5. Elevated troponin with h/o nonobstructive CAD, likely due to demand ischemia 6. H/o EtOH abuse 7. H/o lung cancer with recent weight loss 8. Paroxysmal atrial fibrillation with mildly elevated HR 9. Hypertension 10. Leukocytosis   Agree that a/c respiratory failure likely exacerbated by COPD exacerbation and acute CHF exacerbation. Agree with abx. Given that his UOP has not picked up we will change him to Lasix gtt at 10/hr. Will  need to watch Cr closely. Strict I/O's, daily weights. Precipitating event of CHF is unclear, ? related to recent steroid taper versus elevated rates of atrial fibrillation. Suspect his rates will improve with fluid removal. If not, would push up diltiazem. 2D Echo is pending. Elevated troponin is felt due to demand ischemia at this time - will follow clinically. He has h/o PAF seen on pacer before, but had not been on anticoagulation due to unclear duration - he returns in afib this admission. CHA2DS2VAasc score is currently 6 thus we feel he requires anticoagulation for high thromboembolisk risk. Will start heparin per pharmacy. If he remains stable would consider transitioning to Coumadin (would be a less ideal candidate for NOAC given renal dysfunction). Will ask [employer] to interrogate pacemaker.  He should also be evaluated for the overall net weight loss he has sustained since February.    Signed, Ronie Spies PA-C 08/27/2012, 10:54 AM  Attending Note:   The patient was seen and examined.  Agree with assessment and plan as noted above.  Changes made to the above note as needed. Pt has significant dyspnea due to COPD and CHF.    He now has atrial fib.  I suspect that his heart rate will improve as he generally gets better.   Vesta Mixer, Montez Hageman., MD, [facility] 09/04/2012, 2:16 PM

## 2012-08-27 NOTE — ED Notes (Signed)
Attempted report 

## 2012-08-27 NOTE — H&P (Signed)
[facility] History and Physical  [name] ZOX:[id number] DOB: 08-04-1932 DOA: 08/27/2012  Referring physician: Lyanne Co, MD PCP: [name], MD   Chief Complaint: SOB  HPI: [name] Newcomer Sr. is a 79 y.o. male with past medical history of chronic respiratory failure on 4 L of oxygen at home secondary to COPD, OSA, CHF, A. fib and CKD. Patient came into the hospital because of shortness of breath. The history obtained mainly from his wife as he is on BiPAP. Patient reported having shortness of breath for the past 4-5 days, this is associated with some cough, and subjective fevers. Patient was recently started on doxycycline for presumed cellulitis around his left heel. The shortness of breath progressively gotten worse so he came into the hospital last night with severe SOB. Patient placed on BiPAP, initial evaluation in the ED showed chest x-ray negative for pneumonia, there is small bilateral effusions and vascular congestion consistent with CHF. Patient also has WBC count of 21K. Patient admitted to the hospital for further evaluation.  Review of Systems:  Review of systems can be obtained, patient is on BiPAP.  Past Medical History  Diagnosis Date  . Stricture and stenosis of esophagus 12/07/2004    EGD done on 12/07/2004  . GERD (gastroesophageal reflux disease)   . Hypoxemia   . Increased prostate specific antigen (PSA) velocity 02/26/2011  . DM (diabetes mellitus) 02/26/2011  . Gout 02/26/2011  . COPD (chronic obstructive pulmonary disease)     oxygen at home, 3L 24/7  . OSA (obstructive sleep apnea) 02/26/2011    CPAP  . Atrial fibrillation   . Depression   . Cardiac arrest 2009    while in hospital   . Coronary artery disease     non obstructive cath 2006  . Hypertension   . HTN (hypertension) 02/26/2011  . Pacemaker   . Cardiac arrest 2009    while in hospital  . Cancer     lung s/p lobectomy x2 on RT lung  . Basal cell carcinoma of nose  02/26/2011  . Oxygen dependent   . ETOH abuse 01/29/2012    started drinking again - hx cardiac arrest in 2009 due to dt's with organ failure  . CKD (chronic kidney disease) 02/28/2012   Past Surgical History  Procedure Laterality Date  . Lung lobectomy      x2 lobes on RT lung  . Knee arthroscopy      bilateral  . Pace maker    . Tonsillectomy    . Hernia repair      x2, esophageal  . Cardiac catheterization    . Esophagogastroduodenoscopy  10/25/2011    Procedure: ESOPHAGOGASTRODUODENOSCOPY (EGD);  Surgeon: Hart Carwin, MD;  Location: [facility];  Service: Endoscopy;  Laterality: N/A;   Social History:  reports that he quit smoking about 16 years ago. His smoking use included Cigarettes. He has a 75 pack-year smoking history. His smokeless tobacco use includes Chew. He reports that  drinks alcohol. He reports that he does not use illicit drugs.  No Known Allergies  Family History  Problem Relation Age of Onset  . Heart disease Father   . Lung cancer Mother   . Cancer Other     lung cancer  . Heart disease Other   . Hypertension Other     Prior to Admission medications   Medication Sig Start Date End Date Taking? Authorizing Provider  albuterol (PROVENTIL HFA;VENTOLIN HFA) 108 (90 BASE) MCG/ACT inhaler Inhale  2 puffs into the lungs every 6 (six) hours as needed for wheezing. 02/27/12 02/26/13 Yes Corwin Levins, MD  allopurinol (ZYLOPRIM) 100 MG tablet Take 1 tablet (100 mg total) by mouth daily. 05/14/12  Yes Corwin Levins, MD  ALPRAZolam Prudy Feeler) 0.5 MG tablet Take 1 tablet (0.5 mg total) by mouth 3 (three) times daily as needed. For anxiety/sleeplessness. 04/28/12  Yes Corwin Levins, MD  aspirin 81 MG tablet Take 81 mg by mouth daily.     Yes Historical Provider, MD  atorvastatin (LIPITOR) 40 MG tablet Take 40 mg by mouth at bedtime. 02/27/12  Yes Corwin Levins, MD  budesonide-formoterol [facility]) 160-4.5 MCG/ACT inhaler Inhale 2 puffs into the lungs 2 (two) times daily.  02/27/12 02/26/13 Yes Corwin Levins, MD  citalopram (CELEXA) 10 MG tablet Take 1 tablet (10 mg total) by mouth daily. 06/17/12 06/17/13 Yes Corwin Levins, MD  colchicine 0.6 MG tablet Take 1 tablet (0.6 mg total) by mouth daily as needed. For gout flares 02/27/12  Yes Corwin Levins, MD  diltiazem [facility]) 240 MG 24 hr capsule Take 1 capsule (240 mg total) by mouth daily. 02/27/12  Yes Corwin Levins, MD  doxycycline (VIBRA-TABS) 100 MG tablet Take 1 tablet (100 mg total) by mouth 2 (two) times daily. 08/25/12  Yes Corwin Levins, MD  ezetimibe (ZETIA) 10 MG tablet Take 10 mg by mouth daily.   Yes Historical Provider, MD  fish oil-omega-3 fatty acids 1000 MG capsule Take 2 g by mouth daily.     Yes Historical Provider, MD  Fluticasone-Salmeterol (ADVAIR) 500-50 MCG/DOSE AEPB Inhale 1 puff into the lungs every 12 (twelve) hours.   Yes Historical Provider, MD  furosemide (LASIX) 40 MG tablet 80-120 mg 2 (two) times daily. 120 mg in the morning and 80 mg in the afternoon 08/04/12  Yes Coralyn Helling, MD  hydrALAZINE (APRESOLINE) 50 MG tablet Take 1 tablet (50 mg total) by mouth 2 (two) times daily. 07/13/12  Yes Corwin Levins, MD  HYDROcodone-acetaminophen (NORCO) 7.5-325 MG per tablet Take 1 tablet by mouth every 6 (six) hours as needed for pain. 08/25/12  Yes Corwin Levins, MD  hydroxypropyl methylcellulose (ISOPTO TEARS) 2.5 % ophthalmic solution Place 2 drops into both eyes as needed. For dry/irritated eyes.   Yes Historical Provider, MD  magnesium oxide (DIASENSE MAGNESIUM) 400 (241.3 MG) MG tablet Take 1 tablet (400 mg total) by mouth daily. 02/27/12  Yes Corwin Levins, MD  metFORMIN (GLUCOPHAGE) 500 MG tablet Take 500 mg by mouth 2 (two) times daily with a meal.   Yes Historical Provider, MD  metoprolol succinate (TOPROL-XL) 50 MG 24 hr tablet Take 50 mg by mouth 2 (two) times daily. Take with or immediately following a meal.   Yes Historical Provider, MD  Multiple Vitamins-Minerals (EYE VITAMINS) TABS Take 1 tablet by  mouth 2 (two) times daily.     Yes Historical Provider, MD  multivitamin [facility]) per tablet Take 1 tablet by mouth daily.     Yes Historical Provider, MD  omeprazole (PRILOSEC) 20 MG capsule Take 1 tablet by mouth once daily. PHARMACY-PLEASE D/C NEXIUM RX...TOO EXPENSIVE FOR PATIENT. 04/23/12  Yes Hart Carwin, MD  potassium chloride SA (K-DUR,KLOR-CON) 20 MEQ tablet Take 1 tablet (20 mEq total) by mouth daily. 02/27/12  Yes Corwin Levins, MD  predniSONE (DELTASONE) 5 MG tablet Take 5 mg by mouth daily. 08/04/12  Yes Coralyn Helling, MD  temazepam (RESTORIL) 30 MG capsule Take  1 capsule (30 mg total) by mouth at bedtime. 02/27/12 11/28/13 Yes Corwin Levins, MD  tiotropium (SPIRIVA) 18 MCG inhalation capsule Place 1 capsule (18 mcg total) into inhaler and inhale daily. 02/27/12 02/26/13 Yes Corwin Levins, MD  vitamin B-12 (CYANOCOBALAMIN) 100 MCG tablet Take 50 mcg by mouth daily.     Yes Historical Provider, MD  vitamin E 600 UNIT capsule Take 600 Units by mouth 2 (two) times daily.     Yes Historical Provider, MD  nitroGLYCERIN (NITROSTAT) 0.4 MG SL tablet Place 1 tablet (0.4 mg total) under the tongue every 5 (five) minutes as needed for chest pain. 08/14/12   Cassell Clement, MD   Physical Exam: Filed Vitals:   08/27/12 0530 08/27/12 0545 08/27/12 0630 08/27/12 0715  BP: 131/77 134/82 147/68 132/75  Pulse: 103 104 94 106  Temp:      TempSrc:      Resp: 23 24 21 22   SpO2: 100% 100% 100% 100%   General appearance: alert, BiPAP mask on his face, minimal distress  Head: Normocephalic, without obvious abnormality, atraumatic  Eyes: conjunctivae/corneas clear. PERRL, EOM's intact. Fundi benign.  Nose: Nares normal. Septum midline. Mucosa normal. No drainage or sinus tenderness.  Throat: lips, mucosa, and tongue normal; teeth and gums normal  Neck: Supple, no masses, no cervical lymphadenopathy, no JVD appreciated, no meningeal signs Resp: Very minimal air entry bilaterally. Chest wall: no  tenderness  Cardio: regular rate and rhythm, S1, S2 normal, no murmur, click, rub or gallop  GI: soft, non-tender; bowel sounds normal; no masses, no organomegaly  Extremities: +2 lower extremity edema Skin: Skin color, texture, turgor normal. No rashes or lesions  Neurologic: Alert and oriented X 3, normal strength and tone. Normal symmetric reflexes. Normal coordination and gait  Labs on Admission:  Basic Metabolic Panel:  Recent Labs Lab 08/25/12 1653 08/27/12 0349  NA 139 138  K 4.1 3.7  CL 102 98  CO2 29 28  GLUCOSE 172* 196*  BUN 33* 33*  CREATININE 2.4* 2.10*  CALCIUM 8.9 8.8   Liver Function Tests:  Recent Labs Lab 08/25/12 1653  AST 23  ALT 25  ALKPHOS 76  BILITOT 0.8  PROT 6.2  ALBUMIN 3.1*   No results found for this basename: LIPASE, AMYLASE,  in the last 168 hours No results found for this basename: AMMONIA,  in the last 168 hours CBC:  Recent Labs Lab 08/25/12 1653 08/27/12 0349  WBC 14.6* 20.9*  NEUTROABS 12.6* 17.0*  HGB 12.6* 12.0*  HCT 38.2* 36.1*  MCV 96.4 93.5  PLT 278.0 284   Cardiac Enzymes:  Recent Labs Lab 08/27/12 0350  TROPONINI <0.30    BNP (last 3 results)  Recent Labs  01/29/12 1750 01/31/12 0330 08/27/12 0350  PROBNP 3379.0* 1901.0* 3385.0*   CBG: No results found for this basename: GLUCAP,  in the last 168 hours  Radiological Exams on Admission: Dg Chest Portable 1 View  08/27/2012   *RADIOLOGY REPORT*  Clinical Data: Shortness of breath.  History of lung cancer post lobectomy.  PORTABLE CHEST - 1 VIEW  Comparison: 02/02/2012  Findings: Shallow inspiration.  Cardiac enlargement with pulmonary vascular congestion and perihilar edema.  Bilateral pleural effusions.  Atelectasis in the lung bases.  No pneumothorax. Changes have developed since the previous study.  Postoperative changes in the right chest.  Stable appearance of cardiac pacemaker and left central venous catheter.  Calcified and tortuous aorta.   IMPRESSION: Interval development of cardiac enlargement, pulmonary vascular  congestion, perihilar edema, and bilateral pleural effusions.   Original Report Authenticated By: Burman Nieves, [name]    EKG: Independently reviewed.   Assessment/Plan Principal Problem:   Acute-on-chronic respiratory failure Active Problems:   CARCINOMA, LUNG, SQUAMOUS CELL   CHRONIC OBSTRUCTIVE PULMONARY DISEASE, SEVERE   OSA (obstructive sleep apnea)   DM (diabetes mellitus)   CKD (chronic kidney disease)   COPD (chronic obstructive pulmonary disease)   CHF (congestive heart failure)   Acute on chronic respiratory failure -Likely secondary to both COPD exacerbation and acute on chronic CHF. -Patient usually on 4 L of oxygen at home, placed on BiPAP, continuous. -Will be placed on stepdown for close monitoring, he is full code.  Acute COPD exacerbation -Patient placed on IV antibiotics, IV vancomycin and IV Zosyn. -Start patient on high dose steroids, inhaled bronchodilators, mucolytics and antitussives. -Very minimal air entry, keep patient on BiPAP.  Acute on chronic CHF -Last echocardiogram in 05/2011 showed ejection fraction of 58%, grade 1 diastolic dysfunction. -The fluid retention probably also mediated by his CKD. -Started on IV Lasix 80 mg every 8 hours, monitor intake and output. -Patient was reported he needed dialysis in the past while he was in the hospital. -Expect his renal function to deteriorate, if so probably he will need nephrology consultation.  Diabetes mellitus type 2 -Currently is unable to eat because of continuous BiPAP, advanced to CHO modified diet. -Utilize insulin sliding scale, hold metformin.  History of squamous cell lung cancer -Treated in 2006 by surgery.  Code Status:  full code Family Communication:  plan discussed with his wife at bedside.  Disposition Plan:  stepdown, inpatient, anticipate length of stay to be greater than 2 med nights   Time spent:  70  minutes  [facility] Pager [phone]  If 7PM-7AM, please contact night-coverage [email or web address] Password [facility] 08/27/2012, 7:57 AM

## 2012-08-28 ENCOUNTER — Ambulatory Visit: Payer: Medicare Other | Admitting: Internal Medicine

## 2012-08-28 DIAGNOSIS — I509 Heart failure, unspecified: Secondary | ICD-10-CM

## 2012-08-28 DIAGNOSIS — J9620 Acute and chronic respiratory failure, unspecified whether with hypoxia or hypercapnia: Secondary | ICD-10-CM

## 2012-08-28 DIAGNOSIS — D72829 Elevated white blood cell count, unspecified: Secondary | ICD-10-CM | POA: Diagnosis present

## 2012-08-28 DIAGNOSIS — I4891 Unspecified atrial fibrillation: Secondary | ICD-10-CM

## 2012-08-28 DIAGNOSIS — J449 Chronic obstructive pulmonary disease, unspecified: Secondary | ICD-10-CM

## 2012-08-28 DIAGNOSIS — I5021 Acute systolic (congestive) heart failure: Secondary | ICD-10-CM

## 2012-08-28 DIAGNOSIS — G4733 Obstructive sleep apnea (adult) (pediatric): Secondary | ICD-10-CM

## 2012-08-28 DIAGNOSIS — R748 Abnormal levels of other serum enzymes: Secondary | ICD-10-CM | POA: Diagnosis present

## 2012-08-28 LAB — CBC
MCV: 92.1 fL (ref 78.0–100.0)
Platelets: 256 10*3/uL (ref 150–400)
RBC: 3.55 MIL/uL — ABNORMAL LOW (ref 4.22–5.81)
RDW: 15.9 % — ABNORMAL HIGH (ref 11.5–15.5)
WBC: 21.3 10*3/uL — ABNORMAL HIGH (ref 4.0–10.5)

## 2012-08-28 LAB — BASIC METABOLIC PANEL
BUN: 42 mg/dL — ABNORMAL HIGH (ref 6–23)
Chloride: 97 mEq/L (ref 96–112)
GFR calc Af Amer: 30 mL/min — ABNORMAL LOW (ref >90)
GFR calc non Af Amer: 25 mL/min — ABNORMAL LOW (ref >90)
Potassium: 3.1 mEq/L — ABNORMAL LOW (ref 3.5–5.1)
Sodium: 137 mEq/L (ref 135–145)

## 2012-08-28 LAB — HEPARIN LEVEL (UNFRACTIONATED): Heparin Unfractionated: 0.7 IU/mL (ref 0.30–0.70)

## 2012-08-28 LAB — PRO B NATRIURETIC PEPTIDE: Pro B Natriuretic peptide (BNP): 6086 pg/mL — ABNORMAL HIGH (ref 0–450)

## 2012-08-28 MED ORDER — BUDESONIDE 0.5 MG/2ML IN SUSP
0.5000 mg | Freq: Two times a day (BID) | RESPIRATORY_TRACT | Status: DC
  Administered 2012-08-28 – 2012-09-08 (×21): 0.5 mg via RESPIRATORY_TRACT
  Filled 2012-08-28 (×24): qty 2

## 2012-08-28 MED ORDER — DILTIAZEM HCL ER COATED BEADS 180 MG PO CP24
180.0000 mg | ORAL_CAPSULE | Freq: Two times a day (BID) | ORAL | Status: DC
  Administered 2012-08-28 – 2012-09-16 (×37): 180 mg via ORAL
  Filled 2012-08-28 (×48): qty 1

## 2012-08-28 MED ORDER — TRAZODONE HCL 50 MG PO TABS
50.0000 mg | ORAL_TABLET | Freq: Every evening | ORAL | Status: DC | PRN
  Administered 2012-08-28 – 2012-09-15 (×13): 50 mg via ORAL
  Filled 2012-08-28 (×12): qty 1

## 2012-08-28 MED ORDER — LEVALBUTEROL HCL 0.63 MG/3ML IN NEBU
0.6300 mg | INHALATION_SOLUTION | RESPIRATORY_TRACT | Status: DC | PRN
  Administered 2012-08-28 – 2012-09-11 (×4): 0.63 mg via RESPIRATORY_TRACT
  Filled 2012-08-28 (×3): qty 3

## 2012-08-28 MED ORDER — LEVALBUTEROL HCL 0.63 MG/3ML IN NEBU
0.6300 mg | INHALATION_SOLUTION | Freq: Four times a day (QID) | RESPIRATORY_TRACT | Status: DC | PRN
  Filled 2012-08-28: qty 3

## 2012-08-28 MED ORDER — POTASSIUM CHLORIDE CRYS ER 20 MEQ PO TBCR
40.0000 meq | EXTENDED_RELEASE_TABLET | Freq: Two times a day (BID) | ORAL | Status: DC
  Administered 2012-08-28: 40 meq via ORAL
  Filled 2012-08-28 (×3): qty 2

## 2012-08-28 MED ORDER — ARFORMOTEROL TARTRATE 15 MCG/2ML IN NEBU
15.0000 ug | INHALATION_SOLUTION | Freq: Two times a day (BID) | RESPIRATORY_TRACT | Status: DC
  Administered 2012-08-28 – 2012-09-16 (×35): 15 ug via RESPIRATORY_TRACT
  Filled 2012-08-28 (×45): qty 2

## 2012-08-28 NOTE — Progress Notes (Signed)
[facility] Progress Note [name] Stevenson TEAM 1 - Stepdown/ICU TEAM   Beverely Pace Poteat Sr. [name] Stevenson:[id number] DOB: [birth year] DOA: 08/27/2012 PCP: [name], MD  Brief narrative: 79 y.o. male with past medical history of chronic respiratory failure on 4 L of oxygen at home secondary to COPD, OSA, CHF, A. fib and CKD. Patient came into the hospital because of shortness of breath. He reported having shortness of breath for 4-5 days, associated with some cough, and subjective fevers. He was recently started on doxycycline for presumed cellulitis of left heel. The shortness of breath progressively worsened prompting him to seek medical care because of severe SOB. He was placed on BiPAP. In the ED his chest x-ray was negative for pneumonia but theres were small bilateral effusions and vascular congestion consistent with CHF. Labs revealed significant leukocytosis of 21,000.   Assessment/Plan:  Acute-on-chronic respiratory failure with hypoxia due to:   CHF (congestive heart failure)/? diastolic dysfunction   CHRONIC OBSTRUCTIVE PULMONARY DISEASE, SEVERE   ? Atypical PNA -has been weaned back to home dose oxygen -has diuresed 1700 cc since admit -Cards managing HF and Lasix infusion -could have atypical pulmonary infection, but doubt - will d/c abx and follow clinically  -cont supportive care with nebs/MDI and home dose Prednisone -no wheeze so doubt COPD exacerbation  CKD (chronic kidney disease), stage III -baseline is 28/1.8 -so far renal function has actually worsened with diuresis but also has not diuresed significant amounts so need to continue current treatment for now    Leukocytosis -etiology unclear so will continue broad spectrum anbx's -Both legs reddened but exam without pain which would be expected if had severe cellulitis given very high WBC -UA on the 10th with 11-20 WBC with hyaline casts and trace leukocytes and UA 6/12 unremarkable BUT he laso was on Doxycyclne pre admit so UA  appearance could be blunted -FU on all cx's  CORONARY ARTERY DISEASE (nonobstructive disease 2006 catheterization)/Elevation of cardiac enzymes -no CP and likely due to acute HF, demand ischemia from recent hypoxia and worsened renal function  HTN (hypertension) -BP controlled -cont home meds  DM (diabetes mellitus) type II controlled with renal manifestation -controlled  -cont SSI  OSA (obstructive sleep apnea)  Atrial fibrillation with controlled ventricular rates - recent onset -cont BB and CCB  ? Cellulitis of left ankle -has redness both legs c/w stasis dermatitis  Hypokalemia Due to lasix - replace and follow   NSCLC s/p RULectomy and adjuvant chemo in 2006  DVT prophylaxis: On full dose heparin infusion Code Status: Full Family Communication: Patient and wife at bedside Disposition Plan: Transfer to telemetry  Isolation: Contact isolation for MRSA positive PCR  Consultants: Cardiology PCCM (per Cards)  Procedures: 2-D echocardiogram - Left ventricle: The cavity size was normal. Wall thickness was increased in a pattern of moderate LVH. Systolic function was vigorous. The estimated ejection fraction was in the range of 65% to 70%. Wall motion was normal; there were no regional wall motion abnormalities. - Left atrium: The atrium was mildly to moderately dilated. - Right atrium: The atrium was mildly to moderately dilated.   Antibiotics: Levaquin 6/11 Zosyn 6/12 >>>6/13 Vancomycin 6/12 >>>6/13  HPI/Subjective: Patient endorses feels much better especially breathing better.  Objective: Blood pressure 138/73, pulse 92, temperature 98 F (36.7 C), temperature source Oral, resp. rate 20, height 5\' 7"  (1.702 m), weight 94 kg (207 lb 3.7 oz), SpO2 98.00%.  Intake/Output Summary (Last 24 hours) at 08/28/12 1302 Last data filed at 08/28/12 0600  Gross per 24 hour  Intake  877.5 ml  Output   1225 ml  Net -347.5 ml     Exam: General: No acute  respiratory distress at rest  Lungs: Clear to auscultation bilaterally without wheezes or crackles,  4L Cardiovascular: Regular rate without murmur gallop or rub normal S1 and S2, 2-3+ bilateral lower extremity peripheral edema  Abdomen: Nontender, nondistended, soft, bowel sounds positive, no rebound, no ascites, no appreciable mass Musculoskeletal: No significant cyanosis, clubbing of bilateral lower extremities Neurological: Alert and oriented x 3, moves all extremities x 4 without focal neurological deficits, CN 2-12 intact  Scheduled Meds: Scheduled Meds: . albuterol  2.5 mg Nebulization QID  . aspirin EC  81 mg Oral Daily  . atorvastatin  40 mg Oral QHS  . Chlorhexidine Gluconate Cloth  6 each Topical Q0600  . citalopram  10 mg Oral Daily  . diltiazem  240 mg Oral Daily  . ezetimibe  10 mg Oral Daily  . guaiFENesin  1,200 mg Oral BID  . hydrALAZINE  50 mg Oral BID  . insulin aspart  0-9 Units Subcutaneous TID WC  . magnesium oxide  400 mg Oral Daily  . metoprolol succinate  50 mg Oral BID WC  . mometasone-formoterol  2 puff Inhalation BID  . mupirocin ointment  1 application Nasal BID  . pantoprazole  40 mg Oral Daily  . piperacillin-tazobactam (ZOSYN)  IV  3.375 g Intravenous Q8H  . predniSONE  5 mg Oral Daily  . sodium chloride  3 mL Intravenous Q12H  . tiotropium  18 mcg Inhalation Daily  . vancomycin  1,250 mg Intravenous Q24H   Continuous Infusions: . furosemide (LASIX) infusion 10 mg/hr (08/27/12 1416)  . heparin 1,250 Units/hr (08/27/12 1624)    Data Reviewed: Basic Metabolic Panel:  Recent Labs Lab 08/25/12 1653 08/27/12 0349 08/28/12 0500  NA 139 138 137  K 4.1 3.7 3.1*  CL 102 98 97  CO2 29 28 27   GLUCOSE 172* 196* 153*  BUN 33* 33* 42*  CREATININE 2.4* 2.10* 2.32*  CALCIUM 8.9 8.8 8.3*  MG  --   --  1.9   Liver Function Tests:  Recent Labs Lab 08/25/12 1653  AST 23  ALT 25  ALKPHOS 76  BILITOT 0.8  PROT 6.2  ALBUMIN 3.1*    CBC:  Recent Labs Lab 08/25/12 1653 08/27/12 0349 08/28/12 0500  WBC 14.6* 20.9* 21.3*  NEUTROABS 12.6* 17.0*  --   HGB 12.6* 12.0* 11.0*  HCT 38.2* 36.1* 32.7*  MCV 96.4 93.5 92.1  PLT 278.0 284 256   Cardiac Enzymes:  Recent Labs Lab 08/27/12 0350 08/27/12 0830 08/27/12 1405 08/27/12 1958  TROPONINI <0.30 0.81* 1.36* 1.71*   BNP (last 3 results)  Recent Labs  01/31/12 0330 08/27/12 0350 08/28/12 0500  PROBNP 1901.0* 3385.0* 6086.0*   CBG:  Recent Labs Lab 08/27/12 1243 08/27/12 1631 08/27/12 2135 08/28/12 0802 08/28/12 1221  GLUCAP 206* 198* 289* 143* 139*    Recent Results (from the past 240 hour(s))  CULTURE, BLOOD (ROUTINE X 2)     Status: None   Collection Time    08/27/12  8:30 AM      Result Value Range Status   Specimen Description BLOOD LEFT HAND   Final   Special Requests BOTTLES DRAWN AEROBIC ONLY 5CC   Final   Culture  Setup Time 08/27/2012 14:16   Final   Culture     Final   Value:  BLOOD CULTURE RECEIVED NO GROWTH TO DATE CULTURE WILL BE HELD FOR 5 DAYS BEFORE ISSUING A FINAL NEGATIVE REPORT   Report Status PENDING   Incomplete  MRSA PCR SCREENING     Status: Abnormal   Collection Time    08/27/12  8:38 AM      Result Value Range Status   MRSA by PCR POSITIVE (*) NEGATIVE Final   Comment:            The GeneXpert MRSA Assay (FDA     approved for NASAL specimens     only), is one component of a     comprehensive MRSA colonization     surveillance program. It is not     intended to diagnose MRSA     infection nor to guide or     monitor treatment for     MRSA infections.     RESULT CALLED TO, READ BACK BY AND VERIFIED WITH:     Esperanza Richters AT 1148 08/27/12 BY K BARR  CULTURE, BLOOD (ROUTINE X 2)     Status: None   Collection Time    08/27/12  8:40 AM      Result Value Range Status   Specimen Description BLOOD LEFT HAND   Final   Special Requests BOTTLES DRAWN AEROBIC ONLY 4CC   Final   Culture  Setup Time 08/27/2012  14:16   Final   Culture     Final   Value:        BLOOD CULTURE RECEIVED NO GROWTH TO DATE CULTURE WILL BE HELD FOR 5 DAYS BEFORE ISSUING A FINAL NEGATIVE REPORT   Report Status PENDING   Incomplete     Studies:  Recent x-ray studies have been reviewed in detail by the Attending Physician  Scheduled Meds:  Reviewed in detail by the Attending Physician   Junious Silk, ANP [facility] Office  [phone] Pager [phone]  **If unable to reach the above provider after paging please contact the Flow Manager @ [phone]  On-Call/Text Page:      [email or web address]      password [email or web address]  If 7PM-7AM, please contact night-coverage [email or web address] Password [email or web address] 08/28/2012, 1:02 PM   LOS: 1 day   I have personally examined this patient and reviewed the entire database. I have reviewed the above note, made any necessary editorial changes, and agree with its content.  Lonia Blood, MD [facility]

## 2012-08-28 NOTE — Progress Notes (Addendum)
ANTICOAGULATION CONSULT NOTE - Follow-Up Consult  Pharmacy Consult for Heparin Indication: atrial fibrillation  No Known Allergies  Patient Measurements: Height: 5\' 7"  (170.2 cm) Weight: 207 lb 3.7 oz (94 kg) IBW/kg (Calculated) : 66.1 Heparin Dosing Weight: 86kg  Vital Signs: Temp: 98 F (36.7 C) (06/13 0439) Temp src: Oral (06/13 0439) BP: 138/73 mmHg (06/13 1024) Pulse Rate: 92 (06/13 0439)  Labs:  Recent Labs  08/25/12 1653 08/27/12 0349  08/27/12 0830 08/27/12 1405 08/27/12 1438 08/27/12 1958 08/27/12 2330 08/28/12 0500  HGB 12.6* 12.0*  --   --   --   --   --   --  11.0*  HCT 38.2* 36.1*  --   --   --   --   --   --  32.7*  PLT 278.0 284  --   --   --   --   --   --  256  APTT  --   --   --   --   --  33  --   --   --   LABPROT  --   --   --   --   --  13.5  --   --   --   INR  --   --   --   --   --  1.04  --   --   --   HEPARINUNFRC  --   --   --   --   --   --   --  0.62 0.70  CREATININE 2.4* 2.10*  --   --   --   --   --   --  2.32*  TROPONINI  --   --   < > 0.81* 1.36*  --  1.71*  --   --   < > = values in this interval not displayed.  Estimated Creatinine Clearance: 29.2 ml/min (by C-G formula based on Cr of 2.32).  Assessment: 79yom on heparin for Afib. Heparin level 0.70 ( therapeutic) on 1250 units/hr. No bleeding noted. Hg down to 11 from 12.    Goal of Therapy:  Heparin level 0.3-0.7 units/ml Monitor platelets by anticoagulation protocol: Yes   Plan:  1. Continue Heparin drip at 1250 units/hr 2. Daily heparin level and CBC  Herby Abraham, Pharm.D. [phone] 08/28/2012 11:28 AM

## 2012-08-28 NOTE — Consult Note (Signed)
PULMONARY  / CRITICAL CARE MEDICINE  Name: [name] AGUSTIN Sr. MRN: [id number] DOB: Apr 01, 1932    ADMISSION DATE:  08/27/2012 CONSULTATION DATE:  08/28/2012  REFERRING MD :  Berton Mount PRIMARY SERVICE:  [facility]  CHIEF COMPLAINT:  Short of breath  BRIEF PATIENT DESCRIPTION:  79 yo male former smoker presented with severe dyspnea from acute pulmonary edema, acute diastolic dysfunction, and new onset A fib with RVR.  He is followed by Dr. [name] for severe COPD, chronic respiratory failure on home oxygen, OSA on BiPAP, and NSCLC s/p RULectomy and adjuvant chemo in 2006.  PCCM consulted to assist with respiratory management, and to assist determination whether he is candidate for cardioversion.  He was recently treated as outpt for Lt ankle cellulitis.  SIGNIFICANT EVENTS: 6/12 Admit with A fib RVR, acute diastolic heart failure  STUDIES:  6/12 Echo >> mod LVH, EF 65 to 70%, mild/mod LA and RA dilation  LINES / TUBES: PIV  CULTURES: Blood 6/12 >> MRSA screen 6/12 >> POSITIVE  ANTIBIOTICS: Doxycycline 6/10 >> 6/12 Vancomycin 6/12 >> Zosyn 6/12 >>   HISTORY OF PRESENT ILLNESS:   He has been followed closely in pulmonary office.  He was tx for exacerbation in May with prednisone, and was doing better.  He developed cellulitis of Lt ankle, and was started on Abx as outpt.  He developed sudden onset of worsening dyspnea 6/12 associated with orthopnea.  He was found to have acute pulmonary edema and a fib with RVR.  He was started on broad spectrum Abx, BiPAP, and lasix.  His pulmonary status improved.  He is being evaluated by cardiology, and they are considering whether he is a candidate for cardioversion next week.  He has cough with clear sputum, but no worse than usual.  He denies sinus congestion or sore throat.  He denies chest pain at present.  He denies nausea.  His leg pain and swelling have improved.  PAST MEDICAL HISTORY :  Past Medical History  Diagnosis Date  . Stricture and  stenosis of esophagus 12/07/2004    EGD done on 12/07/2004  . GERD (gastroesophageal reflux disease)   . Hypoxemia     With chronic respiratory failure  . Increased prostate specific antigen (PSA) velocity 02/26/2011  . DM (diabetes mellitus) 02/26/2011  . Gout 02/26/2011  . COPD (chronic obstructive pulmonary disease)     oxygen at home, 4L  . OSA (obstructive sleep apnea) 02/26/2011    CPAP  . Paroxysmal atrial fibrillation   . Depression   . Cardiac arrest 2009    while in hospital   . Hypertension   . HTN (hypertension) 02/26/2011  . Cardiac arrest 2009    while in hospital  . Lung cancer     lung s/p lobectomy x2 on RT lung  . Basal cell carcinoma of nose 02/26/2011  . ETOH abuse 01/29/2012    started drinking again - hx cardiac arrest in 2009 due to dt's with organ failure  . CKD (chronic kidney disease) 02/28/2012  . Chronic diastolic CHF (congestive heart failure)     a. EF 55-60% by echo 2013.  . Sick sinus syndrome     a. s/p [employer] pacemaker 2010.  . Bradycardia     a. Admitted 2010 with severe bradycardia, runs of paroxysmal VT.   Marland Kitchen Paroxysmal VT     a. In 2010 in setting of marked bradycardia.  Marland Kitchen CAD (coronary artery disease)     a. Nonobstructive CAD by cath 2006.  Marland Kitchen  Esophageal ulcer     a. By EGD 10/2011.  Marland Kitchen Hiatal hernia     a. By EGD 10/2011.  . Valvular heart disease     a. Mild MR/mild AI/mild AS by echo 05/2011.   Past Surgical History  Procedure Laterality Date  . Lung lobectomy      x2 lobes on RT lung  . Knee arthroscopy      bilateral  . Pace maker    . Tonsillectomy    . Hernia repair      x2, esophageal  . Cardiac catheterization    . Esophagogastroduodenoscopy  10/25/2011    Procedure: ESOPHAGOGASTRODUODENOSCOPY (EGD);  Surgeon: Hart Carwin, MD;  Location: [facility];  Service: Endoscopy;  Laterality: N/A;   Prior to Admission medications   Medication Sig Start Date End Date Taking? Authorizing Provider  albuterol (PROVENTIL  HFA;VENTOLIN HFA) 108 (90 BASE) MCG/ACT inhaler Inhale 2 puffs into the lungs every 6 (six) hours as needed for wheezing. 02/27/12 02/26/13 Yes Corwin Levins, MD  allopurinol (ZYLOPRIM) 100 MG tablet Take 1 tablet (100 mg total) by mouth daily. 05/14/12  Yes Corwin Levins, MD  ALPRAZolam Prudy Feeler) 0.5 MG tablet Take 1 tablet (0.5 mg total) by mouth 3 (three) times daily as needed. For anxiety/sleeplessness. 04/28/12  Yes Corwin Levins, MD  aspirin 81 MG tablet Take 81 mg by mouth daily.     Yes Historical Provider, MD  atorvastatin (LIPITOR) 40 MG tablet Take 40 mg by mouth at bedtime. 02/27/12  Yes Corwin Levins, MD  budesonide-formoterol [facility]) 160-4.5 MCG/ACT inhaler Inhale 2 puffs into the lungs 2 (two) times daily. 02/27/12 02/26/13 Yes Corwin Levins, MD  citalopram (CELEXA) 10 MG tablet Take 1 tablet (10 mg total) by mouth daily. 06/17/12 06/17/13 Yes Corwin Levins, MD  colchicine 0.6 MG tablet Take 1 tablet (0.6 mg total) by mouth daily as needed. For gout flares 02/27/12  Yes Corwin Levins, MD  diltiazem [facility]) 240 MG 24 hr capsule Take 1 capsule (240 mg total) by mouth daily. 02/27/12  Yes Corwin Levins, MD  doxycycline (VIBRA-TABS) 100 MG tablet Take 1 tablet (100 mg total) by mouth 2 (two) times daily. 08/25/12  Yes Corwin Levins, MD  ezetimibe (ZETIA) 10 MG tablet Take 10 mg by mouth daily.   Yes Historical Provider, MD  fish oil-omega-3 fatty acids 1000 MG capsule Take 2 g by mouth daily.     Yes Historical Provider, MD  Fluticasone-Salmeterol (ADVAIR) 500-50 MCG/DOSE AEPB Inhale 1 puff into the lungs every 12 (twelve) hours.   Yes Historical Provider, MD  furosemide (LASIX) 40 MG tablet 80-120 mg 2 (two) times daily. 120 mg in the morning and 80 mg in the afternoon 08/04/12  Yes Coralyn Helling, MD  hydrALAZINE (APRESOLINE) 50 MG tablet Take 1 tablet (50 mg total) by mouth 2 (two) times daily. 07/13/12  Yes Corwin Levins, MD  HYDROcodone-acetaminophen (NORCO) 7.5-325 MG per tablet Take 1 tablet by mouth  every 6 (six) hours as needed for pain. 08/25/12  Yes Corwin Levins, MD  hydroxypropyl methylcellulose (ISOPTO TEARS) 2.5 % ophthalmic solution Place 2 drops into both eyes as needed. For dry/irritated eyes.   Yes Historical Provider, MD  magnesium oxide (DIASENSE MAGNESIUM) 400 (241.3 MG) MG tablet Take 1 tablet (400 mg total) by mouth daily. 02/27/12  Yes Corwin Levins, MD  metFORMIN (GLUCOPHAGE) 500 MG tablet Take 500 mg by mouth 2 (two) times daily with a meal.  Yes Historical Provider, MD  metoprolol succinate (TOPROL-XL) 50 MG 24 hr tablet Take 50 mg by mouth 2 (two) times daily. Take with or immediately following a meal.   Yes Historical Provider, MD  Multiple Vitamins-Minerals (EYE VITAMINS) TABS Take 1 tablet by mouth 2 (two) times daily.     Yes Historical Provider, MD  multivitamin [facility]) per tablet Take 1 tablet by mouth daily.     Yes Historical Provider, MD  omeprazole (PRILOSEC) 20 MG capsule Take 1 tablet by mouth once daily. PHARMACY-PLEASE D/C NEXIUM RX...TOO EXPENSIVE FOR PATIENT. 04/23/12  Yes Hart Carwin, MD  potassium chloride SA (K-DUR,KLOR-CON) 20 MEQ tablet Take 1 tablet (20 mEq total) by mouth daily. 02/27/12  Yes Corwin Levins, MD  predniSONE (DELTASONE) 5 MG tablet Take 5 mg by mouth daily. 08/04/12  Yes Coralyn Helling, MD  temazepam (RESTORIL) 30 MG capsule Take 1 capsule (30 mg total) by mouth at bedtime. 02/27/12 11/28/13 Yes Corwin Levins, MD  tiotropium (SPIRIVA) 18 MCG inhalation capsule Place 1 capsule (18 mcg total) into inhaler and inhale daily. 02/27/12 02/26/13 Yes Corwin Levins, MD  vitamin B-12 (CYANOCOBALAMIN) 100 MCG tablet Take 50 mcg by mouth daily.     Yes Historical Provider, MD  vitamin E 600 UNIT capsule Take 600 Units by mouth 2 (two) times daily.     Yes Historical Provider, MD  nitroGLYCERIN (NITROSTAT) 0.4 MG SL tablet Place 1 tablet (0.4 mg total) under the tongue every 5 (five) minutes as needed for chest pain. 08/14/12   Cassell Clement, MD   No  Known Allergies  FAMILY HISTORY:  Family History  Problem Relation Age of Onset  . Heart disease Father   . Lung cancer Mother   . Cancer Other     lung cancer  . Heart disease Other   . Hypertension Other    SOCIAL HISTORY:  reports that he quit smoking about 16 years ago. His smoking use included Cigarettes. He has a 75 pack-year smoking history. His smokeless tobacco use includes Chew. He reports that  drinks alcohol. He reports that he does not use illicit drugs.  REVIEW OF SYSTEMS:   Negative except above.  SUBJECTIVE:   VITAL SIGNS: Temp:  [97.9 F (36.6 C)-98.7 F (37.1 C)] 98 F (36.7 C) (06/13 0439) Pulse Rate:  [85-105] 92 (06/13 0439) Resp:  [20-24] 20 (06/13 0439) BP: (111-138)/(65-87) 138/73 mmHg (06/13 1024) SpO2:  [95 %-99 %] 98 % (06/13 1209) FiO2 (%):  [40 %] 40 % (06/13 0018) Weight:  [207 lb 3.7 oz (94 kg)] 207 lb 3.7 oz (94 kg) (06/13 0439) 4 liters [place]  PHYSICAL EXAMINATION: General:  No distress, speaks in full sentences, using accessory muscles Neuro:  Alert, follows commands, normal strength, CN intact HEENT:  No sinus tenderness, MP 3 airway, no LAN Cardiovascular:  Irregular, tachycardic, no murmur Lungs:  Decreased breath sounds, basilar rales, scattered b/l wheeze Abdomen:  Soft, non tender, + bowel sounds Musculoskeletal:  2+ leg edema Skin:  Small area of redness w/o tenderness at Lt lateral malleolus   Recent Labs Lab 08/25/12 1653 08/27/12 0349 08/28/12 0500  NA 139 138 137  K 4.1 3.7 3.1*  CL 102 98 97  CO2 29 28 27   BUN 33* 33* 42*  CREATININE 2.4* 2.10* 2.32*  GLUCOSE 172* 196* 153*    Recent Labs Lab 08/25/12 1653 08/27/12 0349 08/28/12 0500  HGB 12.6* 12.0* 11.0*  HCT 38.2* 36.1* 32.7*  WBC 14.6* 20.9* 21.3*  PLT 278.0 284 256   Dg Chest Portable 1 View  08/27/2012   *RADIOLOGY REPORT*  Clinical Data: Shortness of breath.  History of lung cancer post lobectomy.  PORTABLE CHEST - 1 VIEW  Comparison: 02/02/2012   Findings: Shallow inspiration.  Cardiac enlargement with pulmonary vascular congestion and perihilar edema.  Bilateral pleural effusions.  Atelectasis in the lung bases.  No pneumothorax. Changes have developed since the previous study.  Postoperative changes in the right chest.  Stable appearance of cardiac pacemaker and left central venous catheter.  Calcified and tortuous aorta.  IMPRESSION: Interval development of cardiac enlargement, pulmonary vascular congestion, perihilar edema, and bilateral pleural effusions.   Original Report Authenticated By: Burman Nieves, [name]   BNP (last 3 results)  Recent Labs  01/31/12 0330 08/27/12 0350 08/28/12 0500  PROBNP 1901.0* 3385.0* 6086.0*     ASSESSMENT / PLAN:  Acute on chronic respiratory failure 2nd to acute pulmonary edema from acute diastolic heart failure and new onset A fib with RVR.  This is setting of severe COPD and OSA.  Pneumonia seems less likely. Plan: -adjust oxygen with goal SpO2 > 90% -BiPAP qhs and prn during the day -negative fluid balance per cardiology and primary team -continue spiriva -change to brovana/pulmicort nebulizer with prn xopenex nebulizer >> d/c dulera while on this regimen -continue prednisone 5 mg daily -f/u CXR 6/14 -will re-assess next week whether he is candidate for cardioversion -continue heparin gtt per primary team  Lt ankle cellulitis. Plan: -Abx per primary team  Insomnia. Plan: -prn xanax, trazodone  Updated pt's wife at bedside.  D/w Dr. Graciela Husbands.  Coralyn Helling, MD [facility] Pulmonary/Critical Care 08/28/2012, 2:44 PM Pager:  [phone] After 3pm call: [phone]

## 2012-08-28 NOTE — Progress Notes (Signed)
ANTICOAGULATION CONSULT NOTE - Follow-Up Consult  Pharmacy Consult for Heparin Indication: atrial fibrillation  No Known Allergies  Patient Measurements: Height: 5\' 7"  (170.2 cm) Weight: 206 lb 12.7 oz (93.8 kg) IBW/kg (Calculated) : 66.1 Heparin Dosing Weight: 86kg  Vital Signs: Temp: 97.9 F (36.6 C) (06/13 0018) Temp src: Axillary (06/13 0018) BP: 138/86 mmHg (06/13 0018) Pulse Rate: 85 (06/13 0018)  Labs:  Recent Labs  08/25/12 1653 08/27/12 0349  08/27/12 0830 08/27/12 1405 08/27/12 1438 08/27/12 1958 08/27/12 2330  HGB 12.6* 12.0*  --   --   --   --   --   --   HCT 38.2* 36.1*  --   --   --   --   --   --   PLT 278.0 284  --   --   --   --   --   --   APTT  --   --   --   --   --  33  --   --   LABPROT  --   --   --   --   --  13.5  --   --   INR  --   --   --   --   --  1.04  --   --   HEPARINUNFRC  --   --   --   --   --   --   --  0.62  CREATININE 2.4* 2.10*  --   --   --   --   --   --   TROPONINI  --   --   < > 0.81* 1.36*  --  1.71*  --   < > = values in this interval not displayed.  Estimated Creatinine Clearance: 32.2 ml/min (by C-G formula based on Cr of 2.1).  Assessment: 79yom on heparin for Afib. Heparin level 0.62 (therapeutic) on 1250 units/hr. No bleeding noted.  Goal of Therapy:  Heparin level 0.3-0.7 units/ml Monitor platelets by anticoagulation protocol: Yes   Plan:  1. Continue Heparin drip at 1250 units/hr 2. Daily heparin level and CBC  Christoper Fabian, PharmD, BCPS Clinical pharmacist, pager [phone] 08/28/2012,12:55 AM

## 2012-08-28 NOTE — Progress Notes (Signed)
Rt Note: Placed pt on CPAP 12cmH2o with FFM per home use with 4L 02 bleed in. Pt tolerating well at this time Rt will continue to monitor

## 2012-08-28 NOTE — Progress Notes (Signed)
Patient: [name] GOLSON Sr. Date of Encounter: 08/28/2012, 11:48 AM Admit date: 08/27/2012     Subjective  [name] Stevenson reports his SOB is "about the same." He denies CP. Wife at bedside.   Objective  Physical Exam: Vitals: BP 138/73  Pulse 92  Temp(Src) 98 F (36.7 C) (Oral)  Resp 20  Ht 5\' 7"  (1.702 m)  Wt 207 lb 3.7 oz (94 kg)  BMI 32.45 kg/m2  SpO2 95% General: Well developed, chronically ill appearing 79 year old male in no acute distress. Neck: Supple. JVD not elevated. Lungs: Diminished breath sounds throughout but clear bilaterally to auscultation without wheezes, rales, or rhonchi. Breathing is unlabored. Heart: Irregular S1 S2 without murmurs, rubs, or gallops.  Abdomen: Soft, non-distended. Extremities: No clubbing or cyanosis. No edema.  Distal pedal pulses are 2+ and equal bilaterally. Multiple ecchymoses forearms bilaterally and skin tear left forearm. Neuro: Alert and oriented X 3. Moves all extremities spontaneously. No focal deficits.  Intake/Output:  Intake/Output Summary (Last 24 hours) at 08/28/12 1148 Last data filed at 08/28/12 0600  Gross per 24 hour  Intake  877.5 ml  Output   1625 ml  Net -747.5 ml    Inpatient Medications:  . albuterol  2.5 mg Nebulization QID  . aspirin EC  81 mg Oral Daily  . atorvastatin  40 mg Oral QHS  . Chlorhexidine Gluconate Cloth  6 each Topical Q0600  . citalopram  10 mg Oral Daily  . diltiazem  240 mg Oral Daily  . ezetimibe  10 mg Oral Daily  . guaiFENesin  1,200 mg Oral BID  . hydrALAZINE  50 mg Oral BID  . insulin aspart  0-9 Units Subcutaneous TID WC  . magnesium oxide  400 mg Oral Daily  . metoprolol succinate  50 mg Oral BID WC  . mometasone-formoterol  2 puff Inhalation BID  . mupirocin ointment  1 application Nasal BID  . pantoprazole  40 mg Oral Daily  . piperacillin-tazobactam (ZOSYN)  IV  3.375 g Intravenous Q8H  . predniSONE  5 mg Oral Daily  . sodium chloride  3 mL Intravenous Q12H  .  tiotropium  18 mcg Inhalation Daily  . vancomycin  1,250 mg Intravenous Q24H   . furosemide (LASIX) infusion 10 mg/hr (08/27/12 1416)  . heparin 1,250 Units/hr (08/27/12 1624)    Labs:  Recent Labs  08/27/12 0349 08/28/12 0500  NA 138 137  K 3.7 3.1*  CL 98 97  CO2 28 27  GLUCOSE 196* 153*  BUN 33* 42*  CREATININE 2.10* 2.32*  CALCIUM 8.8 8.3*  MG  --  1.9    Recent Labs  08/25/12 1653  AST 23  ALT 25  ALKPHOS 76  BILITOT 0.8  PROT 6.2  ALBUMIN 3.1*    Recent Labs  08/25/12 1653 08/27/12 0349 08/28/12 0500  WBC 14.6* 20.9* 21.3*  NEUTROABS 12.6* 17.0*  --   HGB 12.6* 12.0* 11.0*  HCT 38.2* 36.1* 32.7*  MCV 96.4 93.5 92.1  PLT 278.0 284 256    Recent Labs  08/27/12 0350 08/27/12 0830 08/27/12 1405 08/27/12 1958  TROPONINI <0.30 0.81* 1.36* 1.71*    Recent Labs  08/27/12 0830  HGBA1C 6.2*    Recent Labs  08/25/12 1653  CHOL 156  HDL 53.60  LDLCALC 84  TRIG 94.0  CHOLHDL 3    Recent Labs  08/27/12 0830  TSH 0.925    Recent Labs  08/27/12 1438  INR 1.04  Radiology/Studies: Dg Chest Portable 1 View 08/27/2012   Findings: Shallow inspiration.  Cardiac enlargement with pulmonary vascular congestion and perihilar edema.  Bilateral pleural effusions.  Atelectasis in the lung bases.  No pneumothorax. Changes have developed since the previous study.  Postoperative changes in the right chest.  Stable appearance of cardiac pacemaker and left central venous catheter.  Calcified and tortuous aorta.  IMPRESSION: Interval development of cardiac enlargement, pulmonary vascular congestion, perihilar edema, and bilateral pleural effusions.   Original Report Authenticated By: Burman Nieves, [name]    Echocardiogram: Study Conclusions - Left ventricle: The cavity size was normal. Wall thickness was increased in a pattern of moderate LVH. Systolic function was vigorous. The estimated ejection fraction was in the range of 65% to 70%. Wall  motion was normal; there were no regional wall motion abnormalities. - Left atrium: The atrium was mildly to moderately dilated. 60 mm. - Right atrium: The atrium was mildly to moderately dilated.   Telemetry: AF in the 90s; PVCs    Assessment and Plan  1. Acute on chronic respiratory failure 2. Acute on chronic diastolic CHF 3. Elevated troponin with h/o nonobstructive CAD - likely due to demand ischemia  4. Paroxysmal atrial fibrillation - has been persistent since June 27, 2012 by device interrogation; [name] Stevenson and his wife report increased SOB x 1 month which correlates to onset of AF which has been persistent; LA size 60 mm; currently on IV heparin; ? attempt to restore SR but will need to address long term anticoagulation 5. Hypertension - improved this AM 6. Hypokalemia - being repleted by primary team 7. Leukocytosis, seems more elevated than expected with chronic steroid use; per primary medical team 8. COPD on home O2 and chronic prednisone 9. CKD  Dr. Graciela Husbands to see Signed, EDMISTEN, BROO[place] PA-C  Still sob  Will begin NOAC and anticpate DCCV on MON if ok with pulm With LA enlargement may not hold sinus but worth try Will need augmented rate control, although som e of this may be secondary to the acute respiratory illness Will ask pulm to see

## 2012-08-29 ENCOUNTER — Inpatient Hospital Stay (HOSPITAL_COMMUNITY): Payer: Medicare Other

## 2012-08-29 DIAGNOSIS — R042 Hemoptysis: Secondary | ICD-10-CM | POA: Diagnosis present

## 2012-08-29 DIAGNOSIS — I5033 Acute on chronic diastolic (congestive) heart failure: Secondary | ICD-10-CM

## 2012-08-29 LAB — PROCALCITONIN: Procalcitonin: 2.8 ng/mL

## 2012-08-29 LAB — CBC
HCT: 33.8 % — ABNORMAL LOW (ref 39.0–52.0)
MCH: 31.4 pg (ref 26.0–34.0)
MCV: 92.3 fL (ref 78.0–100.0)
RBC: 3.66 MIL/uL — ABNORMAL LOW (ref 4.22–5.81)
RDW: 16.1 % — ABNORMAL HIGH (ref 11.5–15.5)
WBC: 19.3 10*3/uL — ABNORMAL HIGH (ref 4.0–10.5)

## 2012-08-29 LAB — GLUCOSE, CAPILLARY
Glucose-Capillary: 127 mg/dL — ABNORMAL HIGH (ref 70–99)
Glucose-Capillary: 188 mg/dL — ABNORMAL HIGH (ref 70–99)

## 2012-08-29 LAB — URINE CULTURE
Colony Count: NO GROWTH
Culture: NO GROWTH

## 2012-08-29 LAB — BASIC METABOLIC PANEL
BUN: 47 mg/dL — ABNORMAL HIGH (ref 6–23)
CO2: 27 mEq/L (ref 19–32)
Calcium: 8.5 mg/dL (ref 8.4–10.5)
Chloride: 95 mEq/L — ABNORMAL LOW (ref 96–112)
Creatinine, Ser: 2.44 mg/dL — ABNORMAL HIGH (ref 0.50–1.35)

## 2012-08-29 MED ORDER — INSULIN ASPART 100 UNIT/ML ~~LOC~~ SOLN
0.0000 [IU] | Freq: Three times a day (TID) | SUBCUTANEOUS | Status: DC
  Administered 2012-08-30: 2 [IU] via SUBCUTANEOUS
  Administered 2012-08-30: 3 [IU] via SUBCUTANEOUS
  Administered 2012-08-30 – 2012-08-31 (×3): 2 [IU] via SUBCUTANEOUS
  Administered 2012-08-31: 3 [IU] via SUBCUTANEOUS
  Administered 2012-09-01: 2 [IU] via SUBCUTANEOUS
  Administered 2012-09-01: 3 [IU] via SUBCUTANEOUS
  Administered 2012-09-01: 7 [IU] via SUBCUTANEOUS
  Administered 2012-09-02: 5 [IU] via SUBCUTANEOUS
  Administered 2012-09-02: 2 [IU] via SUBCUTANEOUS
  Administered 2012-09-02: 5 [IU] via SUBCUTANEOUS
  Administered 2012-09-03 (×2): 2 [IU] via SUBCUTANEOUS
  Administered 2012-09-03: 3 [IU] via SUBCUTANEOUS
  Administered 2012-09-04: 2 [IU] via SUBCUTANEOUS
  Administered 2012-09-04: 3 [IU] via SUBCUTANEOUS
  Administered 2012-09-04 – 2012-09-05 (×2): 1 [IU] via SUBCUTANEOUS
  Administered 2012-09-05: 7 [IU] via SUBCUTANEOUS
  Administered 2012-09-06: 1 [IU] via SUBCUTANEOUS
  Administered 2012-09-06: 9 [IU] via SUBCUTANEOUS
  Administered 2012-09-07: 1 [IU] via SUBCUTANEOUS
  Administered 2012-09-07: 2 [IU] via SUBCUTANEOUS
  Administered 2012-09-07: 3 [IU] via SUBCUTANEOUS
  Administered 2012-09-08: 5 [IU] via SUBCUTANEOUS
  Administered 2012-09-08 – 2012-09-09 (×3): 1 [IU] via SUBCUTANEOUS
  Administered 2012-09-09 – 2012-09-10 (×2): 5 [IU] via SUBCUTANEOUS
  Administered 2012-09-10 – 2012-09-12 (×2): 2 [IU] via SUBCUTANEOUS
  Administered 2012-09-12 – 2012-09-13 (×2): 1 [IU] via SUBCUTANEOUS
  Administered 2012-09-13 (×2): 2 [IU] via SUBCUTANEOUS
  Administered 2012-09-14: 1 [IU] via SUBCUTANEOUS
  Administered 2012-09-14: 2 [IU] via SUBCUTANEOUS
  Administered 2012-09-14: 5 [IU] via SUBCUTANEOUS
  Administered 2012-09-15 (×2): 2 [IU] via SUBCUTANEOUS
  Administered 2012-09-15 – 2012-09-16 (×2): 1 [IU] via SUBCUTANEOUS

## 2012-08-29 MED ORDER — LEVOFLOXACIN 500 MG PO TABS
500.0000 mg | ORAL_TABLET | ORAL | Status: DC
  Administered 2012-08-29 – 2012-09-02 (×3): 500 mg via ORAL
  Filled 2012-08-29 (×6): qty 1

## 2012-08-29 MED ORDER — METOPROLOL SUCCINATE ER 50 MG PO TB24
50.0000 mg | ORAL_TABLET | Freq: Two times a day (BID) | ORAL | Status: DC
  Administered 2012-08-30 – 2012-08-31 (×3): 50 mg via ORAL
  Filled 2012-08-29 (×5): qty 1

## 2012-08-29 MED ORDER — SODIUM CHLORIDE 0.9 % IJ SOLN
10.0000 mL | INTRAMUSCULAR | Status: DC | PRN
  Administered 2012-08-29 – 2012-09-05 (×4): 10 mL
  Administered 2012-09-08: 40 mL
  Administered 2012-09-08 – 2012-09-16 (×7): 10 mL

## 2012-08-29 MED ORDER — RIVAROXABAN 15 MG PO TABS
15.0000 mg | ORAL_TABLET | Freq: Every day | ORAL | Status: DC
  Filled 2012-08-29: qty 1

## 2012-08-29 MED ORDER — HYDRALAZINE HCL 50 MG PO TABS: 50.0000 mg | ORAL_TABLET | Freq: Three times a day (TID) | ORAL | Status: DC

## 2012-08-29 MED ORDER — HYDRALAZINE HCL 50 MG PO TABS
50.0000 mg | ORAL_TABLET | Freq: Two times a day (BID) | ORAL | Status: DC
  Administered 2012-08-29 – 2012-09-14 (×32): 50 mg via ORAL
  Filled 2012-08-29 (×35): qty 1

## 2012-08-29 MED ORDER — METHYLPREDNISOLONE SODIUM SUCC 40 MG IJ SOLR
40.0000 mg | Freq: Two times a day (BID) | INTRAMUSCULAR | Status: DC
  Administered 2012-08-29 – 2012-09-01 (×7): 40 mg via INTRAVENOUS
  Filled 2012-08-29 (×10): qty 1

## 2012-08-29 NOTE — Progress Notes (Signed)
[place], Rapid response RN states that patient appropriate for med-surg floor since on Lasix drip. [place], Rapid response RN stated to monitor VS q4h. Will continue to monitor patient. Nelda Marseille, RN

## 2012-08-29 NOTE — Consult Note (Signed)
PULMONARY  / CRITICAL CARE MEDICINE  Name: [name] VERONICA Sr. MRN: [id number] DOB: 04-Nov-1932    ADMISSION DATE:  08/27/2012 CONSULTATION DATE:  08/28/2012  REFERRING MD :  Berton Mount PRIMARY SERVICE:  [facility]  CHIEF COMPLAINT:  Short of breath  BRIEF PATIENT DESCRIPTION:  79 yo male former smoker presented with severe dyspnea from acute pulmonary edema, acute diastolic dysfunction, and new onset A fib with RVR.  He is followed by Dr. [name] for severe COPD, chronic respiratory failure on home oxygen, OSA on BiPAP, and NSCLC s/p RULectomy and adjuvant chemo in 2006.  PCCM consulted to assist with respiratory management, and to assist determination whether he is candidate for cardioversion.  He was recently treated as outpt for Lt ankle cellulitis.  SIGNIFICANT EVENTS: 6/12> Admit with A fib RVR, acute diastolic heart failure 6/14> Hemoptysis on Xarelto=> stopped, placed on Levaquin/ Solumedrol.  STUDIES:  6/12 Echo >> mod LVH, EF 65 to 70%, mild/mod LA and RA dilation  LINES / TUBES: PIV  CULTURES: Blood 6/12 >> MRSA screen 6/12 >> POSITIVE  ANTIBIOTICS: Doxycycline 6/10 >> 6/12 Vancomycin 6/12 >> Zosyn 6/12 >>   HISTORY OF PRESENT ILLNESS:   He has been followed closely in pulmonary office.  He was tx for exacerbation in May with prednisone, and was doing better.  He developed cellulitis of Lt ankle, and was started on Abx as outpt.  He developed sudden onset of worsening dyspnea 6/12 associated with orthopnea.  He was found to have acute pulmonary edema and a fib with RVR.  He was started on broad spectrum Abx, BiPAP, and lasix.  His pulmonary status improved.  He is being evaluated by cardiology, and they are considering whether he is a candidate for cardioversion next week.  He has cough with clear sputum, but no worse than usual.  He denies sinus congestion or sore throat.  He denies chest pain at present.  He denies nausea.  His leg pain and swelling have improved.  PAST  MEDICAL HISTORY :  Past Medical History  Diagnosis Date  . Stricture and stenosis of esophagus 12/07/2004    EGD done on 12/07/2004  . GERD (gastroesophageal reflux disease)   . Hypoxemia     With chronic respiratory failure  . Increased prostate specific antigen (PSA) velocity 02/26/2011  . DM (diabetes mellitus) 02/26/2011  . Gout 02/26/2011  . COPD (chronic obstructive pulmonary disease)     oxygen at home, 4L  . OSA (obstructive sleep apnea) 02/26/2011    CPAP  . Paroxysmal atrial fibrillation   . Depression   . Cardiac arrest 2009    while in hospital   . Hypertension   . HTN (hypertension) 02/26/2011  . Cardiac arrest 2009    while in hospital  . Lung cancer     lung s/p lobectomy x2 on RT lung  . Basal cell carcinoma of nose 02/26/2011  . ETOH abuse 01/29/2012    started drinking again - hx cardiac arrest in 2009 due to dt's with organ failure  . CKD (chronic kidney disease) 02/28/2012  . Chronic diastolic CHF (congestive heart failure)     a. EF 55-60% by echo 2013.  . Sick sinus syndrome     a. s/p [employer] pacemaker 2010.  . Bradycardia     a. Admitted 2010 with severe bradycardia, runs of paroxysmal VT.   Marland Kitchen Paroxysmal VT     a. In 2010 in setting of marked bradycardia.  Marland Kitchen CAD (coronary artery disease)  a. Nonobstructive CAD by cath 2006.  Marland Kitchen Esophageal ulcer     a. By EGD 10/2011.  Marland Kitchen Hiatal hernia     a. By EGD 10/2011.  . Valvular heart disease     a. Mild MR/mild AI/mild AS by echo 05/2011.   Past Surgical History  Procedure Laterality Date  . Lung lobectomy      x2 lobes on RT lung  . Knee arthroscopy      bilateral  . Pace maker    . Tonsillectomy    . Hernia repair      x2, esophageal  . Cardiac catheterization    . Esophagogastroduodenoscopy  10/25/2011    Procedure: ESOPHAGOGASTRODUODENOSCOPY (EGD);  Surgeon: Hart Carwin, MD;  Location: [facility];  Service: Endoscopy;  Laterality: N/A;   Inpatient Medications:   . arformoterol  15 mcg  Nebulization BID  . atorvastatin  40 mg Oral QHS  . budesonide (PULMICORT) nebulizer solution  0.5 mg Nebulization BID  . Chlorhexidine Gluconate Cloth  6 each Topical Q0600  . citalopram  10 mg Oral Daily  . diltiazem  180 mg Oral BID  . ezetimibe  10 mg Oral Daily  . guaiFENesin  1,200 mg Oral BID  . hydrALAZINE  50 mg Oral BID  . insulin aspart  0-9 Units Subcutaneous TID WC  . magnesium oxide  400 mg Oral Daily  . metoprolol succinate  50 mg Oral BID WC  . mupirocin ointment  1 application Nasal BID  . pantoprazole  40 mg Oral Daily  . predniSONE  5 mg Oral Daily  . sodium chloride  3 mL Intravenous Q12H  . tiotropium  18 mcg Inhalation Daily    No Known Allergies   VITAL SIGNS: Temp:  [98.4 F (36.9 C)-99 F (37.2 C)] 98.4 F (36.9 C) (06/14 0630) Pulse Rate:  [70-113] 92 (06/14 0630) Resp:  [19-30] 24 (06/14 0630) BP: (99-146)/(68-102) 138/87 mmHg (06/14 0630) SpO2:  [90 %-98 %] 90 % (06/14 0630) FiO2 (%):  [36 %] 36 % (06/13 2058) Weight:  [207 lb 7.3 oz (94.1 kg)] 207 lb 7.3 oz (94.1 kg) (06/14 0630) 4 liters Frank  PHYSICAL EXAMINATION: General:  No distress, speaks in full sentences, using accessory muscles Neuro:  Alert, follows commands, normal strength, CN intact HEENT:  No sinus tenderness, MP 3 airway, no LAN Cardiovascular:  Irregular, tachycardic, no murmur Lungs:  Decreased breath sounds, basilar rales, scattered b/l wheeze Abdomen:  Soft, non tender, + bowel sounds Musculoskeletal:  2+ leg edema Skin:  Small area of redness w/o tenderness at Lt lateral malleolus   Recent Labs Lab 08/27/12 0349 08/28/12 0500 08/29/12 0400  NA 138 137 137  K 3.7 3.1* 3.6  CL 98 97 95*  CO2 28 27 27   BUN 33* 42* 47*  CREATININE 2.10* 2.32* 2.44*  GLUCOSE 196* 153* 146*    Recent Labs Lab 08/27/12 0349 08/28/12 0500 08/29/12 0400  HGB 12.0* 11.0* 11.5*  HCT 36.1* 32.7* 33.8*  WBC 20.9* 21.3* 19.3*  PLT 284 256 297   Dg Chest Port 1 View  08/29/2012    *RADIOLOGY REPORT*  Clinical Data: Follow-up CHF  PORTABLE CHEST - 1 VIEW  Comparison: Prior chest x-ray 08/27/2012  Findings: Stable position of left subclavian central venous catheter with the tip in the mid superior vena cava.  Right subclavian approach cardiac rhythm maintenance device with leads in the right atrium and right ventricle.  Slightly improved inspiratory volumes with clearing in the right base.  Stable elevation of the left hemidiaphragm resulting in blunting of the left costophrenic angle.  Decreasing interstitial edema with the residual asymmetric patchy opacity in the right mid and lower lobe. Small residual right pleural effusion.  Stable cardiomegaly. Aortic atherosclerosis again noted.  No pneumothorax.  IMPRESSION:  1.  Decreasing pulmonary edema with improved inspiratory volumes and clearing in the right base. 2.  Residual asymmetric patchy opacity in the right mid and lower lung may reflect residual asymmetric alveolar edema, or pneumonia. 3.  Persistent small right pleural effusion, chronic elevation of the left hemidiaphragm and cardiomegaly.   Original Report Authenticated By: Malachy Moan, [name]     BNP (last 3 results)  Recent Labs  01/31/12 0330 08/27/12 0350 08/28/12 0500  PROBNP 1901.0* 3385.0* 6086.0*     ASSESSMENT / PLAN:  Hemoptysis in setting of COPD exac, Anticoag w/ Hep=> changed to Xarelto, new AFib... Plan: -Anticoag stopped for now (DrKlein DC'd Xarelto this AM)... -he was on zosyn/ Vanco transiently, then DC'd -continue COPD Rx as below... -add Levaquin po -change po Pred5mg  to IV Solumedrol 40mg Q12h -f/u CXR & labs   Acute on chronic respiratory failure 2nd to acute pulmonary edema from acute diastolic heart failure and new onset A fib with RVR.  This is setting of severe COPD and OSA.  Pneumonia seems less likely. Plan: -adjust oxygen with goal SpO2 > 90% -BiPAP qhs and prn during the day -negative fluid balance per cardiology and  primary team -continue spiriva daily -continue GFN 1200mg  Bid -on brovana/pulmicort nebulizer BID with prn xopenex nebulizer >> d/c dulera while on this regimen -will re-assess next week whether he is candidate for cardioversion   Lt ankle cellulitis. Plan: -Abx per primary team   Renal Insuffic:  Cr=2.44 Plan: -per primary team   Insomnia. Plan: -prn xanax, trazodone    Desmond Szabo M. Kriste Basque, MD [facility] Pulmonary/Critical Care 08/29/2012, 9:06 AM

## 2012-08-29 NOTE — Progress Notes (Signed)
Notified Claiborne Billings, NP that patient is coughing up blood. Pt coughing up dime sided bloody clots about 5 clots. No new orders given. Will continue to monitor patient. Nelda Marseille, RN

## 2012-08-29 NOTE — Progress Notes (Signed)
Patient: [name] Stevenson. Date of Encounter: 08/29/2012, 8:45 AM Admit date: 08/27/2012     Subjective  Admitted with respiratory failure and CHF with severe O2 dependent COPD; hx of lung Ca In seeting of normal LV systolic function  Nonobstructive CAD and found to have AFib with RVR Medical therapy for rate control and diuresis;  Also chronic RF   Breathing about the same, but significant hemoptysis this am--seen prev with CA presentation; last CT was  11/13   Objective  Physical Exam: Vitals: BP 138/87  Pulse 92  Temp(Src) 98.4 F (36.9 C) (Oral)  Resp 24  Ht 5\' 7"  (1.702 m)  Wt 207 lb 7.3 oz (94.1 kg)  BMI 32.48 kg/m2  SpO2 90% General: Well developed, chronically ill appearing 79 year old male in no acute distress. Neck: Supple. JVD not elevated. Lungs: Diminished breath sounds throughout but clear bilaterally to auscultation without wheezes, rales, or rhonchi. Breathing is unlabored. Heart: Irregular S1 S2 without murmurs, rubs, or gallops.  Abdomen: Soft, non-distended. Extremities: No clubbing or cyanosis. 2+ asymmetric  edema.  Distal pedal pulses are 2+ and equal bilaterally. Multiple ecchymoses forearms bilaterally and skin tear left forearm. Neuro: Alert and oriented X 3. Moves all extremities spontaneously. No focal deficits.  Intake/Output:  Intake/Output Summary (Last 24 hours) at 08/29/12 0845 Last data filed at 08/29/12 0639  Gross per 24 hour  Intake 1092.63 ml  Output   2045 ml  Net -952.37 ml    Inpatient Medications:  . arformoterol  15 mcg Nebulization BID  . atorvastatin  40 mg Oral QHS  . budesonide (PULMICORT) nebulizer solution  0.5 mg Nebulization BID  . Chlorhexidine Gluconate Cloth  6 each Topical Q0600  . citalopram  10 mg Oral Daily  . diltiazem  180 mg Oral BID  . ezetimibe  10 mg Oral Daily  . guaiFENesin  1,200 mg Oral BID  . hydrALAZINE  50 mg Oral BID  . insulin aspart  0-9 Units Subcutaneous TID WC  . magnesium oxide  400 mg  Oral Daily  . metoprolol succinate  50 mg Oral BID WC  . mupirocin ointment  1 application Nasal BID  . pantoprazole  40 mg Oral Daily  . potassium chloride  40 mEq Oral BID  . predniSONE  5 mg Oral Daily  . rivaroxaban  15 mg Oral QAC supper  . sodium chloride  3 mL Intravenous Q12H  . tiotropium  18 mcg Inhalation Daily      Labs:  Recent Labs  08/28/12 0500 08/29/12 0400  NA 137 137  K 3.1* 3.6  CL 97 95*  CO2 27 27  GLUCOSE 153* 146*  BUN 42* 47*  CREATININE 2.32* 2.44*  CALCIUM 8.3* 8.5  MG 1.9 2.0   No results found for this basename: AST, ALT, ALKPHOS, BILITOT, PROT, ALBUMIN,  in the last 72 hours  Recent Labs  08/27/12 0349 08/28/12 0500 08/29/12 0400  WBC 20.9* 21.3* 19.3*  NEUTROABS 17.0*  --   --   HGB 12.0* 11.0* 11.5*  HCT 36.1* 32.7* 33.8*  MCV 93.5 92.1 92.3  PLT 284 256 297    Recent Labs  08/27/12 0350 08/27/12 0830 08/27/12 1405 08/27/12 1958  TROPONINI <0.30 0.81* 1.36* 1.71*    Recent Labs  08/27/12 0830  HGBA1C 6.2*   No results found for this basename: CHOL, HDL, LDLCALC, TRIG, CHOLHDL,  in the last 72 hours  Recent Labs  08/27/12 0830  TSH 0.925  Recent Labs  08/27/12 1438  INR 1.04    Radiology/Studies: Dg Chest Portable 1 View 08/27/2012   Findings: Shallow inspiration.  Cardiac enlargement with pulmonary vascular congestion and perihilar edema.  Bilateral pleural effusions.  Atelectasis in the lung bases.  No pneumothorax. Changes have developed since the previous study.  Postoperative changes in the right chest.  Stable appearance of cardiac pacemaker and left central venous catheter.  Calcified and tortuous aorta.  IMPRESSION: Interval development of cardiac enlargement, pulmonary vascular congestion, perihilar edema, and bilateral pleural effusions.   Original Report Authenticated By: Burman Nieves, [name]    Echocardiogram: Study Conclusions - Left ventricle: The cavity size was normal. Wall thickness was  increased in a pattern of moderate LVH. Systolic function was vigorous. The estimated ejection fraction was in the range of 65% to 70%. Wall motion was normal; there were no regional wall motion abnormalities. - Left atrium: The atrium was mildly to moderately dilated. 60 mm. - Right atrium: The atrium was mildly to moderately dilated.   Telemetry: AF in the 90s; PVCs    Assessment and Plan  1. Acute on chronic respiratory failure 2. Acute on chronic diastolic CHF 3. Elevated troponin with h/o nonobstructive CAD - likely due to demand ischemia  4. Paroxysmal atrial fibrillation - has been persistent since June 27, 2012 by device interrogation; Mr. Mangiaracina and his wife report increased SOB x 1 month which correlates to onset of AF which has been persistent; LA size 60 mm;   5. Hypertension - improved this AM 6. COPD on home O2 and chronic prednisone:  Lung Ca 9. CKD-worsening 7. Hemoptysis  Dr. Graciela Husbands to see Signed, Sherryl Manges PA-C  Still sob  AFib HR control better will hold steady  On current meds for now  Increasing Bun Cr  Suspect over diuresis and will need to back off on lasix; will d/c today  Restoration if possible of sinus will be our best bet for relieving HFpEF However hemoptysis interrupts the plan, will ask pulm to reevaluate and offer thoughts as to use of anticoagulation so as to be able to address AFib   HTN will hold on current meds so as to maintain renal perfusion   Still anticpate DCCV on MON  If ok with pulm  With LA enlargement may not hold sinus but worth try

## 2012-08-29 NOTE — Progress Notes (Signed)
[facility] Progress Note [place] TEAM 1 - Stepdown/ICU TEAM   Beverely Pace Sandquist Sr. ZOX:[id number] DOB: 06/13/1932 DOA: 08/27/2012 PCP: [name], MD  HPI/Subjective: He feels much better, sitting at bedside, on 4 L of oxygen per nasal cannula. Seems to be significant hemoptysis  Brief narrative: 79 y.o. male with past medical history of chronic respiratory failure on 4 L of oxygen at home secondary to COPD, OSA, CHF, A. fib and CKD. Patient came into the hospital because of shortness of breath. He reported having shortness of breath for 4-5 days, associated with some cough, and subjective fevers. He was recently started on doxycycline for presumed cellulitis of left heel. The shortness of breath progressively worsened prompting him to seek medical care because of severe SOB. He was placed on BiPAP. In the ED his chest x-ray was negative for pneumonia but theres were small bilateral effusions and vascular congestion consistent with CHF. Labs revealed significant leukocytosis of 21,000.   Assessment/Plan:  Acute-on-chronic respiratory failure with hypoxia due to:   CHF (congestive heart failure)/? diastolic dysfunction   CHRONIC OBSTRUCTIVE PULMONARY DISEASE, SEVERE -has been weaned back to home rate of oxygen, BiPAP discontinued. -has diuresed 1700 cc since admit -Cards managing HF and Lasix infusion -could have atypical pulmonary infection, but doubt - will d/c abx and follow clinically  -cont supportive care with nebs/MDI and home dose Prednisone -Levaquin added, Zosyn vancomycin was discontinued yesterday. -Although stepdown staff think patient does not have COPD I believe he does. -Patient has leukocytosis, minimal layering tray and elevated procalcitonin consistent with infection.  Hemoptysis -Patient was on heparin drip, in setting of mild pulmonary edema and COPD exacerbation. -Anticoagulation was discontinued. -Cardiology is considering DCCV, pulmonary or helping as  well.  CKD (chronic kidney disease), stage III -baseline is 28/1.8 -Renal function worsened, suspect overdiuresis. Lasix discontinued by Dr. Graciela Husbands. -Check BMP in a.m.    Leukocytosis -Etiology unclear so will continue anbx's, likely from his COPD exacerbation -Both legs reddened but exam without pain which would be expected if had severe cellulitis given very high WBC. -Improved since yesterday.  CORONARY ARTERY DISEASE (nonobstructive disease 2006 catheterization)/Elevation of cardiac enzymes -no CP and likely due to acute HF, demand ischemia from recent hypoxia and worsened renal function  HTN (hypertension) -BP controlled -cont home meds  DM (diabetes mellitus) type II controlled with renal manifestation -controlled  -cont SSI  OSA (obstructive sleep apnea)  Atrial fibrillation with controlled ventricular rates - recent onset -cont BB and CCB  ? Cellulitis of left ankle -has redness both legs c/w stasis dermatitis  Hypokalemia Due to lasix - replace and follow   NSCLC s/p RULectomy and adjuvant chemo in 2006  DVT prophylaxis: On full dose heparin infusion Code Status: Full Family Communication: Patient and wife at bedside Disposition Plan: Remains inpatient Isolation: Contact isolation for MRSA positive PCR  Consultants: Cardiology PCCM (per Cards)  Procedures: 2-D echocardiogram - Left ventricle: The cavity size was normal. Wall thickness was increased in a pattern of moderate LVH. Systolic function was vigorous. The estimated ejection fraction was in the range of 65% to 70%. Wall motion was normal; there were no regional wall motion abnormalities. - Left atrium: The atrium was mildly to moderately dilated. - Right atrium: The atrium was mildly to moderately dilated.   Antibiotics: Levaquin 6/11 Zosyn 6/12 >>>6/13 Vancomycin 6/12 >>>6/13   Objective: Blood pressure 147/93, pulse 89, temperature 98.4 F (36.9 C), temperature source Oral, resp. rate  24, height 5\' 7"  (1.702  m), weight 94.1 kg (207 lb 7.3 oz), SpO2 90.00%.  Intake/Output Summary (Last 24 hours) at 08/29/12 1149 Last data filed at 08/29/12 0102  Gross per 24 hour  Intake 262.13 ml  Output   1245 ml  Net -982.87 ml     Exam: General: No acute respiratory distress at rest  Lungs: Clear to auscultation bilaterally without wheezes or crackles,  4L Cardiovascular: Regular rate without murmur gallop or rub normal S1 and S2, 2-3+ bilateral lower extremity peripheral edema  Abdomen: Nontender, nondistended, soft, bowel sounds positive, no rebound, no ascites, no appreciable mass Musculoskeletal: No significant cyanosis, clubbing of bilateral lower extremities Neurological: Alert and oriented x 3, moves all extremities x 4 without focal neurological deficits, CN 2-12 intact  Scheduled Meds: Scheduled Meds: . arformoterol  15 mcg Nebulization BID  . atorvastatin  40 mg Oral QHS  . budesonide (PULMICORT) nebulizer solution  0.5 mg Nebulization BID  . Chlorhexidine Gluconate Cloth  6 each Topical Q0600  . citalopram  10 mg Oral Daily  . diltiazem  180 mg Oral BID  . ezetimibe  10 mg Oral Daily  . guaiFENesin  1,200 mg Oral BID  . hydrALAZINE  50 mg Oral BID  . insulin aspart  0-9 Units Subcutaneous TID WC  . levofloxacin  500 mg Oral Q48H  . magnesium oxide  400 mg Oral Daily  . methylPREDNISolone (SOLU-MEDROL) injection  40 mg Intravenous Q12H  . metoprolol succinate  50 mg Oral BID WC  . mupirocin ointment  1 application Nasal BID  . pantoprazole  40 mg Oral Daily  . sodium chloride  3 mL Intravenous Q12H  . tiotropium  18 mcg Inhalation Daily   Continuous Infusions:    Data Reviewed: Basic Metabolic Panel:  Recent Labs Lab 08/25/12 1653 08/27/12 0349 08/28/12 0500 08/29/12 0400  NA 139 138 137 137  K 4.1 3.7 3.1* 3.6  CL 102 98 97 95*  CO2 29 28 27 27   GLUCOSE 172* 196* 153* 146*  BUN 33* 33* 42* 47*  CREATININE 2.4* 2.10* 2.32* 2.44*  CALCIUM 8.9  8.8 8.3* 8.5  MG  --   --  1.9 2.0   Liver Function Tests:  Recent Labs Lab 08/25/12 1653  AST 23  ALT 25  ALKPHOS 76  BILITOT 0.8  PROT 6.2  ALBUMIN 3.1*   CBC:  Recent Labs Lab 08/25/12 1653 08/27/12 0349 08/28/12 0500 08/29/12 0400  WBC 14.6* 20.9* 21.3* 19.3*  NEUTROABS 12.6* 17.0*  --   --   HGB 12.6* 12.0* 11.0* 11.5*  HCT 38.2* 36.1* 32.7* 33.8*  MCV 96.4 93.5 92.1 92.3  PLT 278.0 284 256 297   Cardiac Enzymes:  Recent Labs Lab 08/27/12 0350 08/27/12 0830 08/27/12 1405 08/27/12 1958  TROPONINI <0.30 0.81* 1.36* 1.71*   BNP (last 3 results)  Recent Labs  01/31/12 0330 08/27/12 0350 08/28/12 0500  PROBNP 1901.0* 3385.0* 6086.0*   CBG:  Recent Labs Lab 08/28/12 0802 08/28/12 1221 08/28/12 1805 08/29/12 0059 08/29/12 0756  GLUCAP 143* 139* 154* 127* 122*    Recent Results (from the past 240 hour(s))  CULTURE, BLOOD (ROUTINE X 2)     Status: None   Collection Time    08/27/12  8:30 AM      Result Value Range Status   Specimen Description BLOOD LEFT HAND   Final   Special Requests BOTTLES DRAWN AEROBIC ONLY 5CC   Final   Culture  Setup Time 08/27/2012 14:16   Final  Culture     Final   Value:        BLOOD CULTURE RECEIVED NO GROWTH TO DATE CULTURE WILL BE HELD FOR 5 DAYS BEFORE ISSUING A FINAL NEGATIVE REPORT   Report Status PENDING   Incomplete  MRSA PCR SCREENING     Status: Abnormal   Collection Time    08/27/12  8:38 AM      Result Value Range Status   MRSA by PCR POSITIVE (*) NEGATIVE Final   Comment:            The GeneXpert MRSA Assay (FDA     approved for NASAL specimens     only), is one component of a     comprehensive MRSA colonization     surveillance program. It is not     intended to diagnose MRSA     infection nor to guide or     monitor treatment for     MRSA infections.     RESULT CALLED TO, READ BACK BY AND VERIFIED WITH:     Esperanza Richters AT 1148 08/27/12 BY K BARR  CULTURE, BLOOD (ROUTINE X 2)     Status: None    Collection Time    08/27/12  8:40 AM      Result Value Range Status   Specimen Description BLOOD LEFT HAND   Final   Special Requests BOTTLES DRAWN AEROBIC ONLY 4CC   Final   Culture  Setup Time 08/27/2012 14:16   Final   Culture     Final   Value:        BLOOD CULTURE RECEIVED NO GROWTH TO DATE CULTURE WILL BE HELD FOR 5 DAYS BEFORE ISSUING A FINAL NEGATIVE REPORT   Report Status PENDING   Incomplete  URINE CULTURE     Status: None   Collection Time    08/27/12  8:28 PM      Result Value Range Status   Specimen Description URINE, CLEAN CATCH   Final   Special Requests NONE   Final   Culture  Setup Time 08/28/2012 09:51   Final   Colony Count NO GROWTH   Final   Culture NO GROWTH   Final   Report Status 08/29/2012 FINAL   Final     Studies:  Recent x-ray studies have been reviewed in detail by the Attending Physician  Scheduled Meds:  Reviewed in detail by the Attending Physician   [facility] Office  [phone] Pager [phone]  **If unable to reach the above provider after paging please contact the Flow Manager @ [phone]  On-Call/Text Page:      [email or web address]      password [email or web address]  If 7PM-7AM, please contact night-coverage [email or web address] Password [facility] 08/29/2012, 11:49 AM   LOS: 2 days

## 2012-08-30 ENCOUNTER — Inpatient Hospital Stay (HOSPITAL_COMMUNITY): Payer: Medicare Other

## 2012-08-30 DIAGNOSIS — C3490 Malignant neoplasm of unspecified part of unspecified bronchus or lung: Secondary | ICD-10-CM

## 2012-08-30 LAB — GLUCOSE, CAPILLARY
Glucose-Capillary: 161 mg/dL — ABNORMAL HIGH (ref 70–99)
Glucose-Capillary: 218 mg/dL — ABNORMAL HIGH (ref 70–99)

## 2012-08-30 LAB — URINALYSIS, ROUTINE W REFLEX MICROSCOPIC
Bilirubin Urine: NEGATIVE
Hgb urine dipstick: NEGATIVE
Ketones, ur: NEGATIVE mg/dL
Specific Gravity, Urine: 1.01 (ref 1.005–1.030)
Urobilinogen, UA: 0.2 mg/dL (ref 0.0–1.0)
pH: 6.5 (ref 5.0–8.0)

## 2012-08-30 LAB — BASIC METABOLIC PANEL
BUN: 52 mg/dL — ABNORMAL HIGH (ref 6–23)
CO2: 28 mEq/L (ref 19–32)
GFR calc non Af Amer: 27 mL/min — ABNORMAL LOW (ref >90)
Glucose, Bld: 155 mg/dL — ABNORMAL HIGH (ref 70–99)
Potassium: 3.5 mEq/L (ref 3.5–5.1)
Sodium: 138 mEq/L (ref 135–145)

## 2012-08-30 LAB — URINE MICROSCOPIC-ADD ON

## 2012-08-30 MED ORDER — FUROSEMIDE 10 MG/ML IJ SOLN
40.0000 mg | Freq: Once | INTRAMUSCULAR | Status: AC
  Administered 2012-08-30: 40 mg via INTRAVENOUS
  Filled 2012-08-30: qty 4

## 2012-08-30 MED ORDER — FUROSEMIDE 10 MG/ML IJ SOLN
80.0000 mg | Freq: Three times a day (TID) | INTRAMUSCULAR | Status: DC
  Administered 2012-08-30 – 2012-08-31 (×3): 80 mg via INTRAVENOUS
  Filled 2012-08-30 (×6): qty 8

## 2012-08-30 NOTE — Progress Notes (Signed)
PULMONARY  / CRITICAL CARE MEDICINE  Name: [name] YARBRO Sr. MRN: [id number] DOB: [birth year]    ADMISSION DATE:  08/27/2012 CONSULTATION DATE:  08/28/2012  REFERRING MD :  Berton Mount PRIMARY SERVICE:  [facility]  CHIEF COMPLAINT:  Short of breath  BRIEF PATIENT DESCRIPTION:  79 yo male former smoker presented with severe dyspnea from acute pulmonary edema, acute diastolic dysfunction, and new onset A fib with RVR.  He is followed by Dr. [name] for severe COPD, chronic respiratory failure on home oxygen, OSA on BiPAP, and NSCLC s/p RULectomy and adjuvant chemo in 2006.  PCCM consulted to assist with respiratory management, and to assist determination whether he is candidate for cardioversion.  He was recently treated as outpt for Lt ankle cellulitis.  SIGNIFICANT EVENTS: 6/12> Admit with A fib RVR, acute diastolic heart failure 6/14> Hemoptysis on Xarelto=> stopped, placed on Levaquin/ Solumedrol. 6/15> wife reports similar lumps of bloody phlegm thru the night; seems diminished this AM, persist rhonchi/congestion.  STUDIES:  6/12 Echo >> mod LVH, EF 65 to 70%, mild/mod LA and RA dilation  LINES / TUBES: PIV  CULTURES: Blood 6/12 >> MRSA screen 6/12 >> POSITIVE  ANTIBIOTICS: Doxycycline 6/10 >> 6/12 Vancomycin 6/12 >> Zosyn 6/12 >>   HISTORY OF PRESENT ILLNESS:   He has been followed closely in pulmonary office.  He was tx for exacerbation in May with prednisone, and was doing better.  He developed cellulitis of Lt ankle, and was started on Abx as outpt.  He developed sudden onset of worsening dyspnea 6/12 associated with orthopnea.  He was found to have acute pulmonary edema and a fib with RVR.  He was started on broad spectrum Abx, BiPAP, and lasix.  His pulmonary status improved.  He is being evaluated by cardiology, and they are considering whether he is a candidate for cardioversion next week.  He has cough with clear sputum, but no worse than usual.  He denies sinus congestion or  sore throat.  He denies chest pain at present.  He denies nausea.  His leg pain and swelling have improved.  6/14> developed hemoptysis on Xarelto.  PAST MEDICAL HISTORY :  Past Medical History  Diagnosis Date  . Stricture and stenosis of esophagus 12/07/2004    EGD done on 12/07/2004  . GERD (gastroesophageal reflux disease)   . Hypoxemia     With chronic respiratory failure  . Increased prostate specific antigen (PSA) velocity 02/26/2011  . DM (diabetes mellitus) 02/26/2011  . Gout 02/26/2011  . COPD (chronic obstructive pulmonary disease)     oxygen at home, 4L  . OSA (obstructive sleep apnea) 02/26/2011    CPAP  . Paroxysmal atrial fibrillation   . Depression   . Cardiac arrest 2009    while in hospital   . Hypertension   . HTN (hypertension) 02/26/2011  . Cardiac arrest 2009    while in hospital  . Lung cancer     lung s/p lobectomy x2 on RT lung  . Basal cell carcinoma of nose 02/26/2011  . ETOH abuse 01/29/2012    started drinking again - hx cardiac arrest in 2009 due to dt's with organ failure  . CKD (chronic kidney disease) 02/28/2012  . Chronic diastolic CHF (congestive heart failure)     a. EF 55-60% by echo 2013.  . Sick sinus syndrome     a. s/p [employer] pacemaker 2010.  . Bradycardia     a. Admitted 2010 with severe bradycardia, runs of paroxysmal VT.   Marland Kitchen  Paroxysmal VT     a. In 2010 in setting of marked bradycardia.  Marland Kitchen CAD (coronary artery disease)     a. Nonobstructive CAD by cath 2006.  Marland Kitchen Esophageal ulcer     a. By EGD 10/2011.  Marland Kitchen Hiatal hernia     a. By EGD 10/2011.  . Valvular heart disease     a. Mild MR/mild AI/mild AS by echo 05/2011.   Past Surgical History  Procedure Laterality Date  . Lung lobectomy      x2 lobes on RT lung  . Knee arthroscopy      bilateral  . Pace maker    . Tonsillectomy    . Hernia repair      x2, esophageal  . Cardiac catheterization    . Esophagogastroduodenoscopy  10/25/2011    Procedure:  ESOPHAGOGASTRODUODENOSCOPY (EGD);  Surgeon: Hart Carwin, MD;  Location: [facility];  Service: Endoscopy;  Laterality: N/A;   Inpatient Medications:   . arformoterol  15 mcg Nebulization BID  . atorvastatin  40 mg Oral QHS  . budesonide (PULMICORT) nebulizer solution  0.5 mg Nebulization BID  . Chlorhexidine Gluconate Cloth  6 each Topical Q0600  . citalopram  10 mg Oral Daily  . diltiazem  180 mg Oral BID  . ezetimibe  10 mg Oral Daily  . guaiFENesin  1,200 mg Oral BID  . hydrALAZINE  50 mg Oral BID  . insulin aspart  0-9 Units Subcutaneous TID WC  . levofloxacin  500 mg Oral Q48H  . magnesium oxide  400 mg Oral Daily  . methylPREDNISolone (SOLU-MEDROL) injection  40 mg Intravenous Q12H  . metoprolol succinate  50 mg Oral BID WC  . mupirocin ointment  1 application Nasal BID  . pantoprazole  40 mg Oral Daily  . sodium chloride  3 mL Intravenous Q12H  . tiotropium  18 mcg Inhalation Daily    No Known Allergies   VITAL SIGNS: Temp:  [98 F (36.7 C)-98.8 F (37.1 C)] 98 F (36.7 C) (06/15 0510) Pulse Rate:  [82-97] 82 (06/15 0846) Resp:  [20-22] 22 (06/15 0510) BP: (135-173)/(89-106) 173/93 mmHg (06/15 0846) SpO2:  [90 %-95 %] 90 % (06/15 0510) 4 liters [place]  PHYSICAL EXAMINATION: General:  No distress, speaks in full sentences, using accessory muscles Neuro:  Alert, follows commands, normal strength, CN intact HEENT:  No sinus tenderness, MP 3 airway, no LAN Cardiovascular:  Irregular, tachycardic, no murmur Lungs:  Decreased breath sounds, basilar rales, scattered b/l rhonchi & ewheezing Abdomen:  Soft, non tender, + bowel sounds Musculoskeletal:  2+ leg edema Skin:  Small area of redness w/o tenderness at Lt lateral malleolus   Recent Labs Lab 08/28/12 0500 08/29/12 0400 08/30/12 0450  NA 137 137 138  K 3.1* 3.6 3.5  CL 97 95* 96  CO2 27 27 28   BUN 42* 47* 52*  CREATININE 2.32* 2.44* 2.21*  GLUCOSE 153* 146* 155*    Recent Labs Lab 08/27/12 0349  08/28/12 0500 08/29/12 0400  HGB 12.0* 11.0* 11.5*  HCT 36.1* 32.7* 33.8*  WBC 20.9* 21.3* 19.3*  PLT 284 256 297   Dg Chest Port 1 View  08/30/2012   *RADIOLOGY REPORT*  Clinical Data: Respiratory failure.  PORTABLE CHEST - 1 VIEW  Comparison: 08/29/2012 and 08/27/2012  Findings: Prominent interstitial markings could represent underlying edema.  There are persistent interstitial and airspace densities in the right mid and lower lung region.  Heart size remains enlarged.  Again noted is a right dual  lead cardiac pacemaker.  Left subclavian Port-A-Cath in the SVC region.  Aortic arch is heavily calcified.  IMPRESSION:  Prominent interstitial markings may represent underlying edema. Again noted are increased densities in the right mid and lower lung region which could represent asymmetric edema but cannot exclude an infectious etiology.   Original Report Authenticated By: Richarda Overlie, [name]   Dg Chest Port 1 View  08/29/2012   *RADIOLOGY REPORT*  Clinical Data: Follow-up CHF  PORTABLE CHEST - 1 VIEW  Comparison: Prior chest x-ray 08/27/2012  Findings: Stable position of left subclavian central venous catheter with the tip in the mid superior vena cava.  Right subclavian approach cardiac rhythm maintenance device with leads in the right atrium and right ventricle.  Slightly improved inspiratory volumes with clearing in the right base.  Stable elevation of the left hemidiaphragm resulting in blunting of the left costophrenic angle.  Decreasing interstitial edema with the residual asymmetric patchy opacity in the right mid and lower lobe. Small residual right pleural effusion.  Stable cardiomegaly. Aortic atherosclerosis again noted.  No pneumothorax.  IMPRESSION:  1.  Decreasing pulmonary edema with improved inspiratory volumes and clearing in the right base. 2.  Residual asymmetric patchy opacity in the right mid and lower lung may reflect residual asymmetric alveolar edema, or pneumonia. 3.  Persistent small  right pleural effusion, chronic elevation of the left hemidiaphragm and cardiomegaly.   Original Report Authenticated By: Malachy Moan, [name]           FILM from 08/30/13 >>   BNP (last 3 results)  Recent Labs  01/31/12 0330 08/27/12 0350 08/28/12 0500  PROBNP 1901.0* 3385.0* 6086.0*     ASSESSMENT / PLAN:  Hemoptysis in setting of COPD exac, Anticoag w/ Hep=> changed to Xarelto, new AFib... Plan: -Anticoag (Xarelto) stopped for now -he was on Zosyn/ Vanco transiently, then DC'd -continue COPD Rx as below... -added Levaquin po=> continue -changed po Pred5mg  to IV Solumedrol 40mg Q12h=> continue -f/u CXR & labs   Acute on chronic respiratory failure 2nd to acute pulmonary edema from acute diastolic heart failure and new onset A fib with RVR.  This is setting of severe COPD and OSA.  Pneumonia seems less likely. Plan: -adjust oxygen with goal SpO2 > 90% -BiPAP qhs and prn during the day -negative fluid balance per cardiology and primary team -continue spiriva daily -continue GFN 1200mg  Bid -on brovana/pulmicort nebulizer BID with prn xopenex nebulizer >> d/c dulera while on this regimen -give Lasix40 6/15> watch I&O, renal, BNP, CXR   Lt ankle cellulitis. Plan: -Abx per primary team   Renal Insuffic:  Cr=2.44 Plan: -per primary team   Insomnia. Plan: -prn xanax, trazodone    Kmya Placide M. Kriste Basque, MD [facility] Pulmonary/Critical Care 08/30/2012, 9:27 AM

## 2012-08-30 NOTE — Progress Notes (Signed)
Patient: [name] KALAS Sr. Date of Encounter: 08/30/2012, 7:46 AM Admit date: 08/27/2012     Subjective  Admitted with respiratory failure and CHF with severe O2 dependent COPD; hx of lung Ca In seeting of normal LV systolic function  Nonobstructive CAD and found to have AFib with RVR Medical therapy for rate control and diuresis;  Also chronic Renal Failure       Objective  Physical Exam: Vitals: BP 135/92  Pulse 88  Temp(Src) 98 F (36.7 C) (Oral)  Resp 22  Ht 5\' 7"  (1.702 m)  Wt 207 lb 7.3 oz (94.1 kg)  BMI 32.48 kg/m2  SpO2 90%  .  Intake/Output:  Intake/Output Summary (Last 24 hours) at 08/30/12 0746 Last data filed at 08/30/12 4098  Gross per 24 hour  Intake    240 ml  Output   1000 ml  Net   -760 ml    Inpatient Medications:  . arformoterol  15 mcg Nebulization BID  . atorvastatin  40 mg Oral QHS  . budesonide (PULMICORT) nebulizer solution  0.5 mg Nebulization BID  . Chlorhexidine Gluconate Cloth  6 each Topical Q0600  . citalopram  10 mg Oral Daily  . diltiazem  180 mg Oral BID  . ezetimibe  10 mg Oral Daily  . guaiFENesin  1,200 mg Oral BID  . hydrALAZINE  50 mg Oral BID  . insulin aspart  0-9 Units Subcutaneous TID WC  . levofloxacin  500 mg Oral Q48H  . magnesium oxide  400 mg Oral Daily  . methylPREDNISolone (SOLU-MEDROL) injection  40 mg Intravenous Q12H  . metoprolol succinate  50 mg Oral BID WC  . mupirocin ointment  1 application Nasal BID  . pantoprazole  40 mg Oral Daily  . sodium chloride  3 mL Intravenous Q12H  . tiotropium  18 mcg Inhalation Daily      Labs:  Recent Labs  08/28/12 0500 08/29/12 0400 08/30/12 0450  NA 137 137 138  K 3.1* 3.6 3.5  CL 97 95* 96  CO2 27 27 28   GLUCOSE 153* 146* 155*  BUN 42* 47* 52*  CREATININE 2.32* 2.44* 2.21*  CALCIUM 8.3* 8.5 8.6  MG 1.9 2.0  --    No results found for this basename: AST, ALT, ALKPHOS, BILITOT, PROT, ALBUMIN,  in the last 72 hours  Recent Labs  08/28/12 0500  08/29/12 0400  WBC 21.3* 19.3*  HGB 11.0* 11.5*  HCT 32.7* 33.8*  MCV 92.1 92.3  PLT 256 297    Recent Labs  08/27/12 0830 08/27/12 1405 08/27/12 1958  TROPONINI 0.81* 1.36* 1.71*    Recent Labs  08/27/12 0830  HGBA1C 6.2*   No results found for this basename: CHOL, HDL, LDLCALC, TRIG, CHOLHDL,  in the last 72 hours  Recent Labs  08/27/12 0830  TSH 0.925    Recent Labs  08/27/12 1438  INR 1.04    Radiology/Studies: Dg Chest Portable 1 View 08/27/2012   Findings: Shallow inspiration.  Cardiac enlargement with pulmonary vascular congestion and perihilar edema.  Bilateral pleural effusions.  Atelectasis in the lung bases.  No pneumothorax. Changes have developed since the previous study.  Postoperative changes in the right chest.  Stable appearance of cardiac pacemaker and left central venous catheter.  Calcified and tortuous aorta.  IMPRESSION: Interval development of cardiac enlargement, pulmonary vascular congestion, perihilar edema, and bilateral pleural effusions.   Original Report Authenticated By: Burman Nieves, [name]    Echocardiogram: Study Conclusions - Left  ventricle: The cavity size was normal. Wall thickness was increased in a pattern of moderate LVH. Systolic function was vigorous. The estimated ejection fraction was in the range of 65% to 70%. Wall motion was normal; there were no regional wall motion abnormalities. - Left atrium: The atrium was mildly to moderately dilated. 60 mm. - Right atrium: The atrium was mildly to moderately dilated.   Telemetry: AF in the 90s; PVCs    Assessment and Plan  1. Acute on chronic respiratory failure 2. Acute on chronic diastolic CHF 3. Elevated troponin with h/o nonobstructive CAD - likely due to demand ischemia  4. Paroxysmal atrial fibrillation - has been persistent since June 27, 2012 by device interrogation; [name] Stevenson and his wife report increased SOB x 1 month which correlates to onset of AF which has  been persistent; LA size 60 mm;   5. Hypertension - improved this AM 6. COPD on home O2 and chronic prednisone:  Lung Ca 9. CKD-worsening 7. Hemoptysis  Dr. Graciela Husbands to see Signed, Sherryl Manges PA-C  Still sob  AFib HR control better will hold steady  On current meds for now   Increasing Bun Cr stabilizing. Suspect over diuresis and prednisone effect  will continue to hold lasix;  Restoration if possible of sinus will be our best bet for relieving HFpEF, but right now, that plan is on hold 2/2 hemoptysis.  We really are stuck as to how we may be able to make him better if we cant cardiovert him  Await pulm input as to when they think safe to start anticoagulation again  HTN will hold on current meds so as to maintain renal perfusion

## 2012-08-30 NOTE — Progress Notes (Signed)
[facility] Progress Note   [name] QUINCY Sr. LKG:[id number] DOB: 06/21/1932 DOA: 08/27/2012 PCP: [name], MD  HPI/Subjective: Having shortness of breath this morning, family very concerned. Continues to have hemoptysis and his breathing seems to be worse today. I will restart his Lasix.  Brief narrative: 79 y.o. male with past medical history of chronic respiratory failure on 4 L of oxygen at home secondary to COPD, OSA, CHF, A. fib and CKD. Patient came into the hospital because of shortness of breath. He reported having shortness of breath for 4-5 days, associated with some cough, and subjective fevers. He was recently started on doxycycline for presumed cellulitis of left heel. The shortness of breath progressively worsened prompting him to seek medical care because of severe SOB. He was placed on BiPAP. In the ED his chest x-ray was negative for pneumonia but theres were small bilateral effusions and vascular congestion consistent with CHF. Labs revealed significant leukocytosis of 21,000.   Assessment/Plan:  Acute-on-chronic respiratory failure with hypoxia due to:   CHF (congestive heart failure)/? diastolic dysfunction   CHRONIC OBSTRUCTIVE PULMONARY DISEASE, SEVERE -has been weaned back to home rate of oxygen, BiPAP discontinued. -has diuresed 1700 cc since admit -Cards managing HF and Lasix infusion -could have atypical pulmonary infection, but doubt - will d/c abx and follow clinically  -cont supportive care with nebs/MDI and home dose Prednisone -Levaquin added, Zosyn vancomycin was discontinued yesterday. -Patient has leukocytosis, minimal layering tray and elevated procalcitonin consistent with infection.  Hemoptysis -Patient was on heparin drip, in setting of mild pulmonary edema and COPD exacerbation. -Anticoagulation was discontinued. Check CT scan of the chest today without contrast. -Cardiology is considering DCCV, patient has to be on heparin and this is  planned, pulmonary please advise.  CKD (chronic kidney disease), stage III -baseline is 28/1.8 in March of 2014 -Renal function worsened, overdiuresis with suspected and Lasix was stopped. -Patient has significant lower extremity edema, chest x-ray shows pulmonary edema as well -Check BMP in a.m.    Leukocytosis -Etiology unclear so will continue anbx's, likely from his COPD exacerbation -Both legs reddened but exam without pain which would be expected if had severe cellulitis given very high WBC. -Improved since yesterday.  CORONARY ARTERY DISEASE (nonobstructive disease 2006 catheterization)/Elevation of cardiac enzymes -no CP and likely due to acute HF, demand ischemia from recent hypoxia and worsened renal function  HTN (hypertension) -BP controlled -cont home meds  DM (diabetes mellitus) type II controlled with renal manifestation -controlled  -cont SSI  OSA (obstructive sleep apnea)  Atrial fibrillation with controlled ventricular rates - recent onset -cont BB and CCB  ? Cellulitis of left ankle -has redness both legs c/w stasis dermatitis  Hypokalemia Due to lasix - replace and follow   NSCLC s/p RULectomy and adjuvant chemo in 2006  DVT prophylaxis: On full dose heparin infusion Code Status: Full Family Communication: Patient and wife at bedside Disposition Plan: Remains inpatient Isolation: Contact isolation for MRSA positive PCR  Consultants: Cardiology PCCM (per Cards)  Procedures: 2-D echocardiogram - Left ventricle: The cavity size was normal. Wall thickness was increased in a pattern of moderate LVH. Systolic function was vigorous. The estimated ejection fraction was in the range of 65% to 70%. Wall motion was normal; there were no regional wall motion abnormalities. - Left atrium: The atrium was mildly to moderately dilated. - Right atrium: The atrium was mildly to moderately dilated.   Antibiotics: Levaquin 6/11 Zosyn 6/12  >>>6/13 Vancomycin 6/12 >>>6/13   Objective:  Blood pressure 173/93, pulse 82, temperature 98 F (36.7 C), temperature source Oral, resp. rate 22, height 5\' 7"  (1.702 m), weight 94.1 kg (207 lb 7.3 oz), SpO2 92.00%.  Intake/Output Summary (Last 24 hours) at 08/30/12 1112 Last data filed at 08/30/12 0900  Gross per 24 hour  Intake    480 ml  Output    700 ml  Net   -220 ml     Exam: General: No acute respiratory distress at rest  Lungs: Clear to auscultation bilaterally without wheezes or crackles,  4L Cardiovascular: Regular rate without murmur gallop or rub normal S1 and S2, 2-3+ bilateral lower extremity peripheral edema  Abdomen: Nontender, nondistended, soft, bowel sounds positive, no rebound, no ascites, no appreciable mass Musculoskeletal: No significant cyanosis, clubbing of bilateral lower extremities Neurological: Alert and oriented x 3, moves all extremities x 4 without focal neurological deficits, CN 2-12 intact  Scheduled Meds: Scheduled Meds: . arformoterol  15 mcg Nebulization BID  . atorvastatin  40 mg Oral QHS  . budesonide (PULMICORT) nebulizer solution  0.5 mg Nebulization BID  . Chlorhexidine Gluconate Cloth  6 each Topical Q0600  . citalopram  10 mg Oral Daily  . diltiazem  180 mg Oral BID  . ezetimibe  10 mg Oral Daily  . guaiFENesin  1,200 mg Oral BID  . hydrALAZINE  50 mg Oral BID  . insulin aspart  0-9 Units Subcutaneous TID WC  . levofloxacin  500 mg Oral Q48H  . magnesium oxide  400 mg Oral Daily  . methylPREDNISolone (SOLU-MEDROL) injection  40 mg Intravenous Q12H  . metoprolol succinate  50 mg Oral BID WC  . mupirocin ointment  1 application Nasal BID  . pantoprazole  40 mg Oral Daily  . sodium chloride  3 mL Intravenous Q12H  . tiotropium  18 mcg Inhalation Daily   Continuous Infusions:    Data Reviewed: Basic Metabolic Panel:  Recent Labs Lab 08/25/12 1653 08/27/12 0349 08/28/12 0500 08/29/12 0400 08/30/12 0450  NA 139 138 137  137 138  K 4.1 3.7 3.1* 3.6 3.5  CL 102 98 97 95* 96  CO2 29 28 27 27 28   GLUCOSE 172* 196* 153* 146* 155*  BUN 33* 33* 42* 47* 52*  CREATININE 2.4* 2.10* 2.32* 2.44* 2.21*  CALCIUM 8.9 8.8 8.3* 8.5 8.6  MG  --   --  1.9 2.0  --    Liver Function Tests:  Recent Labs Lab 08/25/12 1653  AST 23  ALT 25  ALKPHOS 76  BILITOT 0.8  PROT 6.2  ALBUMIN 3.1*   CBC:  Recent Labs Lab 08/25/12 1653 08/27/12 0349 08/28/12 0500 08/29/12 0400  WBC 14.6* 20.9* 21.3* 19.3*  NEUTROABS 12.6* 17.0*  --   --   HGB 12.6* 12.0* 11.0* 11.5*  HCT 38.2* 36.1* 32.7* 33.8*  MCV 96.4 93.5 92.1 92.3  PLT 278.0 284 256 297   Cardiac Enzymes:  Recent Labs Lab 08/27/12 0350 08/27/12 0830 08/27/12 1405 08/27/12 1958  TROPONINI <0.30 0.81* 1.36* 1.71*   BNP (last 3 results)  Recent Labs  01/31/12 0330 08/27/12 0350 08/28/12 0500  PROBNP 1901.0* 3385.0* 6086.0*   CBG:  Recent Labs Lab 08/29/12 0756 08/29/12 1200 08/29/12 1729 08/29/12 2220 08/30/12 0721  GLUCAP 122* 98 187* 188* 161*    Recent Results (from the past 240 hour(s))  CULTURE, BLOOD (ROUTINE X 2)     Status: None   Collection Time    08/27/12  8:30 AM  Result Value Range Status   Specimen Description BLOOD LEFT HAND   Final   Special Requests BOTTLES DRAWN AEROBIC ONLY 5CC   Final   Culture  Setup Time 08/27/2012 14:16   Final   Culture     Final   Value:        BLOOD CULTURE RECEIVED NO GROWTH TO DATE CULTURE WILL BE HELD FOR 5 DAYS BEFORE ISSUING A FINAL NEGATIVE REPORT   Report Status PENDING   Incomplete  MRSA PCR SCREENING     Status: Abnormal   Collection Time    08/27/12  8:38 AM      Result Value Range Status   MRSA by PCR POSITIVE (*) NEGATIVE Final   Comment:            The GeneXpert MRSA Assay (FDA     approved for NASAL specimens     only), is one component of a     comprehensive MRSA colonization     surveillance program. It is not     intended to diagnose MRSA     infection nor to  guide or     monitor treatment for     MRSA infections.     RESULT CALLED TO, READ BACK BY AND VERIFIED WITH:     Esperanza Richters AT 1148 08/27/12 BY K BARR  CULTURE, BLOOD (ROUTINE X 2)     Status: None   Collection Time    08/27/12  8:40 AM      Result Value Range Status   Specimen Description BLOOD LEFT HAND   Final   Special Requests BOTTLES DRAWN AEROBIC ONLY 4CC   Final   Culture  Setup Time 08/27/2012 14:16   Final   Culture     Final   Value:        BLOOD CULTURE RECEIVED NO GROWTH TO DATE CULTURE WILL BE HELD FOR 5 DAYS BEFORE ISSUING A FINAL NEGATIVE REPORT   Report Status PENDING   Incomplete  URINE CULTURE     Status: None   Collection Time    08/27/12  8:28 PM      Result Value Range Status   Specimen Description URINE, CLEAN CATCH   Final   Special Requests NONE   Final   Culture  Setup Time 08/28/2012 09:51   Final   Colony Count NO GROWTH   Final   Culture NO GROWTH   Final   Report Status 08/29/2012 FINAL   Final     Studies:  Recent x-ray studies have been reviewed in detail by the Attending Physician  Scheduled Meds:  Reviewed in detail by the Attending Physician   [facility] Office  [phone] Pager [phone]  **If unable to reach the above provider after paging please contact the Flow Manager @ [phone]  On-Call/Text Page:      [email or web address]      password [email or web address]  If 7PM-7AM, please contact night-coverage [email or web address] Password [email or web address] 08/30/2012, 11:12 AM   LOS: 3 days

## 2012-08-31 ENCOUNTER — Inpatient Hospital Stay (HOSPITAL_COMMUNITY): Payer: Medicare Other

## 2012-08-31 ENCOUNTER — Encounter (HOSPITAL_COMMUNITY)
Admission: EM | Disposition: A | Discharge status: Home Health | Payer: Self-pay | Source: Home / Self Care | Attending: Internal Medicine

## 2012-08-31 LAB — COMPREHENSIVE METABOLIC PANEL
ALT: 22 U/L (ref 0–53)
AST: 20 U/L (ref 0–37)
CO2: 30 mEq/L (ref 19–32)
Chloride: 96 mEq/L (ref 96–112)
GFR calc non Af Amer: 26 mL/min — ABNORMAL LOW (ref >90)
Sodium: 138 mEq/L (ref 135–145)
Total Bilirubin: 0.4 mg/dL (ref 0.3–1.2)

## 2012-08-31 LAB — CBC WITH DIFFERENTIAL/PLATELET
Basophils Absolute: 0 10*3/uL (ref 0.0–0.1)
Basophils Relative: 0 % (ref 0–1)
Eosinophils Absolute: 0 10*3/uL (ref 0.0–0.7)
MCH: 30.7 pg (ref 26.0–34.0)
MCHC: 33.3 g/dL (ref 30.0–36.0)
Neutrophils Relative %: 90 % — ABNORMAL HIGH (ref 43–77)
Platelets: 347 10*3/uL (ref 150–400)
RDW: 15.8 % — ABNORMAL HIGH (ref 11.5–15.5)

## 2012-08-31 LAB — EXPECTORATED SPUTUM ASSESSMENT W GRAM STAIN, RFLX TO RESP C

## 2012-08-31 LAB — GLUCOSE, CAPILLARY

## 2012-08-31 LAB — PRO B NATRIURETIC PEPTIDE: Pro B Natriuretic peptide (BNP): 7566 pg/mL — ABNORMAL HIGH (ref 0–450)

## 2012-08-31 SURGERY — ECHOCARDIOGRAM, TRANSESOPHAGEAL
Anesthesia: Moderate Sedation

## 2012-08-31 MED ORDER — METHYLPREDNISOLONE SODIUM SUCC 125 MG IJ SOLR
INTRAMUSCULAR | Status: AC
  Filled 2012-08-31: qty 2

## 2012-08-31 MED ORDER — FUROSEMIDE 10 MG/ML IJ SOLN
120.0000 mg | Freq: Two times a day (BID) | INTRAVENOUS | Status: DC
  Administered 2012-08-31 – 2012-09-10 (×19): 120 mg via INTRAVENOUS
  Filled 2012-08-31 (×21): qty 12

## 2012-08-31 MED ORDER — METOPROLOL SUCCINATE ER 25 MG PO TB24
25.0000 mg | ORAL_TABLET | Freq: Two times a day (BID) | ORAL | Status: DC
  Administered 2012-08-31 – 2012-09-16 (×32): 25 mg via ORAL
  Filled 2012-08-31 (×36): qty 1

## 2012-08-31 MED ORDER — POTASSIUM CHLORIDE CRYS ER 20 MEQ PO TBCR
40.0000 meq | EXTENDED_RELEASE_TABLET | Freq: Once | ORAL | Status: AC
  Administered 2012-08-31: 40 meq via ORAL
  Filled 2012-08-31: qty 2

## 2012-08-31 MED ORDER — OCUVITE-LUTEIN PO CAPS
1.0000 | ORAL_CAPSULE | Freq: Two times a day (BID) | ORAL | Status: DC
  Administered 2012-08-31 (×2): 1 via ORAL
  Filled 2012-08-31 (×5): qty 1

## 2012-08-31 NOTE — Evaluation (Signed)
Physical Therapy Evaluation Patient Details Name: [name] PEROTTI Sr. MRN: [id number] DOB: [birth year] Today's Date: 08/31/2012 Time: 4098-1191 PT Time Calculation (min): 30 min  PT Assessment / Plan / Recommendation Clinical Impression    Pt admitted with pulmonary edema and . Pt currently with functional limitations due to the deficits listed below (PT Problem List). Pt will benefit from skilled PT to increase their independence and safety with mobility to allow discharge home with wife.      PT Assessment  Patient needs continued PT services    Follow Up Recommendations  Home health PT;Supervision/Assistance - 24 hour    Does the patient have the potential to tolerate intense rehabilitation      Barriers to Discharge        Equipment Recommendations  None recommended by PT    Recommendations for Other Services     Frequency Min 3X/week    Precautions / Restrictions Precautions Precautions: Fall   Pertinent Vitals/Pain See flow sheet      Mobility  Bed Mobility Bed Mobility: Supine to Sit;Sitting - Scoot to Edge of Bed Supine to Sit: 5: Supervision;HOB elevated Sitting - Scoot to Edge of Bed: 5: Supervision Details for Bed Mobility Assistance: incr time Transfers Transfers: Sit to Stand;Stand to Sit Sit to Stand: 4: Min guard;With upper extremity assist;From bed Stand to Sit: 4: Min guard;With upper extremity assist;With armrests;To chair/3-in-1 Details for Transfer Assistance: verbal cues for hand placement. Ambulation/Gait Ambulation/Gait Assistance: 4: Min guard Ambulation Distance (Feet): 60 Feet Assistive device: Rolling walker Ambulation/Gait Assistance Details: verbal cues to not overexert self Gait Pattern: Step-through pattern;Decreased step length - left;Decreased step length - right;Trunk flexed;Wide base of support Gait velocity: decr    Exercises     PT Diagnosis: Difficulty walking;Generalized weakness  PT Problem List: Decreased  strength;Decreased activity tolerance;Decreased balance;Decreased mobility;Decreased knowledge of use of DME PT Treatment Interventions: DME instruction;Gait training;Patient/family education;Functional mobility training;Therapeutic activities;Therapeutic exercise;Balance training   PT Goals Acute Rehab PT Goals PT Goal Formulation: With patient Time For Goal Achievement: 09/07/12 Potential to Achieve Goals: Good Pt will go Sit to Stand: with supervision PT Goal: Sit to Stand - Progress: Goal set today Pt will go Stand to Sit: with supervision PT Goal: Stand to Sit - Progress: Goal set today Pt will Ambulate: 51 - 150 feet;with supervision;with least restrictive assistive device PT Goal: Ambulate - Progress: Goal set today  Visit Information  Last PT Received On: 08/31/12 Assistance Needed: +1    Subjective Data  Subjective: Pt states he is glad to get up. Patient Stated Goal: Go home   Prior Functioning  Home Living Lives With: Spouse Available Help at Discharge: Family Type of Home: House Home Access: Level entry Home Layout: Two level Bathroom Shower/Tub: Health visitor: Handicapped height Home Adaptive Equipment: Walker - rolling;Shower chair with back Additional Comments: chair lift Prior Function Level of Independence: Independent with assistive device(s) Vocation: Retired Musician: [employer]    Cognition  Cognition Arousal/Alertness: Awake/alert Behavior During Therapy: WFL for tasks assessed/performed Overall Cognitive Status: Within Functional Limits for tasks assessed    Extremity/Trunk Assessment Right Lower Extremity Assessment RLE ROM/Strength/Tone: Deficits RLE ROM/Strength/Tone Deficits: grossly 4/5 Left Lower Extremity Assessment LLE ROM/Strength/Tone: Deficits LLE ROM/Strength/Tone Deficits: grossly 4/5   Balance Balance Balance Assessed: Yes Static Standing Balance Static Standing - Balance Support: Bilateral upper  extremity supported Static Standing - Level of Assistance: 5: Stand by assistance  End of Session PT - End of Session Equipment Utilized During  Treatment: Gait belt Activity Tolerance: Patient limited by fatigue Patient left: in chair;with call bell/phone within reach;with family/visitor present Nurse Communication: Mobility status  GP     [facility] 08/31/2012, 5:12 PM

## 2012-08-31 NOTE — Progress Notes (Signed)
PULMONARY  / CRITICAL CARE MEDICINE  Name: [name] LEDERMAN Sr. MRN: [id number] DOB: 1932/08/17    ADMISSION DATE:  08/27/2012 CONSULTATION DATE:  08/28/2012  REFERRING MD :  Berton Mount PRIMARY SERVICE:  [facility]  CHIEF COMPLAINT:  Short of breath  BRIEF PATIENT DESCRIPTION:  79 yo male former smoker presented with severe dyspnea from acute pulmonary edema, acute diastolic dysfunction, and new onset A fib with RVR.  He is followed by Dr. [name] for severe COPD, chronic respiratory failure on home oxygen, OSA on BiPAP, and NSCLC s/p RULectomy and adjuvant chemo in 2006.  PCCM consulted to assist with respiratory management, and to assist determination whether he is candidate for cardioversion.  He was recently treated as outpt for Lt ankle cellulitis.  SIGNIFICANT EVENTS: 6/12> Admit with A fib RVR, acute diastolic heart failure 6/14> Hemoptysis on Xarelto=> stopped, placed on Levaquin/ Solumedrol. 6/15> wife reports similar lumps of bloody phlegm thru the night; seems diminished this AM, persist rhonchi/congestion.  STUDIES:  6/12 Echo >> mod LVH, EF 65 to 70%, mild/mod LA and RA dilation 615 CT chest>>>Markedly asymmetric bilateral airspace disease. Airspace disease is most prominent, with areas of consolidation, in the posterior and inferior right upper lobe ( the patient has a history of right lower lobectomy). Findings in the right upper lobe are favored to be due to pneumonia or aspiration. 2. New ground-glass opacities in the left upper lobe and left lower lobe may be due to early infection. Underlying mild pulmonary edema cannot be excluded in this patient with cardiomegaly and small bilateral pleural effusions. 3. New 5 mm pulmonary nodule left upper lobe. 4. Cardiomegaly with three-vessel coronary artery disease and dual lead cardiac pacer. 5. Small bilateral pleural effusions 6. Aortic valve calcifications. This finding raises the possibility of aortic valvular stenosis.   LINES /  TUBES: PIV  CULTURES: Blood 6/12 >> MRSA screen 6/12 >> POSITIVE  ANTIBIOTICS: Doxycycline 6/10 >> 6/12 Vancomycin 6/12 >> Zosyn 6/12 >>   Subjective/Overnight:  Cont to have mod amt hemoptysis.  Dark red.  Wife very concerned about recurrence of lung ca as "this is how his cancer started".   VITAL SIGNS: Temp:  [97.4 F (36.3 C)-98.3 F (36.8 C)] 97.4 F (36.3 C) (06/16 0641) Pulse Rate:  [73-87] 73 (06/16 0641) Resp:  [19-24] 19 (06/16 0641) BP: (129-162)/(57-93) 144/86 mmHg (06/16 1015) SpO2:  [89 %-93 %] 93 % (06/16 0641) Weight:  [207 lb 0.2 oz (93.9 kg)] 207 lb 0.2 oz (93.9 kg) (06/16 0641) 4 liters [place]  PHYSICAL EXAMINATION: General:  Chronically ill appearing male, No distress, speaks in full sentences, using accessory muscles Neuro:  Alert, follows commands, normal strength, seems anxious HEENT:  No sinus tenderness, MP 3 airway, no LAN Cardiovascular:  Irregular, tachycardic, no murmur, small mid chest lump near port a cath - ?PAC infilatrated Lungs:  Decreased breath sounds, basilar rales, scattered b/l rhonchi & e xp wheezing Abdomen:  Soft, non tender, + bowel sounds Musculoskeletal:  2+ leg edema R>L    Recent Labs Lab 08/29/12 0400 08/30/12 0450 08/31/12 0500  NA 137 138 138  K 3.6 3.5 3.4*  CL 95* 96 96  CO2 27 28 30   BUN 47* 52* 57*  CREATININE 2.44* 2.21* 2.26*  GLUCOSE 146* 155* 198*    Recent Labs Lab 08/28/12 0500 08/29/12 0400 08/31/12 0500  HGB 11.0* 11.5* 11.5*  HCT 32.7* 33.8* 34.5*  WBC 21.3* 19.3* 10.5  PLT 256 297 347   Ct Chest Wo  Contrast  08/30/2012   *RADIOLOGY REPORT*  Clinical Data: Hemoptysis.  Pneumonia versus edema.  CT CHEST WITHOUT CONTRAST  Technique:  Multidetector CT imaging of the chest was performed following the standard protocol without IV contrast.  Comparison: Chest radiograph 08/30/2012 and chest CT 02/05/2012  Findings: Heart is enlarged.  Cardiomegaly appears stable. Stable prominent fat density,  consistent with lipoma, between the left and right atria.  Dual lead pacer is present.  There is heavy coronary artery atherosclerotic calcification of all three coronary arteries.  Aortic valvular calcifications are present.  A left IJ central venous catheter terminates in the superior vena cava. Thyroid gland and thoracic inlet appear within normal limits.  Heavy atherosclerotic calcification of the thoracic aorta, without aneurysm.  Several mediastinal lymph nodes are visualized, and within normal limits for size.  No pathologically enlarged lymph nodes are seen in the thorax.  Evaluation for the hilar lymph nodes is somewhat limited without intravenous contrast.  Small bilateral pleural effusions are present.  Multilevel degenerative changes of the thoracic spine.  Multiple benign hemangiomas in the thoracic spine vertebral bodies.  No acute or suspicious osseous abnormality.  No acute findings are identified in the abdomen.  Surgical clips are seen in the left upper quadrant.  There are postsurgical changes of the right lower lobectomy.  There is a background of centrilobular emphysema.  Stable scarring in the right upper lobe anteriorly.  No definite intralobular septal thickening.  There is markedly asymmetric airspace disease in the inferior and posterior aspect of the right upper lobe, where there are areas of consolidation.  There are ground-glass opacities which are new compared to prior chest CT in the left upper lobe, left lower lobe, and right middle lobe.  5 mm pulmonary nodule in the left upper lobe peripherally on image #27 of the lung windows is new.  IMPRESSION:  1.  Markedly asymmetric bilateral airspace disease.  Airspace disease is most prominent, with areas of consolidation, in the posterior and inferior right upper lobe ( the patient has a history of right lower lobectomy).  Findings in the right upper lobe are favored to be due to pneumonia or aspiration. 2.  New ground-glass opacities in  the left upper lobe and left lower lobe may be due to early infection.  Underlying mild pulmonary edema cannot be excluded in this patient with cardiomegaly and small bilateral pleural effusions. 3.  New 5 mm pulmonary nodule left upper lobe. If the patient is at high risk for bronchogenic carcinoma, follow-up chest CT at 6-12 months is recommended.  If the patient is at low risk for bronchogenic carcinoma, follow-up chest CT at 12 months is recommended.  This recommendation follows the consensus statement: Guidelines for Management of Small Pulmonary Nodules Detected on CT Scans: A Statement from the Fleischner Society as published in Radiology 2005; 237:395-400. 4.  Cardiomegaly with three-vessel coronary artery disease and dual lead cardiac pacer. 5.  Small bilateral pleural effusions 6.  Aortic valve calcifications. This finding raises the possibility of aortic valvular stenosis.  .   Original Report Authenticated By: Britta Mccreedy, [name]   Dg Chest Port 1 View  08/31/2012   *RADIOLOGY REPORT*  Clinical Data: Follow-up edema  PORTABLE CHEST - 1 VIEW  Comparison: Yesterday  Findings: The tip of the left subclavian Port-A-Cath has changed its orientation.  Based on a single view, it is likely in the azygos vein.  Right subclavian dual lead pacemaker device and leads are stable and intact.  Bilateral pleural  effusions left greater than right are stable.  Diffuse edema is stable. Cardiomegaly.  IMPRESSION: Left subclavian Port-A-Cath tip has changed its position and is likely in the azygos vein.  This can be confirmed with a lateral view.  Stable airspace disease and pleural effusions.   Original Report Authenticated By: Jolaine Click, [name]   Dg Chest Port 1 View  08/30/2012   *RADIOLOGY REPORT*  Clinical Data: Respiratory failure.  PORTABLE CHEST - 1 VIEW  Comparison: 08/29/2012 and 08/27/2012  Findings: Prominent interstitial markings could represent underlying edema.  There are persistent interstitial and  airspace densities in the right mid and lower lung region.  Heart size remains enlarged.  Again noted is a right dual lead cardiac pacemaker.  Left subclavian Port-A-Cath in the SVC region.  Aortic arch is heavily calcified.  IMPRESSION:  Prominent interstitial markings may represent underlying edema. Again noted are increased densities in the right mid and lower lung region which could represent asymmetric edema but cannot exclude an infectious etiology.   Original Report Authenticated By: Richarda Overlie, [name]    BNP (last 3 results)  Recent Labs  08/27/12 0350 08/28/12 0500 08/31/12 0500  PROBNP 3385.0* 6086.0* 7566.0*     ASSESSMENT / PLAN:  Hemoptysis in setting of COPD exac and anticoagulation for new AFib. ?r/t new pulm nodule as below.  Plan: -Anticoag (Xarelto) stopped for now -continue COPD Rx as below... -cont po levaquin and IV solumedrol for now  -f/u CXR & labs   New LUL 5mm pulm nodule.  (Previously followed by Dr Welton Flakes for oncology) PLAN: -?further w/u new pulmonary nodule - do not think he would tolerate FOB at this time.   -If this is recurrence of cancer may need to discuss goals of care given his severe deconditioning, new Afib, renal failure, etc.   Acute on chronic respiratory failure 2nd to acute pulmonary edema from acute diastolic heart failure and new onset A fib with RVR.  This in setting of severe COPD and OSA.  Now c/b hemoptysis. Pneumonia seems less likely. Plan: -Cont oxygen with goal SpO2 > 90% -BiPAP qhs and prn during the day -negative fluid balance per cardiology and primary team - caution with renal insuff.  -continue GFN 1200mg  Bid -cont brovana/pulmicort nebulizer BID with prn xopenex nebulizer >> d/c dulera while on this regimen -cont cautious diuresis per cards    Lt ankle cellulitis. Plan: -Abx per primary team   Renal Insuffic:  Cr=2.44 Plan: -per primary team   Insomnia. Plan: -prn xanax, trazodone   WHITEHEART,KATHRYN,  NP 08/31/2012  11:06 AM Pager: [phone] or [phone]  *Care during the described time interval was provided by me and/or other providers on the critical care team. I have reviewed this patient's available data, including medical history, events of note, physical examination and test results as part of my evaluation.  This pt needs an FOB, scheduled for 1030AM 09/01/12  Dorcas Carrow Beeper  [phone]  Cell  [phone]  If no response or cell goes to voicemail, call beeper [phone]

## 2012-08-31 NOTE — Progress Notes (Signed)
[facility] Progress Note   [name] SANTILLI Sr. WUJ:[id number] DOB: 1932-07-12 DOA: 08/27/2012 PCP: [name], MD  HPI/Subjective: Continues to have hemoptysis and concerning for patient and wife. Patient also with some SOB.  Brief narrative: 79 y.o. male with past medical history of chronic respiratory failure on 4 L of oxygen at home secondary to COPD, OSA, CHF, A. fib and CKD. Patient came into the hospital because of shortness of breath. He reported having shortness of breath for 4-5 days, associated with some cough, and subjective fevers. He was recently started on doxycycline for presumed cellulitis of left heel. The shortness of breath progressively worsened prompting him to seek medical care because of severe SOB. He was placed on BiPAP. In the ED his chest x-ray was negative for pneumonia but theres were small bilateral effusions and vascular congestion consistent with CHF. Labs revealed significant leukocytosis of 21,000.   Assessment/Plan:  Acute-on-chronic respiratory failure with hypoxia due to:   CHF (congestive heart failure)/? diastolic dysfunction   CHRONIC OBSTRUCTIVE PULMONARY DISEASE, SEVERE -has been weaned back to home rate of oxygen, BiPAP discontinued. -has diuresed neg -3501.9 cc since admit -Cards managing HF and Lasix infusion -could have atypical pulmonary infection, but doubt - will d/c abx and follow clinically  -cont supportive care with nebs/MDI and home dose Prednisone -Levaquin added, Zosyn vancomycin was discontinued. -Patient has leukocytosis, minimal layering tray and elevated procalcitonin consistent with infection.  Hemoptysis -Patient was on heparin drip, in setting of mild pulmonary edema and COPD exacerbation. -Anticoagulation was discontinued. CT scan of the chest without contrast with markedly asymmetric bilateral airspace disease with areas of consolidation in the posterior and inferior right upper lobe. A be secondary to pneumonia versus  aspiration. New groundglass opacities in the left upper lobe and left lower lobe. Underlying mild pulmonary edema cannot be excluded. New 5 mm pulmonary nodule left upper lobe. Small bilateral pleural effusions. Aortic valve calcifications. Due to patient's history of lung cancer patient has been scheduled for bronchoscopy tomorrow per pulmonary. Will likely need to hold on anticoagulations secondary to hemoptysis. Pulmonary to advise when and if patient can be anticoagulated. -Cardiology is considering DCCV, patient has to be on heparin.  Per pulmonary.  CKD (chronic kidney disease), stage III -baseline is 28/1.8 in March of 2014 -Renal function worsened, overdiuresis with suspected and Lasix was stopped. -Patient has significant lower extremity edema, chest x-ray shows pulmonary edema as well. Diuresis carefully monitor renal function.    Leukocytosis -Etiology unclear so will continue anbx's, likely from his COPD exacerbation -Both legs reddened but exam without pain which would be expected if had severe cellulitis given very high WBC. -Improving.  CORONARY ARTERY DISEASE (nonobstructive disease 2006 catheterization)/Elevation of cardiac enzymes -no CP and likely due to acute HF, demand ischemia from recent hypoxia and worsened renal function  HTN (hypertension) -BP controlled -cont home meds  DM (diabetes mellitus) type II controlled with renal manifestation -controlled  -cont SSI  OSA (obstructive sleep apnea)  Atrial fibrillation with controlled ventricular rates - recent onset -cont BB and CCB. Per cardiology.  ? Cellulitis of left ankle -has redness both legs c/w stasis dermatitis  Hypokalemia Due to lasix - replace and follow   NSCLC s/p RULectomy and adjuvant chemo in 2006  DVT prophylaxis: On full dose heparin infusion Code Status: Full Family Communication: Patient and wife at bedside Disposition Plan: Remains inpatient Isolation: Contact isolation for MRSA  positive PCR  Consultants: Cardiology PCCM (per Cards)  Procedures: 2-D echocardiogram -  Left ventricle: The cavity size was normal. Wall thickness was increased in a pattern of moderate LVH. Systolic function was vigorous. The estimated ejection fraction was in the range of 65% to 70%. Wall motion was normal; there were no regional wall motion abnormalities. - Left atrium: The atrium was mildly to moderately dilated. - Right atrium: The atrium was mildly to moderately dilated.   Antibiotics: Levaquin 6/11 Zosyn 6/12 >>>6/13 Vancomycin 6/12 >>>6/13   Objective: Blood pressure 144/86, pulse 73, temperature 97.4 F (36.3 C), temperature source Axillary, resp. rate 19, height 5\' 7"  (1.702 m), weight 93.9 kg (207 lb 0.2 oz), SpO2 90.00%.  Intake/Output Summary (Last 24 hours) at 08/31/12 1826 Last data filed at 08/31/12 1400  Gross per 24 hour  Intake    963 ml  Output   1550 ml  Net   -587 ml     Exam: General: No acute respiratory distress at rest  Lungs: Clear to auscultation bilaterally without wheezes or crackles,  4L Cardiovascular: Regular rate without murmur gallop or rub normal S1 and S2, 2-3+ bilateral lower extremity peripheral edema  Abdomen: Nontender, nondistended, soft, bowel sounds positive, no rebound, no ascites, no appreciable mass Musculoskeletal: No significant cyanosis, clubbing of bilateral lower extremities Neurological: Alert and oriented x 3, moves all extremities x 4 without focal neurological deficits, CN 2-12 intact  Scheduled Meds: Scheduled Meds: . arformoterol  15 mcg Nebulization BID  . atorvastatin  40 mg Oral QHS  . budesonide (PULMICORT) nebulizer solution  0.5 mg Nebulization BID  . citalopram  10 mg Oral Daily  . diltiazem  180 mg Oral BID  . ezetimibe  10 mg Oral Daily  . furosemide  120 mg Intravenous BID  . guaiFENesin  1,200 mg Oral BID  . hydrALAZINE  50 mg Oral BID  . insulin aspart  0-9 Units Subcutaneous TID WC  .  levofloxacin  500 mg Oral Q48H  . magnesium oxide  400 mg Oral Daily  . methylPREDNISolone (SOLU-MEDROL) injection  40 mg Intravenous Q12H  . metoprolol succinate  25 mg Oral BID WC  . multivitamin-lutein  1 capsule Oral BID  . mupirocin ointment  1 application Nasal BID  . pantoprazole  40 mg Oral Daily  . sodium chloride  3 mL Intravenous Q12H  . tiotropium  18 mcg Inhalation Daily   Continuous Infusions:    Data Reviewed: Basic Metabolic Panel:  Recent Labs Lab 08/27/12 0349 08/28/12 0500 08/29/12 0400 08/30/12 0450 08/31/12 0500  NA 138 137 137 138 138  K 3.7 3.1* 3.6 3.5 3.4*  CL 98 97 95* 96 96  CO2 28 27 27 28 30   GLUCOSE 196* 153* 146* 155* 198*  BUN 33* 42* 47* 52* 57*  CREATININE 2.10* 2.32* 2.44* 2.21* 2.26*  CALCIUM 8.8 8.3* 8.5 8.6 8.9  MG  --  1.9 2.0  --   --    Liver Function Tests:  Recent Labs Lab 08/25/12 1653 08/31/12 0500  AST 23 20  ALT 25 22  ALKPHOS 76 71  BILITOT 0.8 0.4  PROT 6.2 6.2  ALBUMIN 3.1* 2.8*   CBC:  Recent Labs Lab 08/25/12 1653 08/27/12 0349 08/28/12 0500 08/29/12 0400 08/31/12 0500  WBC 14.6* 20.9* 21.3* 19.3* 10.5  NEUTROABS 12.6* 17.0*  --   --  9.5*  HGB 12.6* 12.0* 11.0* 11.5* 11.5*  HCT 38.2* 36.1* 32.7* 33.8* 34.5*  MCV 96.4 93.5 92.1 92.3 92.2  PLT 278.0 284 256 297 347   Cardiac Enzymes:  Recent Labs Lab 08/27/12 0350 08/27/12 0830 08/27/12 1405 08/27/12 1958  TROPONINI <0.30 0.81* 1.36* 1.71*   BNP (last 3 results)  Recent Labs  08/27/12 0350 08/28/12 0500 08/31/12 0500  PROBNP 3385.0* 6086.0* 7566.0*   CBG:  Recent Labs Lab 08/30/12 1839 08/30/12 2130 08/31/12 0749 08/31/12 1238 08/31/12 1751  GLUCAP 218* 218* 179* 201* 183*    Recent Results (from the past 240 hour(s))  CULTURE, BLOOD (ROUTINE X 2)     Status: None   Collection Time    08/27/12  8:30 AM      Result Value Range Status   Specimen Description BLOOD LEFT HAND   Final   Special Requests BOTTLES DRAWN AEROBIC  ONLY 5CC   Final   Culture  Setup Time 08/27/2012 14:16   Final   Culture     Final   Value:        BLOOD CULTURE RECEIVED NO GROWTH TO DATE CULTURE WILL BE HELD FOR 5 DAYS BEFORE ISSUING A FINAL NEGATIVE REPORT   Report Status PENDING   Incomplete  MRSA PCR SCREENING     Status: Abnormal   Collection Time    08/27/12  8:38 AM      Result Value Range Status   MRSA by PCR POSITIVE (*) NEGATIVE Final   Comment:            The GeneXpert MRSA Assay (FDA     approved for NASAL specimens     only), is one component of a     comprehensive MRSA colonization     surveillance program. It is not     intended to diagnose MRSA     infection nor to guide or     monitor treatment for     MRSA infections.     RESULT CALLED TO, READ BACK BY AND VERIFIED WITH:     Esperanza Richters AT 1148 08/27/12 BY K BARR  CULTURE, BLOOD (ROUTINE X 2)     Status: None   Collection Time    08/27/12  8:40 AM      Result Value Range Status   Specimen Description BLOOD LEFT HAND   Final   Special Requests BOTTLES DRAWN AEROBIC ONLY 4CC   Final   Culture  Setup Time 08/27/2012 14:16   Final   Culture     Final   Value:        BLOOD CULTURE RECEIVED NO GROWTH TO DATE CULTURE WILL BE HELD FOR 5 DAYS BEFORE ISSUING A FINAL NEGATIVE REPORT   Report Status PENDING   Incomplete  URINE CULTURE     Status: None   Collection Time    08/27/12  8:28 PM      Result Value Range Status   Specimen Description URINE, CLEAN CATCH   Final   Special Requests NONE   Final   Culture  Setup Time 08/28/2012 09:51   Final   Colony Count NO GROWTH   Final   Culture NO GROWTH   Final   Report Status 08/29/2012 FINAL   Final  CULTURE, EXPECTORATED SPUTUM-ASSESSMENT     Status: None   Collection Time    08/31/12  8:38 AM      Result Value Range Status   Specimen Description SPUTUM   Final   Special Requests NONE   Final   Sputum evaluation     Final   Value: MICROSCOPIC FINDINGS SUGGEST THAT THIS SPECIMEN IS NOT REPRESENTATIVE OF LOWER  RESPIRATORY SECRETIONS. PLEASE RECOLLECT.  CALLED TO Mackie Pai 08/31/12 0913 BY K SCHULTZ   Report Status 08/31/2012 FINAL   Final  CULTURE, EXPECTORATED SPUTUM-ASSESSMENT     Status: None   Collection Time    08/31/12  1:10 PM      Result Value Range Status   Specimen Description SPUTUM   Final   Special Requests NONE   Final   Sputum evaluation     Final   Value: THIS SPECIMEN IS ACCEPTABLE. RESPIRATORY CULTURE REPORT TO FOLLOW.   Report Status 08/31/2012 FINAL   Final     Studies:  Recent x-ray studies have been reviewed in detail by the Attending Physician  Scheduled Meds:  Reviewed in detail by the Attending Physician   [facility] Office  [phone] Pager [phone]  **If unable to reach the above provider after paging please contact the Flow Manager @ [phone]  On-Call/Text Page:      [email or web address]      password [email or web address]  If 7PM-7AM, please contact night-coverage [email or web address] Password [facility] 08/31/2012, 6:26 PM   LOS: 4 days

## 2012-08-31 NOTE — Care Management Note (Unsigned)
    Page 1 of 2   09/03/2012     4:37:43 PM   CARE MANAGEMENT NOTE 09/03/2012  Patient:  XIAN, ALVES   Account Number:  [id number]  Date Initiated:  08/27/2012  Documentation initiated by:  Alvira Philips Assessment:   79 yr-old male adm with dx of resp failure; lives with spouse, has O2 and CPAP through [employer]     Action/Plan:   pt/ot eval-rec  hhpt   Anticipated DC Date:  09/06/2012   Anticipated DC Plan:  HOME W HOME HEALTH SERVICES      DC Planning Services  CM consult      [facility] Choice  HOME HEALTH   Choice offered to / List presented to:  C-3 Spouse        HH arranged  HH-1 RN  HH-2 PT      HH agency  [facility]   Status of service:  In process, will continue to follow Medicare Important Message given?   (If response is "NO", the following Medicare IM given date fields will be blank) Date Medicare IM given:   Date Additional Medicare IM given:    Discharge Disposition:    Per UR Regulation:  Reviewed for med. necessity/level of care/duration of stay  If discussed at Long Length of Stay Meetings, dates discussed:    Comments:  PCP: Dr [name]  09/03/12 16:36 Letha Cape RN, BSN [phone] patient 's spouse chose [facility] for [facility] for tele health monitoring and HHPT.  Referral made to [facility], Lupita Leash notified. Soc will begin 24-48 hrs post discharge.  08/31/12 16:31 Letha Cape RN, BSN [phone] patient lives with spouse, patient is on 4l oxgen at home. patient has had hemoptysis, cre is elevated, cards following, pna showing on ct, pulmonary following as well. NCM will continue to follow for dc needs.  Per physical therapy recs hhpt.

## 2012-08-31 NOTE — Progress Notes (Signed)
Patient Name: [name] ARO Sr.      SUBJECTIVE: sob and wife anxious about recurrence lung cancer given hemoptysis  Past Medical History  Diagnosis Date  . Stricture and stenosis of esophagus 12/07/2004    EGD done on 12/07/2004  . GERD (gastroesophageal reflux disease)   . Hypoxemia     With chronic respiratory failure  . Increased prostate specific antigen (PSA) velocity 02/26/2011  . DM (diabetes mellitus) 02/26/2011  . Gout 02/26/2011  . COPD (chronic obstructive pulmonary disease)     oxygen at home, 4L  . OSA (obstructive sleep apnea) 02/26/2011    CPAP  . Paroxysmal atrial fibrillation   . Depression   . Cardiac arrest 2009    while in hospital   . Hypertension   . HTN (hypertension) 02/26/2011  . Cardiac arrest 2009    while in hospital  . Lung cancer     lung s/p lobectomy x2 on RT lung  . Basal cell carcinoma of nose 02/26/2011  . ETOH abuse 01/29/2012    started drinking again - hx cardiac arrest in 2009 due to dt's with organ failure  . CKD (chronic kidney disease) 02/28/2012  . Chronic diastolic CHF (congestive heart failure)     a. EF 55-60% by echo 2013.  . Sick sinus syndrome     a. s/p [employer]onic pacemaker 2010.  . Bradycardia     a. Admitted 2010 with severe bradycardia, runs of paroxysmal VT.   Marland Kitchen Paroxysmal VT     a. In 2010 in setting of marked bradycardia.  Marland Kitchen CAD (coronary artery disease)     a. Nonobstructive CAD by cath 2006.  Marland Kitchen Esophageal ulcer     a. By EGD 10/2011.  Marland Kitchen Hiatal hernia     a. By EGD 10/2011.  . Valvular heart disease     a. Mild MR/mild AI/mild AS by echo 05/2011.    Scheduled Meds:  Scheduled Meds: . arformoterol  15 mcg Nebulization BID  . atorvastatin  40 mg Oral QHS  . budesonide (PULMICORT) nebulizer solution  0.5 mg Nebulization BID  . citalopram  10 mg Oral Daily  . diltiazem  180 mg Oral BID  . ezetimibe  10 mg Oral Daily  . furosemide  80 mg Intravenous Q8H  . guaiFENesin  1,200 mg Oral BID  .  hydrALAZINE  50 mg Oral BID  . insulin aspart  0-9 Units Subcutaneous TID WC  . levofloxacin  500 mg Oral Q48H  . magnesium oxide  400 mg Oral Daily  . methylPREDNISolone (SOLU-MEDROL) injection  40 mg Intravenous Q12H  . metoprolol succinate  50 mg Oral BID WC  . mupirocin ointment  1 application Nasal BID  . pantoprazole  40 mg Oral Daily  . sodium chloride  3 mL Intravenous Q12H  . tiotropium  18 mcg Inhalation Daily   Continuous Infusions:   PHYSICAL EXAM Filed Vitals:   08/30/12 2034 08/30/12 2100 08/31/12 0129 08/31/12 0641  BP:  162/93  129/73  Pulse:  87  73  Temp:  98.2 F (36.8 C)  97.4 F (36.3 C)  TempSrc:  Oral  Axillary  Resp:  20  19  Height:      Weight:    207 lb 0.2 oz (93.9 kg)  SpO2: 92% 92% 89% 93%   Well developed and nourished in mod resp distress Neck supple    ronchi decreased BS Irregularly irregular rate and rhythm with controlled ventricular response, no murmurs or gallops  Abd-soft with active BS without hepatomegaly No Clubbing cyanosis 2-3+ edema Skin-warm and dry A & Oriented  Grossly normal sensory and motor function   TELEMETRY: Reviewed telemetry pt in *afib with controlled Vr   Intake/Output Summary (Last 24 hours) at 08/31/12 0909 Last data filed at 08/31/12 0650  Gross per 24 hour  Intake    240 ml  Output   1675 ml  Net  -1435 ml    LABS: Basic Metabolic Panel:  Recent Labs Lab 08/25/12 1653 08/27/12 0349 08/28/12 0500 08/29/12 0400 08/30/12 0450 08/31/12 0500  NA 139 138 137 137 138 138  K 4.1 3.7 3.1* 3.6 3.5 3.4*  CL 102 98 97 95* 96 96  CO2 29 28 27 27 28 30   GLUCOSE 172* 196* 153* 146* 155* 198*  BUN 33* 33* 42* 47* 52* 57*  CREATININE 2.4* 2.10* 2.32* 2.44* 2.21* 2.26*  CALCIUM 8.9 8.8 8.3* 8.5 8.6 8.9  MG  --   --  1.9 2.0  --   --    Cardiac Enzymes: No results found for this basename: CKTOTAL, CKMB, CKMBINDEX, TROPONINI,  in the last 72 hours CBC:  Recent Labs Lab 08/25/12 1653 08/27/12 0349  08/28/12 0500 08/29/12 0400 08/31/12 0500  WBC 14.6* 20.9* 21.3* 19.3* 10.5  NEUTROABS 12.6* 17.0*  --   --  9.5*  HGB 12.6* 12.0* 11.0* 11.5* 11.5*  HCT 38.2* 36.1* 32.7* 33.8* 34.5*  MCV 96.4 93.5 92.1 92.3 92.2  PLT 278.0 284 256 297 347   PROTIME: No results found for this basename: LABPROT, INR,  in the last 72 hours Liver Function Tests:  Recent Labs  08/31/12 0500  AST 20  ALT 22  ALKPHOS 71  BILITOT 0.4  PROT 6.2  ALBUMIN 2.8*   No results found for this basename: LIPASE, AMYLASE,  in the last 72 hours BNP: BNP (last 3 results)  Recent Labs  08/27/12 0350 08/28/12 0500 08/31/12 0500  PROBNP 3385.0* 6086.0* 7566.0*     ASSESSMENT AND PLAN:  Principal Problem:   Acute-on-chronic respiratory failure Active Problems:   CARCINOMA, LUNG, SQUAMOUS CELL   CHRONIC OBSTRUCTIVE PULMONARY DISEASE, SEVERE   HTN (hypertension)   DM (diabetes mellitus) type II controlled with renal manifestation   Atrial fibrillation   CKD (chronic kidney disease), stage III   Cellulitis of left ankle   Acute on chronic diastolic congestive heart failure   Leukocytosis   Elevation of cardiac enzymes   Hemoptysis  It seems the biggest challenge right now is his hemoptysis and the impact it has on our ability to anticoagulate to see if by restoring rhythm that we improve his HFpEF. Diuresis is a two edged sword as his renal function is worsening;  i wonder whether any of the BUN increase is related to GI blood 2/2 swallowed hemoptysis Agree will continue diurisis with trepidation given volume overload and dyspnea   Signed, Sherryl Manges MD  08/31/2012

## 2012-09-01 ENCOUNTER — Inpatient Hospital Stay (HOSPITAL_COMMUNITY): Payer: Medicare Other

## 2012-09-01 ENCOUNTER — Encounter (HOSPITAL_COMMUNITY): Payer: Self-pay | Admitting: Critical Care Medicine

## 2012-09-01 ENCOUNTER — Encounter (HOSPITAL_COMMUNITY)
Admission: EM | Disposition: A | Discharge status: Home Health | Payer: Self-pay | Source: Home / Self Care | Attending: Internal Medicine

## 2012-09-01 HISTORY — PX: VIDEO BRONCHOSCOPY: SHX5072

## 2012-09-01 LAB — BASIC METABOLIC PANEL
BUN: 70 mg/dL — ABNORMAL HIGH (ref 6–23)
CO2: 30 mEq/L (ref 19–32)
GFR calc non Af Amer: 25 mL/min — ABNORMAL LOW (ref >90)
Glucose, Bld: 207 mg/dL — ABNORMAL HIGH (ref 70–99)
Potassium: 3.6 mEq/L (ref 3.5–5.1)
Sodium: 136 mEq/L (ref 135–145)

## 2012-09-01 LAB — CBC
HCT: 33.9 % — ABNORMAL LOW (ref 39.0–52.0)
Hemoglobin: 11.3 g/dL — ABNORMAL LOW (ref 13.0–17.0)
MCHC: 33.3 g/dL (ref 30.0–36.0)
RBC: 3.7 MIL/uL — ABNORMAL LOW (ref 4.22–5.81)

## 2012-09-01 LAB — BLOOD GAS, ARTERIAL
Acid-Base Excess: 5 mmol/L — ABNORMAL HIGH (ref 0.0–2.0)
Drawn by: 331001
O2 Content: 4 L/min
O2 Saturation: 96.1 %
Patient temperature: 97.3

## 2012-09-01 LAB — GLUCOSE, CAPILLARY
Glucose-Capillary: 201 mg/dL — ABNORMAL HIGH (ref 70–99)
Glucose-Capillary: 216 mg/dL — ABNORMAL HIGH (ref 70–99)

## 2012-09-01 SURGERY — VIDEO BRONCHOSCOPY WITHOUT FLUORO
Anesthesia: Moderate Sedation

## 2012-09-01 MED ORDER — PHENYLEPHRINE HCL 0.25 % NA SOLN
1.0000 | Freq: Four times a day (QID) | NASAL | Status: DC | PRN
  Filled 2012-09-01: qty 15

## 2012-09-01 MED ORDER — FENTANYL CITRATE 0.05 MG/ML IJ SOLN
INTRAMUSCULAR | Status: DC | PRN
  Administered 2012-09-01: 25 ug via INTRAVENOUS

## 2012-09-01 MED ORDER — PROSIGHT PO TABS
1.0000 | ORAL_TABLET | Freq: Every day | ORAL | Status: DC
  Administered 2012-09-01 – 2012-09-16 (×16): 1 via ORAL
  Filled 2012-09-01 (×16): qty 1

## 2012-09-01 MED ORDER — MIDAZOLAM HCL 5 MG/ML IJ SOLN
INTRAMUSCULAR | Status: AC
  Filled 2012-09-01: qty 2

## 2012-09-01 MED ORDER — LIDOCAINE HCL (PF) 1 % IJ SOLN
INTRAMUSCULAR | Status: DC | PRN
  Administered 2012-09-01: 5 mL

## 2012-09-01 MED ORDER — INSULIN GLARGINE 100 UNIT/ML ~~LOC~~ SOLN
5.0000 [IU] | Freq: Every day | SUBCUTANEOUS | Status: DC
  Administered 2012-09-01: 5 [IU] via SUBCUTANEOUS
  Filled 2012-09-01 (×2): qty 0.05

## 2012-09-01 MED ORDER — BUTAMBEN-TETRACAINE-BENZOCAINE 2-2-14 % EX AERO: 1.0000 | INHALATION_SPRAY | Freq: Once | CUTANEOUS | Status: DC

## 2012-09-01 MED ORDER — LIDOCAINE HCL 2 % EX GEL: Freq: Once | CUTANEOUS | Status: DC

## 2012-09-01 MED ORDER — FENTANYL CITRATE 0.05 MG/ML IJ SOLN
INTRAMUSCULAR | Status: AC
  Filled 2012-09-01: qty 4

## 2012-09-01 MED ORDER — MIDAZOLAM HCL 10 MG/2ML IJ SOLN
INTRAMUSCULAR | Status: DC | PRN
  Administered 2012-09-01: 2 mg via INTRAVENOUS

## 2012-09-01 MED ORDER — PHENYLEPHRINE HCL 0.25 % NA SOLN
NASAL | Status: DC | PRN
  Administered 2012-09-01: 2 via NASAL

## 2012-09-01 MED ORDER — LIDOCAINE HCL 2 % EX GEL
CUTANEOUS | Status: DC | PRN
  Administered 2012-09-01: 1

## 2012-09-01 NOTE — Progress Notes (Signed)
I agree with above Shan Levans

## 2012-09-01 NOTE — Progress Notes (Signed)
Pt had a 7 beat run of vtach. Vitals stable -BP 123/73, Pulse 77, O2 sat 93% on 4L. Pt resting comfortably in the chair. On call NP notified, no new orders given.

## 2012-09-01 NOTE — Progress Notes (Signed)
Physical Therapy Treatment Patient Details Name: [name] DOLECKI Sr. MRN: [id number] DOB: 05/02/1932 Today's Date: 09/01/2012 Time: 0865-7846 PT Time Calculation (min): 13 min  PT Assessment / Plan / Recommendation Comments on Treatment Session  Continues to demonstrate decreased activity tolerance. Mod cues for pursed lip breathing while ambulating. Short session due to transport waiting to take pt for procedure.     Follow Up Recommendations  Home health PT;Supervision/Assistance - 24 hour     Does the patient have the potential to tolerate intense rehabilitation     Barriers to Discharge        Equipment Recommendations  None recommended by PT    Recommendations for Other Services    Frequency Min 3X/week   Plan Discharge plan remains appropriate    Precautions / Restrictions Precautions Precautions: Fall Restrictions Weight Bearing Restrictions: No   Pertinent Vitals/Pain Pt denies pain. Dyspnea 3/4 with ambulation.    Mobility  Bed Mobility Bed Mobility: Supine to Sit;Sit to Supine Supine to Sit: 6: Modified independent (Device/Increase time);HOB elevated Sit to Supine: 6: Modified independent (Device/Increase time);HOB elevated Details for Bed Mobility Assistance: Increased time.  Transfers Transfers: Sit to Stand;Stand to Sit Sit to Stand: 4: Min guard;From bed;With upper extremity assist Stand to Sit: 5: Supervision;To bed;With upper extremity assist Details for Transfer Assistance: verbal cues for hand placement. Ambulation/Gait Ambulation/Gait Assistance: 4: Min guard Ambulation Distance (Feet): 100 Feet Assistive device: Rolling walker Ambulation/Gait Assistance Details: slow gait speed. dyspnea 3/4 towards end of ambulation. VCs pursed lip breathing. Pt fatigued after ~ 75 feet. Ambulated on 4L O2-pt O2 dependent Gait Pattern: Step-through pattern;Decreased stride length;Wide base of support    Exercises     PT Diagnosis:    PT Problem List:   PT  Treatment Interventions:     PT Goals Acute Rehab PT Goals Pt will go Sit to Stand: with supervision PT Goal: Sit to Stand - Progress: Progressing toward goal Pt will go Stand to Sit: with supervision PT Goal: Stand to Sit - Progress: Progressing toward goal Pt will Ambulate: 51 - 150 feet;with supervision;with least restrictive assistive device PT Goal: Ambulate - Progress: Progressing toward goal  Visit Information  Last PT Received On: 09/01/12 Assistance Needed: +1    Subjective Data  Subjective: I feel alright Patient Stated Goal: home   Cognition  Cognition Arousal/Alertness: Awake/alert Behavior During Therapy: WFL for tasks assessed/performed Overall Cognitive Status: Within Functional Limits for tasks assessed    Balance  Balance Balance Assessed: No  End of Session PT - End of Session Equipment Utilized During Treatment: Gait belt;Oxygen Activity Tolerance: Patient limited by fatigue Patient left: in bed;with call bell/phone within reach;with family/visitor present;with nursing in room   GP     Rebeca Alert, MPT Pager: [phone]

## 2012-09-01 NOTE — Interval H&P Note (Signed)
No interval change in H and P.  The bleeding has lessened.  Plan FOB and pt is ready for same. Dorcas Carrow Beeper  [phone]  Cell  [phone]  If no response or cell goes to voicemail, call beeper [phone]

## 2012-09-01 NOTE — Progress Notes (Signed)
Video bronchoscopy procedure performed. Bronchial washing intervention performed. 

## 2012-09-01 NOTE — H&P (View-Only) (Signed)
PULMONARY  / CRITICAL CARE MEDICINE  Name: [name] D Monaco Sr. MRN: [id number] DOB: 12/14/1932    ADMISSION DATE:  08/27/2012 CONSULTATION DATE:  08/28/2012  REFERRING MD :  Steve Klein PRIMARY SERVICE:  [facility]  CHIEF COMPLAINT:  Short of breath  BRIEF PATIENT DESCRIPTION:  79 yo male former smoker presented with severe dyspnea from acute pulmonary edema, acute diastolic dysfunction, and new onset A fib with RVR.  He is followed by Dr. Byrum for severe COPD, chronic respiratory failure on home oxygen, OSA on BiPAP, and NSCLC s/p RULectomy and adjuvant chemo in 2006.  PCCM consulted to assist with respiratory management, and to assist determination whether he is candidate for cardioversion.  He was recently treated as outpt for Lt ankle cellulitis.  SIGNIFICANT EVENTS: 6/12> Admit with A fib RVR, acute diastolic heart failure 6/14> Hemoptysis on Xarelto=> stopped, placed on Levaquin/ Solumedrol. 6/15> wife reports similar lumps of bloody phlegm thru the night; seems diminished this AM, persist rhonchi/congestion.  STUDIES:  6/12 Echo >> mod LVH, EF 65 to 70%, mild/mod LA and RA dilation 615 CT chest>>>Markedly asymmetric bilateral airspace disease. Airspace disease is most prominent, with areas of consolidation, in the posterior and inferior right upper lobe ( the patient has a history of right lower lobectomy). Findings in the right upper lobe are favored to be due to pneumonia or aspiration. 2. New ground-glass opacities in the left upper lobe and left lower lobe may be due to early infection. Underlying mild pulmonary edema cannot be excluded in this patient with cardiomegaly and small bilateral pleural effusions. 3. New 5 mm pulmonary nodule left upper lobe. 4. Cardiomegaly with three-vessel coronary artery disease and dual lead cardiac pacer. 5. Small bilateral pleural effusions 6. Aortic valve calcifications. This finding raises the possibility of aortic valvular stenosis.   LINES /  TUBES: PIV  CULTURES: Blood 6/12 >> MRSA screen 6/12 >> POSITIVE  ANTIBIOTICS: Doxycycline 6/10 >> 6/12 Vancomycin 6/12 >> Zosyn 6/12 >>   Subjective/Overnight:  Cont to have mod amt hemoptysis.  Dark red.  Wife very concerned about recurrence of lung ca as "this is how his cancer started".   VITAL SIGNS: Temp:  [97.4 F (36.3 C)-98.3 F (36.8 C)] 97.4 F (36.3 C) (06/16 0641) Pulse Rate:  [73-87] 73 (06/16 0641) Resp:  [19-24] 19 (06/16 0641) BP: (129-162)/(57-93) 144/86 mmHg (06/16 1015) SpO2:  [89 %-93 %] 93 % (06/16 0641) Weight:  [207 lb 0.2 oz (93.9 kg)] 207 lb 0.2 oz (93.9 kg) (06/16 0641) 4 liters [place]  PHYSICAL EXAMINATION: General:  Chronically ill appearing male, No distress, speaks in full sentences, using accessory muscles Neuro:  Alert, follows commands, normal strength, seems anxious HEENT:  No sinus tenderness, MP 3 airway, no LAN Cardiovascular:  Irregular, tachycardic, no murmur, small mid chest lump near port a cath - ?PAC infilatrated Lungs:  Decreased breath sounds, basilar rales, scattered b/l rhonchi & e xp wheezing Abdomen:  Soft, non tender, + bowel sounds Musculoskeletal:  2+ leg edema R>L    Recent Labs Lab 08/29/12 0400 08/30/12 0450 08/31/12 0500  NA 137 138 138  K 3.6 3.5 3.4*  CL 95* 96 96  CO2 27 28 30  BUN 47* 52* 57*  CREATININE 2.44* 2.21* 2.26*  GLUCOSE 146* 155* 198*    Recent Labs Lab 08/28/12 0500 08/29/12 0400 08/31/12 0500  HGB 11.0* 11.5* 11.5*  HCT 32.7* 33.8* 34.5*  WBC 21.3* 19.3* 10.5  PLT 256 297 347   Ct Chest Wo   Contrast  08/30/2012   *RADIOLOGY REPORT*  Clinical Data: Hemoptysis.  Pneumonia versus edema.  CT CHEST WITHOUT CONTRAST  Technique:  Multidetector CT imaging of the chest was performed following the standard protocol without IV contrast.  Comparison: Chest radiograph 08/30/2012 and chest CT 02/05/2012  Findings: Heart is enlarged.  Cardiomegaly appears stable. Stable prominent fat density,  consistent with lipoma, between the left and right atria.  Dual lead pacer is present.  There is heavy coronary artery atherosclerotic calcification of all three coronary arteries.  Aortic valvular calcifications are present.  A left IJ central venous catheter terminates in the superior vena cava. Thyroid gland and thoracic inlet appear within normal limits.  Heavy atherosclerotic calcification of the thoracic aorta, without aneurysm.  Several mediastinal lymph nodes are visualized, and within normal limits for size.  No pathologically enlarged lymph nodes are seen in the thorax.  Evaluation for the hilar lymph nodes is somewhat limited without intravenous contrast.  Small bilateral pleural effusions are present.  Multilevel degenerative changes of the thoracic spine.  Multiple benign hemangiomas in the thoracic spine vertebral bodies.  No acute or suspicious osseous abnormality.  No acute findings are identified in the abdomen.  Surgical clips are seen in the left upper quadrant.  There are postsurgical changes of the right lower lobectomy.  There is a background of centrilobular emphysema.  Stable scarring in the right upper lobe anteriorly.  No definite intralobular septal thickening.  There is markedly asymmetric airspace disease in the inferior and posterior aspect of the right upper lobe, where there are areas of consolidation.  There are ground-glass opacities which are new compared to prior chest CT in the left upper lobe, left lower lobe, and right middle lobe.  5 mm pulmonary nodule in the left upper lobe peripherally on image #27 of the lung windows is new.  IMPRESSION:  1.  Markedly asymmetric bilateral airspace disease.  Airspace disease is most prominent, with areas of consolidation, in the posterior and inferior right upper lobe ( the patient has a history of right lower lobectomy).  Findings in the right upper lobe are favored to be due to pneumonia or aspiration. 2.  New ground-glass opacities in  the left upper lobe and left lower lobe may be due to early infection.  Underlying mild pulmonary edema cannot be excluded in this patient with cardiomegaly and small bilateral pleural effusions. 3.  New 5 mm pulmonary nodule left upper lobe. If the patient is at high risk for bronchogenic carcinoma, follow-up chest CT at 6-12 months is recommended.  If the patient is at low risk for bronchogenic carcinoma, follow-up chest CT at 12 months is recommended.  This recommendation follows the consensus statement: Guidelines for Management of Small Pulmonary Nodules Detected on CT Scans: A Statement from the Fleischner Society as published in Radiology 2005; 237:395-400. 4.  Cardiomegaly with three-vessel coronary artery disease and dual lead cardiac pacer. 5.  Small bilateral pleural effusions 6.  Aortic valve calcifications. This finding raises the possibility of aortic valvular stenosis.  .   Original Report Authenticated By: Susan Turner, [name]   Dg Chest Port 1 View  08/31/2012   *RADIOLOGY REPORT*  Clinical Data: Follow-up edema  PORTABLE CHEST - 1 VIEW  Comparison: Yesterday  Findings: The tip of the left subclavian Port-A-Cath has changed its orientation.  Based on a single view, it is likely in the azygos vein.  Right subclavian dual lead pacemaker device and leads are stable and intact.  Bilateral pleural   effusions left greater than right are stable.  Diffuse edema is stable. Cardiomegaly.  IMPRESSION: Left subclavian Port-A-Cath tip has changed its position and is likely in the azygos vein.  This can be confirmed with a lateral view.  Stable airspace disease and pleural effusions.   Original Report Authenticated By: Arthur Hoss, [name]   Dg Chest Port 1 View  08/30/2012   *RADIOLOGY REPORT*  Clinical Data: Respiratory failure.  PORTABLE CHEST - 1 VIEW  Comparison: 08/29/2012 and 08/27/2012  Findings: Prominent interstitial markings could represent underlying edema.  There are persistent interstitial and  airspace densities in the right mid and lower lung region.  Heart size remains enlarged.  Again noted is a right dual lead cardiac pacemaker.  Left subclavian Port-A-Cath in the SVC region.  Aortic arch is heavily calcified.  IMPRESSION:  Prominent interstitial markings may represent underlying edema. Again noted are increased densities in the right mid and lower lung region which could represent asymmetric edema but cannot exclude an infectious etiology.   Original Report Authenticated By: Adam Henn, [name]    BNP (last 3 results)  Recent Labs  08/27/12 0350 08/28/12 0500 08/31/12 0500  PROBNP 3385.0* 6086.0* 7566.0*     ASSESSMENT / PLAN:  Hemoptysis in setting of COPD exac and anticoagulation for new AFib. ?r/t new pulm nodule as below.  Plan: -Anticoag (Xarelto) stopped for now -continue COPD Rx as below... -cont po levaquin and IV solumedrol for now  -f/u CXR & labs   New LUL 5mm pulm nodule.  (Previously followed by Dr Khan for oncology) PLAN: -?further w/u new pulmonary nodule - do not think he would tolerate FOB at this time.   -If this is recurrence of cancer may need to discuss goals of care given his severe deconditioning, new Afib, renal failure, etc.   Acute on chronic respiratory failure 2nd to acute pulmonary edema from acute diastolic heart failure and new onset A fib with RVR.  This in setting of severe COPD and OSA.  Now c/b hemoptysis. Pneumonia seems less likely. Plan: -Cont oxygen with goal SpO2 > 90% -BiPAP qhs and prn during the day -negative fluid balance per cardiology and primary team - caution with renal insuff.  -continue GFN 1200mg Bid -cont brovana/pulmicort nebulizer BID with prn xopenex nebulizer >> d/c dulera while on this regimen -cont cautious diuresis per cards    Lt ankle cellulitis. Plan: -Abx per primary team   Renal Insuffic:  Cr=2.44 Plan: -per primary team   Insomnia. Plan: -prn xanax, trazodone   WHITEHEART,KATHRYN,  NP 08/31/2012  11:06 AM Pager: [phone] or [phone]  *Care during the described time interval was provided by me and/or other providers on the critical care team. I have reviewed this patient's available data, including medical history, events of note, physical examination and test results as part of my evaluation.  This pt needs an FOB, scheduled for 1030AM 09/01/12  Kaylen Nghiem WrightMD Beeper  [phone]  Cell  [phone]  If no response or cell goes to voicemail, call beeper [phone]  

## 2012-09-01 NOTE — Progress Notes (Signed)
[name]  Patient Name: [name] Newcomer Sr.      SUBJECTIVE:   Wife at bedside.  States he slept well last night.  Still with hemoptysis.  Pending FOB today with Dr Wright.>>> no endobronhial lesions seen withblood from LLL  I/O - yesterday.  Weight unchanged.    Labs pending this morning Past Medical History  Diagnosis Date  . Stricture and stenosis of esophagus 12/07/2004    EGD done on 12/07/2004  . GERD (gastroesophageal reflux disease)   . Hypoxemia     With chronic respiratory failure  . Increased prostate specific antigen (PSA) velocity 02/26/2011  . DM (diabetes mellitus) 02/26/2011  . Gout 02/26/2011  . COPD (chronic obstructive pulmonary disease)     oxygen at home, 4L  . OSA (obstructive sleep apnea) 02/26/2011    CPAP  . Paroxysmal atrial fibrillation   . Depression   . Cardiac arrest 2009    while in hospital   . Hypertension   . HTN (hypertension) 02/26/2011  . Cardiac arrest 2009    while in hospital  . Lung cancer     lung s/p lobectomy x2 on RT lung  . Basal cell carcinoma of nose 02/26/2011  . ETOH abuse 01/29/2012    started drinking again - hx cardiac arrest in 2009 due to dt's with organ failure  . CKD (chronic kidney disease) 02/28/2012  . Chronic diastolic CHF (congestive heart failure)     a. EF 55-60% by echo 2013.  . Sick sinus syndrome     a. s/p [employer] pacemaker 2010.  . Bradycardia     a. Admitted 2010 with severe bradycardia, runs of paroxysmal VT.   Marland Kitchen Paroxysmal VT     a. In 2010 in setting of marked bradycardia.  Marland Kitchen CAD (coronary artery disease)     a. Nonobstructive CAD by cath 2006.  Marland Kitchen Esophageal ulcer     a. By EGD 10/2011.  Marland Kitchen Hiatal hernia     a. By EGD 10/2011.  . Valvular heart disease     a. Mild MR/mild AI/mild AS by echo 05/2011.    Scheduled Meds:  Scheduled Meds: . arformoterol  15 mcg Nebulization BID  . atorvastatin  40 mg Oral QHS  . budesonide (PULMICORT) nebulizer solution  0.5 mg Nebulization BID  .  citalopram  10 mg Oral Daily  . diltiazem  180 mg Oral BID  . ezetimibe  10 mg Oral Daily  . furosemide  120 mg Intravenous BID  . guaiFENesin  1,200 mg Oral BID  . hydrALAZINE  50 mg Oral BID  . insulin aspart  0-9 Units Subcutaneous TID WC  . insulin glargine  5 Units Subcutaneous QHS  . levofloxacin  500 mg Oral Q48H  . magnesium oxide  400 mg Oral Daily  . methylPREDNISolone (SOLU-MEDROL) injection  40 mg Intravenous Q12H  . metoprolol succinate  25 mg Oral BID WC  . multivitamin-lutein  1 capsule Oral BID  . pantoprazole  40 mg Oral Daily  . sodium chloride  3 mL Intravenous Q12H  . tiotropium  18 mcg Inhalation Daily   Continuous Infusions:   PHYSICAL EXAM Filed Vitals:   09/01/12 1101 09/01/12 1111 09/01/12 1121 09/01/12 1131  BP: 148/90 155/100 158/99 162/97  Pulse:      Temp: 97.5 F (36.4 C)     TempSrc: Oral     Resp: 23 25    Height:      Weight:      SpO2:  90% 91%      Mild resp distress Supple neck ronchi irr irr rate 2 edema Soft   TELEMETRY: Reviewed telemetry pt in  afib:    Intake/Output Summary (Last 24 hours) at 09/01/12 1253 Last data filed at 09/01/12 0900  Gross per 24 hour  Intake    400 ml  Output   1500 ml  Net  -1100 ml    LABS: Basic Metabolic Panel:  Recent Labs Lab 08/25/12 1653 08/27/12 0349 08/28/12 0500 08/29/12 0400 08/30/12 0450 08/31/12 0500 09/01/12 0454  NA 139 138 137 137 138 138 136  K 4.1 3.7 3.1* 3.6 3.5 3.4* 3.6  CL 102 98 97 95* 96 96 96  CO2 29 28 27 27 28 30 30   GLUCOSE 172* 196* 153* 146* 155* 198* 207*  BUN 33* 33* 42* 47* 52* 57* 70*  CREATININE 2.4* 2.10* 2.32* 2.44* 2.21* 2.26* 2.33*  CALCIUM 8.9 8.8 8.3* 8.5 8.6 8.9 8.9  MG  --   --  1.9 2.0  --   --   --    Cardiac Enzymes: No results found for this basename: CKTOTAL, CKMB, CKMBINDEX, TROPONINI,  in the last 72 hours CBC:  Recent Labs Lab 08/25/12 1653 08/27/12 0349 08/28/12 0500 08/29/12 0400 08/31/12 0500 09/01/12 0454  WBC  14.6* 20.9* 21.3* 19.3* 10.5 14.7*  NEUTROABS 12.6* 17.0*  --   --  9.5*  --   HGB 12.6* 12.0* 11.0* 11.5* 11.5* 11.3*  HCT 38.2* 36.1* 32.7* 33.8* 34.5* 33.9*  MCV 96.4 93.5 92.1 92.3 92.2 91.6  PLT 278.0 284 256 297 347 362   PROTIME: No results found for this basename: LABPROT, INR,  in the last 72 hours Liver Function Tests:  Recent Labs  08/31/12 0500  AST 20  ALT 22  ALKPHOS 71  BILITOT 0.4  PROT 6.2  ALBUMIN 2.8*   No results found for this basename: LIPASE, AMYLASE,  in the last 72 hours BNP: BNP (last 3 results)  Recent Labs  08/27/12 0350 08/28/12 0500 08/31/12 0500  PROBNP 3385.0* 6086.0* 7566.0*       ASSESSMENT AND PLAN:  Principal Problem:   Acute-on-chronic respiratory failure Active Problems:   CARCINOMA, LUNG, SQUAMOUS CELL   CHRONIC OBSTRUCTIVE PULMONARY DISEASE, SEVERE   HTN (hypertension)   DM (diabetes mellitus) type II controlled with renal manifestation   Atrial fibrillation   CKD (chronic kidney disease), stage III   Cellulitis of left ankle   Acute on chronic diastolic congestive heart failure   Leukocytosis   Elevation of cardiac enzymes   Hemoptysis  Pt not seen   Signed, Sherryl Manges MD  09/01/2012

## 2012-09-01 NOTE — Op Note (Signed)
Bronchoscopy Procedure Note  Date of Operation: 09/01/2012  Pre-op Diagnosis: Hemoptysis  Post-op Diagnosis: same, bleeding from LLL  Surgeon: Shan Levans  Anesthesia: Monitored Local Anesthesia with Sedation  Operation: Flexible fiberoptic bronchoscopy, diagnostic   Findings: No endobronchial lesions. Old blood/ clots in LLL.   Specimen: Bronchial washings LLL  Estimated Blood Loss: Minimal  Complications: transient hypoxemia  Indications and History: The patient is a 79 y.o. male with hemoptysis.  The risks, benefits, complications, treatment options and expected outcomes were discussed with the patient.  The possibilities of reaction to medication, pulmonary aspiration, perforation of a viscus, bleeding, failure to diagnose a condition and creating a complication requiring transfusion or operation were discussed with the patient who freely signed the consent.    Description of Procedure: The patient was re-examined in the bronchoscopy suite and the site of surgery properly noted/marked.  The patient was identified as [name] Newcomer Sr. and the procedure verified as Flexible Fiberoptic Bronchoscopy.  A Time Out was held and the above information confirmed.   After the induction of topical nasopharyngeal anesthesia, the patient was positioned  and the bronchoscope was passed through the R nares. The vocal cords were visualized and  1% buffered lidocaine 5 ml was topically placed onto the cords. The cords were normal. The scope was then passed into the trachea.  1% buffered lidocaine 5 ml was used topically on the carina.  Careful inspection of the tracheal lumen was accomplished. The scope was sequentially passed into the left main and then left upper and lower bronchi and segmental bronchi.      The scope was then withdrawn and advanced into the right main bronchus and then into the RUL,  bronchi and segmental bronchi.  No endobronchial lesions seen.  RLL/RML stump healed. No ca  seen .   Old blood and clots from LLL orifice and no endobronchial lesions seen  Endobronchial findings: Old blood clots in LLL orifice. No endobronchial lesions seen Trachea: Normal mucosa Carina: Normal mucosa Right main bronchus: Normal mucosa Right upper lobe bronchus: Normal mucosa RLL and RML stump healed, no lesions seen Left main bronchus: Normal mucosa Left upper lobe bronchus: Normal mucosa Left lower lobe bronchus: Normal mucosa, blood clots seen . No active bleeding. No endobronchial lesions seen  The Patient was taken to the Endoscopy Recovery area in satisfactory condition.  Attestation: I performed the procedure.  Luisa Hart WrightMD

## 2012-09-01 NOTE — Progress Notes (Signed)
[facility] Progress Note   [name] BEG Sr. JWJ:[id number] DOB: Apr 20, 1932 DOA: 08/27/2012 PCP: [name], MD  HPI/Subjective: Continues to have hemoptysis and concerning for patient and wife. Patient also with some SOB and some use of accessory muscles of respiration.  Brief narrative: 79 y.o. male with past medical history of chronic respiratory failure on 4 L of oxygen at home secondary to COPD, OSA, CHF, A. fib and CKD. Patient came into the hospital because of shortness of breath. He reported having shortness of breath for 4-5 days, associated with some cough, and subjective fevers. He was recently started on doxycycline for presumed cellulitis of left heel. The shortness of breath progressively worsened prompting him to seek medical care because of severe SOB. He was placed on BiPAP. In the ED his chest x-ray was negative for pneumonia but theres were small bilateral effusions and vascular congestion consistent with CHF. Labs revealed significant leukocytosis of 21,000.   Assessment/Plan:  Acute-on-chronic respiratory failure with hypoxia due to:   CHF (congestive heart failure)/? diastolic dysfunction   CHRONIC OBSTRUCTIVE PULMONARY DISEASE, SEVERE -has been weaned back to home rate of oxygen, BiPAP discontinued. -has diuresed neg -4818.9 cc since admit. -Cards managing HF and Lasix infusion -could have atypical pulmonary infection, but doubt - w -cont supportive care with nebs/MDI and IV steriods. -Levaquin added, Zosyn vancomycin was discontinued. -Patient has leukocytosis, minimal layering tray and elevated procalcitonin consistent with infection. ?? Increase lasix as CXR yesterday with pulm edema and bilateral effusions. Will defer to cardiology. Repeat CXR today. Check ABG. Continue Follow. Cardiology and pulmonary ff.  Hemoptysis -Patient was on heparin drip, in setting of mild pulmonary edema and COPD exacerbation. -Anticoagulation was discontinued. CT scan of the  chest without contrast with markedly asymmetric bilateral airspace disease with areas of consolidation in the posterior and inferior right upper lobe. A be secondary to pneumonia versus aspiration. New groundglass opacities in the left upper lobe and left lower lobe. Underlying mild pulmonary edema cannot be excluded. New 5 mm pulmonary nodule left upper lobe. Small bilateral pleural effusions. Aortic valve calcifications. Due to patient's history of lung cancer patient has been scheduled for bronchoscopy today per pulmonary. Will likely need to hold on anticoagulations secondary to hemoptysis. Pulmonary to advise when and if patient can be anticoagulated. -Cardiology is considering DCCV, patient has to be on heparin.  Per pulmonary.  CKD (chronic kidney disease), stage III -baseline is 28/1.8 in March of 2014 -Renal function worsened, however at 2.33 today. On Lasix . -Patient has significant lower extremity edema, chest x-ray shows pulmonary edema as well. Diuresis carefully monitor renal function.    Leukocytosis -Etiology unclear so will continue anbx's, likely from his COPD exacerbation -Both legs reddened but exam without pain which would be expected if had severe cellulitis given very high WBC. -Improving.  CORONARY ARTERY DISEASE (nonobstructive disease 2006 catheterization)/Elevation of cardiac enzymes -no CP and likely due to acute HF, demand ischemia from recent hypoxia and worsened renal function  HTN (hypertension) -BP controlled -cont home meds  DM (diabetes mellitus) type II controlled with renal manifestation -CBG 183-229. Patient also on IV steriods. Add Lantus 5 units daily. -cont SSI  OSA (obstructive sleep apnea) CPAP QHS  Atrial fibrillation with controlled ventricular rates - recent onset -cont BB and CCB. Per cardiology.  ? Cellulitis of left ankle -has redness both legs c/w stasis dermatitis  Hypokalemia Due to lasix - replace and follow   NSCLC s/p  RULectomy and adjuvant chemo in  2006  DVT prophylaxis: On full dose heparin infusion Code Status: Full Family Communication: Patient and wife at bedside Disposition Plan: Remains inpatient Isolation: Contact isolation for MRSA positive PCR  Consultants: Cardiology PCCM (per Cards)  Procedures: 2-D echocardiogram - Left ventricle: The cavity size was normal. Wall thickness was increased in a pattern of moderate LVH. Systolic function was vigorous. The estimated ejection fraction was in the range of 65% to 70%. Wall motion was normal; there were no regional wall motion abnormalities. - Left atrium: The atrium was mildly to moderately dilated. - Right atrium: The atrium was mildly to moderately dilated.   Antibiotics: Levaquin 6/11 Zosyn 6/12 >>>6/13 Vancomycin 6/12 >>>6/13   Objective: Blood pressure 121/78, pulse 76, temperature 97.3 F (36.3 C), temperature source Oral, resp. rate 18, height 5\' 7"  (1.702 m), weight 93.9 kg (207 lb 0.2 oz), SpO2 90.00%.  Intake/Output Summary (Last 24 hours) at 09/01/12 0810 Last data filed at 09/01/12 0645  Gross per 24 hour  Intake    883 ml  Output   2200 ml  Net  -1317 ml     Exam: General: Some use of accessory muscles of respiration. Lungs: Decreased BS in bases. No wheezing. Some use of accessory muscles of respiration. Cardiovascular: Regular rate without murmur gallop or rub normal S1 and S2, 2-3+ bilateral lower extremity peripheral edema  Abdomen: Nontender, nondistended, soft, bowel sounds positive, no rebound, no ascites, no appreciable mass Musculoskeletal: No significant cyanosis, clubbing of bilateral lower extremities Neurological: Alert and oriented x 3, moves all extremities x 4 without focal neurological deficits, CN 2-12 intact  Scheduled Meds: Scheduled Meds: . arformoterol  15 mcg Nebulization BID  . atorvastatin  40 mg Oral QHS  . budesonide (PULMICORT) nebulizer solution  0.5 mg Nebulization BID  .  citalopram  10 mg Oral Daily  . diltiazem  180 mg Oral BID  . ezetimibe  10 mg Oral Daily  . furosemide  120 mg Intravenous BID  . guaiFENesin  1,200 mg Oral BID  . hydrALAZINE  50 mg Oral BID  . insulin aspart  0-9 Units Subcutaneous TID WC  . levofloxacin  500 mg Oral Q48H  . magnesium oxide  400 mg Oral Daily  . methylPREDNISolone (SOLU-MEDROL) injection  40 mg Intravenous Q12H  . metoprolol succinate  25 mg Oral BID WC  . multivitamin-lutein  1 capsule Oral BID  . pantoprazole  40 mg Oral Daily  . sodium chloride  3 mL Intravenous Q12H  . tiotropium  18 mcg Inhalation Daily   Continuous Infusions:    Data Reviewed: Basic Metabolic Panel:  Recent Labs Lab 08/28/12 0500 08/29/12 0400 08/30/12 0450 08/31/12 0500 09/01/12 0454  NA 137 137 138 138 136  K 3.1* 3.6 3.5 3.4* 3.6  CL 97 95* 96 96 96  CO2 27 27 28 30 30   GLUCOSE 153* 146* 155* 198* 207*  BUN 42* 47* 52* 57* 70*  CREATININE 2.32* 2.44* 2.21* 2.26* 2.33*  CALCIUM 8.3* 8.5 8.6 8.9 8.9  MG 1.9 2.0  --   --   --    Liver Function Tests:  Recent Labs Lab 08/25/12 1653 08/31/12 0500  AST 23 20  ALT 25 22  ALKPHOS 76 71  BILITOT 0.8 0.4  PROT 6.2 6.2  ALBUMIN 3.1* 2.8*   CBC:  Recent Labs Lab 08/25/12 1653 08/27/12 0349 08/28/12 0500 08/29/12 0400 08/31/12 0500 09/01/12 0454  WBC 14.6* 20.9* 21.3* 19.3* 10.5 14.7*  NEUTROABS 12.6* 17.0*  --   --  9.5*  --   HGB 12.6* 12.0* 11.0* 11.5* 11.5* 11.3*  HCT 38.2* 36.1* 32.7* 33.8* 34.5* 33.9*  MCV 96.4 93.5 92.1 92.3 92.2 91.6  PLT 278.0 284 256 297 347 362   Cardiac Enzymes:  Recent Labs Lab 08/27/12 0350 08/27/12 0830 08/27/12 1405 08/27/12 1958  TROPONINI <0.30 0.81* 1.36* 1.71*   BNP (last 3 results)  Recent Labs  08/27/12 0350 08/28/12 0500 08/31/12 0500  PROBNP 3385.0* 6086.0* 7566.0*   CBG:  Recent Labs Lab 08/31/12 0749 08/31/12 1238 08/31/12 1751 08/31/12 2215 09/01/12 0748  GLUCAP 179* 201* 183* 229* 201*     Recent Results (from the past 240 hour(s))  CULTURE, BLOOD (ROUTINE X 2)     Status: None   Collection Time    08/27/12  8:30 AM      Result Value Range Status   Specimen Description BLOOD LEFT HAND   Final   Special Requests BOTTLES DRAWN AEROBIC ONLY 5CC   Final   Culture  Setup Time 08/27/2012 14:16   Final   Culture     Final   Value:        BLOOD CULTURE RECEIVED NO GROWTH TO DATE CULTURE WILL BE HELD FOR 5 DAYS BEFORE ISSUING A FINAL NEGATIVE REPORT   Report Status PENDING   Incomplete  MRSA PCR SCREENING     Status: Abnormal   Collection Time    08/27/12  8:38 AM      Result Value Range Status   MRSA by PCR POSITIVE (*) NEGATIVE Final   Comment:            The GeneXpert MRSA Assay (FDA     approved for NASAL specimens     only), is one component of a     comprehensive MRSA colonization     surveillance program. It is not     intended to diagnose MRSA     infection nor to guide or     monitor treatment for     MRSA infections.     RESULT CALLED TO, READ BACK BY AND VERIFIED WITH:     Esperanza Richters AT 1148 08/27/12 BY K BARR  CULTURE, BLOOD (ROUTINE X 2)     Status: None   Collection Time    08/27/12  8:40 AM      Result Value Range Status   Specimen Description BLOOD LEFT HAND   Final   Special Requests BOTTLES DRAWN AEROBIC ONLY 4CC   Final   Culture  Setup Time 08/27/2012 14:16   Final   Culture     Final   Value:        BLOOD CULTURE RECEIVED NO GROWTH TO DATE CULTURE WILL BE HELD FOR 5 DAYS BEFORE ISSUING A FINAL NEGATIVE REPORT   Report Status PENDING   Incomplete  URINE CULTURE     Status: None   Collection Time    08/27/12  8:28 PM      Result Value Range Status   Specimen Description URINE, CLEAN CATCH   Final   Special Requests NONE   Final   Culture  Setup Time 08/28/2012 09:51   Final   Colony Count NO GROWTH   Final   Culture NO GROWTH   Final   Report Status 08/29/2012 FINAL   Final  CULTURE, EXPECTORATED SPUTUM-ASSESSMENT     Status: None    Collection Time    08/31/12  8:38 AM      Result Value Range Status  Specimen Description SPUTUM   Final   Special Requests NONE   Final   Sputum evaluation     Final   Value: MICROSCOPIC FINDINGS SUGGEST THAT THIS SPECIMEN IS NOT REPRESENTATIVE OF LOWER RESPIRATORY SECRETIONS. PLEASE RECOLLECT.     CALLED TO Mackie Pai 08/31/12 0913 BY K SCHULTZ   Report Status 08/31/2012 FINAL   Final  CULTURE, EXPECTORATED SPUTUM-ASSESSMENT     Status: None   Collection Time    08/31/12  1:10 PM      Result Value Range Status   Specimen Description SPUTUM   Final   Special Requests NONE   Final   Sputum evaluation     Final   Value: THIS SPECIMEN IS ACCEPTABLE. RESPIRATORY CULTURE REPORT TO FOLLOW.   Report Status 08/31/2012 FINAL   Final     Studies:  Recent x-ray studies have been reviewed in detail by the Attending Physician  Scheduled Meds:  Reviewed in detail by the Attending Physician   [facility] Office  [phone] Pager [phone]  **If unable to reach the above provider after paging please contact the Flow Manager @ [phone]  On-Call/Text Page:      [email or web address]      password [email or web address]  If 7PM-7AM, please contact night-coverage [email or web address] Password [email or web address] 09/01/2012, 8:10 AM   LOS: 5 days

## 2012-09-01 NOTE — Progress Notes (Signed)
Pt placed on CPAP 14 cmH2O with FFM per home use with 4LPM oxygen bleed in. Pt is comfortable and tolerating well.

## 2012-09-02 ENCOUNTER — Encounter (HOSPITAL_COMMUNITY): Payer: Self-pay | Admitting: Critical Care Medicine

## 2012-09-02 DIAGNOSIS — I4891 Unspecified atrial fibrillation: Secondary | ICD-10-CM

## 2012-09-02 DIAGNOSIS — J156 Pneumonia due to other aerobic Gram-negative bacteria: Secondary | ICD-10-CM | POA: Diagnosis present

## 2012-09-02 DIAGNOSIS — I509 Heart failure, unspecified: Secondary | ICD-10-CM

## 2012-09-02 DIAGNOSIS — R7989 Other specified abnormal findings of blood chemistry: Secondary | ICD-10-CM

## 2012-09-02 DIAGNOSIS — J9620 Acute and chronic respiratory failure, unspecified whether with hypoxia or hypercapnia: Secondary | ICD-10-CM

## 2012-09-02 DIAGNOSIS — I5033 Acute on chronic diastolic (congestive) heart failure: Secondary | ICD-10-CM

## 2012-09-02 LAB — BASIC METABOLIC PANEL
BUN: 80 mg/dL — ABNORMAL HIGH (ref 6–23)
CO2: 29 mEq/L (ref 19–32)
Chloride: 92 mEq/L — ABNORMAL LOW (ref 96–112)
GFR calc non Af Amer: 23 mL/min — ABNORMAL LOW (ref >90)
Glucose, Bld: 249 mg/dL — ABNORMAL HIGH (ref 70–99)
Potassium: 3.7 mEq/L (ref 3.5–5.1)
Sodium: 134 mEq/L — ABNORMAL LOW (ref 135–145)

## 2012-09-02 LAB — CBC WITH DIFFERENTIAL/PLATELET
Basophils Relative: 0 % (ref 0–1)
Eosinophils Absolute: 0 10*3/uL (ref 0.0–0.7)
HCT: 35.1 % — ABNORMAL LOW (ref 39.0–52.0)
Hemoglobin: 12.1 g/dL — ABNORMAL LOW (ref 13.0–17.0)
Lymphs Abs: 0.8 10*3/uL (ref 0.7–4.0)
MCH: 31.4 pg (ref 26.0–34.0)
MCHC: 34.5 g/dL (ref 30.0–36.0)
MCV: 91.2 fL (ref 78.0–100.0)
Monocytes Absolute: 1.2 10*3/uL — ABNORMAL HIGH (ref 0.1–1.0)
Monocytes Relative: 6 % (ref 3–12)
Neutrophils Relative %: 89 % — ABNORMAL HIGH (ref 43–77)
RBC: 3.85 MIL/uL — ABNORMAL LOW (ref 4.22–5.81)

## 2012-09-02 LAB — CULTURE, BLOOD (ROUTINE X 2): Culture: NO GROWTH

## 2012-09-02 LAB — CULTURE, RESPIRATORY W GRAM STAIN: Culture: NORMAL

## 2012-09-02 LAB — GLUCOSE, CAPILLARY: Glucose-Capillary: 311 mg/dL — ABNORMAL HIGH (ref 70–99)

## 2012-09-02 MED ORDER — ALTEPLASE 100 MG IV SOLR
2.0000 mg | Freq: Once | INTRAVENOUS | Status: AC
  Administered 2012-09-02: 2 mg
  Filled 2012-09-02: qty 2

## 2012-09-02 MED ORDER — INSULIN GLARGINE 100 UNIT/ML ~~LOC~~ SOLN
10.0000 [IU] | Freq: Every day | SUBCUTANEOUS | Status: DC
  Administered 2012-09-02 – 2012-09-10 (×9): 10 [IU] via SUBCUTANEOUS
  Filled 2012-09-02 (×10): qty 0.1

## 2012-09-02 MED ORDER — PREDNISONE 20 MG PO TABS
40.0000 mg | ORAL_TABLET | Freq: Every day | ORAL | Status: DC
  Administered 2012-09-02 – 2012-09-03 (×2): 40 mg via ORAL
  Filled 2012-09-02 (×2): qty 2

## 2012-09-02 MED ORDER — ALTEPLASE 2 MG IJ SOLR
2.0000 mg | Freq: Once | INTRAMUSCULAR | Status: AC
  Administered 2012-09-02: 2 mg
  Filled 2012-09-02: qty 2

## 2012-09-02 NOTE — Progress Notes (Signed)
PATIENT DETAILS Name: [name] SANZO Sr. Age: 79 y.o. Sex: male Date of Birth: September 20, 1932 Admit Date: 08/27/2012 Admitting Physician Clydia Llano, MD [id number] [name], MD  Subjective: No further hemoptysis. No major complaints overnight.  Assessment/Plan: Acute-on-chronic respiratory failure with hypoxia due to:  Acute on chronic diastolic heart failure CHF  Acute on CHRONIC OBSTRUCTIVE PULMONARY DISEASE, SEVERE - Improved  - Good response to Lasix infusion -cont supportive care with nebs/MDI and prednisone - Continue with Levaquin  Acute diastolic heart failure - Cardiology following - Continue with Lasix infusion - Slowly improving and getting more compensated.  Atrial fibrillation - Unfortunately off anti-coagulation given hemoptysis, per Dr. Delford Field, okay to resume anticoagulation in one week time - On metoprolol and Cardizem for rate control - Cardiology planning cardioversion when anticoagulation can be resumed  Hemoptysis -? Etiology- provoked by xarelto - Seems to have resolved - Status post a bronchoscopy with no endobronchial lesions seen, only old clot seen on bronchoscopy - BAL positive for gram-negative rods-on Levaquin   CKD (chronic kidney disease), stage III - Creatinine slightly above usual baseline, however on Lasix infusion. Monitor renal function closely.  Leukocytosis - Multifactorial from possible pneumonia, on steroids as well - Monitor - On Levaquin  Hypertension - Controlled- continue with current medication  Diabetes - Increase Lantus to 10 units, continue with SSI  CORONARY ARTERY DISEASE (nonobstructive disease 2006 catheterization)/Elevation of cardiac enzymes  -no CP and likely due to acute HF, demand ischemia from recent hypoxia and worsened renal function - Cardiology following  Cellulitis of left ankle  -has redness both legs c/w stasis dermatitis- seems to be improved today  NSCLC s/p RULectomy and adjuvant chemo in 2006 - Does  have a new lung nodule-will need close followup as an outpatient  Disposition: Remain inpatient  DVT Prophylaxis:  SCD's  Code Status: Full code  Family Communication Spouse at bedside  Procedures:  None  CONSULTS:  cardiology and pulmonary/intensive care   MEDICATIONS: Scheduled Meds: . arformoterol  15 mcg Nebulization BID  . atorvastatin  40 mg Oral QHS  . budesonide (PULMICORT) nebulizer solution  0.5 mg Nebulization BID  . citalopram  10 mg Oral Daily  . diltiazem  180 mg Oral BID  . ezetimibe  10 mg Oral Daily  . furosemide  120 mg Intravenous BID  . guaiFENesin  1,200 mg Oral BID  . hydrALAZINE  50 mg Oral BID  . insulin aspart  0-9 Units Subcutaneous TID WC  . insulin glargine  5 Units Subcutaneous QHS  . levofloxacin  500 mg Oral Q48H  . magnesium oxide  400 mg Oral Daily  . metoprolol succinate  25 mg Oral BID WC  . multivitamin  1 tablet Oral Daily  . pantoprazole  40 mg Oral Daily  . predniSONE  40 mg Oral Daily  . sodium chloride  3 mL Intravenous Q12H  . tiotropium  18 mcg Inhalation Daily   Continuous Infusions:  PRN Meds:.ALPRAZolam, levalbuterol, morphine injection, nitroGLYCERIN, ondansetron (ZOFRAN) IV, ondansetron, oxyCODONE, polyvinyl alcohol, sodium chloride, traZODone  Antibiotics: Anti-infectives   Start     Dose/Rate Route Frequency Ordered Stop   08/29/12 1200  levofloxacin (LEVAQUIN) tablet 500 mg     500 mg Oral Every 48 hours 08/29/12 0957     08/27/12 0900  vancomycin (VANCOCIN) 1,250 mg in sodium chloride 0.9 % 250 mL IVPB  Status:  Discontinued     1,250 mg 166.7 mL/hr over 90 Minutes Intravenous Every 24 hours 08/27/12 0826 08/28/12 1809  08/27/12 0900  piperacillin-tazobactam (ZOSYN) IVPB 3.375 g  Status:  Discontinued     3.375 g 12.5 mL/hr over 240 Minutes Intravenous 3 times per day 08/27/12 0826 08/28/12 1809   08/27/12 0600  levofloxacin (LEVAQUIN) IVPB 500 mg     500 mg 100 mL/hr over 60 Minutes Intravenous  Once  08/27/12 0554 08/27/12 0735       PHYSICAL EXAM: Vital signs in last 24 hours: Filed Vitals:   09/01/12 2240 09/02/12 0433 09/02/12 0752 09/02/12 1533  BP: 168/94 146/87  148/76  Pulse: 92 87  82  Temp:  97.5 F (36.4 C)  97.9 F (36.6 C)  TempSrc:  Oral  Oral  Resp: 20 22  20   Height:      Weight:      SpO2: 95% 94% 95% 94%    Weight change:  Filed Weights   08/28/12 0439 08/29/12 0630 08/31/12 0641  Weight: 94 kg (207 lb 3.7 oz) 94.1 kg (207 lb 7.3 oz) 93.9 kg (207 lb 0.2 oz)   Body mass index is 32.42 kg/(m^2).   Gen Exam: Awake and alert with clear speech.   Neck: Supple, No JVD.   Chest: B/L Clear.   CVS: S1 S2 Regular, no murmurs.  Abdomen: soft, BS +, non tender, non distended.  Extremities: 1-2 + edema, lower extremities warm to touch. Neurologic: Non Focal.   Skin: No Rash.   Wounds: N/A.   Intake/Output from previous day:  Intake/Output Summary (Last 24 hours) at 09/02/12 1540 Last data filed at 09/02/12 5784  Gross per 24 hour  Intake    200 ml  Output   1800 ml  Net  -1600 ml     LAB RESULTS: CBC  Recent Labs Lab 08/27/12 0349 08/28/12 0500 08/29/12 0400 08/31/12 0500 09/01/12 0454 09/02/12 1245  WBC 20.9* 21.3* 19.3* 10.5 14.7* 18.5*  HGB 12.0* 11.0* 11.5* 11.5* 11.3* 12.1*  HCT 36.1* 32.7* 33.8* 34.5* 33.9* 35.1*  PLT 284 256 297 347 362 369  MCV 93.5 92.1 92.3 92.2 91.6 91.2  MCH 31.1 31.0 31.4 30.7 30.5 31.4  MCHC 33.2 33.6 34.0 33.3 33.3 34.5  RDW 16.1* 15.9* 16.1* 15.8* 15.8* 15.5  LYMPHSABS 1.7  --   --  0.8  --  0.8  MONOABS 2.2*  --   --  0.2  --  1.2*  EOSABS 0.0  --   --  0.0  --  0.0  BASOSABS 0.0  --   --  0.0  --  0.0    Chemistries   Recent Labs Lab 08/28/12 0500 08/29/12 0400 08/30/12 0450 08/31/12 0500 09/01/12 0454 09/02/12 1245  NA 137 137 138 138 136 134*  K 3.1* 3.6 3.5 3.4* 3.6 3.7  CL 97 95* 96 96 96 92*  CO2 27 27 28 30 30 29   GLUCOSE 153* 146* 155* 198* 207* 249*  BUN 42* 47* 52* 57* 70* 80*   CREATININE 2.32* 2.44* 2.21* 2.26* 2.33* 2.54*  CALCIUM 8.3* 8.5 8.6 8.9 8.9 8.5  MG 1.9 2.0  --   --   --   --     CBG:  Recent Labs Lab 09/01/12 1200 09/01/12 1706 09/01/12 2128 09/02/12 0804 09/02/12 1144  GLUCAP 216* 311* 234* 191* 268*    GFR Estimated Creatinine Clearance: 26.6 ml/min (by C-G formula based on Cr of 2.54).  Coagulation profile  Recent Labs Lab 08/27/12 1438  INR 1.04    Cardiac Enzymes  Recent Labs Lab 08/27/12 0830 08/27/12  1405 08/27/12 1958  TROPONINI 0.81* 1.36* 1.71*    No components found with this basename: POCBNP,  No results found for this basename: DDIMER,  in the last 72 hours No results found for this basename: HGBA1C,  in the last 72 hours No results found for this basename: CHOL, HDL, LDLCALC, TRIG, CHOLHDL, LDLDIRECT,  in the last 72 hours No results found for this basename: TSH, T4TOTAL, FREET3, T3FREE, THYROIDAB,  in the last 72 hours No results found for this basename: VITAMINB12, FOLATE, FERRITIN, TIBC, IRON, RETICCTPCT,  in the last 72 hours No results found for this basename: LIPASE, AMYLASE,  in the last 72 hours  Urine Studies No results found for this basename: UACOL, UAPR, USPG, UPH, UTP, UGL, UKET, UBIL, UHGB, UNIT, UROB, ULEU, UEPI, UWBC, URBC, UBAC, CAST, CRYS, UCOM, BILUA,  in the last 72 hours  MICROBIOLOGY: Recent Results (from the past 240 hour(s))  CULTURE, BLOOD (ROUTINE X 2)     Status: None   Collection Time    08/27/12  8:30 AM      Result Value Range Status   Specimen Description BLOOD LEFT HAND   Final   Special Requests BOTTLES DRAWN AEROBIC ONLY 5CC   Final   Culture  Setup Time 08/27/2012 14:16   Final   Culture NO GROWTH 5 DAYS   Final   Report Status 09/02/2012 FINAL   Final  MRSA PCR SCREENING     Status: Abnormal   Collection Time    08/27/12  8:38 AM      Result Value Range Status   MRSA by PCR POSITIVE (*) NEGATIVE Final   Comment:            The GeneXpert MRSA Assay (FDA      approved for NASAL specimens     only), is one component of a     comprehensive MRSA colonization     surveillance program. It is not     intended to diagnose MRSA     infection nor to guide or     monitor treatment for     MRSA infections.     RESULT CALLED TO, READ BACK BY AND VERIFIED WITH:     Esperanza Richters AT 1148 08/27/12 BY K BARR  CULTURE, BLOOD (ROUTINE X 2)     Status: None   Collection Time    08/27/12  8:40 AM      Result Value Range Status   Specimen Description BLOOD LEFT HAND   Final   Special Requests BOTTLES DRAWN AEROBIC ONLY 4CC   Final   Culture  Setup Time 08/27/2012 14:16   Final   Culture NO GROWTH 5 DAYS   Final   Report Status 09/02/2012 FINAL   Final  URINE CULTURE     Status: None   Collection Time    08/27/12  8:28 PM      Result Value Range Status   Specimen Description URINE, CLEAN CATCH   Final   Special Requests NONE   Final   Culture  Setup Time 08/28/2012 09:51   Final   Colony Count NO GROWTH   Final   Culture NO GROWTH   Final   Report Status 08/29/2012 FINAL   Final  CULTURE, EXPECTORATED SPUTUM-ASSESSMENT     Status: None   Collection Time    08/31/12  8:38 AM      Result Value Range Status   Specimen Description SPUTUM   Final   Special Requests NONE  Final   Sputum evaluation     Final   Value: MICROSCOPIC FINDINGS SUGGEST THAT THIS SPECIMEN IS NOT REPRESENTATIVE OF LOWER RESPIRATORY SECRETIONS. PLEASE RECOLLECT.     CALLED TO Mackie Pai 08/31/12 0913 BY K SCHULTZ   Report Status 08/31/2012 FINAL   Final  CULTURE, EXPECTORATED SPUTUM-ASSESSMENT     Status: None   Collection Time    08/31/12  1:10 PM      Result Value Range Status   Specimen Description SPUTUM   Final   Special Requests NONE   Final   Sputum evaluation     Final   Value: THIS SPECIMEN IS ACCEPTABLE. RESPIRATORY CULTURE REPORT TO FOLLOW.   Report Status 08/31/2012 FINAL   Final  CULTURE, RESPIRATORY (NON-EXPECTORATED)     Status: None   Collection Time     08/31/12  1:10 PM      Result Value Range Status   Specimen Description SPUTUM   Final   Special Requests NONE   Final   Gram Stain     Final   Value: RARE WBC PRESENT, PREDOMINANTLY PMN     RARE SQUAMOUS EPITHELIAL CELLS PRESENT     NO ORGANISMS SEEN   Culture NORMAL OROPHARYNGEAL FLORA   Final   Report Status 09/02/2012 FINAL   Final  AFB CULTURE WITH SMEAR     Status: None   Collection Time    09/01/12 11:19 AM      Result Value Range Status   Specimen Description BRONCHIAL WASHINGS   Final   Special Requests NONE   Final   ACID FAST SMEAR NO ACID FAST BACILLI SEEN   Final   Culture     Final   Value: CULTURE WILL BE EXAMINED FOR 6 WEEKS BEFORE ISSUING A FINAL REPORT   Report Status PENDING   Incomplete  LEGIONELLA CULTURE     Status: None   Collection Time    09/01/12 11:19 AM      Result Value Range Status   Specimen Description BRONCHIAL WASHINGS   Final   Special Requests NONE   Final   Culture     Final   Value: NO LEGIONELLA ISOLATED, CULTURE IN PROGRESS FOR 5 DAYS   Report Status PENDING   Incomplete  CULTURE, RESPIRATORY (NON-EXPECTORATED)     Status: None   Collection Time    09/01/12 11:19 AM      Result Value Range Status   Specimen Description BRONCHIAL WASHINGS   Final   Special Requests NONE   Final   Gram Stain     Final   Value: RARE WBC PRESENT, PREDOMINANTLY PMN     RARE SQUAMOUS EPITHELIAL CELLS PRESENT     RARE YEAST     RARE GRAM NEGATIVE RODS   Culture Culture reincubated for better growth   Final   Report Status PENDING   Incomplete    RADIOLOGY STUDIES/RESULTS: Dg Chest 2 View  09/01/2012   *RADIOLOGY REPORT*  Clinical Data: Shortness of breath.  CHEST - 2 VIEW  Comparison: 08/31/2012.  Findings: The left subclavian Port-A-Cath tip it is most likely in the mid SVC at the level of the carina.  Stable pacer wires.  The heart is enlarged but unchanged.  Persistent bilateral infiltrates and effusions.  IMPRESSION:  1.  The Port-A-Cath tip appears  to be in the mid SVC. 2.  Persistent bilateral infiltrates and small effusions.   Original Report Authenticated By: Rudie Meyer, [name]   Ct Chest Wo Contrast  08/30/2012   *RADIOLOGY REPORT*  Clinical Data: Hemoptysis.  Pneumonia versus edema.  CT CHEST WITHOUT CONTRAST  Technique:  Multidetector CT imaging of the chest was performed following the standard protocol without IV contrast.  Comparison: Chest radiograph 08/30/2012 and chest CT 02/05/2012  Findings: Heart is enlarged.  Cardiomegaly appears stable. Stable prominent fat density, consistent with lipoma, between the left and right atria.  Dual lead pacer is present.  There is heavy coronary artery atherosclerotic calcification of all three coronary arteries.  Aortic valvular calcifications are present.  A left IJ central venous catheter terminates in the superior vena cava. Thyroid gland and thoracic inlet appear within normal limits.  Heavy atherosclerotic calcification of the thoracic aorta, without aneurysm.  Several mediastinal lymph nodes are visualized, and within normal limits for size.  No pathologically enlarged lymph nodes are seen in the thorax.  Evaluation for the hilar lymph nodes is somewhat limited without intravenous contrast.  Small bilateral pleural effusions are present.  Multilevel degenerative changes of the thoracic spine.  Multiple benign hemangiomas in the thoracic spine vertebral bodies.  No acute or suspicious osseous abnormality.  No acute findings are identified in the abdomen.  Surgical clips are seen in the left upper quadrant.  There are postsurgical changes of the right lower lobectomy.  There is a background of centrilobular emphysema.  Stable scarring in the right upper lobe anteriorly.  No definite intralobular septal thickening.  There is markedly asymmetric airspace disease in the inferior and posterior aspect of the right upper lobe, where there are areas of consolidation.  There are ground-glass opacities which are  new compared to prior chest CT in the left upper lobe, left lower lobe, and right middle lobe.  5 mm pulmonary nodule in the left upper lobe peripherally on image #27 of the lung windows is new.  IMPRESSION:  1.  Markedly asymmetric bilateral airspace disease.  Airspace disease is most prominent, with areas of consolidation, in the posterior and inferior right upper lobe ( the patient has a history of right lower lobectomy).  Findings in the right upper lobe are favored to be due to pneumonia or aspiration. 2.  New ground-glass opacities in the left upper lobe and left lower lobe may be due to early infection.  Underlying mild pulmonary edema cannot be excluded in this patient with cardiomegaly and small bilateral pleural effusions. 3.  New 5 mm pulmonary nodule left upper lobe. If the patient is at high risk for bronchogenic carcinoma, follow-up chest CT at 6-12 months is recommended.  If the patient is at low risk for bronchogenic carcinoma, follow-up chest CT at 12 months is recommended.  This recommendation follows the consensus statement: Guidelines for Management of Small Pulmonary Nodules Detected on CT Scans: A Statement from the Fleischner Society as published in Radiology 2005; 237:395-400. 4.  Cardiomegaly with three-vessel coronary artery disease and dual lead cardiac pacer. 5.  Small bilateral pleural effusions 6.  Aortic valve calcifications. This finding raises the possibility of aortic valvular stenosis.  .   Original Report Authenticated By: Britta Mccreedy, [name]   Dg Chest Port 1 View  08/31/2012   *RADIOLOGY REPORT*  Clinical Data: Follow-up edema  PORTABLE CHEST - 1 VIEW  Comparison: Yesterday  Findings: The tip of the left subclavian Port-A-Cath has changed its orientation.  Based on a single view, it is likely in the azygos vein.  Right subclavian dual lead pacemaker device and leads are stable and intact.  Bilateral pleural effusions left greater  than right are stable.  Diffuse edema is  stable. Cardiomegaly.  IMPRESSION: Left subclavian Port-A-Cath tip has changed its position and is likely in the azygos vein.  This can be confirmed with a lateral view.  Stable airspace disease and pleural effusions.   Original Report Authenticated By: Jolaine Click, [name]   Dg Chest Port 1 View  08/30/2012   *RADIOLOGY REPORT*  Clinical Data: Respiratory failure.  PORTABLE CHEST - 1 VIEW  Comparison: 08/29/2012 and 08/27/2012  Findings: Prominent interstitial markings could represent underlying edema.  There are persistent interstitial and airspace densities in the right mid and lower lung region.  Heart size remains enlarged.  Again noted is a right dual lead cardiac pacemaker.  Left subclavian Port-A-Cath in the SVC region.  Aortic arch is heavily calcified.  IMPRESSION:  Prominent interstitial markings may represent underlying edema. Again noted are increased densities in the right mid and lower lung region which could represent asymmetric edema but cannot exclude an infectious etiology.   Original Report Authenticated By: Richarda Overlie, [name]   Dg Chest Port 1 View  08/29/2012   *RADIOLOGY REPORT*  Clinical Data: Follow-up CHF  PORTABLE CHEST - 1 VIEW  Comparison: Prior chest x-ray 08/27/2012  Findings: Stable position of left subclavian central venous catheter with the tip in the mid superior vena cava.  Right subclavian approach cardiac rhythm maintenance device with leads in the right atrium and right ventricle.  Slightly improved inspiratory volumes with clearing in the right base.  Stable elevation of the left hemidiaphragm resulting in blunting of the left costophrenic angle.  Decreasing interstitial edema with the residual asymmetric patchy opacity in the right mid and lower lobe. Small residual right pleural effusion.  Stable cardiomegaly. Aortic atherosclerosis again noted.  No pneumothorax.  IMPRESSION:  1.  Decreasing pulmonary edema with improved inspiratory volumes and clearing in the right base. 2.   Residual asymmetric patchy opacity in the right mid and lower lung may reflect residual asymmetric alveolar edema, or pneumonia. 3.  Persistent small right pleural effusion, chronic elevation of the left hemidiaphragm and cardiomegaly.   Original Report Authenticated By: Malachy Moan, [name]   Dg Chest Portable 1 View  08/27/2012   *RADIOLOGY REPORT*  Clinical Data: Shortness of breath.  History of lung cancer post lobectomy.  PORTABLE CHEST - 1 VIEW  Comparison: 02/02/2012  Findings: Shallow inspiration.  Cardiac enlargement with pulmonary vascular congestion and perihilar edema.  Bilateral pleural effusions.  Atelectasis in the lung bases.  No pneumothorax. Changes have developed since the previous study.  Postoperative changes in the right chest.  Stable appearance of cardiac pacemaker and left central venous catheter.  Calcified and tortuous aorta.  IMPRESSION: Interval development of cardiac enlargement, pulmonary vascular congestion, perihilar edema, and bilateral pleural effusions.   Original Report Authenticated By: Burman Nieves, [name]    Jeoffrey Massed, MD  [facility] Pager:[phone]  If 7PM-7AM, please contact night-coverage [email or web address] Password [email or web address] 09/02/2012, 3:40 PM   LOS: 6 days

## 2012-09-02 NOTE — Progress Notes (Signed)
Patient: [name] HOLLIMAN Sr. Date of Encounter: 09/02/2012, 10:18 AM Admit date: 08/27/2012     Subjective  Admitted with respiratory failure and HF with severe O2 dependent COPD and CRF. Has nonobstructive CAD and AFib with RVR. On medical therapy for rate control with diuresis. Has had hemoptysis and is s/p bronchoscopy yesterday. He denies CP or palpitations. SOB is about the same.   Objective  Physical Exam: Vitals: BP 146/87  Pulse 87  Temp(Src) 97.5 F (36.4 C) (Oral)  Resp 22  Ht 5\' 7"  (1.702 m)  Wt 207 lb 0.2 oz (93.9 kg)  BMI 32.42 kg/m2  SpO2 95% General: Well developed, chronically ill appearing 79 year old male in no acute distress. Neck: Supple. JVD not elevated. Lungs: Diminished breath sounds throughout but clear bilaterally to auscultation without wheezes, rales, or rhonchi. Breathing is unlabored. Heart: Irregular S1 S2 without murmurs, rubs, or gallops.  Abdomen: Soft, non-distended. Extremities: No clubbing or cyanosis. 2+ asymmetric  edema.  Distal pedal pulses are 2+ and equal bilaterally. Multiple ecchymoses forearms bilaterally and skin tear left forearm. Neuro: Alert and oriented X 3. Moves all extremities spontaneously. No focal deficits.  Intake/Output:  Intake/Output Summary (Last 24 hours) at 09/02/12 1018 Last data filed at 09/02/12 6213  Gross per 24 hour  Intake    200 ml  Output   2800 ml  Net  -2600 ml    Inpatient Medications:  . arformoterol  15 mcg Nebulization BID  . atorvastatin  40 mg Oral QHS  . budesonide (PULMICORT) nebulizer solution  0.5 mg Nebulization BID  . citalopram  10 mg Oral Daily  . diltiazem  180 mg Oral BID  . ezetimibe  10 mg Oral Daily  . furosemide  120 mg Intravenous BID  . guaiFENesin  1,200 mg Oral BID  . hydrALAZINE  50 mg Oral BID  . insulin aspart  0-9 Units Subcutaneous TID WC  . insulin glargine  5 Units Subcutaneous QHS  . levofloxacin  500 mg Oral Q48H  . magnesium oxide  400 mg Oral Daily  .  metoprolol succinate  25 mg Oral BID WC  . multivitamin  1 tablet Oral Daily  . pantoprazole  40 mg Oral Daily  . predniSONE  40 mg Oral Daily  . sodium chloride  3 mL Intravenous Q12H  . tiotropium  18 mcg Inhalation Daily    Labs:  Recent Labs  08/31/12 0500 09/01/12 0454  NA 138 136  K 3.4* 3.6  CL 96 96  CO2 30 30  GLUCOSE 198* 207*  BUN 57* 70*  CREATININE 2.26* 2.33*  CALCIUM 8.9 8.9    Recent Labs  08/31/12 0500  AST 20  ALT 22  ALKPHOS 71  BILITOT 0.4  PROT 6.2  ALBUMIN 2.8*    Recent Labs  08/31/12 0500 09/01/12 0454  WBC 10.5 14.7*  NEUTROABS 9.5*  --   HGB 11.5* 11.3*  HCT 34.5* 33.9*  MCV 92.2 91.6  PLT 347 362    Echocardiogram: Study Conclusions - Left ventricle: The cavity size was normal. Wall thickness was increased in a pattern of moderate LVH. Systolic function was vigorous. The estimated ejection fraction was in the range of 65% to 70%. Wall motion was normal; there were no regional wall motion abnormalities. - Left atrium: The atrium was mildly to moderately dilated. 60 mm. - Right atrium: The atrium was mildly to moderately dilated.   Telemetry: AF in the 90s; PVCs  Assessment and Plan  1. Acute on chronic respiratory failure 2. Acute on chronic diastolic HF 3. Elevated troponin with h/o nonobstructive CAD - likely due to demand ischemia  4. Paroxysmal atrial fibrillation - has been persistent since June 27, 2012 by device interrogation; [name] Stevenson and his wife report increased SOB x 1 month which correlates to onset of AF which has been persistent; LA size 60 mm; rate controlled; off anticoagulation currently 5. Hypertension  6. COPD with hemoptysis s/p bronchoscopy yesterday; per Pulmonary 9. CKD - worsening  Dr. Graciela Husbands to see Signed, Rick Duff PA-C    spoke w Dr Timoteo Ace  He thinks it will be ok to resume anticoagulation in about 1 week  We will then plan tee-dccv  I will begin amio also in the hopes of trying to  maintain sinus, realizing there is a risk of pulmonary toxicity but with large LA and HFpEF i thnk it will be hard to maintain sinus and its presumed benefits  Will continue IV lasix until Cr bumps  Reviewed with IM

## 2012-09-02 NOTE — Progress Notes (Signed)
Inpatient Diabetes Program Recommendations  AACE/ADA: New Consensus Statement on Inpatient Glycemic Control (2013)  Target Ranges:  Prepandial:   less than 140 mg/dL      Peak postprandial:   less than 180 mg/dL (1-2 hours)      Critically ill patients:  140 - 180 mg/dL   Reason for Visit: Hyperglycemia Results for [name] Stevenson, [name] SR. (MRN [id number]) as of 09/02/2012 09:33  Ref. Range 09/01/2012 04:54  Sodium Latest Range: 136-145 mEq/L 136  Potassium Latest Range: 3.5-5.1 mEq/L 3.6  Chloride Latest Range: 96-112 mEq/L 96  CO2 Latest Range: 19-32 mEq/L 30  BUN Latest Range: 7.0-26.0 mg/dL 70 (H)  Creatinine Latest Range: 0.50-1.35 mg/dL 4.09 (H)  Calcium Latest Range: 8.4-10.5 mg/dL 8.9  GFR calc non Af Amer Latest Range: >90 mL/min 25 (L)  GFR calc Af Amer Latest Range: >90 mL/min 29 (L)  Glucose Latest Range: 70-99 mg/dl 811 (H)     Inpatient Diabetes Program Recommendations Insulin - Basal: Increase Lantus to 8 units QHS Insulin - Meal Coverage: May need small amt meal coverage when appetite improves and still on steroids

## 2012-09-02 NOTE — Progress Notes (Signed)
Occupational Therapy Evaluation Patient Details Name: [name] Stevenson. MRN: [id number] DOB: 1933-03-06 Today's Date: 09/02/2012 Time: 7829-5621 OT Time Calculation (min): 16 min  OT Assessment / Plan / Recommendation Clinical Impression  Patient presents to OT with decreased ADL independence and safety after being admitted with acute on chronic respiratory failure. Patient is on 4LO2 at home and has necessary DME. OT will follow acutely to facilitate safe d/c home with his wife.     OT Assessment  Patient needs continued OT Services    Follow Up Recommendations  No OT follow up;Supervision/Assistance - 24 hour    Barriers to Discharge None    Equipment Recommendations  None recommended by OT;Other (comment) (pt has all needed dme)    Recommendations for Other Services    Frequency  Min 2X/week    Precautions / Restrictions Precautions Precautions: Fall Restrictions Weight Bearing Restrictions: No   Pertinent Vitals/Pain     ADL  Eating/Feeding: Performed;Independent Where Assessed - Eating/Feeding: Chair Grooming: Set up Where Assessed - Grooming: Supported sitting Toilet Transfer: Simulated;Min Pension scheme manager Method: Sit to Barista: Regular height toilet Toileting - Clothing Manipulation and Hygiene: Simulated;Min guard Where Assessed - Engineer, mining and Hygiene: Standing Equipment Used: Rolling walker Transfers/Ambulation Related to ADLs: Patient received up in recliner. Sit to stand and stand to sit min guard A. ADL Comments: Patient fatigues easily. Began education on energy conservation techniques.    OT Diagnosis: Generalized weakness  OT Problem List: Decreased activity tolerance;Cardiopulmonary status limiting activity OT Treatment Interventions: Self-care/ADL training;Energy conservation;DME and/or AE instruction;Therapeutic activities;Patient/family education   OT Goals Acute Rehab OT Goals OT Goal  Formulation: With patient Time For Goal Achievement: 09/16/12 Potential to Achieve Goals: Good ADL Goals Pt Will Perform Upper Body Bathing: with modified independence;Sitting, chair ADL Goal: Upper Body Bathing - Progress: Goal set today Pt Will Perform Lower Body Bathing: with modified independence;Sit to stand from chair ADL Goal: Lower Body Bathing - Progress: Goal set today Pt Will Perform Upper Body Dressing: with modified independence;Sitting, chair;Sitting, bed ADL Goal: Upper Body Dressing - Progress: Goal set today Pt Will Perform Lower Body Dressing: with modified independence;Sit to stand from chair;Sit to stand from bed ADL Goal: Lower Body Dressing - Progress: Goal set today Pt Will Transfer to Toilet: with modified independence ADL Goal: Toilet Transfer - Progress: Goal set today Pt Will Perform Toileting - Clothing Manipulation: Independently ADL Goal: Toileting - Clothing Manipulation - Progress: Goal set today Pt Will Perform Toileting - Hygiene: Independently ADL Goal: Toileting - Hygiene - Progress: Goal set today Additional ADL Goal #1: Patient will be independent with energy conservation techniques for ADLS. ADL Goal: Additional Goal #1 - Progress: Goal set today  Visit Information  Last OT Received On: 09/02/12 Assistance Needed: +1    Subjective Data  Subjective: "I've had a relaxing day" Patient Stated Goal: to go home   Prior Functioning     Home Living Lives With: Spouse Available Help at Discharge: Family Type of Home: House Home Access: Level entry Home Layout: Two level Bathroom Shower/Tub: Health visitor: Handicapped height Home Adaptive Equipment: Walker - rolling;Shower chair with back Additional Comments: chair lift Prior Function Level of Independence: Independent with assistive device(s) Vocation: Retired Musician: HOH         Vision/Perception     Copywriter, advertising Arousal/Alertness:  Awake/alert Behavior During Therapy: WFL for tasks assessed/performed Overall Cognitive Status: Within Functional Limits for tasks assessed  Extremity/Trunk Assessment Right Upper Extremity Assessment RUE ROM/Strength/Tone: [facility] for tasks assessed Left Upper Extremity Assessment LUE ROM/Strength/Tone: WFL for tasks assessed     End of Session OT - End of Session Activity Tolerance: Patient tolerated treatment well;Patient limited by fatigue Patient left: in chair;with call bell/phone within reach;with family/visitor present  GO     Orhan Mayorga A 09/02/2012, 6:09 PM

## 2012-09-02 NOTE — Progress Notes (Signed)
Pt. Was placed on CPAP of 14cm H2O via FFM with 4L O2 bled in. Pt. Is tolerating CPAP well at this time without any complications.

## 2012-09-02 NOTE — Progress Notes (Signed)
PULMONARY  / CRITICAL CARE MEDICINE  Name: [name] ELLENA Sr. MRN: [id number] DOB: 02/01/1933    ADMISSION DATE:  08/27/2012 CONSULTATION DATE:  08/28/2012  REFERRING MD :  Berton Mount PRIMARY SERVICE:  [facility]  CHIEF COMPLAINT:  Short of breath  BRIEF PATIENT DESCRIPTION:  79 yo male former smoker presented with severe dyspnea from acute pulmonary edema, acute diastolic dysfunction, and new onset A fib with RVR.  He is followed by Dr. [name] for severe COPD, chronic respiratory failure on home oxygen, OSA on BiPAP, and NSCLC s/p RULectomy and adjuvant chemo in 2006.  PCCM consulted to assist with respiratory management, and to assist determination whether he is candidate for cardioversion.  He was recently treated as outpt for Lt ankle cellulitis.  SIGNIFICANT EVENTS: 6/12> Admit with A fib RVR, acute diastolic heart failure 6/14> Hemoptysis on Xarelto=> stopped, placed on Levaquin/ Solumedrol. 6/15> wife reports similar lumps of bloody phlegm thru the night; seems diminished this AM, persist rhonchi/congestion.  STUDIES:  6/12 Echo >> mod LVH, EF 65 to 70%, mild/mod LA and RA dilation 615 CT chest>>>Markedly asymmetric bilateral airspace disease. Airspace disease is most prominent, with areas of consolidation, in the posterior and inferior right upper lobe ( the patient has a history of right lower lobectomy). Findings in the right upper lobe are favored to be due to pneumonia or aspiration. 2. New ground-glass opacities in the left upper lobe and left lower lobe may be due to early infection. Underlying mild pulmonary edema cannot be excluded in this patient with cardiomegaly and small bilateral pleural effusions. 3. New 5 mm pulmonary nodule left upper lobe. 4. Cardiomegaly with three-vessel coronary artery disease and dual lead cardiac pacer. 5. Small bilateral pleural effusions 6. Aortic valve calcifications. This finding raises the possibility of aortic valvular stenosis.   LINES /  TUBES: PIV  CULTURES: Blood 6/12 >> MRSA screen 6/12 >> POSITIVE BAL 6/17>>> rare GNR, rare yeast>>>  ANTIBIOTICS: Doxycycline 6/10 >> 6/12 Vancomycin 6/12 >>6/14 Zosyn 6/12 >> 6/14 Levaquin 6/14>>>  Subjective/Overnight:  No further hemoptysis.  Feeling some better.  Remains more SOB than normal but improving.     VITAL SIGNS: Temp:  [97 F (36.1 C)-97.5 F (36.4 C)] 97.5 F (36.4 C) (06/18 0433) Pulse Rate:  [86-92] 87 (06/18 0433) Resp:  [18-46] 22 (06/18 0433) BP: (110-168)/(76-100) 146/87 mmHg (06/18 0433) SpO2:  [86 %-96 %] 95 % (06/18 0752) 4 liters [place]  PHYSICAL EXAMINATION: General:  Chronically ill appearing male, No distress, speaks in full sentences, using accessory muscles Neuro:  Alert, follows commands, normal strength, seems anxious HEENT:  Mm moist, no JVD Cardiovascular:  Irregular, tachycardic, no murmur Lungs:  Decreased breath sounds, basilar rales, exp wheeze R>L but improved Abdomen:  Soft, non tender, + bowel sounds Musculoskeletal:  2+ leg edema R>L    Recent Labs Lab 08/30/12 0450 08/31/12 0500 09/01/12 0454  NA 138 138 136  K 3.5 3.4* 3.6  CL 96 96 96  CO2 28 30 30   BUN 52* 57* 70*  CREATININE 2.21* 2.26* 2.33*  GLUCOSE 155* 198* 207*    Recent Labs Lab 08/29/12 0400 08/31/12 0500 09/01/12 0454  HGB 11.5* 11.5* 11.3*  HCT 33.8* 34.5* 33.9*  WBC 19.3* 10.5 14.7*  PLT 297 347 362   Dg Chest 2 View  09/01/2012   *RADIOLOGY REPORT*  Clinical Data: Shortness of breath.  CHEST - 2 VIEW  Comparison: 08/31/2012.  Findings: The left subclavian Port-A-Cath tip it is most likely in  the mid SVC at the level of the carina.  Stable pacer wires.  The heart is enlarged but unchanged.  Persistent bilateral infiltrates and effusions.  IMPRESSION:  1.  The Port-A-Cath tip appears to be in the mid SVC. 2.  Persistent bilateral infiltrates and small effusions.   Original Report Authenticated By: Rudie Meyer, [name]    BNP (last 3  results)  Recent Labs  08/27/12 0350 08/28/12 0500 08/31/12 0500  PROBNP 3385.0* 6086.0* 7566.0*     ASSESSMENT / PLAN:  Hemoptysis in setting of COPD exac and anticoagulation for new AFib. ?r/t new pulm nodule as below.  Much improved. No hemoptysis since 6/17.   Plan: -Anticoag (Xarelto) stopped for now -- ??when to resume with continued AFib. Poss consider apixaban when restart anticoags -continue COPD Rx as below... -cont po levaquin for now >>f/u fob cults.  GNR.on gram stain -f/u CXR & labs   New LUL 5mm pulm nodule.  No Endobronchial lesion seen on FOB. (Previously followed by Dr Welton Flakes for oncology) PLAN: -?further w/u new pulmonary nodule    -If this is recurrence of cancer may need to discuss goals of care given his severe deconditioning, new Afib, renal failure, etc.   Acute on chronic respiratory failure 2nd to acute pulmonary edema from acute diastolic heart failure and new onset A fib with RVR.  This in setting of severe COPD and OSA.  Now c/b hemoptysis. Pneumonia seems less likely. Plan: -Cont oxygen with goal SpO2 > 90% - change solumedrol to PO pred and taper slowly  -BiPAP qhs and prn during the day -negative fluid balance per cardiology and primary team - caution with renal insuff.  -continue GFN 1200mg  Bid -cont brovana/pulmicort nebulizer BID with prn xopenex nebulizer >> d/c dulera while on this regimen -cont cautious diuresis per cards    Lt ankle cellulitis. Plan: -Abx per primary team - currently on levaquin only    Renal Insuffic:  Cr=2.44 Plan: -per primary team Likely the presence of Cr 2.44 aggravated bleed risk with xarelto use   Insomnia. Plan: -prn xanax, trazodone   WHITEHEART,KATHRYN, NP 09/02/2012  9:27 AM Pager: [phone] or [phone]  I have seen and examined this pt with the NP and agree with the above note Caryl Bis  [phone]  Cell  [phone]  If no response or cell goes to voicemail, call  beeper [phone]

## 2012-09-02 NOTE — Progress Notes (Signed)
NUTRITION FOLLOW UP  Intervention:   No nutrition interventions at this time  Nutrition Dx:   Inadequate oral intake resolved  Goal:   PO intake to meet >/=90% estimated nutrition needs. Met  Monitor:   PO intake, weight trends, labs   Assessment:   Eating well, no questions or concerns with diet. Weight is stable.  S/p bronchoscopy on 6/17. Planned for TEE- DCCV and starting amio.   Height: Ht Readings from Last 1 Encounters:  08/27/12 5\' 7"  (1.702 m)    Weight Status:   Wt Readings from Last 1 Encounters:  08/31/12 207 lb 0.2 oz (93.9 kg)    Re-estimated needs:  Kcal: 2050-2350  Protein: 93-112g  Fluid: ~2.0 L/day  Skin: skin tear L arm, edema noted in U and L extremities   Diet Order: Carb Control   Intake/Output Summary (Last 24 hours) at 09/02/12 1144 Last data filed at 09/02/12 1610  Gross per 24 hour  Intake    200 ml  Output   2300 ml  Net  -2100 ml    Last BM: 6/15   Labs:   Recent Labs Lab 08/28/12 0500 08/29/12 0400 08/30/12 0450 08/31/12 0500 09/01/12 0454  NA 137 137 138 138 136  K 3.1* 3.6 3.5 3.4* 3.6  CL 97 95* 96 96 96  CO2 27 27 28 30 30   BUN 42* 47* 52* 57* 70*  CREATININE 2.32* 2.44* 2.21* 2.26* 2.33*  CALCIUM 8.3* 8.5 8.6 8.9 8.9  MG 1.9 2.0  --   --   --   GLUCOSE 153* 146* 155* 198* 207*    CBG (last 3)   Recent Labs  09/01/12 1706 09/01/12 2128 09/02/12 0804  GLUCAP 311* 234* 191*    Scheduled Meds: . arformoterol  15 mcg Nebulization BID  . atorvastatin  40 mg Oral QHS  . budesonide (PULMICORT) nebulizer solution  0.5 mg Nebulization BID  . citalopram  10 mg Oral Daily  . diltiazem  180 mg Oral BID  . ezetimibe  10 mg Oral Daily  . furosemide  120 mg Intravenous BID  . guaiFENesin  1,200 mg Oral BID  . hydrALAZINE  50 mg Oral BID  . insulin aspart  0-9 Units Subcutaneous TID WC  . insulin glargine  5 Units Subcutaneous QHS  . levofloxacin  500 mg Oral Q48H  . magnesium oxide  400 mg Oral Daily  .  metoprolol succinate  25 mg Oral BID WC  . multivitamin  1 tablet Oral Daily  . pantoprazole  40 mg Oral Daily  . predniSONE  40 mg Oral Daily  . sodium chloride  3 mL Intravenous Q12H  . tiotropium  18 mcg Inhalation Daily      Clarene Duke RD, LDN Pager [phone] After Hours pager [phone]

## 2012-09-03 ENCOUNTER — Other Ambulatory Visit: Payer: Medicare Other | Admitting: Lab

## 2012-09-03 ENCOUNTER — Ambulatory Visit: Payer: Medicare Other | Admitting: Adult Health

## 2012-09-03 DIAGNOSIS — J156 Pneumonia due to other aerobic Gram-negative bacteria: Principal | ICD-10-CM

## 2012-09-03 LAB — CBC
HCT: 35.2 % — ABNORMAL LOW (ref 39.0–52.0)
Hemoglobin: 11.8 g/dL — ABNORMAL LOW (ref 13.0–17.0)
MCHC: 33.5 g/dL (ref 30.0–36.0)
MCV: 91.7 fL (ref 78.0–100.0)

## 2012-09-03 LAB — GLUCOSE, CAPILLARY
Glucose-Capillary: 166 mg/dL — ABNORMAL HIGH (ref 70–99)
Glucose-Capillary: 217 mg/dL — ABNORMAL HIGH (ref 70–99)

## 2012-09-03 LAB — BASIC METABOLIC PANEL
BUN: 81 mg/dL — ABNORMAL HIGH (ref 6–23)
Chloride: 92 mEq/L — ABNORMAL LOW (ref 96–112)
Creatinine, Ser: 2.39 mg/dL — ABNORMAL HIGH (ref 0.50–1.35)
Glucose, Bld: 187 mg/dL — ABNORMAL HIGH (ref 70–99)
Potassium: 3.2 mEq/L — ABNORMAL LOW (ref 3.5–5.1)

## 2012-09-03 MED ORDER — POTASSIUM CHLORIDE CRYS ER 20 MEQ PO TBCR
40.0000 meq | EXTENDED_RELEASE_TABLET | Freq: Every day | ORAL | Status: DC
  Administered 2012-09-03: 40 meq via ORAL
  Filled 2012-09-03 (×2): qty 2

## 2012-09-03 MED ORDER — AMIODARONE HCL 200 MG PO TABS
400.0000 mg | ORAL_TABLET | Freq: Two times a day (BID) | ORAL | Status: DC
  Administered 2012-09-03 – 2012-09-15 (×25): 400 mg via ORAL
  Filled 2012-09-03 (×27): qty 2

## 2012-09-03 MED ORDER — PREDNISONE 20 MG PO TABS
30.0000 mg | ORAL_TABLET | Freq: Every day | ORAL | Status: DC
  Administered 2012-09-04 – 2012-09-05 (×2): 30 mg via ORAL
  Filled 2012-09-03 (×2): qty 1

## 2012-09-03 NOTE — Progress Notes (Signed)
PULMONARY  / CRITICAL CARE MEDICINE  Name: [name] MASSMAN Sr. MRN: [id number] DOB: [birth year]    ADMISSION DATE:  08/27/2012 CONSULTATION DATE:  08/28/2012  REFERRING MD :  Berton Mount PRIMARY SERVICE:  [facility]  CHIEF COMPLAINT:  Short of breath  BRIEF PATIENT DESCRIPTION:  79 yo male former smoker presented with severe dyspnea from acute pulmonary edema, acute diastolic dysfunction, and new onset A fib with RVR.  He is followed by Dr. [name] for severe COPD, chronic respiratory failure on home oxygen, OSA on BiPAP, and NSCLC s/p RULectomy and adjuvant chemo in 2006.  PCCM consulted to assist with respiratory management, and to assist determination whether he is candidate for cardioversion.  He was recently treated as outpt for Lt ankle cellulitis.  SIGNIFICANT EVENTS: 6/12> Admit with A fib RVR, acute diastolic heart failure 6/14> Hemoptysis on Xarelto=> stopped, placed on Levaquin/ Solumedrol. 6/15> wife reports similar lumps of bloody phlegm thru the night; seems diminished this AM, persist rhonchi/congestion.  STUDIES:  6/12 Echo >> mod LVH, EF 65 to 70%, mild/mod LA and RA dilation 615 CT chest>>>Markedly asymmetric bilateral airspace disease. Airspace disease is most prominent, with areas of consolidation, in the posterior and inferior right upper lobe ( the patient has a history of right lower lobectomy). Findings in the right upper lobe are favored to be due to pneumonia or aspiration. 2. New ground-glass opacities in the left upper lobe and left lower lobe may be due to early infection. Underlying mild pulmonary edema cannot be excluded in this patient with cardiomegaly and small bilateral pleural effusions. 3. New 5 mm pulmonary nodule left upper lobe. 4. Cardiomegaly with three-vessel coronary artery disease and dual lead cardiac pacer. 5. Small bilateral pleural effusions 6. Aortic valve calcifications. This finding raises the possibility of aortic valvular stenosis.   LINES /  TUBES: PIV  CULTURES: Blood 6/12 >> MRSA screen 6/12 >> POSITIVE BAL 6/17>>> rare GNR, rare yeast>>>E Coli and candida  ANTIBIOTICS: Doxycycline 6/10 >> 6/12 Vancomycin 6/12 >>6/14 Zosyn 6/12 >> 6/14 Levaquin 6/14(e coli in lung)>>>  Subjective/Overnight:  No further hemoptysis   VITAL SIGNS: Temp:  [97.7 F (36.5 C)-98.3 F (36.8 C)] 98.2 F (36.8 C) (06/19 1430) Pulse Rate:  [64-82] 78 (06/19 1430) Resp:  [18-24] 18 (06/19 1430) BP: (122-134)/(76-87) 123/78 mmHg (06/19 1430) SpO2:  [93 %-96 %] 95 % (06/19 1430) Weight:  [93 kg (205 lb 0.4 oz)] 93 kg (205 lb 0.4 oz) (06/19 0626) 4 liters [place]  PHYSICAL EXAMINATION: General:  Chronically ill appearing male, No distress, speaks in full sentences, using accessory muscles Neuro:  Alert, follows commands, normal strength, seems anxious HEENT:  Mm moist, no JVD Cardiovascular:  Irregular, tachycardic, no murmur Lungs:  Decreased breath sounds, basilar rales, exp wheeze R>L but improved Abdomen:  Soft, non tender, + bowel sounds Musculoskeletal:  2+ leg edema R>L    Recent Labs Lab 09/01/12 0454 09/02/12 1245 09/03/12 0527  NA 136 134* 132*  K 3.6 3.7 3.2*  CL 96 92* 92*  CO2 30 29 28   BUN 70* 80* 81*  CREATININE 2.33* 2.54* 2.39*  GLUCOSE 207* 249* 187*    Recent Labs Lab 09/01/12 0454 09/02/12 1245 09/03/12 0527  HGB 11.3* 12.1* 11.8*  HCT 33.9* 35.1* 35.2*  WBC 14.7* 18.5* 19.3*  PLT 362 369 387   No results found.  BNP (last 3 results)  Recent Labs  08/27/12 0350 08/28/12 0500 08/31/12 0500  PROBNP 3385.0* 6086.0* 7566.0*     ASSESSMENT /  PLAN:  Hemoptysis in setting of COPD exac and anticoagulation for new AFib. ?r/t new pulm nodule as below.  Much improved. No hemoptysis since 6/17.   Plan: -Anticoag (Xarelto) stopped for now -- ??when to resume with continued AFib. Poss consider apixaban when restart anticoags Ok to do dccv 6/25 and give anticoags on 6/25 -continue COPD Rx as  below... -cont po levaquin for now >>E coli on Fob    New LUL 5mm pulm nodule.  No Endobronchial lesion seen on FOB. (Previously followed by Dr Welton Flakes for oncology) PLAN: Fob cytology neg for CA F/u nodule later with imaging     Acute on chronic respiratory failure 2nd to acute pulmonary edema from acute diastolic heart failure and new onset A fib with RVR.  This in setting of severe COPD and OSA.  Now c/b hemoptysis. Pneumonia seems less likely. Plan: -Cont oxygen with goal SpO2 > 90% - change solumedrol to PO pred and taper slowly  -BiPAP qhs and prn during the day -negative fluid balance per cardiology and primary team - caution with renal insuff.  -continue GFN 1200mg  Bid -cont brovana/pulmicort nebulizer BID with prn xopenex nebulizer >> d/c dulera while on this regimen -cont cautious diuresis per cards    Lt ankle cellulitis. Plan: -Abx  Cont  levaquin only ok for ecoli   Renal Insuffic:  Cr=2.44 Plan: -per primary team Likely the presence of Cr 2.44 aggravated bleed risk with xarelto use   Insomnia. Plan: -prn xanax, trazodone   Caryl Bis  [phone]  Cell  [phone]  If no response or cell goes to voicemail, call beeper [phone]   09/03/2012  3:37 PM

## 2012-09-03 NOTE — Progress Notes (Signed)
CARDIOLOGY ROUNDING NOTE    Patient Name: [name] JUE Sr. Date of Encounter: 09-03-2012    SUBJECTIVE: Patient feels well.  Wife at bedside.  Breathing improved.   Per pulmonary, ok to resume anticoagulation on 6-25. Plan to start Amiodarone in anticipation of TEE/DCCV (not yet started)  Labs pending this morning.  I/O -1.7L yesterday (-8.6L since admission)  TELEMETRY: Reviewed telemetry pt in atrial fibrillation with controlled ventricular response.  Intermittent ventricular pacing at max track rate, ?atrial undersensing.  Will have device interrogated today   Filed Vitals:   09/02/12 2102 09/03/12 0006 09/03/12 0626 09/03/12 0835  BP:   134/87   Pulse: 64 65 82   Temp:   97.7 F (36.5 C)   TempSrc:   Axillary   Resp: 18 18 24    Height:      Weight:   205 lb 0.4 oz (93 kg)   SpO2: 96% 95% 93% 94%  BP 134/87  Pulse 82  Temp(Src) 97.7 F (36.5 C) (Axillary)  Resp 24  Ht 5\' 7"  (1.702 m)  Wt 205 lb 0.4 oz (93 kg)  BMI 32.1 kg/m2  SpO2 94%  Well developed and nourished in mild respiratory distress HENT normal Neck supple   Clear-moslty with some coarse sounds Regular rate and rhythm, no murmurs or gallops Abd-soft with active BS No Clubbing cyanosis 1-2+ edema Skin-warm and dry bruises  A & Oriented  Grossly normal sensory and motor function       Intake/Output Summary (Last 24 hours) at 09/03/12 1109 Last data filed at 09/02/12 2114  Gross per 24 hour  Intake      0 ml  Output   1250 ml  Net  -1250 ml    LABS: Basic Metabolic Panel:  Recent Labs  40/98/11 1245 09/03/12 0527  NA 134* 132*  K 3.7 3.2*  CL 92* 92*  CO2 29 28  GLUCOSE 249* 187*  BUN 80* 81*  CREATININE 2.54* 2.39*  CALCIUM 8.5 8.3*   CBC:  Recent Labs  09/02/12 1245 09/03/12 0527  WBC 18.5* 19.3*  NEUTROABS 16.5*  --   HGB 12.1* 11.8*  HCT 35.1* 35.2*  MCV 91.2 91.7  PLT 369 387     Radiology/Studies:  Dg Chest 2 View 09/01/2012   *RADIOLOGY REPORT*  Clinical  Data: Shortness of breath.  CHEST - 2 VIEW  Comparison: 08/31/2012.  Findings: The left subclavian Port-A-Cath tip it is most likely in the mid SVC at the level of the carina.  Stable pacer wires.  The heart is enlarged but unchanged.  Persistent bilateral infiltrates and effusions.  IMPRESSION:  1.  The Port-A-Cath tip appears to be in the mid SVC. 2.  Persistent bilateral infiltrates and small effusions.   Original Report Authenticated By: Rudie Meyer, [name]    Principal Problem:   Acute-on-chronic respiratory failure Active Problems:   CARCINOMA, LUNG, SQUAMOUS CELL   CHRONIC OBSTRUCTIVE PULMONARY DISEASE, SEVERE   HTN (hypertension)   DM (diabetes mellitus) type II controlled with renal manifestation   Atrial fibrillation   CKD (chronic kidney disease), stage III   Cellulitis of left ankle   Acute on chronic diastolic congestive heart failure   Leukocytosis   Elevation of cardiac enzymes   Hemoptysis   Pneumonia due to Gram-negative bacteria  Would continue IV lasis until t CR has started to bump Hopefully home in next  2-3 dayshrs  With anticipation of apixoban at the end of next week and   TEEDCCV  the week after Begin amiodarone if ok with Dr PAW__he assented side effects reviewed with family::LFTs and TSH ok 6/14

## 2012-09-03 NOTE — Progress Notes (Signed)
Patient is currently active with [facility] Care Management for chronic disease management services.  Patient has been engaged by a [employer].  Our community based plan of care has focused on disease management of COPD, CHF, and Diabetes management.  Patient has been inconsistent with weight and blood sugar monitoring.  We would like to put telehealth weight monitoring in place prior to discharge to monitor patients accountability.  We collaborate with inpatient care management to this affect.  Patient will receive a post discharge transition of care call and will be evaluated for monthly home visits for assessments and disease process education.  Made Letha Cape RN inpatient Case Manager aware that [facility] Care Management following. Of note, [facility] Care Management services does not replace or interfere with any services that are arranged by inpatient case management or social work.  For additional questions or referrals please contact Anibal Henderson BSN RN [facility] Liaison at [phone].

## 2012-09-03 NOTE — Progress Notes (Addendum)
PATIENT DETAILS Name: [name] Stevenson. Age: 79 y.o. Sex: male Date of Birth: [birth year] Admit Date: 08/27/2012 Admitting Physician Clydia Llano, MD YNW:GNFAO [name], MD  Subjective: Some mild hemoptysis did recur today. Otherwise no major complaints.  Assessment/Plan: Acute-on-chronic respiratory failure with hypoxia due to:  Acute on chronic diastolic heart failure CHF  Acute on CHRONIC OBSTRUCTIVE PULMONARY DISEASE, SEVERE - Improved  - Good response to Lasix infusion -cont supportive care with nebs/MDI and prednisone - Continue with Levaquin  Acute diastolic heart failure - Cardiology following - Continue with Lasix infusion - Slowly improving and getting more compensated. More than 8 L negative balance  Atrial fibrillation - Unfortunately off anti-coagulation given hemoptysis, per Dr. Delford Field, okay to resume anticoagulation in one week time - On metoprolol and Cardizem for rate control - Cardiology planning cardioversion when anticoagulation can be resumed. Cardiology also contemplating starting amiodarone  Hemoptysis -? Etiology- provoked by xarelto-? Recurrence of underlying malignancy - Unfortunately a small amount of hemoptysis this recurred this morning -  Status post a bronchoscopy with no endobronchial lesions seen, only old clot seen on bronchoscopy - BAL positive for gram-negative rods-on Levaquin- await final culture  CKD (chronic kidney disease), stage III - Creatinine slightly above usual baseline, however on Lasix infusion. Monitor renal function closely.  Leukocytosis - Multifactorial from possible pneumonia, on steroids as well - Monitor - On Levaquin  Hypertension - Controlled- continue with current medication  Diabetes - Increase Lantus to 10 units, continue with SSI  CORONARY ARTERY DISEASE (nonobstructive disease 2006 catheterization)/Elevation of cardiac enzymes  -no CP and likely due to acute HF, demand ischemia from recent hypoxia and  worsened renal function - Cardiology following  Cellulitis of left ankle  -has decreased rednessboth legs c/w stasis dermatitis- most resolved today. Maintain on Levaquin only.  NSCLC s/p RULectomy and adjuvant chemo in 2006 - Does have a new lung nodule-will need close followup as an outpatient  Disposition: Remain inpatient  DVT Prophylaxis:  SCD's  Code Status: Full code  Family Communication Spouse at bedside  Procedures:  None  CONSULTS:  cardiology and pulmonary/intensive care   MEDICATIONS: Scheduled Meds: . arformoterol  15 mcg Nebulization BID  . atorvastatin  40 mg Oral QHS  . budesonide (PULMICORT) nebulizer solution  0.5 mg Nebulization BID  . citalopram  10 mg Oral Daily  . diltiazem  180 mg Oral BID  . ezetimibe  10 mg Oral Daily  . furosemide  120 mg Intravenous BID  . guaiFENesin  1,200 mg Oral BID  . hydrALAZINE  50 mg Oral BID  . insulin aspart  0-9 Units Subcutaneous TID WC  . insulin glargine  10 Units Subcutaneous QHS  . levofloxacin  500 mg Oral Q48H  . magnesium oxide  400 mg Oral Daily  . metoprolol succinate  25 mg Oral BID WC  . multivitamin  1 tablet Oral Daily  . pantoprazole  40 mg Oral Daily  . predniSONE  40 mg Oral Daily  . sodium chloride  3 mL Intravenous Q12H  . tiotropium  18 mcg Inhalation Daily   Continuous Infusions:  PRN Meds:.ALPRAZolam, levalbuterol, morphine injection, nitroGLYCERIN, ondansetron (ZOFRAN) IV, ondansetron, oxyCODONE, polyvinyl alcohol, sodium chloride, traZODone  Antibiotics: Anti-infectives   Start     Dose/Rate Route Frequency Ordered Stop   08/29/12 1200  levofloxacin (LEVAQUIN) tablet 500 mg     500 mg Oral Every 48 hours 08/29/12 0957     08/27/12 0900  vancomycin (VANCOCIN) 1,250 mg in sodium  chloride 0.9 % 250 mL IVPB  Status:  Discontinued     1,250 mg 166.7 mL/hr over 90 Minutes Intravenous Every 24 hours 08/27/12 0826 08/28/12 1809   08/27/12 0900  piperacillin-tazobactam (ZOSYN) IVPB  3.375 g  Status:  Discontinued     3.375 g 12.5 mL/hr over 240 Minutes Intravenous 3 times per day 08/27/12 0826 08/28/12 1809   08/27/12 0600  levofloxacin (LEVAQUIN) IVPB 500 mg     500 mg 100 mL/hr over 60 Minutes Intravenous  Once 08/27/12 0554 08/27/12 0735       PHYSICAL EXAM: Vital signs in last 24 hours: Filed Vitals:   09/02/12 2102 09/03/12 0006 09/03/12 0626 09/03/12 0835  BP:   134/87   Pulse: 64 65 82   Temp:   97.7 F (36.5 C)   TempSrc:   Axillary   Resp: 18 18 24    Height:      Weight:   93 kg (205 lb 0.4 oz)   SpO2: 96% 95% 93% 94%    Weight change:  Filed Weights   08/29/12 0630 08/31/12 0641 09/03/12 0626  Weight: 94.1 kg (207 lb 7.3 oz) 93.9 kg (207 lb 0.2 oz) 93 kg (205 lb 0.4 oz)   Body mass index is 32.1 kg/(m^2).   Gen Exam: Awake and alert with clear speech.   Neck: Supple, No JVD.   Chest: B/L Clear.   CVS: S1 S2 Regular, no murmurs.  Abdomen: soft, BS +, non tender, non distended.  Extremities: 1+ edema, lower extremities warm to touch. Neurologic: Non Focal.   Skin: No Rash.   Wounds: N/A.   Intake/Output from previous day:  Intake/Output Summary (Last 24 hours) at 09/03/12 1104 Last data filed at 09/02/12 2114  Gross per 24 hour  Intake      0 ml  Output   1250 ml  Net  -1250 ml     LAB RESULTS: CBC  Recent Labs Lab 08/29/12 0400 08/31/12 0500 09/01/12 0454 09/02/12 1245 09/03/12 0527  WBC 19.3* 10.5 14.7* 18.5* 19.3*  HGB 11.5* 11.5* 11.3* 12.1* 11.8*  HCT 33.8* 34.5* 33.9* 35.1* 35.2*  PLT 297 347 362 369 387  MCV 92.3 92.2 91.6 91.2 91.7  MCH 31.4 30.7 30.5 31.4 30.7  MCHC 34.0 33.3 33.3 34.5 33.5  RDW 16.1* 15.8* 15.8* 15.5 15.4  LYMPHSABS  --  0.8  --  0.8  --   MONOABS  --  0.2  --  1.2*  --   EOSABS  --  0.0  --  0.0  --   BASOSABS  --  0.0  --  0.0  --     Chemistries   Recent Labs Lab 08/28/12 0500 08/29/12 0400 08/30/12 0450 08/31/12 0500 09/01/12 0454 09/02/12 1245 09/03/12 0527  NA 137 137  138 138 136 134* 132*  K 3.1* 3.6 3.5 3.4* 3.6 3.7 3.2*  CL 97 95* 96 96 96 92* 92*  CO2 27 27 28 30 30 29 28   GLUCOSE 153* 146* 155* 198* 207* 249* 187*  BUN 42* 47* 52* 57* 70* 80* 81*  CREATININE 2.32* 2.44* 2.21* 2.26* 2.33* 2.54* 2.39*  CALCIUM 8.3* 8.5 8.6 8.9 8.9 8.5 8.3*  MG 1.9 2.0  --   --   --   --   --     CBG:  Recent Labs Lab 09/02/12 0804 09/02/12 1144 09/02/12 1716 09/02/12 2059 09/03/12 0742  GLUCAP 191* 268* 311* 264* 158*    GFR Estimated Creatinine Clearance: 28.2  ml/min (by C-G formula based on Cr of 2.39).  Coagulation profile  Recent Labs Lab 08/27/12 1438  INR 1.04    Cardiac Enzymes  Recent Labs Lab 08/27/12 1405 08/27/12 1958  TROPONINI 1.36* 1.71*    No components found with this basename: POCBNP,  No results found for this basename: DDIMER,  in the last 72 hours No results found for this basename: HGBA1C,  in the last 72 hours No results found for this basename: CHOL, HDL, LDLCALC, TRIG, CHOLHDL, LDLDIRECT,  in the last 72 hours No results found for this basename: TSH, T4TOTAL, FREET3, T3FREE, THYROIDAB,  in the last 72 hours No results found for this basename: VITAMINB12, FOLATE, FERRITIN, TIBC, IRON, RETICCTPCT,  in the last 72 hours No results found for this basename: LIPASE, AMYLASE,  in the last 72 hours  Urine Studies No results found for this basename: UACOL, UAPR, USPG, UPH, UTP, UGL, UKET, UBIL, UHGB, UNIT, UROB, ULEU, UEPI, UWBC, URBC, UBAC, CAST, CRYS, UCOM, BILUA,  in the last 72 hours  MICROBIOLOGY: Recent Results (from the past 240 hour(s))  CULTURE, BLOOD (ROUTINE X 2)     Status: None   Collection Time    08/27/12  8:30 AM      Result Value Range Status   Specimen Description BLOOD LEFT HAND   Final   Special Requests BOTTLES DRAWN AEROBIC ONLY 5CC   Final   Culture  Setup Time 08/27/2012 14:16   Final   Culture NO GROWTH 5 DAYS   Final   Report Status 09/02/2012 FINAL   Final  MRSA PCR SCREENING     Status:  Abnormal   Collection Time    08/27/12  8:38 AM      Result Value Range Status   MRSA by PCR POSITIVE (*) NEGATIVE Final   Comment:            The GeneXpert MRSA Assay (FDA     approved for NASAL specimens     only), is one component of a     comprehensive MRSA colonization     surveillance program. It is not     intended to diagnose MRSA     infection nor to guide or     monitor treatment for     MRSA infections.     RESULT CALLED TO, READ BACK BY AND VERIFIED WITH:     Esperanza Richters AT 1148 08/27/12 BY K BARR  CULTURE, BLOOD (ROUTINE X 2)     Status: None   Collection Time    08/27/12  8:40 AM      Result Value Range Status   Specimen Description BLOOD LEFT HAND   Final   Special Requests BOTTLES DRAWN AEROBIC ONLY 4CC   Final   Culture  Setup Time 08/27/2012 14:16   Final   Culture NO GROWTH 5 DAYS   Final   Report Status 09/02/2012 FINAL   Final  URINE CULTURE     Status: None   Collection Time    08/27/12  8:28 PM      Result Value Range Status   Specimen Description URINE, CLEAN CATCH   Final   Special Requests NONE   Final   Culture  Setup Time 08/28/2012 09:51   Final   Colony Count NO GROWTH   Final   Culture NO GROWTH   Final   Report Status 08/29/2012 FINAL   Final  CULTURE, EXPECTORATED SPUTUM-ASSESSMENT     Status: None   Collection Time  08/31/12  8:38 AM      Result Value Range Status   Specimen Description SPUTUM   Final   Special Requests NONE   Final   Sputum evaluation     Final   Value: MICROSCOPIC FINDINGS SUGGEST THAT THIS SPECIMEN IS NOT REPRESENTATIVE OF LOWER RESPIRATORY SECRETIONS. PLEASE RECOLLECT.     CALLED TO Mackie Pai 08/31/12 0913 BY K SCHULTZ   Report Status 08/31/2012 FINAL   Final  CULTURE, EXPECTORATED SPUTUM-ASSESSMENT     Status: None   Collection Time    08/31/12  1:10 PM      Result Value Range Status   Specimen Description SPUTUM   Final   Special Requests NONE   Final   Sputum evaluation     Final   Value: THIS SPECIMEN  IS ACCEPTABLE. RESPIRATORY CULTURE REPORT TO FOLLOW.   Report Status 08/31/2012 FINAL   Final  CULTURE, RESPIRATORY (NON-EXPECTORATED)     Status: None   Collection Time    08/31/12  1:10 PM      Result Value Range Status   Specimen Description SPUTUM   Final   Special Requests NONE   Final   Gram Stain     Final   Value: RARE WBC PRESENT, PREDOMINANTLY PMN     RARE SQUAMOUS EPITHELIAL CELLS PRESENT     NO ORGANISMS SEEN   Culture NORMAL OROPHARYNGEAL FLORA   Final   Report Status 09/02/2012 FINAL   Final  AFB CULTURE WITH SMEAR     Status: None   Collection Time    09/01/12 11:19 AM      Result Value Range Status   Specimen Description BRONCHIAL WASHINGS   Final   Special Requests NONE   Final   ACID FAST SMEAR NO ACID FAST BACILLI SEEN   Final   Culture     Final   Value: CULTURE WILL BE EXAMINED FOR 6 WEEKS BEFORE ISSUING A FINAL REPORT   Report Status PENDING   Incomplete  FUNGUS CULTURE W SMEAR     Status: None   Collection Time    09/01/12 11:19 AM      Result Value Range Status   Specimen Description BRONCHIAL WASHINGS   Final   Special Requests NONE   Final   Fungal Smear FEW HYPHAL ELEMENTS   Final   Culture CULTURE IN PROGRESS FOR FOUR WEEKS   Final   Report Status PENDING   Incomplete  LEGIONELLA CULTURE     Status: None   Collection Time    09/01/12 11:19 AM      Result Value Range Status   Specimen Description BRONCHIAL WASHINGS   Final   Special Requests NONE   Final   Culture     Final   Value: NO LEGIONELLA ISOLATED, CULTURE IN PROGRESS FOR 5 DAYS   Report Status PENDING   Incomplete  CULTURE, RESPIRATORY (NON-EXPECTORATED)     Status: None   Collection Time    09/01/12 11:19 AM      Result Value Range Status   Specimen Description BRONCHIAL WASHINGS   Final   Special Requests NONE   Final   Gram Stain     Final   Value: RARE WBC PRESENT, PREDOMINANTLY PMN     RARE SQUAMOUS EPITHELIAL CELLS PRESENT     RARE YEAST     RARE GRAM NEGATIVE RODS    Culture     Final   Value: FEW ESCHERICHIA COLI     FEW CANDIDA  ALBICANS   Report Status PENDING   Incomplete    RADIOLOGY STUDIES/RESULTS: Dg Chest 2 View  09/01/2012   *RADIOLOGY REPORT*  Clinical Data: Shortness of breath.  CHEST - 2 VIEW  Comparison: 08/31/2012.  Findings: The left subclavian Port-A-Cath tip it is most likely in the mid SVC at the level of the carina.  Stable pacer wires.  The heart is enlarged but unchanged.  Persistent bilateral infiltrates and effusions.  IMPRESSION:  1.  The Port-A-Cath tip appears to be in the mid SVC. 2.  Persistent bilateral infiltrates and small effusions.   Original Report Authenticated By: Rudie Meyer, [name]   Ct Chest Wo Contrast  08/30/2012   *RADIOLOGY REPORT*  Clinical Data: Hemoptysis.  Pneumonia versus edema.  CT CHEST WITHOUT CONTRAST  Technique:  Multidetector CT imaging of the chest was performed following the standard protocol without IV contrast.  Comparison: Chest radiograph 08/30/2012 and chest CT 02/05/2012  Findings: Heart is enlarged.  Cardiomegaly appears stable. Stable prominent fat density, consistent with lipoma, between the left and right atria.  Dual lead pacer is present.  There is heavy coronary artery atherosclerotic calcification of all three coronary arteries.  Aortic valvular calcifications are present.  A left IJ central venous catheter terminates in the superior vena cava. Thyroid gland and thoracic inlet appear within normal limits.  Heavy atherosclerotic calcification of the thoracic aorta, without aneurysm.  Several mediastinal lymph nodes are visualized, and within normal limits for size.  No pathologically enlarged lymph nodes are seen in the thorax.  Evaluation for the hilar lymph nodes is somewhat limited without intravenous contrast.  Small bilateral pleural effusions are present.  Multilevel degenerative changes of the thoracic spine.  Multiple benign hemangiomas in the thoracic spine vertebral bodies.  No acute or  suspicious osseous abnormality.  No acute findings are identified in the abdomen.  Surgical clips are seen in the left upper quadrant.  There are postsurgical changes of the right lower lobectomy.  There is a background of centrilobular emphysema.  Stable scarring in the right upper lobe anteriorly.  No definite intralobular septal thickening.  There is markedly asymmetric airspace disease in the inferior and posterior aspect of the right upper lobe, where there are areas of consolidation.  There are ground-glass opacities which are new compared to prior chest CT in the left upper lobe, left lower lobe, and right middle lobe.  5 mm pulmonary nodule in the left upper lobe peripherally on image #27 of the lung windows is new.  IMPRESSION:  1.  Markedly asymmetric bilateral airspace disease.  Airspace disease is most prominent, with areas of consolidation, in the posterior and inferior right upper lobe ( the patient has a history of right lower lobectomy).  Findings in the right upper lobe are favored to be due to pneumonia or aspiration. 2.  New ground-glass opacities in the left upper lobe and left lower lobe may be due to early infection.  Underlying mild pulmonary edema cannot be excluded in this patient with cardiomegaly and small bilateral pleural effusions. 3.  New 5 mm pulmonary nodule left upper lobe. If the patient is at high risk for bronchogenic carcinoma, follow-up chest CT at 6-12 months is recommended.  If the patient is at low risk for bronchogenic carcinoma, follow-up chest CT at 12 months is recommended.  This recommendation follows the consensus statement: Guidelines for Management of Small Pulmonary Nodules Detected on CT Scans: A Statement from the Fleischner Society as published in Radiology 2005; 237:395-400. 4.  Cardiomegaly with three-vessel coronary artery disease and dual lead cardiac pacer. 5.  Small bilateral pleural effusions 6.  Aortic valve calcifications. This finding raises the  possibility of aortic valvular stenosis.  .   Original Report Authenticated By: Britta Mccreedy, [name]   Dg Chest Port 1 View  08/31/2012   *RADIOLOGY REPORT*  Clinical Data: Follow-up edema  PORTABLE CHEST - 1 VIEW  Comparison: Yesterday  Findings: The tip of the left subclavian Port-A-Cath has changed its orientation.  Based on a single view, it is likely in the azygos vein.  Right subclavian dual lead pacemaker device and leads are stable and intact.  Bilateral pleural effusions left greater than right are stable.  Diffuse edema is stable. Cardiomegaly.  IMPRESSION: Left subclavian Port-A-Cath tip has changed its position and is likely in the azygos vein.  This can be confirmed with a lateral view.  Stable airspace disease and pleural effusions.   Original Report Authenticated By: Jolaine Click, [name]   Dg Chest Port 1 View  08/30/2012   *RADIOLOGY REPORT*  Clinical Data: Respiratory failure.  PORTABLE CHEST - 1 VIEW  Comparison: 08/29/2012 and 08/27/2012  Findings: Prominent interstitial markings could represent underlying edema.  There are persistent interstitial and airspace densities in the right mid and lower lung region.  Heart size remains enlarged.  Again noted is a right dual lead cardiac pacemaker.  Left subclavian Port-A-Cath in the SVC region.  Aortic arch is heavily calcified.  IMPRESSION:  Prominent interstitial markings may represent underlying edema. Again noted are increased densities in the right mid and lower lung region which could represent asymmetric edema but cannot exclude an infectious etiology.   Original Report Authenticated By: Richarda Overlie, [name]   Dg Chest Port 1 View  08/29/2012   *RADIOLOGY REPORT*  Clinical Data: Follow-up CHF  PORTABLE CHEST - 1 VIEW  Comparison: Prior chest x-ray 08/27/2012  Findings: Stable position of left subclavian central venous catheter with the tip in the mid superior vena cava.  Right subclavian approach cardiac rhythm maintenance device with leads in the  right atrium and right ventricle.  Slightly improved inspiratory volumes with clearing in the right base.  Stable elevation of the left hemidiaphragm resulting in blunting of the left costophrenic angle.  Decreasing interstitial edema with the residual asymmetric patchy opacity in the right mid and lower lobe. Small residual right pleural effusion.  Stable cardiomegaly. Aortic atherosclerosis again noted.  No pneumothorax.  IMPRESSION:  1.  Decreasing pulmonary edema with improved inspiratory volumes and clearing in the right base. 2.  Residual asymmetric patchy opacity in the right mid and lower lung may reflect residual asymmetric alveolar edema, or pneumonia. 3.  Persistent small right pleural effusion, chronic elevation of the left hemidiaphragm and cardiomegaly.   Original Report Authenticated By: Malachy Moan, [name]   Dg Chest Portable 1 View  08/27/2012   *RADIOLOGY REPORT*  Clinical Data: Shortness of breath.  History of lung cancer post lobectomy.  PORTABLE CHEST - 1 VIEW  Comparison: 02/02/2012  Findings: Shallow inspiration.  Cardiac enlargement with pulmonary vascular congestion and perihilar edema.  Bilateral pleural effusions.  Atelectasis in the lung bases.  No pneumothorax. Changes have developed since the previous study.  Postoperative changes in the right chest.  Stable appearance of cardiac pacemaker and left central venous catheter.  Calcified and tortuous aorta.  IMPRESSION: Interval development of cardiac enlargement, pulmonary vascular congestion, perihilar edema, and bilateral pleural effusions.   Original Report Authenticated By: Burman Nieves, [name]  Jeoffrey Massed, MD  [facility] Pager:[phone]  If 7PM-7AM, please contact night-coverage [email or web address] Password [email or web address] 09/03/2012, 11:04 AM   LOS: 7 days

## 2012-09-03 NOTE — Progress Notes (Signed)
Occupational Therapy Treatment Patient Details Name: [name] HYLAND Sr. MRN: [id number] DOB: 1932-06-12 Today's Date: 09/03/2012 Time: 4098-1191 OT Time Calculation (min): 19 min  OT Assessment / Plan / Recommendation Comments on Treatment Session Pt is making steady progress in activity tolerance.  Requires min guard assist for transfers and mobility. Educated in energy conservation.    Follow Up Recommendations  No OT follow up;Supervision/Assistance - 24 hour    Barriers to Discharge       Equipment Recommendations  None recommended by OT    Recommendations for Other Services    Frequency Min 2X/week   Plan Discharge plan remains appropriate    Precautions / Restrictions Precautions Precautions: Fall   Pertinent Vitals/Pain No c/o pain, remained on  4 L 02 throughout session    ADL  Grooming: Wash/dry hands;Teeth care;Min guard Where Assessed - Grooming: Unsupported standing Toilet Transfer: Min Pension scheme manager Method: Sit to Barista: Regular height toilet Toileting - Clothing Manipulation and Hygiene: Min guard Where Assessed - Toileting Clothing Manipulation and Hygiene: Sit to stand from 3-in-1 or toilet Equipment Used: Rolling walker Transfers/Ambulation Related to ADLs: ambulated with RW and min guard assist within room ADL Comments: Educated pt in energy conservation, pacing, purse lip breathing. Reviewed handout.    OT Diagnosis:    OT Problem List:   OT Treatment Interventions:     OT Goals ADL Goals Pt Will Perform Upper Body Bathing: with modified independence;Sitting, chair Pt Will Perform Lower Body Bathing: with modified independence;Sit to stand from chair Pt Will Perform Upper Body Dressing: with modified independence;Sitting, chair;Sitting, bed Pt Will Perform Lower Body Dressing: with modified independence;Sit to stand from chair;Sit to stand from bed Pt Will Transfer to Toilet: with modified independence ADL Goal:  Toilet Transfer - Progress: Progressing toward goals Pt Will Perform Toileting - Clothing Manipulation: Independently ADL Goal: Toileting - Clothing Manipulation - Progress: Progressing toward goals Pt Will Perform Toileting - Hygiene: Independently ADL Goal: Toileting - Hygiene - Progress: Progressing toward goals Additional ADL Goal #1: Patient will be independent with energy conservation techniques for ADLS. ADL Goal: Additional Goal #1 - Progress: Progressing toward goals  Visit Information  Last OT Received On: 09/03/12 Assistance Needed: +1    Subjective Data      Prior Functioning       Cognition  Cognition Arousal/Alertness: Awake/alert Behavior During Therapy: WFL for tasks assessed/performed Overall Cognitive Status: Within Functional Limits for tasks assessed    Mobility  Bed Mobility Bed Mobility: Not assessed (Pt up in chair.) Transfers Transfers: Sit to Stand;Stand to Sit Sit to Stand: 4: Min guard;With upper extremity assist;With armrests;From chair/3-in-1 Stand to Sit: 4: Min guard;With upper extremity assist;With armrests;To chair/3-in-1 Details for Transfer Assistance: verbal cues for hand placement.    Exercises      Balance Static Standing Balance Static Standing - Balance Support: Bilateral upper extremity supported Static Standing - Level of Assistance: 5: Stand by assistance   End of Session OT - End of Session Activity Tolerance: Patient tolerated treatment well Patient left: in chair;with call bell/phone within reach;with family/visitor present  GO     Evern Bio 09/03/2012, 2:41 PM [phone]

## 2012-09-03 NOTE — Progress Notes (Signed)
Physical Therapy Treatment Patient Details Name: [name] ISABELL Sr. MRN: [id number] DOB: [birth year] Today's Date: 09/03/2012 Time: 4098-1191 PT Time Calculation (min): 17 min  PT Assessment / Plan / Recommendation Comments on Treatment Session  Pt continues to make steady progress.    Follow Up Recommendations  Home health PT;Supervision/Assistance - 24 hour     Does the patient have the potential to tolerate intense rehabilitation     Barriers to Discharge        Equipment Recommendations  None recommended by PT    Recommendations for Other Services    Frequency Min 3X/week   Plan Discharge plan remains appropriate;Frequency remains appropriate    Precautions / Restrictions Precautions Precautions: Fall   Pertinent Vitals/Pain See flow sheet.    Mobility  Transfers Sit to Stand: 4: Min guard;With upper extremity assist;With armrests;From chair/3-in-1 Stand to Sit: 4: Min guard;With upper extremity assist;With armrests;To chair/3-in-1 Details for Transfer Assistance: verbal cues for hand placement. Ambulation/Gait Ambulation/Gait Assistance: 4: Min guard Ambulation Distance (Feet): 130 Feet Assistive device: Rolling walker Ambulation/Gait Assistance Details: Dyspnea with amb but able to continue without rest break. Gait Pattern: Step-through pattern;Decreased stride length;Wide base of support Gait velocity: decr    Exercises     PT Diagnosis:    PT Problem List:   PT Treatment Interventions:     PT Goals Acute Rehab PT Goals PT Goal: Sit to Stand - Progress: Progressing toward goal PT Goal: Stand to Sit - Progress: Progressing toward goal PT Goal: Ambulate - Progress: Progressing toward goal  Visit Information  Last PT Received On: 09/03/12 Assistance Needed: +1    Subjective Data  Subjective: Pt states he feels better than the other day.   Cognition  Cognition Arousal/Alertness: Awake/alert Behavior During Therapy: WFL for tasks  assessed/performed Overall Cognitive Status: Within Functional Limits for tasks assessed    Balance  Static Standing Balance Static Standing - Balance Support: Bilateral upper extremity supported Static Standing - Level of Assistance: 5: Stand by assistance  End of Session PT - End of Session Equipment Utilized During Treatment: Oxygen Activity Tolerance: Patient limited by fatigue Patient left: in chair;with call bell/phone within reach;with family/visitor present Nurse Communication: Mobility status   GP     [name] Stevenson 09/03/2012, 1:05 PM  [employer] PT [phone]

## 2012-09-04 DIAGNOSIS — N058 Unspecified nephritic syndrome with other morphologic changes: Secondary | ICD-10-CM

## 2012-09-04 DIAGNOSIS — E1129 Type 2 diabetes mellitus with other diabetic kidney complication: Secondary | ICD-10-CM

## 2012-09-04 LAB — CULTURE, RESPIRATORY W GRAM STAIN

## 2012-09-04 LAB — BASIC METABOLIC PANEL
CO2: 25 mEq/L (ref 19–32)
Chloride: 100 mEq/L (ref 96–112)
Glucose, Bld: 237 mg/dL — ABNORMAL HIGH (ref 70–99)
Potassium: 3.2 mEq/L — ABNORMAL LOW (ref 3.5–5.1)
Sodium: 137 mEq/L (ref 135–145)

## 2012-09-04 LAB — CBC
HCT: 32.6 % — ABNORMAL LOW (ref 39.0–52.0)
Hemoglobin: 10.9 g/dL — ABNORMAL LOW (ref 13.0–17.0)
RBC: 3.58 MIL/uL — ABNORMAL LOW (ref 4.22–5.81)

## 2012-09-04 LAB — GLUCOSE, CAPILLARY
Glucose-Capillary: 200 mg/dL — ABNORMAL HIGH (ref 70–99)
Glucose-Capillary: 134 mg/dL — ABNORMAL HIGH (ref 70–99)
Glucose-Capillary: 237 mg/dL — ABNORMAL HIGH (ref 70–99)
Glucose-Capillary: 333 mg/dL — ABNORMAL HIGH (ref 70–99)

## 2012-09-04 MED ORDER — SODIUM CHLORIDE 0.9 % IV SOLN
250.0000 mg | Freq: Four times a day (QID) | INTRAVENOUS | Status: DC
  Administered 2012-09-04 – 2012-09-14 (×39): 250 mg via INTRAVENOUS
  Filled 2012-09-04 (×45): qty 250

## 2012-09-04 MED ORDER — POTASSIUM CHLORIDE CRYS ER 20 MEQ PO TBCR
40.0000 meq | EXTENDED_RELEASE_TABLET | Freq: Two times a day (BID) | ORAL | Status: AC
  Administered 2012-09-04 – 2012-09-05 (×4): 40 meq via ORAL
  Filled 2012-09-04 (×3): qty 2

## 2012-09-04 NOTE — Progress Notes (Addendum)
CARDIOLOGY ROUNDING NOTE    Patient Name: [name] Stevenson. Date of Encounter: 09-03-2012    SUBJECTIVE: Patient feels well.  Wife at bedside.  Breathing improved.   Per pulmonary, ok to resume anticoagulation on 6-25. Amiodarone initiated yesterday in anticipation of TEE/DCCV next week.   Labs pending this morning.  I/O -2.2L yesterday (-10.9 L since admission)  Patient with some atrial undersensing noted yesterday on telemetry.  Device reprogrammed to DDIR until cardioversion.   TELEMETRY: Reviewed telemetry pt in atrial fibrillation with controlled ventricular response.    Filed Vitals:   09/03/12 2247 09/03/12 2356 09/04/12 0621 09/04/12 0811  BP: 149/79  112/66 130/74  Pulse:   74 77  Temp:   97 F (36.1 C)   TempSrc:   Axillary   Resp:  20 18   Height:      Weight:      SpO2:   94%   BP 130/74  Pulse 77  Temp(Src) 97 F (36.1 C) (Axillary)  Resp 18  Ht 5\' 7"  (1.702 m)  Wt 205 lb 0.4 oz (93 kg)  BMI 32.1 kg/m2  SpO2 94%  CURRENT MEDICATIONS: . amiodarone  400 mg Oral BID  . arformoterol  15 mcg Nebulization BID  . atorvastatin  40 mg Oral QHS  . budesonide (PULMICORT) nebulizer solution  0.5 mg Nebulization BID  . citalopram  10 mg Oral Daily  . diltiazem  180 mg Oral BID  . ezetimibe  10 mg Oral Daily  . furosemide  120 mg Intravenous BID  . guaiFENesin  1,200 mg Oral BID  . hydrALAZINE  50 mg Oral BID  . imipenem-cilastatin  250 mg Intravenous Q6H  . insulin aspart  0-9 Units Subcutaneous TID WC  . insulin glargine  10 Units Subcutaneous QHS  . levofloxacin  500 mg Oral Q48H  . magnesium oxide  400 mg Oral Daily  . metoprolol succinate  25 mg Oral BID WC  . multivitamin  1 tablet Oral Daily  . pantoprazole  40 mg Oral Daily  . potassium chloride  40 mEq Oral Daily  . predniSONE  30 mg Oral Daily  . sodium chloride  3 mL Intravenous Q12H  . tiotropium  18 mcg Inhalation Daily    Well developed and nourished in no acute distress HENT  normal Neck supple  Clear-al;most Irregular rate and rhythm, no murmurs or gallops Abd-soft with active BS No Clubbing cyanosis 2+ edema Skin-warm and dry A & Oriented  Grossly normal sensory and motor function   Intake/Output Summary (Last 24 hours) at 09/04/12 1004 Last data filed at 09/04/12 0631  Gross per 24 hour  Intake    422 ml  Output   1200 ml  Net   -778 ml    LABS: Basic Metabolic Panel:  Recent Labs  16/10/96 0527 09/04/12 0515  NA 132* 137  K 3.2* 3.2*  CL 92* 100  CO2 28 25  GLUCOSE 187* 237*  BUN 81* 72*  CREATININE 2.39* 2.09*  CALCIUM 8.3* 7.7*   CBC:  Recent Labs  09/02/12 1245 09/03/12 0527 09/04/12 0515  WBC 18.5* 19.3* 19.0*  NEUTROABS 16.5*  --   --   HGB 12.1* 11.8* 10.9*  HCT 35.1* 35.2* 32.6*  MCV 91.2 91.7 91.1  PLT 369 387 306     Radiology/Studies:  Dg Chest 2 View 09/01/2012   *RADIOLOGY REPORT*  Clinical Data: Shortness of breath.  CHEST - 2 VIEW  Comparison: 08/31/2012.  Findings: The  left subclavian Port-A-Cath tip it is most likely in the mid SVC at the level of the carina.  Stable pacer wires.  The heart is enlarged but unchanged.  Persistent bilateral infiltrates and effusions.  IMPRESSION:  1.  The Port-A-Cath tip appears to be in the mid SVC. 2.  Persistent bilateral infiltrates and small effusions.   Original Report Authenticated By: Rudie Meyer, [name]   Principal Problem:   Acute-on-chronic respiratory failure Active Problems:   CARCINOMA, LUNG, SQUAMOUS CELL   CHRONIC OBSTRUCTIVE PULMONARY DISEASE, SEVERE   HTN (hypertension)   DM (diabetes mellitus) type II controlled with renal manifestation   Atrial fibrillation   CKD (chronic kidney disease), stage III   Cellulitis of left ankle   Acute on chronic diastolic congestive heart failure   Leukocytosis   Elevation of cardiac enzymes   Hemoptysis   Pneumonia due to Gram-negative bacteria Hypokalemia--new   Increae K repletion 40 bid ONLY Ordered for 4  DOSES contnue ongoing IV diuresis until CR bumps apixoban next week and TEE DCCV the week to follow On Amio

## 2012-09-04 NOTE — Progress Notes (Signed)
ANTIBIOTIC CONSULT NOTE - INITIAL  Pharmacy Consult for Primaxin Indication: rule out pneumonia  No Known Allergies  Patient Measurements: Height: 5\' 7"  (170.2 cm) Weight: 205 lb 0.4 oz (93 kg) IBW/kg (Calculated) : 66.1  Vital Signs: Temp: 97 F (36.1 C) (06/20 0621) Temp src: Axillary (06/20 0621) BP: 130/74 mmHg (06/20 0811) Pulse Rate: 77 (06/20 0811) Intake/Output from previous day: 06/19 0701 - 06/20 0700 In: 724 [P.O.:600; IV Piggyback:124] Out: 2500 [Urine:2500] Intake/Output from this shift:    Labs:  Recent Labs  09/02/12 1245 09/03/12 0527 09/04/12 0515  WBC 18.5* 19.3* 19.0*  HGB 12.1* 11.8* 10.9*  PLT 369 387 306  CREATININE 2.54* 2.39* 2.09*   Estimated Creatinine Clearance: 32.2 ml/min (by C-G formula based on Cr of 2.09). No results found for this basename: VANCOTROUGH, Leodis Binet, VANCORANDOM, GENTTROUGH, GENTPEAK, GENTRANDOM, TOBRATROUGH, TOBRAPEAK, TOBRARND, AMIKACINPEAK, AMIKACINTROU, AMIKACIN,  in the last 72 hours   Microbiology: Recent Results (from the past 720 hour(s))  CULTURE, BLOOD (ROUTINE X 2)     Status: None   Collection Time    08/27/12  8:30 AM      Result Value Range Status   Specimen Description BLOOD LEFT HAND   Final   Special Requests BOTTLES DRAWN AEROBIC ONLY 5CC   Final   Culture  Setup Time 08/27/2012 14:16   Final   Culture NO GROWTH 5 DAYS   Final   Report Status 09/02/2012 FINAL   Final  MRSA PCR SCREENING     Status: Abnormal   Collection Time    08/27/12  8:38 AM      Result Value Range Status   MRSA by PCR POSITIVE (*) NEGATIVE Final   Comment:            The GeneXpert MRSA Assay (FDA     approved for NASAL specimens     only), is one component of a     comprehensive MRSA colonization     surveillance program. It is not     intended to diagnose MRSA     infection nor to guide or     monitor treatment for     MRSA infections.     RESULT CALLED TO, READ BACK BY AND VERIFIED WITH:     Esperanza Richters AT 1148  08/27/12 BY K BARR  CULTURE, BLOOD (ROUTINE X 2)     Status: None   Collection Time    08/27/12  8:40 AM      Result Value Range Status   Specimen Description BLOOD LEFT HAND   Final   Special Requests BOTTLES DRAWN AEROBIC ONLY 4CC   Final   Culture  Setup Time 08/27/2012 14:16   Final   Culture NO GROWTH 5 DAYS   Final   Report Status 09/02/2012 FINAL   Final  URINE CULTURE     Status: None   Collection Time    08/27/12  8:28 PM      Result Value Range Status   Specimen Description URINE, CLEAN CATCH   Final   Special Requests NONE   Final   Culture  Setup Time 08/28/2012 09:51   Final   Colony Count NO GROWTH   Final   Culture NO GROWTH   Final   Report Status 08/29/2012 FINAL   Final  CULTURE, EXPECTORATED SPUTUM-ASSESSMENT     Status: None   Collection Time    08/31/12  8:38 AM      Result Value Range Status   Specimen Description  SPUTUM   Final   Special Requests NONE   Final   Sputum evaluation     Final   Value: MICROSCOPIC FINDINGS SUGGEST THAT THIS SPECIMEN IS NOT REPRESENTATIVE OF LOWER RESPIRATORY SECRETIONS. PLEASE RECOLLECT.     CALLED TO Mackie Pai 08/31/12 0913 BY K SCHULTZ   Report Status 08/31/2012 FINAL   Final  CULTURE, EXPECTORATED SPUTUM-ASSESSMENT     Status: None   Collection Time    08/31/12  1:10 PM      Result Value Range Status   Specimen Description SPUTUM   Final   Special Requests NONE   Final   Sputum evaluation     Final   Value: THIS SPECIMEN IS ACCEPTABLE. RESPIRATORY CULTURE REPORT TO FOLLOW.   Report Status 08/31/2012 FINAL   Final  CULTURE, RESPIRATORY (NON-EXPECTORATED)     Status: None   Collection Time    08/31/12  1:10 PM      Result Value Range Status   Specimen Description SPUTUM   Final   Special Requests NONE   Final   Gram Stain     Final   Value: RARE WBC PRESENT, PREDOMINANTLY PMN     RARE SQUAMOUS EPITHELIAL CELLS PRESENT     NO ORGANISMS SEEN   Culture NORMAL OROPHARYNGEAL FLORA   Final   Report Status 09/02/2012  FINAL   Final  AFB CULTURE WITH SMEAR     Status: None   Collection Time    09/01/12 11:19 AM      Result Value Range Status   Specimen Description BRONCHIAL WASHINGS   Final   Special Requests NONE   Final   ACID FAST SMEAR NO ACID FAST BACILLI SEEN   Final   Culture     Final   Value: CULTURE WILL BE EXAMINED FOR 6 WEEKS BEFORE ISSUING A FINAL REPORT   Report Status PENDING   Incomplete  FUNGUS CULTURE W SMEAR     Status: None   Collection Time    09/01/12 11:19 AM      Result Value Range Status   Specimen Description BRONCHIAL WASHINGS   Final   Special Requests NONE   Final   Fungal Smear FEW HYPHAL ELEMENTS   Final   Culture CANDIDA ALBICANS   Final   Report Status PENDING   Incomplete  LEGIONELLA CULTURE     Status: None   Collection Time    09/01/12 11:19 AM      Result Value Range Status   Specimen Description BRONCHIAL WASHINGS   Final   Special Requests NONE   Final   Culture     Final   Value: NO LEGIONELLA ISOLATED, CULTURE IN PROGRESS FOR 5 DAYS   Report Status PENDING   Incomplete  CULTURE, RESPIRATORY (NON-EXPECTORATED)     Status: None   Collection Time    09/01/12 11:19 AM      Result Value Range Status   Specimen Description BRONCHIAL WASHINGS   Final   Special Requests NONE   Final   Gram Stain     Final   Value: RARE WBC PRESENT, PREDOMINANTLY PMN     RARE SQUAMOUS EPITHELIAL CELLS PRESENT     RARE YEAST     RARE GRAM NEGATIVE RODS   Culture     Final   Value: FEW ESCHERICHIA COLI     Note: Confirmed Extended Spectrum Beta-Lactamase Producer (ESBL) CRITICAL RESULT CALLED TO, READ BACK BY AND VERIFIED WITH: JENNIFER Z@7 :45AM ON 09/04/12 BY DANTS  FEW CANDIDA ALBICANS   Report Status 09/04/2012 FINAL   Final   Organism ID, Bacteria ESCHERICHIA COLI   Final    Medical History: Past Medical History  Diagnosis Date  . Stricture and stenosis of esophagus 12/07/2004    EGD done on 12/07/2004  . GERD (gastroesophageal reflux disease)   . Hypoxemia      With chronic respiratory failure  . Increased prostate specific antigen (PSA) velocity 02/26/2011  . DM (diabetes mellitus) 02/26/2011  . Gout 02/26/2011  . COPD (chronic obstructive pulmonary disease)     oxygen at home, 4L  . OSA (obstructive sleep apnea) 02/26/2011    CPAP  . Paroxysmal atrial fibrillation   . Depression   . Cardiac arrest 2009    while in hospital   . Hypertension   . HTN (hypertension) 02/26/2011  . Cardiac arrest 2009    while in hospital  . Lung cancer     lung s/p lobectomy x2 on RT lung  . Basal cell carcinoma of nose 02/26/2011  . ETOH abuse 01/29/2012    started drinking again - hx cardiac arrest in 2009 due to dt's with organ failure  . CKD (chronic kidney disease) 02/28/2012  . Chronic diastolic CHF (congestive heart failure)     a. EF 55-60% by echo 2013.  . Sick sinus syndrome     a. s/p [employer] pacemaker 2010.  . Bradycardia     a. Admitted 2010 with severe bradycardia, runs of paroxysmal VT.   Marland Kitchen Paroxysmal VT     a. In 2010 in setting of marked bradycardia.  Marland Kitchen CAD (coronary artery disease)     a. Nonobstructive CAD by cath 2006.  Marland Kitchen Esophageal ulcer     a. By EGD 10/2011.  Marland Kitchen Hiatal hernia     a. By EGD 10/2011.  . Valvular heart disease     a. Mild MR/mild AI/mild AS by echo 05/2011.    Assessment: 19 YOM with chronic respiratory failure d/t COPD/CHF, who had completed 9 days of Levaquin for pneumonia, BAL culture (6/17) is positive for ESBL E.Coli, pharmacy is consulted to start Primaxin. He is afebrile, wbc elevated at 19, scr 2.09, est. crcl ~ 30 ml/min  Goal of Therapy:  Appropriate Primaxin dosing  Plan:  - Start Primaxin 250 mg iv q 6 hrs - f/u renal function  Bayard Hugger, PharmD, BCPS  Clinical Pharmacist  Pager: [phone]   09/04/2012,11:13 AM

## 2012-09-04 NOTE — Progress Notes (Signed)
PATIENT DETAILS Name: [name] WINGROVE Sr. Age: 79 y.o. Sex: male Date of Birth: 1932/05/28 Admit Date: 08/27/2012 Admitting Physician Clydia Llano, MD ION:GEXBM [name], MD  Subjective: Continues to mild intermittent hemoptysis  Assessment/Plan: Acute-on-chronic respiratory failure with hypoxia due to:  Acute on chronic diastolic heart failure CHF  Acute on CHRONIC OBSTRUCTIVE PULMONARY DISEASE, SEVERE - Improved  - Good response to Lasix infusion -cont supportive care with nebs/MDI and prednisone decreased to 30 mg  PNA -BAL +ve for ESBL E Coli -completed 9 days of levaquin-stop Levaquin and will place on Primaxin  Acute diastolic heart failure - Cardiology following - Continue with Lasix infusion - Slowly improving and getting more compensated. More than 10 L negative balance  Atrial fibrillation - Unfortunately off anti-coagulation given hemoptysis, per Dr. Delford Field, okay to resume anticoagulation in one week time - On metoprolol and Cardizem for rate control -Amiodarone started yesterday  Hemoptysis -? Etiology- provoked by xarelto-? Recurrence of underlying malignancy - Unfortunately a small amount of hemoptysis this recurred this morning -  Status post a bronchoscopy with no endobronchial lesions seen, only old clot seen on bronchoscopy - BAL +ve for ESBL E Coli-now on Primaxin  CKD (chronic kidney disease), stage III - Creatinine improving. Monitor renal function closely.  Leukocytosis - Multifactorial from possible pneumonia, on steroids as well - Monitor - Hypertension - Controlled- continue with current medication  Diabetes - Increase Lantus to 10 units, continue with SSI  CORONARY ARTERY DISEASE (nonobstructive disease 2006 catheterization)/Elevation of cardiac enzymes  -no CP and likely due to acute HF, demand ischemia from recent hypoxia and worsened renal function - Cardiology following  Cellulitis of left ankle  -has decreased rednessboth legs c/w  stasis dermatitis- resolved   NSCLC s/p RULectomy and adjuvant chemo in 2006 - Does have a new lung nodule-will need close followup as an outpatient  Disposition: Remain inpatient  DVT Prophylaxis:  SCD's  Code Status: Full code  Family Communication Spouse at bedside  Procedures:  None  CONSULTS:  cardiology and pulmonary/intensive care   MEDICATIONS: Scheduled Meds: . amiodarone  400 mg Oral BID  . arformoterol  15 mcg Nebulization BID  . atorvastatin  40 mg Oral QHS  . budesonide (PULMICORT) nebulizer solution  0.5 mg Nebulization BID  . citalopram  10 mg Oral Daily  . diltiazem  180 mg Oral BID  . ezetimibe  10 mg Oral Daily  . furosemide  120 mg Intravenous BID  . guaiFENesin  1,200 mg Oral BID  . hydrALAZINE  50 mg Oral BID  . imipenem-cilastatin  250 mg Intravenous Q6H  . insulin aspart  0-9 Units Subcutaneous TID WC  . insulin glargine  10 Units Subcutaneous QHS  . magnesium oxide  400 mg Oral Daily  . metoprolol succinate  25 mg Oral BID WC  . multivitamin  1 tablet Oral Daily  . pantoprazole  40 mg Oral Daily  . potassium chloride  40 mEq Oral Daily  . predniSONE  30 mg Oral Daily  . sodium chloride  3 mL Intravenous Q12H  . tiotropium  18 mcg Inhalation Daily   Continuous Infusions:  PRN Meds:.ALPRAZolam, levalbuterol, morphine injection, nitroGLYCERIN, ondansetron (ZOFRAN) IV, ondansetron, oxyCODONE, polyvinyl alcohol, sodium chloride, traZODone  Antibiotics: Anti-infectives   Start     Dose/Rate Route Frequency Ordered Stop   09/04/12 1200  imipenem-cilastatin (PRIMAXIN) 250 mg in sodium chloride 0.9 % 100 mL IVPB     250 mg 200 mL/hr over 30 Minutes Intravenous 4 times  per day 09/04/12 0856     08/29/12 1200  levofloxacin (LEVAQUIN) tablet 500 mg  Status:  Discontinued     500 mg Oral Every 48 hours 08/29/12 0957 09/04/12 1008   08/27/12 0900  vancomycin (VANCOCIN) 1,250 mg in sodium chloride 0.9 % 250 mL IVPB  Status:  Discontinued     1,250  mg 166.7 mL/hr over 90 Minutes Intravenous Every 24 hours 08/27/12 0826 08/28/12 1809   08/27/12 0900  piperacillin-tazobactam (ZOSYN) IVPB 3.375 g  Status:  Discontinued     3.375 g 12.5 mL/hr over 240 Minutes Intravenous 3 times per day 08/27/12 0826 08/28/12 1809   08/27/12 0600  levofloxacin (LEVAQUIN) IVPB 500 mg     500 mg 100 mL/hr over 60 Minutes Intravenous  Once 08/27/12 0554 08/27/12 0735       PHYSICAL EXAM: Vital signs in last 24 hours: Filed Vitals:   09/03/12 2247 09/03/12 2356 09/04/12 0621 09/04/12 0811  BP: 149/79  112/66 130/74  Pulse:   74 77  Temp:   97 F (36.1 C)   TempSrc:   Axillary   Resp:  20 18   Height:      Weight:      SpO2:   94%     Weight change:  Filed Weights   08/29/12 0630 08/31/12 0641 09/03/12 0626  Weight: 94.1 kg (207 lb 7.3 oz) 93.9 kg (207 lb 0.2 oz) 93 kg (205 lb 0.4 oz)   Body mass index is 32.1 kg/(m^2).   Gen Exam: Awake and alert with clear speech.   Neck: Supple, No JVD.   Chest: B/L Clear.   CVS: S1 S2 Regular, no murmurs.  Abdomen: soft, BS +, non tender, non distended.  Extremities: 1+ edema, lower extremities warm to touch. Neurologic: Non Focal.   Skin: No Rash.   Wounds: N/A.   Intake/Output from previous day:  Intake/Output Summary (Last 24 hours) at 09/04/12 1011 Last data filed at 09/04/12 0631  Gross per 24 hour  Intake    422 ml  Output   1200 ml  Net   -778 ml     LAB RESULTS: CBC  Recent Labs Lab 08/31/12 0500 09/01/12 0454 09/02/12 1245 09/03/12 0527 09/04/12 0515  WBC 10.5 14.7* 18.5* 19.3* 19.0*  HGB 11.5* 11.3* 12.1* 11.8* 10.9*  HCT 34.5* 33.9* 35.1* 35.2* 32.6*  PLT 347 362 369 387 306  MCV 92.2 91.6 91.2 91.7 91.1  MCH 30.7 30.5 31.4 30.7 30.4  MCHC 33.3 33.3 34.5 33.5 33.4  RDW 15.8* 15.8* 15.5 15.4 15.3  LYMPHSABS 0.8  --  0.8  --   --   MONOABS 0.2  --  1.2*  --   --   EOSABS 0.0  --  0.0  --   --   BASOSABS 0.0  --  0.0  --   --     Chemistries   Recent Labs Lab  08/29/12 0400  08/31/12 0500 09/01/12 0454 09/02/12 1245 09/03/12 0527 09/04/12 0515  NA 137  < > 138 136 134* 132* 137  K 3.6  < > 3.4* 3.6 3.7 3.2* 3.2*  CL 95*  < > 96 96 92* 92* 100  CO2 27  < > 30 30 29 28 25   GLUCOSE 146*  < > 198* 207* 249* 187* 237*  BUN 47*  < > 57* 70* 80* 81* 72*  CREATININE 2.44*  < > 2.26* 2.33* 2.54* 2.39* 2.09*  CALCIUM 8.5  < > 8.9 8.9 8.5  8.3* 7.7*  MG 2.0  --   --   --   --   --   --   < > = values in this interval not displayed.  CBG:  Recent Labs Lab 09/03/12 0742 09/03/12 1203 09/03/12 1725 09/03/12 2144 09/04/12 0736  GLUCAP 158* 166* 217* 333* 200*    GFR Estimated Creatinine Clearance: 32.2 ml/min (by C-G formula based on Cr of 2.09).  Coagulation profile No results found for this basename: INR, PROTIME,  in the last 168 hours  Cardiac Enzymes No results found for this basename: CK, CKMB, TROPONINI, MYOGLOBIN,  in the last 168 hours  No components found with this basename: POCBNP,  No results found for this basename: DDIMER,  in the last 72 hours No results found for this basename: HGBA1C,  in the last 72 hours No results found for this basename: CHOL, HDL, LDLCALC, TRIG, CHOLHDL, LDLDIRECT,  in the last 72 hours No results found for this basename: TSH, T4TOTAL, FREET3, T3FREE, THYROIDAB,  in the last 72 hours No results found for this basename: VITAMINB12, FOLATE, FERRITIN, TIBC, IRON, RETICCTPCT,  in the last 72 hours No results found for this basename: LIPASE, AMYLASE,  in the last 72 hours  Urine Studies No results found for this basename: UACOL, UAPR, USPG, UPH, UTP, UGL, UKET, UBIL, UHGB, UNIT, UROB, ULEU, UEPI, UWBC, URBC, UBAC, CAST, CRYS, UCOM, BILUA,  in the last 72 hours  MICROBIOLOGY: Recent Results (from the past 240 hour(s))  CULTURE, BLOOD (ROUTINE X 2)     Status: None   Collection Time    08/27/12  8:30 AM      Result Value Range Status   Specimen Description BLOOD LEFT HAND   Final   Special Requests  BOTTLES DRAWN AEROBIC ONLY 5CC   Final   Culture  Setup Time 08/27/2012 14:16   Final   Culture NO GROWTH 5 DAYS   Final   Report Status 09/02/2012 FINAL   Final  MRSA PCR SCREENING     Status: Abnormal   Collection Time    08/27/12  8:38 AM      Result Value Range Status   MRSA by PCR POSITIVE (*) NEGATIVE Final   Comment:            The GeneXpert MRSA Assay (FDA     approved for NASAL specimens     only), is one component of a     comprehensive MRSA colonization     surveillance program. It is not     intended to diagnose MRSA     infection nor to guide or     monitor treatment for     MRSA infections.     RESULT CALLED TO, READ BACK BY AND VERIFIED WITH:     Esperanza Richters AT 1148 08/27/12 BY K BARR  CULTURE, BLOOD (ROUTINE X 2)     Status: None   Collection Time    08/27/12  8:40 AM      Result Value Range Status   Specimen Description BLOOD LEFT HAND   Final   Special Requests BOTTLES DRAWN AEROBIC ONLY 4CC   Final   Culture  Setup Time 08/27/2012 14:16   Final   Culture NO GROWTH 5 DAYS   Final   Report Status 09/02/2012 FINAL   Final  URINE CULTURE     Status: None   Collection Time    08/27/12  8:28 PM      Result Value Range Status  Specimen Description URINE, CLEAN CATCH   Final   Special Requests NONE   Final   Culture  Setup Time 08/28/2012 09:51   Final   Colony Count NO GROWTH   Final   Culture NO GROWTH   Final   Report Status 08/29/2012 FINAL   Final  CULTURE, EXPECTORATED SPUTUM-ASSESSMENT     Status: None   Collection Time    08/31/12  8:38 AM      Result Value Range Status   Specimen Description SPUTUM   Final   Special Requests NONE   Final   Sputum evaluation     Final   Value: MICROSCOPIC FINDINGS SUGGEST THAT THIS SPECIMEN IS NOT REPRESENTATIVE OF LOWER RESPIRATORY SECRETIONS. PLEASE RECOLLECT.     CALLED TO Mackie Pai 08/31/12 0913 BY K SCHULTZ   Report Status 08/31/2012 FINAL   Final  CULTURE, EXPECTORATED SPUTUM-ASSESSMENT     Status: None    Collection Time    08/31/12  1:10 PM      Result Value Range Status   Specimen Description SPUTUM   Final   Special Requests NONE   Final   Sputum evaluation     Final   Value: THIS SPECIMEN IS ACCEPTABLE. RESPIRATORY CULTURE REPORT TO FOLLOW.   Report Status 08/31/2012 FINAL   Final  CULTURE, RESPIRATORY (NON-EXPECTORATED)     Status: None   Collection Time    08/31/12  1:10 PM      Result Value Range Status   Specimen Description SPUTUM   Final   Special Requests NONE   Final   Gram Stain     Final   Value: RARE WBC PRESENT, PREDOMINANTLY PMN     RARE SQUAMOUS EPITHELIAL CELLS PRESENT     NO ORGANISMS SEEN   Culture NORMAL OROPHARYNGEAL FLORA   Final   Report Status 09/02/2012 FINAL   Final  AFB CULTURE WITH SMEAR     Status: None   Collection Time    09/01/12 11:19 AM      Result Value Range Status   Specimen Description BRONCHIAL WASHINGS   Final   Special Requests NONE   Final   ACID FAST SMEAR NO ACID FAST BACILLI SEEN   Final   Culture     Final   Value: CULTURE WILL BE EXAMINED FOR 6 WEEKS BEFORE ISSUING A FINAL REPORT   Report Status PENDING   Incomplete  FUNGUS CULTURE W SMEAR     Status: None   Collection Time    09/01/12 11:19 AM      Result Value Range Status   Specimen Description BRONCHIAL WASHINGS   Final   Special Requests NONE   Final   Fungal Smear FEW HYPHAL ELEMENTS   Final   Culture CANDIDA ALBICANS   Final   Report Status PENDING   Incomplete  LEGIONELLA CULTURE     Status: None   Collection Time    09/01/12 11:19 AM      Result Value Range Status   Specimen Description BRONCHIAL WASHINGS   Final   Special Requests NONE   Final   Culture     Final   Value: NO LEGIONELLA ISOLATED, CULTURE IN PROGRESS FOR 5 DAYS   Report Status PENDING   Incomplete  CULTURE, RESPIRATORY (NON-EXPECTORATED)     Status: None   Collection Time    09/01/12 11:19 AM      Result Value Range Status   Specimen Description BRONCHIAL WASHINGS   Final   Special  Requests NONE   Final   Gram Stain     Final   Value: RARE WBC PRESENT, PREDOMINANTLY PMN     RARE SQUAMOUS EPITHELIAL CELLS PRESENT     RARE YEAST     RARE GRAM NEGATIVE RODS   Culture     Final   Value: FEW ESCHERICHIA COLI     Note: Confirmed Extended Spectrum Beta-Lactamase Producer (ESBL) CRITICAL RESULT CALLED TO, READ BACK BY AND VERIFIED WITH: JENNIFER Z@7 :45AM ON 09/04/12 BY DANTS     FEW CANDIDA ALBICANS   Report Status 09/04/2012 FINAL   Final   Organism ID, Bacteria ESCHERICHIA COLI   Final    RADIOLOGY STUDIES/RESULTS: Dg Chest 2 View  09/01/2012   *RADIOLOGY REPORT*  Clinical Data: Shortness of breath.  CHEST - 2 VIEW  Comparison: 08/31/2012.  Findings: The left subclavian Port-A-Cath tip it is most likely in the mid SVC at the level of the carina.  Stable pacer wires.  The heart is enlarged but unchanged.  Persistent bilateral infiltrates and effusions.  IMPRESSION:  1.  The Port-A-Cath tip appears to be in the mid SVC. 2.  Persistent bilateral infiltrates and small effusions.   Original Report Authenticated By: Rudie Meyer, [name]   Ct Chest Wo Contrast  08/30/2012   *RADIOLOGY REPORT*  Clinical Data: Hemoptysis.  Pneumonia versus edema.  CT CHEST WITHOUT CONTRAST  Technique:  Multidetector CT imaging of the chest was performed following the standard protocol without IV contrast.  Comparison: Chest radiograph 08/30/2012 and chest CT 02/05/2012  Findings: Heart is enlarged.  Cardiomegaly appears stable. Stable prominent fat density, consistent with lipoma, between the left and right atria.  Dual lead pacer is present.  There is heavy coronary artery atherosclerotic calcification of all three coronary arteries.  Aortic valvular calcifications are present.  A left IJ central venous catheter terminates in the superior vena cava. Thyroid gland and thoracic inlet appear within normal limits.  Heavy atherosclerotic calcification of the thoracic aorta, without aneurysm.  Several mediastinal  lymph nodes are visualized, and within normal limits for size.  No pathologically enlarged lymph nodes are seen in the thorax.  Evaluation for the hilar lymph nodes is somewhat limited without intravenous contrast.  Small bilateral pleural effusions are present.  Multilevel degenerative changes of the thoracic spine.  Multiple benign hemangiomas in the thoracic spine vertebral bodies.  No acute or suspicious osseous abnormality.  No acute findings are identified in the abdomen.  Surgical clips are seen in the left upper quadrant.  There are postsurgical changes of the right lower lobectomy.  There is a background of centrilobular emphysema.  Stable scarring in the right upper lobe anteriorly.  No definite intralobular septal thickening.  There is markedly asymmetric airspace disease in the inferior and posterior aspect of the right upper lobe, where there are areas of consolidation.  There are ground-glass opacities which are new compared to prior chest CT in the left upper lobe, left lower lobe, and right middle lobe.  5 mm pulmonary nodule in the left upper lobe peripherally on image #27 of the lung windows is new.  IMPRESSION:  1.  Markedly asymmetric bilateral airspace disease.  Airspace disease is most prominent, with areas of consolidation, in the posterior and inferior right upper lobe ( the patient has a history of right lower lobectomy).  Findings in the right upper lobe are favored to be due to pneumonia or aspiration. 2.  New ground-glass opacities in the left upper lobe and left lower  lobe may be due to early infection.  Underlying mild pulmonary edema cannot be excluded in this patient with cardiomegaly and small bilateral pleural effusions. 3.  New 5 mm pulmonary nodule left upper lobe. If the patient is at high risk for bronchogenic carcinoma, follow-up chest CT at 6-12 months is recommended.  If the patient is at low risk for bronchogenic carcinoma, follow-up chest CT at 12 months is recommended.   This recommendation follows the consensus statement: Guidelines for Management of Small Pulmonary Nodules Detected on CT Scans: A Statement from the Fleischner Society as published in Radiology 2005; 237:395-400. 4.  Cardiomegaly with three-vessel coronary artery disease and dual lead cardiac pacer. 5.  Small bilateral pleural effusions 6.  Aortic valve calcifications. This finding raises the possibility of aortic valvular stenosis.  .   Original Report Authenticated By: Britta Mccreedy, [name]   Dg Chest Port 1 View  08/31/2012   *RADIOLOGY REPORT*  Clinical Data: Follow-up edema  PORTABLE CHEST - 1 VIEW  Comparison: Yesterday  Findings: The tip of the left subclavian Port-A-Cath has changed its orientation.  Based on a single view, it is likely in the azygos vein.  Right subclavian dual lead pacemaker device and leads are stable and intact.  Bilateral pleural effusions left greater than right are stable.  Diffuse edema is stable. Cardiomegaly.  IMPRESSION: Left subclavian Port-A-Cath tip has changed its position and is likely in the azygos vein.  This can be confirmed with a lateral view.  Stable airspace disease and pleural effusions.   Original Report Authenticated By: Jolaine Click, [name]   Dg Chest Port 1 View  08/30/2012   *RADIOLOGY REPORT*  Clinical Data: Respiratory failure.  PORTABLE CHEST - 1 VIEW  Comparison: 08/29/2012 and 08/27/2012  Findings: Prominent interstitial markings could represent underlying edema.  There are persistent interstitial and airspace densities in the right mid and lower lung region.  Heart size remains enlarged.  Again noted is a right dual lead cardiac pacemaker.  Left subclavian Port-A-Cath in the SVC region.  Aortic arch is heavily calcified.  IMPRESSION:  Prominent interstitial markings may represent underlying edema. Again noted are increased densities in the right mid and lower lung region which could represent asymmetric edema but cannot exclude an infectious etiology.    Original Report Authenticated By: Richarda Overlie, [name]   Dg Chest Port 1 View  08/29/2012   *RADIOLOGY REPORT*  Clinical Data: Follow-up CHF  PORTABLE CHEST - 1 VIEW  Comparison: Prior chest x-ray 08/27/2012  Findings: Stable position of left subclavian central venous catheter with the tip in the mid superior vena cava.  Right subclavian approach cardiac rhythm maintenance device with leads in the right atrium and right ventricle.  Slightly improved inspiratory volumes with clearing in the right base.  Stable elevation of the left hemidiaphragm resulting in blunting of the left costophrenic angle.  Decreasing interstitial edema with the residual asymmetric patchy opacity in the right mid and lower lobe. Small residual right pleural effusion.  Stable cardiomegaly. Aortic atherosclerosis again noted.  No pneumothorax.  IMPRESSION:  1.  Decreasing pulmonary edema with improved inspiratory volumes and clearing in the right base. 2.  Residual asymmetric patchy opacity in the right mid and lower lung may reflect residual asymmetric alveolar edema, or pneumonia. 3.  Persistent small right pleural effusion, chronic elevation of the left hemidiaphragm and cardiomegaly.   Original Report Authenticated By: Malachy Moan, [name]   Dg Chest Portable 1 View  08/27/2012   *RADIOLOGY REPORT*  Clinical  Data: Shortness of breath.  History of lung cancer post lobectomy.  PORTABLE CHEST - 1 VIEW  Comparison: 02/02/2012  Findings: Shallow inspiration.  Cardiac enlargement with pulmonary vascular congestion and perihilar edema.  Bilateral pleural effusions.  Atelectasis in the lung bases.  No pneumothorax. Changes have developed since the previous study.  Postoperative changes in the right chest.  Stable appearance of cardiac pacemaker and left central venous catheter.  Calcified and tortuous aorta.  IMPRESSION: Interval development of cardiac enlargement, pulmonary vascular congestion, perihilar edema, and bilateral pleural effusions.    Original Report Authenticated By: Burman Nieves, [name]    Jeoffrey Massed, MD  [facility] Pager:[phone]  If 7PM-7AM, please contact night-coverage [email or web address] Password [email or web address] 09/04/2012, 10:11 AM   LOS: 8 days

## 2012-09-04 NOTE — Progress Notes (Signed)
Met with patient and wife at bedside to explain my role.  Both expressed appreciation for the services received at home so far.  No current needs expressed regarding return to home.  Provided wife with my contact information.  Disposition is pending per attending MD notation.  Will continue to monitor.  Of note, [facility] Care Management services does not replace or interfere with any services that are arranged by inpatient case management or social work.  For additional questions or referrals please contact Anibal Henderson BSN RN [facility] Liaison at [phone].

## 2012-09-04 NOTE — Progress Notes (Signed)
Inpatient Diabetes Program Recommendations  AACE/ADA: New Consensus Statement on Inpatient Glycemic Control (2013)  Target Ranges:  Prepandial:   less than 140 mg/dL      Peak postprandial:   less than 180 mg/dL (1-2 hours)      Critically ill patients:  140 - 180 mg/dL     Results for [name] Stevenson, [name] Stevenson. (MRN [id number]) as of 09/04/2012 11:10  Ref. Range 09/03/2012 07:42 09/03/2012 12:03 09/03/2012 17:25 09/03/2012 21:44  Glucose-Capillary Latest Range: 70-99 mg/dL 409 (H) 811 (H) 914 (H) 333 (H)    Patient receiving PO Prednisone.  Having elevated postprandial CBGs.  Eating 100% of meals.  MD- Please add scheduled Novolog meal coverage- Novolog 3 units tid with meals (while patient on Prednisone)  Will follow. Ambrose Finland RN, MSN, CDE Diabetes Coordinator Inpatient Diabetes Program [phone]

## 2012-09-04 NOTE — Progress Notes (Signed)
CRITICAL VALUE ALERT  Critical value received:  Bronchial Washing + ESBL E. coli  Date of notification:  09/04/12  Time of notification:  0747  Critical value read back:yes  Nurse who received alert:  Jacquiline Doe, RN  MD notified (1st page):  Dr. Jerral Ralph  Time of first page:  [phone]  Responding MD:  Dr. Lucrezia Starch  Time MD responded:  [phone]

## 2012-09-04 NOTE — Progress Notes (Signed)
PULMONARY  / CRITICAL CARE MEDICINE  Name: [name] AHLGREN Sr. MRN: [id number] DOB: [birth year]    ADMISSION DATE:  08/27/2012 CONSULTATION DATE:  08/28/2012  REFERRING MD :  Berton Mount PRIMARY SERVICE:  [facility]  CHIEF COMPLAINT:  Short of breath  BRIEF PATIENT DESCRIPTION:  79 yo male former smoker presented with severe dyspnea from acute pulmonary edema, acute diastolic dysfunction, and new onset A fib with RVR.  He is followed by Dr. [name] for severe COPD, chronic respiratory failure on home oxygen, OSA on BiPAP, and NSCLC s/p RULectomy and adjuvant chemo in 2006.  PCCM consulted to assist with respiratory management, and to assist determination whether he is candidate for cardioversion.  He was recently treated as outpt for Lt ankle cellulitis.  SIGNIFICANT EVENTS: 6/12> Admit with A fib RVR, acute diastolic heart failure 6/14> Hemoptysis on Xarelto=> stopped, placed on Levaquin/ Solumedrol. 6/15> wife reports similar lumps of bloody phlegm thru the night; seems diminished this AM, persist rhonchi/congestion.  STUDIES:  6/12 Echo >> mod LVH, EF 65 to 70%, mild/mod LA and RA dilation 615 CT chest>>>Markedly asymmetric bilateral airspace disease. Airspace disease is most prominent, with areas of consolidation, in the posterior and inferior right upper lobe ( the patient has a history of right lower lobectomy). Findings in the right upper lobe are favored to be due to pneumonia or aspiration. 2. New ground-glass opacities in the left upper lobe and left lower lobe may be due to early infection. Underlying mild pulmonary edema cannot be excluded in this patient with cardiomegaly and small bilateral pleural effusions. 3. New 5 mm pulmonary nodule left upper lobe. 4. Cardiomegaly with three-vessel coronary artery disease and dual lead cardiac pacer. 5. Small bilateral pleural effusions 6. Aortic valve calcifications. This finding raises the possibility of aortic valvular stenosis.   LINES /  TUBES: PIV  CULTURES: Blood 6/12 >> MRSA screen 6/12 >> POSITIVE BAL 6/17>>> rare GNR, rare yeast>>>E Coli and candida  ANTIBIOTICS: Doxycycline 6/10 >> 6/12 Vancomycin 6/12 >>6/14 Zosyn 6/12 >> 6/14 Levaquin 6/14(e coli in lung)>>>  Subjective/Overnight:  No further hemoptysis   VITAL SIGNS: Temp:  [97 F (36.1 C)-98.3 F (36.8 C)] 98.1 F (36.7 C) (06/20 1344) Pulse Rate:  [74-81] 81 (06/20 1344) Resp:  [18-22] 18 (06/20 1344) BP: (112-154)/(66-79) 154/70 mmHg (06/20 1344) SpO2:  [92 %-96 %] 92 % (06/20 1344) 4 liters [place]  PHYSICAL EXAMINATION: General:  Chronically ill appearing male, No distress, speaks in full sentences, using accessory muscles Neuro:  Alert, follows commands, normal strength, seems anxious HEENT:  Mm moist, no JVD Cardiovascular:  Irregular, tachycardic, no murmur Lungs:  Decreased breath sounds, basilar rales, exp wheeze R>L but improved Abdomen:  Soft, non tender, + bowel sounds Musculoskeletal:  2+ leg edema R>L    Recent Labs Lab 09/02/12 1245 09/03/12 0527 09/04/12 0515  NA 134* 132* 137  K 3.7 3.2* 3.2*  CL 92* 92* 100  CO2 29 28 25   BUN 80* 81* 72*  CREATININE 2.54* 2.39* 2.09*  GLUCOSE 249* 187* 237*    Recent Labs Lab 09/02/12 1245 09/03/12 0527 09/04/12 0515  HGB 12.1* 11.8* 10.9*  HCT 35.1* 35.2* 32.6*  WBC 18.5* 19.3* 19.0*  PLT 369 387 306   No results found.  BNP (last 3 results)  Recent Labs  08/27/12 0350 08/28/12 0500 08/31/12 0500  PROBNP 3385.0* 6086.0* 7566.0*     ASSESSMENT / PLAN:  Hemoptysis in setting of COPD exac and anticoagulation for new AFib. ?r/t new  pulm nodule as below.  Much improved. No hemoptysis since 6/17.   Plan: -Anticoag (Xarelto) stopped for now -- ??when to resume with continued AFib. Poss consider apixaban when restart anticoags -Ok to do dccv 6/25 and give anticoags on 6/25 -continue COPD Rx as below... -levaquin changed to imipenem  >>E coli on BAL is ESBL+    New  LUL 5mm pulm nodule.  No Endobronchial lesion seen on FOB. (Previously followed by Dr Welton Flakes for oncology) PLAN: Fob cytology neg for CA F/u nodule later with imaging    Acute on chronic respiratory failure 2nd to acute pulmonary edema from acute diastolic heart failure and new onset A fib with RVR.  This in setting of severe COPD and OSA.  Now c/b hemoptysis.  E coli PNA (ESBL+) Plan: -Cont oxygen with goal SpO2 > 90% -taper Po pred  -BiPAP qhs and prn during the day -negative fluid balance per cardiology and primary team - caution with renal insuff.  -continue GFN 1200mg  Bid -cont brovana/pulmicort nebulizer BID with prn xopenex nebulizer >> d/c dulera while on this regimen -cont cautious diuresis per cards  -agree with changing abx to cover e coli (6/20)   Lt ankle cellulitis. Plan: -Abx  Cont  levaquin only ok for ecoli   Renal Insuffic:  Improving Plan: -lasix titration per [facility] and Cardiology  Insomnia. Plan: -prn xanax, trazodone  PCCM will check back on him Monday. Please call if we can help through the weekend.  Levy Pupa, MD, PhD 09/04/2012, 3:12 PM [facility] [phone] or if no answer [phone]

## 2012-09-05 LAB — BASIC METABOLIC PANEL
CO2: 28 mEq/L (ref 19–32)
Chloride: 99 mEq/L (ref 96–112)
Creatinine, Ser: 2.14 mg/dL — ABNORMAL HIGH (ref 0.50–1.35)
GFR calc Af Amer: 33 mL/min — ABNORMAL LOW (ref >90)
Potassium: 3.5 mEq/L (ref 3.5–5.1)

## 2012-09-05 LAB — CBC
HCT: 36.1 % — ABNORMAL LOW (ref 39.0–52.0)
Hemoglobin: 12.4 g/dL — ABNORMAL LOW (ref 13.0–17.0)
RDW: 15.2 % (ref 11.5–15.5)
WBC: 19 10*3/uL — ABNORMAL HIGH (ref 4.0–10.5)

## 2012-09-05 LAB — DIFFERENTIAL
Basophils Absolute: 0 10*3/uL (ref 0.0–0.1)
Lymphocytes Relative: 7 % — ABNORMAL LOW (ref 12–46)
Monocytes Absolute: 1.3 10*3/uL — ABNORMAL HIGH (ref 0.1–1.0)
Neutro Abs: 16.4 10*3/uL — ABNORMAL HIGH (ref 1.7–7.7)
Neutrophils Relative %: 86 % — ABNORMAL HIGH (ref 43–77)

## 2012-09-05 LAB — GLUCOSE, CAPILLARY
Glucose-Capillary: 122 mg/dL — ABNORMAL HIGH (ref 70–99)
Glucose-Capillary: 107 mg/dL — ABNORMAL HIGH (ref 70–99)
Glucose-Capillary: 301 mg/dL — ABNORMAL HIGH (ref 70–99)

## 2012-09-05 MED ORDER — PREDNISONE 20 MG PO TABS
20.0000 mg | ORAL_TABLET | Freq: Every day | ORAL | Status: DC
  Administered 2012-09-06 – 2012-09-14 (×9): 20 mg via ORAL
  Filled 2012-09-05 (×10): qty 1

## 2012-09-05 MED ORDER — DIPHENHYDRAMINE HCL 25 MG PO CAPS
25.0000 mg | ORAL_CAPSULE | Freq: Four times a day (QID) | ORAL | Status: DC
  Administered 2012-09-05 – 2012-09-16 (×41): 25 mg via ORAL
  Filled 2012-09-05 (×54): qty 1

## 2012-09-05 NOTE — Progress Notes (Signed)
Pt was placed on CPAP of 14 with a full face mask. 4 LPM oxygen bleed in. Pt is resting comfortably. Vitals are stable.  RT made pt aware to call anytime if he had any questions or problems. 

## 2012-09-05 NOTE — Progress Notes (Signed)
PATIENT DETAILS Name: [name] GROTZ Sr. Age: 79 y.o. Sex: male Date of Birth: 11-26-1932 Admit Date: 08/27/2012 Admitting Physician [name] Llano, MD [id number] [name] Ruiz, MD  Subjective: No hemoptysis today  Assessment/Plan: Acute-on-chronic respiratory failure with hypoxia due to:  Acute on chronic diastolic heart failure CHF  Acute on CHRONIC OBSTRUCTIVE PULMONARY DISEASE, SEVERE - Improved  - Good response to Lasix infusion -cont supportive care with nebs/MDI and prednisone decreased to 20 mg  PNA -BAL +ve for ESBL E Coli -completed 9 days of levaquin-stop Levaquin, now on Primaxin-Day 2  Acute diastolic heart failure - Cardiology following - Continue with Lasix infusion - Slowly improving and getting more compensated. More than 13 L negative balance  Atrial fibrillation - Unfortunately off anti-coagulation given hemoptysis, per Dr. Delford Stevenson, okay to resume anticoagulation in one week time - On metoprolol and Cardizem for rate control -Amiodarone started yesterday  Hemoptysis -? Etiology- provoked by xarelto-? Recurrence of underlying malignancy - Seems to have almost resolved -  Status post a bronchoscopy with no endobronchial lesions seen, only old clot seen on bronchoscopy - BAL +ve for ESBL E Coli-now on Primaxin  CKD (chronic kidney disease), stage III - Creatinine improving. Monitor renal function closely.  Leukocytosis - Multifactorial from possible pneumonia, on steroids as well - Monitor - Hypertension - Controlled- continue with current medication  Diabetes - Increase Lantus to 10 units, continue with SSI  CORONARY ARTERY DISEASE (nonobstructive disease 2006 catheterization)/Elevation of cardiac enzymes  -no CP and likely due to acute HF, demand ischemia from recent hypoxia and worsened renal function - Cardiology following  Cellulitis of left ankle  -has decreased rednessboth legs c/w stasis dermatitis- resolved   NSCLC s/p RULectomy and adjuvant chemo  in 2006 - Does have a new lung nodule-will need close followup as an outpatient  Disposition: Remain inpatient  DVT Prophylaxis:  SCD's  Code Status: Full code  Family Communication Spouse at bedside  Procedures:  None  CONSULTS:  cardiology and pulmonary/intensive care   MEDICATIONS: Scheduled Meds: . amiodarone  400 mg Oral BID  . arformoterol  15 mcg Nebulization BID  . atorvastatin  40 mg Oral QHS  . budesonide (PULMICORT) nebulizer solution  0.5 mg Nebulization BID  . citalopram  10 mg Oral Daily  . diltiazem  180 mg Oral BID  . ezetimibe  10 mg Oral Daily  . furosemide  120 mg Intravenous BID  . guaiFENesin  1,200 mg Oral BID  . hydrALAZINE  50 mg Oral BID  . imipenem-cilastatin  250 mg Intravenous Q6H  . insulin aspart  0-9 Units Subcutaneous TID WC  . insulin glargine  10 Units Subcutaneous QHS  . magnesium oxide  400 mg Oral Daily  . metoprolol succinate  25 mg Oral BID WC  . multivitamin  1 tablet Oral Daily  . pantoprazole  40 mg Oral Daily  . potassium chloride  40 mEq Oral BID  . [START ON 09/06/2012] predniSONE  20 mg Oral Daily  . sodium chloride  3 mL Intravenous Q12H  . tiotropium  18 mcg Inhalation Daily   Continuous Infusions:  PRN Meds:.ALPRAZolam, levalbuterol, morphine injection, nitroGLYCERIN, ondansetron (ZOFRAN) IV, ondansetron, oxyCODONE, polyvinyl alcohol, sodium chloride, traZODone  Antibiotics: Anti-infectives   Start     Dose/Rate Route Frequency Ordered Stop   09/04/12 1200  imipenem-cilastatin (PRIMAXIN) 250 mg in sodium chloride 0.9 % 100 mL IVPB     250 mg 200 mL/hr over 30 Minutes Intravenous 4 times per day 09/04/12 8469  08/29/12 1200  levofloxacin (LEVAQUIN) tablet 500 mg  Status:  Discontinued     500 mg Oral Every 48 hours 08/29/12 0957 09/04/12 1008   08/27/12 0900  vancomycin (VANCOCIN) 1,250 mg in sodium chloride 0.9 % 250 mL IVPB  Status:  Discontinued     1,250 mg 166.7 mL/hr over 90 Minutes Intravenous Every  24 hours 08/27/12 0826 08/28/12 1809   08/27/12 0900  piperacillin-tazobactam (ZOSYN) IVPB 3.375 g  Status:  Discontinued     3.375 g 12.5 mL/hr over 240 Minutes Intravenous 3 times per day 08/27/12 0826 08/28/12 1809   08/27/12 0600  levofloxacin (LEVAQUIN) IVPB 500 mg     500 mg 100 mL/hr over 60 Minutes Intravenous  Once 08/27/12 0554 08/27/12 0735       PHYSICAL EXAM: Vital signs in last 24 hours: Filed Vitals:   09/04/12 2100 09/05/12 0432 09/05/12 0500 09/05/12 1352  BP: 165/79  158/73 111/71  Pulse: 80  77 77  Temp: 97.9 F (36.6 C)  97.8 F (36.6 C) 98.3 F (36.8 C)  TempSrc: Oral  Oral Oral  Resp: 20  20 20   Height:      Weight:  94.4 kg (208 lb 1.8 oz)    SpO2: 95%  95% 95%    Weight change:  Filed Weights   08/31/12 0641 09/03/12 0626 09/05/12 0432  Weight: 93.9 kg (207 lb 0.2 oz) 93 kg (205 lb 0.4 oz) 94.4 kg (208 lb 1.8 oz)   Body mass index is 32.59 kg/(m^2).   Gen Exam: Awake and alert with clear speech.   Neck: Supple, No JVD.   Chest: B/L Clear.   CVS: S1 S2 Regular, no murmurs.  Abdomen: soft, BS +, non tender, non distended.  Extremities: 1+ edema, lower extremities warm to touch. Neurologic: Non Focal.   Skin: No Rash.   Wounds: N/A.   Intake/Output from previous day:  Intake/Output Summary (Last 24 hours) at 09/05/12 1401 Last data filed at 09/05/12 1353  Gross per 24 hour  Intake    580 ml  Output   3200 ml  Net  -2620 ml     LAB RESULTS: CBC  Recent Labs Lab 08/31/12 0500 09/01/12 0454 09/02/12 1245 09/03/12 0527 09/04/12 0515 09/05/12 0500  WBC 10.5 14.7* 18.5* 19.3* 19.0* 19.0*  HGB 11.5* 11.3* 12.1* 11.8* 10.9* 12.4*  HCT 34.5* 33.9* 35.1* 35.2* 32.6* 36.1*  PLT 347 362 369 387 306 386  MCV 92.2 91.6 91.2 91.7 91.1 91.9  MCH 30.7 30.5 31.4 30.7 30.4 31.6  MCHC 33.3 33.3 34.5 33.5 33.4 34.3  RDW 15.8* 15.8* 15.5 15.4 15.3 15.2  LYMPHSABS 0.8  --  0.8  --   --  1.4  MONOABS 0.2  --  1.2*  --   --  1.3*  EOSABS 0.0   --  0.0  --   --  0.0  BASOSABS 0.0  --  0.0  --   --  0.0    Chemistries   Recent Labs Lab 09/01/12 0454 09/02/12 1245 09/03/12 0527 09/04/12 0515 09/05/12 0500  NA 136 134* 132* 137 139  K 3.6 3.7 3.2* 3.2* 3.5  CL 96 92* 92* 100 99  CO2 30 29 28 25 28   GLUCOSE 207* 249* 187* 237* 146*  BUN 70* 80* 81* 72* 67*  CREATININE 2.33* 2.54* 2.39* 2.09* 2.14*  CALCIUM 8.9 8.5 8.3* 7.7* 8.2*    CBG:  Recent Labs Lab 09/04/12 1216 09/04/12 1712 09/04/12 2112 09/05/12 0751 09/05/12  1202  GLUCAP 134* 237* 333* 122* 107*    GFR Estimated Creatinine Clearance: 31.6 ml/min (by C-G formula based on Cr of 2.14).  Coagulation profile No results found for this basename: INR, PROTIME,  in the last 168 hours  Cardiac Enzymes No results found for this basename: CK, CKMB, TROPONINI, MYOGLOBIN,  in the last 168 hours  No components found with this basename: POCBNP,  No results found for this basename: DDIMER,  in the last 72 hours No results found for this basename: HGBA1C,  in the last 72 hours No results found for this basename: CHOL, HDL, LDLCALC, TRIG, CHOLHDL, LDLDIRECT,  in the last 72 hours No results found for this basename: TSH, T4TOTAL, FREET3, T3FREE, THYROIDAB,  in the last 72 hours No results found for this basename: VITAMINB12, FOLATE, FERRITIN, TIBC, IRON, RETICCTPCT,  in the last 72 hours No results found for this basename: LIPASE, AMYLASE,  in the last 72 hours  Urine Studies No results found for this basename: UACOL, UAPR, USPG, UPH, UTP, UGL, UKET, UBIL, UHGB, UNIT, UROB, ULEU, UEPI, UWBC, URBC, UBAC, CAST, CRYS, UCOM, BILUA,  in the last 72 hours  MICROBIOLOGY: Recent Results (from the past 240 hour(s))  CULTURE, BLOOD (ROUTINE X 2)     Status: None   Collection Time    08/27/12  8:30 AM      Result Value Range Status   Specimen Description BLOOD LEFT HAND   Final   Special Requests BOTTLES DRAWN AEROBIC ONLY 5CC   Final   Culture  Setup Time 08/27/2012  14:16   Final   Culture NO GROWTH 5 DAYS   Final   Report Status 09/02/2012 FINAL   Final  MRSA PCR SCREENING     Status: Abnormal   Collection Time    08/27/12  8:38 AM      Result Value Range Status   MRSA by PCR POSITIVE (*) NEGATIVE Final   Comment:            The GeneXpert MRSA Assay (FDA     approved for NASAL specimens     only), is one component of a     comprehensive MRSA colonization     surveillance program. It is not     intended to diagnose MRSA     infection nor to guide or     monitor treatment for     MRSA infections.     RESULT CALLED TO, READ BACK BY AND VERIFIED WITH:     Esperanza Richters AT 1148 08/27/12 BY K BARR  CULTURE, BLOOD (ROUTINE X 2)     Status: None   Collection Time    08/27/12  8:40 AM      Result Value Range Status   Specimen Description BLOOD LEFT HAND   Final   Special Requests BOTTLES DRAWN AEROBIC ONLY 4CC   Final   Culture  Setup Time 08/27/2012 14:16   Final   Culture NO GROWTH 5 DAYS   Final   Report Status 09/02/2012 FINAL   Final  URINE CULTURE     Status: None   Collection Time    08/27/12  8:28 PM      Result Value Range Status   Specimen Description URINE, CLEAN CATCH   Final   Special Requests NONE   Final   Culture  Setup Time 08/28/2012 09:51   Final   Colony Count NO GROWTH   Final   Culture NO GROWTH   Final  Report Status 08/29/2012 FINAL   Final  CULTURE, EXPECTORATED SPUTUM-ASSESSMENT     Status: None   Collection Time    08/31/12  8:38 AM      Result Value Range Status   Specimen Description SPUTUM   Final   Special Requests NONE   Final   Sputum evaluation     Final   Value: MICROSCOPIC FINDINGS SUGGEST THAT THIS SPECIMEN IS NOT REPRESENTATIVE OF LOWER RESPIRATORY SECRETIONS. PLEASE RECOLLECT.     CALLED TO Mackie Pai 08/31/12 0913 BY K SCHULTZ   Report Status 08/31/2012 FINAL   Final  CULTURE, EXPECTORATED SPUTUM-ASSESSMENT     Status: None   Collection Time    08/31/12  1:10 PM      Result Value Range Status    Specimen Description SPUTUM   Final   Special Requests NONE   Final   Sputum evaluation     Final   Value: THIS SPECIMEN IS ACCEPTABLE. RESPIRATORY CULTURE REPORT TO FOLLOW.   Report Status 08/31/2012 FINAL   Final  CULTURE, RESPIRATORY (NON-EXPECTORATED)     Status: None   Collection Time    08/31/12  1:10 PM      Result Value Range Status   Specimen Description SPUTUM   Final   Special Requests NONE   Final   Gram Stain     Final   Value: RARE WBC PRESENT, PREDOMINANTLY PMN     RARE SQUAMOUS EPITHELIAL CELLS PRESENT     NO ORGANISMS SEEN   Culture NORMAL OROPHARYNGEAL FLORA   Final   Report Status 09/02/2012 FINAL   Final  AFB CULTURE WITH SMEAR     Status: None   Collection Time    09/01/12 11:19 AM      Result Value Range Status   Specimen Description BRONCHIAL WASHINGS   Final   Special Requests NONE   Final   ACID FAST SMEAR NO ACID FAST BACILLI SEEN   Final   Culture     Final   Value: CULTURE WILL BE EXAMINED FOR 6 WEEKS BEFORE ISSUING A FINAL REPORT   Report Status PENDING   Incomplete  FUNGUS CULTURE W SMEAR     Status: None   Collection Time    09/01/12 11:19 AM      Result Value Range Status   Specimen Description BRONCHIAL WASHINGS   Final   Special Requests NONE   Final   Fungal Smear FEW HYPHAL ELEMENTS   Final   Culture CANDIDA ALBICANS   Final   Report Status PENDING   Incomplete  LEGIONELLA CULTURE     Status: None   Collection Time    09/01/12 11:19 AM      Result Value Range Status   Specimen Description BRONCHIAL WASHINGS   Final   Special Requests NONE   Final   Culture     Final   Value: NO LEGIONELLA ISOLATED, CULTURE IN PROGRESS FOR 5 DAYS   Report Status PENDING   Incomplete  CULTURE, RESPIRATORY (NON-EXPECTORATED)     Status: None   Collection Time    09/01/12 11:19 AM      Result Value Range Status   Specimen Description BRONCHIAL WASHINGS   Final   Special Requests NONE   Final   Gram Stain     Final   Value: RARE WBC PRESENT,  PREDOMINANTLY PMN     RARE SQUAMOUS EPITHELIAL CELLS PRESENT     RARE YEAST     RARE GRAM NEGATIVE RODS  Culture     Final   Value: FEW ESCHERICHIA COLI     Note: Confirmed Extended Spectrum Beta-Lactamase Producer (ESBL) CRITICAL RESULT CALLED TO, READ BACK BY AND VERIFIED WITH: JENNIFER Z@7 :45AM ON 09/04/12 BY DANTS     FEW CANDIDA ALBICANS   Report Status 09/04/2012 FINAL   Final   Organism ID, Bacteria ESCHERICHIA COLI   Final    RADIOLOGY STUDIES/RESULTS: Dg Chest 2 View  09/01/2012   *RADIOLOGY REPORT*  Clinical Data: Shortness of breath.  CHEST - 2 VIEW  Comparison: 08/31/2012.  Findings: The left subclavian Port-A-Cath tip it is most likely in the mid SVC at the level of the carina.  Stable pacer wires.  The heart is enlarged but unchanged.  Persistent bilateral infiltrates and effusions.  IMPRESSION:  1.  The Port-A-Cath tip appears to be in the mid SVC. 2.  Persistent bilateral infiltrates and small effusions.   Original Report Authenticated By: Rudie Meyer, [name]   Ct Chest Wo Contrast  08/30/2012   *RADIOLOGY REPORT*  Clinical Data: Hemoptysis.  Pneumonia versus edema.  CT CHEST WITHOUT CONTRAST  Technique:  Multidetector CT imaging of the chest was performed following the standard protocol without IV contrast.  Comparison: Chest radiograph 08/30/2012 and chest CT 02/05/2012  Findings: Heart is enlarged.  Cardiomegaly appears stable. Stable prominent fat density, consistent with lipoma, between the left and right atria.  Dual lead pacer is present.  There is heavy coronary artery atherosclerotic calcification of all three coronary arteries.  Aortic valvular calcifications are present.  A left IJ central venous catheter terminates in the superior vena cava. Thyroid gland and thoracic inlet appear within normal limits.  Heavy atherosclerotic calcification of the thoracic aorta, without aneurysm.  Several mediastinal lymph nodes are visualized, and within normal limits for size.  No  pathologically enlarged lymph nodes are seen in the thorax.  Evaluation for the hilar lymph nodes is somewhat limited without intravenous contrast.  Small bilateral pleural effusions are present.  Multilevel degenerative changes of the thoracic spine.  Multiple benign hemangiomas in the thoracic spine vertebral bodies.  No acute or suspicious osseous abnormality.  No acute findings are identified in the abdomen.  Surgical clips are seen in the left upper quadrant.  There are postsurgical changes of the right lower lobectomy.  There is a background of centrilobular emphysema.  Stable scarring in the right upper lobe anteriorly.  No definite intralobular septal thickening.  There is markedly asymmetric airspace disease in the inferior and posterior aspect of the right upper lobe, where there are areas of consolidation.  There are ground-glass opacities which are new compared to prior chest CT in the left upper lobe, left lower lobe, and right middle lobe.  5 mm pulmonary nodule in the left upper lobe peripherally on image #27 of the lung windows is new.  IMPRESSION:  1.  Markedly asymmetric bilateral airspace disease.  Airspace disease is most prominent, with areas of consolidation, in the posterior and inferior right upper lobe ( the patient has a history of right lower lobectomy).  Findings in the right upper lobe are favored to be due to pneumonia or aspiration. 2.  New ground-glass opacities in the left upper lobe and left lower lobe may be due to early infection.  Underlying mild pulmonary edema cannot be excluded in this patient with cardiomegaly and small bilateral pleural effusions. 3.  New 5 mm pulmonary nodule left upper lobe. If the patient is at high risk for bronchogenic carcinoma, follow-up chest  CT at 6-12 months is recommended.  If the patient is at low risk for bronchogenic carcinoma, follow-up chest CT at 12 months is recommended.  This recommendation follows the consensus statement: Guidelines for  Management of Small Pulmonary Nodules Detected on CT Scans: A Statement from the Fleischner Society as published in Radiology 2005; 237:395-400. 4.  Cardiomegaly with three-vessel coronary artery disease and dual lead cardiac pacer. 5.  Small bilateral pleural effusions 6.  Aortic valve calcifications. This finding raises the possibility of aortic valvular stenosis.  .   Original Report Authenticated By: Britta Mccreedy, [name]   Dg Chest Port 1 View  08/31/2012   *RADIOLOGY REPORT*  Clinical Data: Follow-up edema  PORTABLE CHEST - 1 VIEW  Comparison: Yesterday  Findings: The tip of the left subclavian Port-A-Cath has changed its orientation.  Based on a single view, it is likely in the azygos vein.  Right subclavian dual lead pacemaker device and leads are stable and intact.  Bilateral pleural effusions left greater than right are stable.  Diffuse edema is stable. Cardiomegaly.  IMPRESSION: Left subclavian Port-A-Cath tip has changed its position and is likely in the azygos vein.  This can be confirmed with a lateral view.  Stable airspace disease and pleural effusions.   Original Report Authenticated By: Jolaine Click, [name]   Dg Chest Port 1 View  08/30/2012   *RADIOLOGY REPORT*  Clinical Data: Respiratory failure.  PORTABLE CHEST - 1 VIEW  Comparison: 08/29/2012 and 08/27/2012  Findings: Prominent interstitial markings could represent underlying edema.  There are persistent interstitial and airspace densities in the right mid and lower lung region.  Heart size remains enlarged.  Again noted is a right dual lead cardiac pacemaker.  Left subclavian Port-A-Cath in the SVC region.  Aortic arch is heavily calcified.  IMPRESSION:  Prominent interstitial markings may represent underlying edema. Again noted are increased densities in the right mid and lower lung region which could represent asymmetric edema but cannot exclude an infectious etiology.   Original Report Authenticated By: Richarda Overlie, [name]   Dg Chest Port 1  View  08/29/2012   *RADIOLOGY REPORT*  Clinical Data: Follow-up CHF  PORTABLE CHEST - 1 VIEW  Comparison: Prior chest x-ray 08/27/2012  Findings: Stable position of left subclavian central venous catheter with the tip in the mid superior vena cava.  Right subclavian approach cardiac rhythm maintenance device with leads in the right atrium and right ventricle.  Slightly improved inspiratory volumes with clearing in the right base.  Stable elevation of the left hemidiaphragm resulting in blunting of the left costophrenic angle.  Decreasing interstitial edema with the residual asymmetric patchy opacity in the right mid and lower lobe. Small residual right pleural effusion.  Stable cardiomegaly. Aortic atherosclerosis again noted.  No pneumothorax.  IMPRESSION:  1.  Decreasing pulmonary edema with improved inspiratory volumes and clearing in the right base. 2.  Residual asymmetric patchy opacity in the right mid and lower lung may reflect residual asymmetric alveolar edema, or pneumonia. 3.  Persistent small right pleural effusion, chronic elevation of the left hemidiaphragm and cardiomegaly.   Original Report Authenticated By: Malachy Moan, [name]   Dg Chest Portable 1 View  08/27/2012   *RADIOLOGY REPORT*  Clinical Data: Shortness of breath.  History of lung cancer post lobectomy.  PORTABLE CHEST - 1 VIEW  Comparison: 02/02/2012  Findings: Shallow inspiration.  Cardiac enlargement with pulmonary vascular congestion and perihilar edema.  Bilateral pleural effusions.  Atelectasis in the lung bases.  No pneumothorax.  Changes have developed since the previous study.  Postoperative changes in the right chest.  Stable appearance of cardiac pacemaker and left central venous catheter.  Calcified and tortuous aorta.  IMPRESSION: Interval development of cardiac enlargement, pulmonary vascular congestion, perihilar edema, and bilateral pleural effusions.   Original Report Authenticated By: Burman Nieves, [name]     Jeoffrey Massed, MD  [facility] Pager:[phone]  If 7PM-7AM, please contact night-coverage [email or web address] Password [email or web address] 09/05/2012, 2:01 PM   LOS: 9 days

## 2012-09-05 NOTE — Progress Notes (Signed)
SUBJECTIVE:  Breathing better today  OBJECTIVE:   Vitals:   Filed Vitals:   09/04/12 1344 09/04/12 2100 09/05/12 0432 09/05/12 0500  BP: 154/70 165/79  158/73  Pulse: 81 80  77  Temp: 98.1 F (36.7 C) 97.9 F (36.6 C)  97.8 F (36.6 C)  TempSrc: Oral Oral  Oral  Resp: 18 20  20   Height:      Weight:   94.4 kg (208 lb 1.8 oz)   SpO2: 92% 95%  95%   I&O's:   Intake/Output Summary (Last 24 hours) at 09/05/12 0820 Last data filed at 09/05/12 0600  Gross per 24 hour  Intake    340 ml  Output   1200 ml  Net   -860 ml   TELEMETRY: Reviewed telemetry pt in atrial fibrillation     PHYSICAL EXAM General: Well developed, well nourished, in no acute distress Head: Eyes PERRLA, No xanthomas.   Normal cephalic and atramatic  Lungs:   Clear bilaterally to auscultation and percussion. Heart:   Irregularly irregular S1 S2 Pulses are 2+ & equal. Abdomen: Bowel sounds are positive, abdomen soft and non-tender without masses  Extremities:   No clubbing, cyanosis or edema.  DP +1 Neuro: Alert and oriented X 3. Psych:  Good affect, responds appropriately   LABS: Basic Metabolic Panel:  Recent Labs  16/10/96 0515 09/05/12 0500  NA 137 139  K 3.2* 3.5  CL 100 99  CO2 25 28  GLUCOSE 237* 146*  BUN 72* 67*  CREATININE 2.09* 2.14*  CALCIUM 7.7* 8.2*   Liver Function Tests: No results found for this basename: AST, ALT, ALKPHOS, BILITOT, PROT, ALBUMIN,  in the last 72 hours No results found for this basename: LIPASE, AMYLASE,  in the last 72 hours CBC:  Recent Labs  09/02/12 1245  09/04/12 0515 09/05/12 0500  WBC 18.5*  < > 19.0* 19.0*  NEUTROABS 16.5*  --   --  16.4*  HGB 12.1*  < > 10.9* 12.4*  HCT 35.1*  < > 32.6* 36.1*  MCV 91.2  < > 91.1 91.9  PLT 369  < > 306 386  < > = values in this interval not displayed. Coag Panel:   Lab Results  Component Value Date   INR 1.04 08/27/2012   INR 1.09 02/09/2009   INR 1.11 02/08/2009    RADIOLOGY: Dg Chest 2  View  09/01/2012   *RADIOLOGY REPORT*  Clinical Data: Shortness of breath.  CHEST - 2 VIEW  Comparison: 08/31/2012.  Findings: The left subclavian Port-A-Cath tip it is most likely in the mid SVC at the level of the carina.  Stable pacer wires.  The heart is enlarged but unchanged.  Persistent bilateral infiltrates and effusions.  IMPRESSION:  1.  The Port-A-Cath tip appears to be in the mid SVC. 2.  Persistent bilateral infiltrates and small effusions.   Original Report Authenticated By: Rudie Meyer, [name]   Ct Chest Wo Contrast  08/30/2012   *RADIOLOGY REPORT*  Clinical Data: Hemoptysis.  Pneumonia versus edema.  CT CHEST WITHOUT CONTRAST  Technique:  Multidetector CT imaging of the chest was performed following the standard protocol without IV contrast.  Comparison: Chest radiograph 08/30/2012 and chest CT 02/05/2012  Findings: Heart is enlarged.  Cardiomegaly appears stable. Stable prominent fat density, consistent with lipoma, between the left and right atria.  Dual lead pacer is present.  There is heavy coronary artery atherosclerotic calcification of all three coronary arteries.  Aortic valvular calcifications are present.  A left IJ central venous catheter terminates in the superior vena cava. Thyroid gland and thoracic inlet appear within normal limits.  Heavy atherosclerotic calcification of the thoracic aorta, without aneurysm.  Several mediastinal lymph nodes are visualized, and within normal limits for size.  No pathologically enlarged lymph nodes are seen in the thorax.  Evaluation for the hilar lymph nodes is somewhat limited without intravenous contrast.  Small bilateral pleural effusions are present.  Multilevel degenerative changes of the thoracic spine.  Multiple benign hemangiomas in the thoracic spine vertebral bodies.  No acute or suspicious osseous abnormality.  No acute findings are identified in the abdomen.  Surgical clips are seen in the left upper quadrant.  There are postsurgical  changes of the right lower lobectomy.  There is a background of centrilobular emphysema.  Stable scarring in the right upper lobe anteriorly.  No definite intralobular septal thickening.  There is markedly asymmetric airspace disease in the inferior and posterior aspect of the right upper lobe, where there are areas of consolidation.  There are ground-glass opacities which are new compared to prior chest CT in the left upper lobe, left lower lobe, and right middle lobe.  5 mm pulmonary nodule in the left upper lobe peripherally on image #27 of the lung windows is new.  IMPRESSION:  1.  Markedly asymmetric bilateral airspace disease.  Airspace disease is most prominent, with areas of consolidation, in the posterior and inferior right upper lobe ( the patient has a history of right lower lobectomy).  Findings in the right upper lobe are favored to be due to pneumonia or aspiration. 2.  New ground-glass opacities in the left upper lobe and left lower lobe may be due to early infection.  Underlying mild pulmonary edema cannot be excluded in this patient with cardiomegaly and small bilateral pleural effusions. 3.  New 5 mm pulmonary nodule left upper lobe. If the patient is at high risk for bronchogenic carcinoma, follow-up chest CT at 6-12 months is recommended.  If the patient is at low risk for bronchogenic carcinoma, follow-up chest CT at 12 months is recommended.  This recommendation follows the consensus statement: Guidelines for Management of Small Pulmonary Nodules Detected on CT Scans: A Statement from the Fleischner Society as published in Radiology 2005; 237:395-400. 4.  Cardiomegaly with three-vessel coronary artery disease and dual lead cardiac pacer. 5.  Small bilateral pleural effusions 6.  Aortic valve calcifications. This finding raises the possibility of aortic valvular stenosis.  .   Original Report Authenticated By: Britta Mccreedy, [name]   Dg Chest Port 1 View  08/31/2012   *RADIOLOGY REPORT*   Clinical Data: Follow-up edema  PORTABLE CHEST - 1 VIEW  Comparison: Yesterday  Findings: The tip of the left subclavian Port-A-Cath has changed its orientation.  Based on a single view, it is likely in the azygos vein.  Right subclavian dual lead pacemaker device and leads are stable and intact.  Bilateral pleural effusions left greater than right are stable.  Diffuse edema is stable. Cardiomegaly.  IMPRESSION: Left subclavian Port-A-Cath tip has changed its position and is likely in the azygos vein.  This can be confirmed with a lateral view.  Stable airspace disease and pleural effusions.   Original Report Authenticated By: Jolaine Click, [name]   Dg Chest Port 1 View  08/30/2012   *RADIOLOGY REPORT*  Clinical Data: Respiratory failure.  PORTABLE CHEST - 1 VIEW  Comparison: 08/29/2012 and 08/27/2012  Findings: Prominent interstitial markings could represent underlying edema.  There  are persistent interstitial and airspace densities in the right mid and lower lung region.  Heart size remains enlarged.  Again noted is a right dual lead cardiac pacemaker.  Left subclavian Port-A-Cath in the SVC region.  Aortic arch is heavily calcified.  IMPRESSION:  Prominent interstitial markings may represent underlying edema. Again noted are increased densities in the right mid and lower lung region which could represent asymmetric edema but cannot exclude an infectious etiology.   Original Report Authenticated By: Richarda Overlie, [name]   Dg Chest Port 1 View  08/29/2012   *RADIOLOGY REPORT*  Clinical Data: Follow-up CHF  PORTABLE CHEST - 1 VIEW  Comparison: Prior chest x-ray 08/27/2012  Findings: Stable position of left subclavian central venous catheter with the tip in the mid superior vena cava.  Right subclavian approach cardiac rhythm maintenance device with leads in the right atrium and right ventricle.  Slightly improved inspiratory volumes with clearing in the right base.  Stable elevation of the left hemidiaphragm resulting  in blunting of the left costophrenic angle.  Decreasing interstitial edema with the residual asymmetric patchy opacity in the right mid and lower lobe. Small residual right pleural effusion.  Stable cardiomegaly. Aortic atherosclerosis again noted.  No pneumothorax.  IMPRESSION:  1.  Decreasing pulmonary edema with improved inspiratory volumes and clearing in the right base. 2.  Residual asymmetric patchy opacity in the right mid and lower lung may reflect residual asymmetric alveolar edema, or pneumonia. 3.  Persistent small right pleural effusion, chronic elevation of the left hemidiaphragm and cardiomegaly.   Original Report Authenticated By: Malachy Moan, [name]   Dg Chest Portable 1 View  08/27/2012   *RADIOLOGY REPORT*  Clinical Data: Shortness of breath.  History of lung cancer post lobectomy.  PORTABLE CHEST - 1 VIEW  Comparison: 02/02/2012  Findings: Shallow inspiration.  Cardiac enlargement with pulmonary vascular congestion and perihilar edema.  Bilateral pleural effusions.  Atelectasis in the lung bases.  No pneumothorax. Changes have developed since the previous study.  Postoperative changes in the right chest.  Stable appearance of cardiac pacemaker and left central venous catheter.  Calcified and tortuous aorta.  IMPRESSION: Interval development of cardiac enlargement, pulmonary vascular congestion, perihilar edema, and bilateral pleural effusions.   Original Report Authenticated By: Burman Nieves, [name]     Principal Problem:  Acute-on-chronic respiratory failure  Active Problems:  CARCINOMA, LUNG, SQUAMOUS CELL  CHRONIC OBSTRUCTIVE PULMONARY DISEASE, SEVERE  HTN (hypertension)  DM (diabetes mellitus) type II controlled with renal manifestation  Atrial fibrillation  - rate controlled CKD (chronic kidney disease), stage III  Cellulitis of left ankle  Acute on chronic diastolic congestive heart failure  Leukocytosis  Elevation of cardiac enzymes  Hemoptysis  Pneumonia due to  Gram-negative bacteria  Hypokalemia--repleted  - contnue ongoing IV diuresis until CR bumps  - restart apixiban on 6/25 per pulmonary and TEE DCCV the week to follow  - continue  Jessica Priest, MD  09/05/2012  8:20 AM

## 2012-09-06 LAB — LEGIONELLA CULTURE

## 2012-09-06 LAB — BASIC METABOLIC PANEL
CO2: 30 mEq/L (ref 19–32)
Calcium: 8.5 mg/dL (ref 8.4–10.5)
Creatinine, Ser: 2.15 mg/dL — ABNORMAL HIGH (ref 0.50–1.35)
GFR calc Af Amer: 32 mL/min — ABNORMAL LOW (ref >90)
GFR calc non Af Amer: 28 mL/min — ABNORMAL LOW (ref >90)
Sodium: 137 mEq/L (ref 135–145)

## 2012-09-06 LAB — PRO B NATRIURETIC PEPTIDE: Pro B Natriuretic peptide (BNP): 1793 pg/mL — ABNORMAL HIGH (ref 0–450)

## 2012-09-06 LAB — GLUCOSE, CAPILLARY
Glucose-Capillary: 127 mg/dL — ABNORMAL HIGH (ref 70–99)
Glucose-Capillary: 365 mg/dL — ABNORMAL HIGH (ref 70–99)

## 2012-09-06 NOTE — Progress Notes (Signed)
PATIENT DETAILS Name: [name] RINER Sr. Age: 79 y.o. Sex: male Date of Birth: Mar 29, 1932 Admit Date: 08/27/2012 Admitting Physician Clydia Llano, MD JXB:[id number] [name], MD  Subjective: No hemoptysis yesterday-but had one episode again this am. Wife worried about mild reddish discoloration of b/l foot-will monitor for now.  Assessment/Plan: Acute-on-chronic respiratory failure with hypoxia due to:  Acute on chronic diastolic heart failure CHF  Acute on CHRONIC OBSTRUCTIVE PULMONARY DISEASE, SEVERE - Improved  - Good response to Lasix infusion -cont supportive care with nebs/MDI and prednisone decreased to 20 mg with plans for a slow taper  PNA -BAL +ve for ESBL E Coli -completed 9 days of levaquin-stop Levaquin, now on Primaxin-Day 3  Acute diastolic heart failure - Cardiology following - Continue with Lasix infusion - Slowly improving and getting more compensated. More than 14 L negative balance  Atrial fibrillation - Unfortunately off anti-coagulation given hemoptysis, per Dr. Delford Field, okay to resume anticoagulation in one week time, but may need to touch base with PCCM-given mild recurrent hemoptysis - On metoprolol and Cardizem for rate control -Tolerating Amiodarone well  Hemoptysis -? Etiology- provoked by xarelto-? Recurrence of underlying malignancy - Overall much better-but does recur intermittently -  Status post a bronchoscopy with no endobronchial lesions seen, only old clot seen on bronchoscopy - BAL +ve for ESBL E Coli-now on Primaxin  CKD (chronic kidney disease), stage III  - Creatinine improving. Monitor renal function closely.  Leukocytosis - Multifactorial from possible pneumonia, on steroids as well - Monitor - Hypertension - Controlled- continue with current medication  Diabetes - Increase Lantus to 10 units, continue with SSI  CORONARY ARTERY DISEASE (nonobstructive disease 2006 catheterization)/Elevation of cardiac enzymes  -no CP and likely due  to acute HF, demand ischemia from recent hypoxia and worsened renal function - Cardiology following  Cellulitis of left ankle  -has decreased rednessboth legs c/w stasis dermatitis- resolved   NSCLC s/p RULectomy and adjuvant chemo in 2006 - Does have a new lung nodule-will need close followup as an outpatient  Disposition: Remain inpatient  DVT Prophylaxis:  SCD's  Code Status: Full code  Family Communication Spouse at bedside  Procedures:  None  CONSULTS:  cardiology and pulmonary/intensive care   MEDICATIONS: Scheduled Meds: . amiodarone  400 mg Oral BID  . arformoterol  15 mcg Nebulization BID  . atorvastatin  40 mg Oral QHS  . budesonide (PULMICORT) nebulizer solution  0.5 mg Nebulization BID  . citalopram  10 mg Oral Daily  . diltiazem  180 mg Oral BID  . diphenhydrAMINE  25 mg Oral Q6H  . ezetimibe  10 mg Oral Daily  . furosemide  120 mg Intravenous BID  . guaiFENesin  1,200 mg Oral BID  . hydrALAZINE  50 mg Oral BID  . imipenem-cilastatin  250 mg Intravenous Q6H  . insulin aspart  0-9 Units Subcutaneous TID WC  . insulin glargine  10 Units Subcutaneous QHS  . magnesium oxide  400 mg Oral Daily  . metoprolol succinate  25 mg Oral BID WC  . multivitamin  1 tablet Oral Daily  . pantoprazole  40 mg Oral Daily  . predniSONE  20 mg Oral Daily  . sodium chloride  3 mL Intravenous Q12H  . tiotropium  18 mcg Inhalation Daily   Continuous Infusions:  PRN Meds:.ALPRAZolam, levalbuterol, morphine injection, nitroGLYCERIN, ondansetron (ZOFRAN) IV, ondansetron, oxyCODONE, polyvinyl alcohol, sodium chloride, traZODone  Antibiotics: Anti-infectives   Start     Dose/Rate Route Frequency Ordered Stop   09/04/12  1200  imipenem-cilastatin (PRIMAXIN) 250 mg in sodium chloride 0.9 % 100 mL IVPB     250 mg 200 mL/hr over 30 Minutes Intravenous 4 times per day 09/04/12 0856     08/29/12 1200  levofloxacin (LEVAQUIN) tablet 500 mg  Status:  Discontinued     500 mg Oral  Every 48 hours 08/29/12 0957 09/04/12 1008   08/27/12 0900  vancomycin (VANCOCIN) 1,250 mg in sodium chloride 0.9 % 250 mL IVPB  Status:  Discontinued     1,250 mg 166.7 mL/hr over 90 Minutes Intravenous Every 24 hours 08/27/12 0826 08/28/12 1809   08/27/12 0900  piperacillin-tazobactam (ZOSYN) IVPB 3.375 g  Status:  Discontinued     3.375 g 12.5 mL/hr over 240 Minutes Intravenous 3 times per day 08/27/12 0826 08/28/12 1809   08/27/12 0600  levofloxacin (LEVAQUIN) IVPB 500 mg     500 mg 100 mL/hr over 60 Minutes Intravenous  Once 08/27/12 0554 08/27/12 0735       PHYSICAL EXAM: Vital signs in last 24 hours: Filed Vitals:   09/05/12 2043 09/06/12 0511 09/06/12 0817 09/06/12 1043  BP:  147/93 166/99 130/85  Pulse: 79 87 78 87  Temp:  97.8 F (36.6 C)    TempSrc:  Axillary    Resp: 20 19    Height:      Weight:  98.5 kg (217 lb 2.5 oz)    SpO2: 96% 92%      Weight change: 4.1 kg (9 lb 0.6 oz) Filed Weights   09/03/12 0626 09/05/12 0432 09/06/12 0511  Weight: 93 kg (205 lb 0.4 oz) 94.4 kg (208 lb 1.8 oz) 98.5 kg (217 lb 2.5 oz)   Body mass index is 34 kg/(m^2).   Gen Exam: Awake and alert with clear speech.   Neck: Supple, No JVD.   Chest: B/L Clear.   CVS: S1 S2 Regular, no murmurs.  Abdomen: soft, BS +, non tender, non distended.  Extremities: 1+ edema, lower extremities warm to touch. Neurologic: Non Focal.   Skin: No Rash.   Wounds: N/A.   Intake/Output from previous day:  Intake/Output Summary (Last 24 hours) at 09/06/12 1051 Last data filed at 09/06/12 0600  Gross per 24 hour  Intake   1128 ml  Output   2975 ml  Net  -1847 ml     LAB RESULTS: CBC  Recent Labs Lab 08/31/12 0500 09/01/12 0454 09/02/12 1245 09/03/12 0527 09/04/12 0515 09/05/12 0500  WBC 10.5 14.7* 18.5* 19.3* 19.0* 19.0*  HGB 11.5* 11.3* 12.1* 11.8* 10.9* 12.4*  HCT 34.5* 33.9* 35.1* 35.2* 32.6* 36.1*  PLT 347 362 369 387 306 386  MCV 92.2 91.6 91.2 91.7 91.1 91.9  MCH 30.7 30.5  31.4 30.7 30.4 31.6  MCHC 33.3 33.3 34.5 33.5 33.4 34.3  RDW 15.8* 15.8* 15.5 15.4 15.3 15.2  LYMPHSABS 0.8  --  0.8  --   --  1.4  MONOABS 0.2  --  1.2*  --   --  1.3*  EOSABS 0.0  --  0.0  --   --  0.0  BASOSABS 0.0  --  0.0  --   --  0.0    Chemistries   Recent Labs Lab 09/02/12 1245 09/03/12 0527 09/04/12 0515 09/05/12 0500 09/06/12 0545  NA 134* 132* 137 139 137  K 3.7 3.2* 3.2* 3.5 4.0  CL 92* 92* 100 99 99  CO2 29 28 25 28 30   GLUCOSE 249* 187* 237* 146* 131*  BUN 80* 81*  72* 67* 64*  CREATININE 2.54* 2.39* 2.09* 2.14* 2.15*  CALCIUM 8.5 8.3* 7.7* 8.2* 8.5    CBG:  Recent Labs Lab 09/05/12 0751 09/05/12 1202 09/05/12 1617 09/05/12 2133 09/06/12 0737  GLUCAP 122* 107* 301* 226* 106*    GFR Estimated Creatinine Clearance: 32.2 ml/min (by C-G formula based on Cr of 2.15).  Coagulation profile No results found for this basename: INR, PROTIME,  in the last 168 hours  Cardiac Enzymes No results found for this basename: CK, CKMB, TROPONINI, MYOGLOBIN,  in the last 168 hours  No components found with this basename: POCBNP,  No results found for this basename: DDIMER,  in the last 72 hours No results found for this basename: HGBA1C,  in the last 72 hours No results found for this basename: CHOL, HDL, LDLCALC, TRIG, CHOLHDL, LDLDIRECT,  in the last 72 hours No results found for this basename: TSH, T4TOTAL, FREET3, T3FREE, THYROIDAB,  in the last 72 hours No results found for this basename: VITAMINB12, FOLATE, FERRITIN, TIBC, IRON, RETICCTPCT,  in the last 72 hours No results found for this basename: LIPASE, AMYLASE,  in the last 72 hours  Urine Studies No results found for this basename: UACOL, UAPR, USPG, UPH, UTP, UGL, UKET, UBIL, UHGB, UNIT, UROB, ULEU, UEPI, UWBC, URBC, UBAC, CAST, CRYS, UCOM, BILUA,  in the last 72 hours  MICROBIOLOGY: Recent Results (from the past 240 hour(s))  URINE CULTURE     Status: None   Collection Time    08/27/12  8:28 PM       Result Value Range Status   Specimen Description URINE, CLEAN CATCH   Final   Special Requests NONE   Final   Culture  Setup Time 08/28/2012 09:51   Final   Colony Count NO GROWTH   Final   Culture NO GROWTH   Final   Report Status 08/29/2012 FINAL   Final  CULTURE, EXPECTORATED SPUTUM-ASSESSMENT     Status: None   Collection Time    08/31/12  8:38 AM      Result Value Range Status   Specimen Description SPUTUM   Final   Special Requests NONE   Final   Sputum evaluation     Final   Value: MICROSCOPIC FINDINGS SUGGEST THAT THIS SPECIMEN IS NOT REPRESENTATIVE OF LOWER RESPIRATORY SECRETIONS. PLEASE RECOLLECT.     CALLED TO Mackie Pai 08/31/12 0913 BY K SCHULTZ   Report Status 08/31/2012 FINAL   Final  CULTURE, EXPECTORATED SPUTUM-ASSESSMENT     Status: None   Collection Time    08/31/12  1:10 PM      Result Value Range Status   Specimen Description SPUTUM   Final   Special Requests NONE   Final   Sputum evaluation     Final   Value: THIS SPECIMEN IS ACCEPTABLE. RESPIRATORY CULTURE REPORT TO FOLLOW.   Report Status 08/31/2012 FINAL   Final  CULTURE, RESPIRATORY (NON-EXPECTORATED)     Status: None   Collection Time    08/31/12  1:10 PM      Result Value Range Status   Specimen Description SPUTUM   Final   Special Requests NONE   Final   Gram Stain     Final   Value: RARE WBC PRESENT, PREDOMINANTLY PMN     RARE SQUAMOUS EPITHELIAL CELLS PRESENT     NO ORGANISMS SEEN   Culture NORMAL OROPHARYNGEAL FLORA   Final   Report Status 09/02/2012 FINAL   Final  AFB CULTURE WITH SMEAR  Status: None   Collection Time    09/01/12 11:19 AM      Result Value Range Status   Specimen Description BRONCHIAL WASHINGS   Final   Special Requests NONE   Final   ACID FAST SMEAR NO ACID FAST BACILLI SEEN   Final   Culture     Final   Value: CULTURE WILL BE EXAMINED FOR 6 WEEKS BEFORE ISSUING A FINAL REPORT   Report Status PENDING   Incomplete  FUNGUS CULTURE W SMEAR     Status: None    Collection Time    09/01/12 11:19 AM      Result Value Range Status   Specimen Description BRONCHIAL WASHINGS   Final   Special Requests NONE   Final   Fungal Smear FEW HYPHAL ELEMENTS   Final   Culture CANDIDA ALBICANS   Final   Report Status PENDING   Incomplete  LEGIONELLA CULTURE     Status: None   Collection Time    09/01/12 11:19 AM      Result Value Range Status   Specimen Description BRONCHIAL WASHINGS   Final   Special Requests NONE   Final   Culture     Final   Value: NO LEGIONELLA ISOLATED, CULTURE IN PROGRESS FOR 5 DAYS   Report Status PENDING   Incomplete  CULTURE, RESPIRATORY (NON-EXPECTORATED)     Status: None   Collection Time    09/01/12 11:19 AM      Result Value Range Status   Specimen Description BRONCHIAL WASHINGS   Final   Special Requests NONE   Final   Gram Stain     Final   Value: RARE WBC PRESENT, PREDOMINANTLY PMN     RARE SQUAMOUS EPITHELIAL CELLS PRESENT     RARE YEAST     RARE GRAM NEGATIVE RODS   Culture     Final   Value: FEW ESCHERICHIA COLI     Note: Confirmed Extended Spectrum Beta-Lactamase Producer (ESBL) CRITICAL RESULT CALLED TO, READ BACK BY AND VERIFIED WITH: JENNIFER Z@7 :45AM ON 09/04/12 BY DANTS     FEW CANDIDA ALBICANS   Report Status 09/04/2012 FINAL   Final   Organism ID, Bacteria ESCHERICHIA COLI   Final    RADIOLOGY STUDIES/RESULTS: Dg Chest 2 View  09/01/2012   *RADIOLOGY REPORT*  Clinical Data: Shortness of breath.  CHEST - 2 VIEW  Comparison: 08/31/2012.  Findings: The left subclavian Port-A-Cath tip it is most likely in the mid SVC at the level of the carina.  Stable pacer wires.  The heart is enlarged but unchanged.  Persistent bilateral infiltrates and effusions.  IMPRESSION:  1.  The Port-A-Cath tip appears to be in the mid SVC. 2.  Persistent bilateral infiltrates and small effusions.   Original Report Authenticated By: Rudie Meyer, [name]   Ct Chest Wo Contrast  08/30/2012   *RADIOLOGY REPORT*  Clinical Data: Hemoptysis.   Pneumonia versus edema.  CT CHEST WITHOUT CONTRAST  Technique:  Multidetector CT imaging of the chest was performed following the standard protocol without IV contrast.  Comparison: Chest radiograph 08/30/2012 and chest CT 02/05/2012  Findings: Heart is enlarged.  Cardiomegaly appears stable. Stable prominent fat density, consistent with lipoma, between the left and right atria.  Dual lead pacer is present.  There is heavy coronary artery atherosclerotic calcification of all three coronary arteries.  Aortic valvular calcifications are present.  A left IJ central venous catheter terminates in the superior vena cava. Thyroid gland and thoracic inlet appear  within normal limits.  Heavy atherosclerotic calcification of the thoracic aorta, without aneurysm.  Several mediastinal lymph nodes are visualized, and within normal limits for size.  No pathologically enlarged lymph nodes are seen in the thorax.  Evaluation for the hilar lymph nodes is somewhat limited without intravenous contrast.  Small bilateral pleural effusions are present.  Multilevel degenerative changes of the thoracic spine.  Multiple benign hemangiomas in the thoracic spine vertebral bodies.  No acute or suspicious osseous abnormality.  No acute findings are identified in the abdomen.  Surgical clips are seen in the left upper quadrant.  There are postsurgical changes of the right lower lobectomy.  There is a background of centrilobular emphysema.  Stable scarring in the right upper lobe anteriorly.  No definite intralobular septal thickening.  There is markedly asymmetric airspace disease in the inferior and posterior aspect of the right upper lobe, where there are areas of consolidation.  There are ground-glass opacities which are new compared to prior chest CT in the left upper lobe, left lower lobe, and right middle lobe.  5 mm pulmonary nodule in the left upper lobe peripherally on image #27 of the lung windows is new.  IMPRESSION:  1.  Markedly  asymmetric bilateral airspace disease.  Airspace disease is most prominent, with areas of consolidation, in the posterior and inferior right upper lobe ( the patient has a history of right lower lobectomy).  Findings in the right upper lobe are favored to be due to pneumonia or aspiration. 2.  New ground-glass opacities in the left upper lobe and left lower lobe may be due to early infection.  Underlying mild pulmonary edema cannot be excluded in this patient with cardiomegaly and small bilateral pleural effusions. 3.  New 5 mm pulmonary nodule left upper lobe. If the patient is at high risk for bronchogenic carcinoma, follow-up chest CT at 6-12 months is recommended.  If the patient is at low risk for bronchogenic carcinoma, follow-up chest CT at 12 months is recommended.  This recommendation follows the consensus statement: Guidelines for Management of Small Pulmonary Nodules Detected on CT Scans: A Statement from the Fleischner Society as published in Radiology 2005; 237:395-400. 4.  Cardiomegaly with three-vessel coronary artery disease and dual lead cardiac pacer. 5.  Small bilateral pleural effusions 6.  Aortic valve calcifications. This finding raises the possibility of aortic valvular stenosis.  .   Original Report Authenticated By: Britta Mccreedy, [name]   Dg Chest Port 1 View  08/31/2012   *RADIOLOGY REPORT*  Clinical Data: Follow-up edema  PORTABLE CHEST - 1 VIEW  Comparison: Yesterday  Findings: The tip of the left subclavian Port-A-Cath has changed its orientation.  Based on a single view, it is likely in the azygos vein.  Right subclavian dual lead pacemaker device and leads are stable and intact.  Bilateral pleural effusions left greater than right are stable.  Diffuse edema is stable. Cardiomegaly.  IMPRESSION: Left subclavian Port-A-Cath tip has changed its position and is likely in the azygos vein.  This can be confirmed with a lateral view.  Stable airspace disease and pleural effusions.   Original  Report Authenticated By: Jolaine Click, [name]   Dg Chest Port 1 View  08/30/2012   *RADIOLOGY REPORT*  Clinical Data: Respiratory failure.  PORTABLE CHEST - 1 VIEW  Comparison: 08/29/2012 and 08/27/2012  Findings: Prominent interstitial markings could represent underlying edema.  There are persistent interstitial and airspace densities in the right mid and lower lung region.  Heart size remains  enlarged.  Again noted is a right dual lead cardiac pacemaker.  Left subclavian Port-A-Cath in the SVC region.  Aortic arch is heavily calcified.  IMPRESSION:  Prominent interstitial markings may represent underlying edema. Again noted are increased densities in the right mid and lower lung region which could represent asymmetric edema but cannot exclude an infectious etiology.   Original Report Authenticated By: Richarda Overlie, [name]   Dg Chest Port 1 View  08/29/2012   *RADIOLOGY REPORT*  Clinical Data: Follow-up CHF  PORTABLE CHEST - 1 VIEW  Comparison: Prior chest x-ray 08/27/2012  Findings: Stable position of left subclavian central venous catheter with the tip in the mid superior vena cava.  Right subclavian approach cardiac rhythm maintenance device with leads in the right atrium and right ventricle.  Slightly improved inspiratory volumes with clearing in the right base.  Stable elevation of the left hemidiaphragm resulting in blunting of the left costophrenic angle.  Decreasing interstitial edema with the residual asymmetric patchy opacity in the right mid and lower lobe. Small residual right pleural effusion.  Stable cardiomegaly. Aortic atherosclerosis again noted.  No pneumothorax.  IMPRESSION:  1.  Decreasing pulmonary edema with improved inspiratory volumes and clearing in the right base. 2.  Residual asymmetric patchy opacity in the right mid and lower lung may reflect residual asymmetric alveolar edema, or pneumonia. 3.  Persistent small right pleural effusion, chronic elevation of the left hemidiaphragm and  cardiomegaly.   Original Report Authenticated By: Malachy Moan, [name]   Dg Chest Portable 1 View  08/27/2012   *RADIOLOGY REPORT*  Clinical Data: Shortness of breath.  History of lung cancer post lobectomy.  PORTABLE CHEST - 1 VIEW  Comparison: 02/02/2012  Findings: Shallow inspiration.  Cardiac enlargement with pulmonary vascular congestion and perihilar edema.  Bilateral pleural effusions.  Atelectasis in the lung bases.  No pneumothorax. Changes have developed since the previous study.  Postoperative changes in the right chest.  Stable appearance of cardiac pacemaker and left central venous catheter.  Calcified and tortuous aorta.  IMPRESSION: Interval development of cardiac enlargement, pulmonary vascular congestion, perihilar edema, and bilateral pleural effusions.   Original Report Authenticated By: Burman Nieves, [name]    Jeoffrey Massed, MD  [facility] Pager:[phone]  If 7PM-7AM, please contact night-coverage [email or web address] Password [facility] 09/06/2012, 10:51 AM   LOS: 10 days

## 2012-09-06 NOTE — Progress Notes (Signed)
SUBJECTIVE:  Had hemoptysis this am  OBJECTIVE:   Vitals:   Filed Vitals:   09/05/12 1722 09/05/12 2043 09/06/12 0511 09/06/12 0817  BP: 135/70  147/93 166/99  Pulse: 77 79 87 78  Temp:   97.8 F (36.6 C)   TempSrc:   Axillary   Resp:  20 19   Height:      Weight:   98.5 kg (217 lb 2.5 oz)   SpO2:  96% 92%    I&O's:   Intake/Output Summary (Last 24 hours) at 09/06/12 0837 Last data filed at 09/06/12 0600  Gross per 24 hour  Intake   1128 ml  Output   2975 ml  Net  -1847 ml   TELEMETRY: Reviewed telemetry pt in atrial fibrillation     PHYSICAL EXAM General: Well developed, well nourished, in no acute distress Head: Eyes PERRLA, No xanthomas.   Normal cephalic and atramatic  Lungs:   Crackles at bases Heart:   HRRR S1 S2 Pulses are 2+ & equal. Abdomen: Bowel sounds are positive, abdomen soft and non-tender without masses  Extremities:   Pedal edema Neuro: Alert and oriented X 3. Psych:  Good affect, responds appropriately   LABS: Basic Metabolic Panel:  Recent Labs  16/10/96 0500 09/06/12 0545  NA 139 137  K 3.5 4.0  CL 99 99  CO2 28 30  GLUCOSE 146* 131*  BUN 67* 64*  CREATININE 2.14* 2.15*  CALCIUM 8.2* 8.5   Liver Function Tests: No results found for this basename: AST, ALT, ALKPHOS, BILITOT, PROT, ALBUMIN,  in the last 72 hours No results found for this basename: LIPASE, AMYLASE,  in the last 72 hours CBC:  Recent Labs  09/04/12 0515 09/05/12 0500  WBC 19.0* 19.0*  NEUTROABS  --  16.4*  HGB 10.9* 12.4*  HCT 32.6* 36.1*  MCV 91.1 91.9  PLT 306 386   Coag Panel:   Lab Results  Component Value Date   INR 1.04 08/27/2012   INR 1.09 02/09/2009   INR 1.11 02/08/2009    RADIOLOGY: Dg Chest 2 View  09/01/2012   *RADIOLOGY REPORT*  Clinical Data: Shortness of breath.  CHEST - 2 VIEW  Comparison: 08/31/2012.  Findings: The left subclavian Port-A-Cath tip it is most likely in the mid SVC at the level of the carina.  Stable pacer wires.  The heart  is enlarged but unchanged.  Persistent bilateral infiltrates and effusions.  IMPRESSION:  1.  The Port-A-Cath tip appears to be in the mid SVC. 2.  Persistent bilateral infiltrates and small effusions.   Original Report Authenticated By: Rudie Meyer, [name]   Ct Chest Wo Contrast  08/30/2012   *RADIOLOGY REPORT*  Clinical Data: Hemoptysis.  Pneumonia versus edema.  CT CHEST WITHOUT CONTRAST  Technique:  Multidetector CT imaging of the chest was performed following the standard protocol without IV contrast.  Comparison: Chest radiograph 08/30/2012 and chest CT 02/05/2012  Findings: Heart is enlarged.  Cardiomegaly appears stable. Stable prominent fat density, consistent with lipoma, between the left and right atria.  Dual lead pacer is present.  There is heavy coronary artery atherosclerotic calcification of all three coronary arteries.  Aortic valvular calcifications are present.  A left IJ central venous catheter terminates in the superior vena cava. Thyroid gland and thoracic inlet appear within normal limits.  Heavy atherosclerotic calcification of the thoracic aorta, without aneurysm.  Several mediastinal lymph nodes are visualized, and within normal limits for size.  No pathologically enlarged lymph nodes are seen  in the thorax.  Evaluation for the hilar lymph nodes is somewhat limited without intravenous contrast.  Small bilateral pleural effusions are present.  Multilevel degenerative changes of the thoracic spine.  Multiple benign hemangiomas in the thoracic spine vertebral bodies.  No acute or suspicious osseous abnormality.  No acute findings are identified in the abdomen.  Surgical clips are seen in the left upper quadrant.  There are postsurgical changes of the right lower lobectomy.  There is a background of centrilobular emphysema.  Stable scarring in the right upper lobe anteriorly.  No definite intralobular septal thickening.  There is markedly asymmetric airspace disease in the inferior and  posterior aspect of the right upper lobe, where there are areas of consolidation.  There are ground-glass opacities which are new compared to prior chest CT in the left upper lobe, left lower lobe, and right middle lobe.  5 mm pulmonary nodule in the left upper lobe peripherally on image #27 of the lung windows is new.  IMPRESSION:  1.  Markedly asymmetric bilateral airspace disease.  Airspace disease is most prominent, with areas of consolidation, in the posterior and inferior right upper lobe ( the patient has a history of right lower lobectomy).  Findings in the right upper lobe are favored to be due to pneumonia or aspiration. 2.  New ground-glass opacities in the left upper lobe and left lower lobe may be due to early infection.  Underlying mild pulmonary edema cannot be excluded in this patient with cardiomegaly and small bilateral pleural effusions. 3.  New 5 mm pulmonary nodule left upper lobe. If the patient is at high risk for bronchogenic carcinoma, follow-up chest CT at 6-12 months is recommended.  If the patient is at low risk for bronchogenic carcinoma, follow-up chest CT at 12 months is recommended.  This recommendation follows the consensus statement: Guidelines for Management of Small Pulmonary Nodules Detected on CT Scans: A Statement from the Fleischner Society as published in Radiology 2005; 237:395-400. 4.  Cardiomegaly with three-vessel coronary artery disease and dual lead cardiac pacer. 5.  Small bilateral pleural effusions 6.  Aortic valve calcifications. This finding raises the possibility of aortic valvular stenosis.  .   Original Report Authenticated By: Britta Mccreedy, [name]   Dg Chest Port 1 View  08/31/2012   *RADIOLOGY REPORT*  Clinical Data: Follow-up edema  PORTABLE CHEST - 1 VIEW  Comparison: Yesterday  Findings: The tip of the left subclavian Port-A-Cath has changed its orientation.  Based on a single view, it is likely in the azygos vein.  Right subclavian dual lead pacemaker  device and leads are stable and intact.  Bilateral pleural effusions left greater than right are stable.  Diffuse edema is stable. Cardiomegaly.  IMPRESSION: Left subclavian Port-A-Cath tip has changed its position and is likely in the azygos vein.  This can be confirmed with a lateral view.  Stable airspace disease and pleural effusions.   Original Report Authenticated By: Jolaine Click, [name]   Dg Chest Port 1 View  08/30/2012   *RADIOLOGY REPORT*  Clinical Data: Respiratory failure.  PORTABLE CHEST - 1 VIEW  Comparison: 08/29/2012 and 08/27/2012  Findings: Prominent interstitial markings could represent underlying edema.  There are persistent interstitial and airspace densities in the right mid and lower lung region.  Heart size remains enlarged.  Again noted is a right dual lead cardiac pacemaker.  Left subclavian Port-A-Cath in the SVC region.  Aortic arch is heavily calcified.  IMPRESSION:  Prominent interstitial markings may represent underlying  edema. Again noted are increased densities in the right mid and lower lung region which could represent asymmetric edema but cannot exclude an infectious etiology.   Original Report Authenticated By: Richarda Overlie, [name]   Dg Chest Port 1 View  08/29/2012   *RADIOLOGY REPORT*  Clinical Data: Follow-up CHF  PORTABLE CHEST - 1 VIEW  Comparison: Prior chest x-ray 08/27/2012  Findings: Stable position of left subclavian central venous catheter with the tip in the mid superior vena cava.  Right subclavian approach cardiac rhythm maintenance device with leads in the right atrium and right ventricle.  Slightly improved inspiratory volumes with clearing in the right base.  Stable elevation of the left hemidiaphragm resulting in blunting of the left costophrenic angle.  Decreasing interstitial edema with the residual asymmetric patchy opacity in the right mid and lower lobe. Small residual right pleural effusion.  Stable cardiomegaly. Aortic atherosclerosis again noted.  No  pneumothorax.  IMPRESSION:  1.  Decreasing pulmonary edema with improved inspiratory volumes and clearing in the right base. 2.  Residual asymmetric patchy opacity in the right mid and lower lung may reflect residual asymmetric alveolar edema, or pneumonia. 3.  Persistent small right pleural effusion, chronic elevation of the left hemidiaphragm and cardiomegaly.   Original Report Authenticated By: Malachy Moan, [name]   Dg Chest Portable 1 View  08/27/2012   *RADIOLOGY REPORT*  Clinical Data: Shortness of breath.  History of lung cancer post lobectomy.  PORTABLE CHEST - 1 VIEW  Comparison: 02/02/2012  Findings: Shallow inspiration.  Cardiac enlargement with pulmonary vascular congestion and perihilar edema.  Bilateral pleural effusions.  Atelectasis in the lung bases.  No pneumothorax. Changes have developed since the previous study.  Postoperative changes in the right chest.  Stable appearance of cardiac pacemaker and left central venous catheter.  Calcified and tortuous aorta.  IMPRESSION: Interval development of cardiac enlargement, pulmonary vascular congestion, perihilar edema, and bilateral pleural effusions.   Original Report Authenticated By: Burman Nieves, [name]     Principal Problem:  Acute-on-chronic respiratory failure  Active Problems:  CARCINOMA, LUNG, SQUAMOUS CELL  CHRONIC OBSTRUCTIVE PULMONARY DISEASE, SEVERE  HTN (hypertension)  DM (diabetes mellitus) type II controlled with renal manifestation  Atrial fibrillation - rate controlled  CKD (chronic kidney disease), stage III  Cellulitis of left ankle  Acute on chronic diastolic congestive heart failure  Leukocytosis  Elevation of cardiac enzymes  Hemoptysis - coughed up large blood clot this am Pneumonia due to Gram-negative bacteria  Hypokalemia--repleted  - contnue ongoing IV diuresis until CR bumps ( creatinine remains around 2.1) - still with crackles at bases and pedal edema - initially recommended restart apixiban on  6/25 per pulmonary and TEE DCCV the week to follow but now with recurrent hemoptysis this may not be possible - will defer to pulmonary - continue Jessica Priest, MD  09/06/2012  8:37 AM

## 2012-09-06 NOTE — Progress Notes (Signed)
Lynch, NP was notified that patient is developing rash in his groin area as well as buttocks and lower back.  The patient has not been very moist considering he has a condom cath.  Benadryl was ordered, and the NP on call stated it was ok to place EPC cream on affected areas.  These areas have been marked to determine further growth of rash.  Will continue to monitor patient.

## 2012-09-07 LAB — GLUCOSE, CAPILLARY
Glucose-Capillary: 161 mg/dL — ABNORMAL HIGH (ref 70–99)
Glucose-Capillary: 123 mg/dL — ABNORMAL HIGH (ref 70–99)
Glucose-Capillary: 202 mg/dL — ABNORMAL HIGH (ref 70–99)

## 2012-09-07 LAB — CBC
MCHC: 33.1 g/dL (ref 30.0–36.0)
Platelets: 337 10*3/uL (ref 150–400)
RDW: 15.4 % (ref 11.5–15.5)
WBC: 21.4 10*3/uL — ABNORMAL HIGH (ref 4.0–10.5)

## 2012-09-07 LAB — BASIC METABOLIC PANEL
Chloride: 102 mEq/L (ref 96–112)
Creatinine, Ser: 2.23 mg/dL — ABNORMAL HIGH (ref 0.50–1.35)
GFR calc Af Amer: 31 mL/min — ABNORMAL LOW (ref >90)
GFR calc non Af Amer: 27 mL/min — ABNORMAL LOW (ref >90)
Potassium: 3.3 mEq/L — ABNORMAL LOW (ref 3.5–5.1)

## 2012-09-07 MED ORDER — GLYCERIN (LAXATIVE) 2.1 G RE SUPP
1.0000 | Freq: Every day | RECTAL | Status: DC | PRN
  Filled 2012-09-07: qty 1

## 2012-09-07 MED ORDER — ALPRAZOLAM 0.5 MG PO TABS
0.5000 mg | ORAL_TABLET | Freq: Once | ORAL | Status: AC
  Administered 2012-09-07: 0.5 mg via ORAL
  Filled 2012-09-07: qty 1

## 2012-09-07 MED ORDER — SENNOSIDES-DOCUSATE SODIUM 8.6-50 MG PO TABS
1.0000 | ORAL_TABLET | Freq: Every day | ORAL | Status: DC
  Administered 2012-09-07 – 2012-09-10 (×4): 1 via ORAL
  Filled 2012-09-07 (×4): qty 1

## 2012-09-07 MED ORDER — WHITE PETROLATUM GEL
Status: AC
  Administered 2012-09-07: 0.2
  Filled 2012-09-07: qty 5

## 2012-09-07 MED ORDER — BARRIER CREAM NON-SPECIFIED
1.0000 "application " | TOPICAL_CREAM | Freq: Two times a day (BID) | TOPICAL | Status: DC
  Administered 2012-09-07 – 2012-09-15 (×13): 1 via TOPICAL
  Filled 2012-09-07 (×3): qty 1

## 2012-09-07 MED ORDER — POTASSIUM CHLORIDE CRYS ER 20 MEQ PO TBCR
20.0000 meq | EXTENDED_RELEASE_TABLET | Freq: Two times a day (BID) | ORAL | Status: AC
  Administered 2012-09-07 (×2): 20 meq via ORAL
  Filled 2012-09-07 (×2): qty 1

## 2012-09-07 MED ORDER — VALACYCLOVIR HCL 500 MG PO TABS
1000.0000 mg | ORAL_TABLET | Freq: Three times a day (TID) | ORAL | Status: AC
  Administered 2012-09-07 – 2012-09-14 (×21): 1000 mg via ORAL
  Filled 2012-09-07 (×22): qty 2

## 2012-09-07 NOTE — Progress Notes (Signed)
PCCM Interval Note  I agree with waiting another 2-3 days before restarting anticoagulation since he did have a small amt hemoptysis on 6/22. I will follow with you.   Levy Pupa, MD, PhD 09/07/2012, 2:03 PM [facility] [phone] or if no answer [phone]

## 2012-09-07 NOTE — Consult Note (Signed)
[facility]  Total days of antibiotics 13        Day 4 imipenem               Reason for Consult: rash    Referring Physician: ghimire  Principal Problem:   Acute-on-chronic respiratory failure Active Problems:   CARCINOMA, LUNG, SQUAMOUS CELL   CHRONIC OBSTRUCTIVE PULMONARY DISEASE, SEVERE   HTN (hypertension)   DM (diabetes mellitus) type II controlled with renal manifestation   Atrial fibrillation   CKD (chronic kidney disease), stage III   Cellulitis of left ankle   Acute on chronic diastolic congestive heart failure   Leukocytosis   Elevation of cardiac enzymes   Hemoptysis   Pneumonia due to Gram-negative bacteria    HPI: [name] Newcomer Sr. is a 79 y.o. male 79 yo male former smoker  CAD s/p pacemaker,  NSCLCa s/p RULectomy and adjuvant chemo in 2006, has severe COPD on home O2, OSA on BIPAP, presented with severe dyspnea/hemoptysis from acute pulmonary edema, acute diastolic dysfunction, and new onset A fib with RVR. Marland Kitchen He was recently treated as outpt for Lt ankle cellulitis. He was admitted on 6/12 felt his hemoptysis was due to xarelto which has been stopped but also treated for pneumonia due to chest CT findings and new onset cough.  CT showed Markedly asymmetric bilateral airspace disease. Airspace disease is most prominent, with areas of consolidation, in the posterior and inferior right upper lobe ( the patient has a history of right lower lobectomy). Findings in the right upper lobe are favored to be due to pneumonia or aspiration.  New ground-glass opacities in the left upper lobe and left lower lobe may be due to early infection. He was initially treated with levofloxacin but BaL culture revealed ESBL ecoli collected on 6/17. He is on day # 4 of imipenem, but while on this antibiotic he started to have a rash to left buttock, but also worsening rash to scrotum, groin area as well as plantar aspects of both feet. Primary team is concerned about  drug rash.     Past Medical History  Diagnosis Date  . Stricture and stenosis of esophagus 12/07/2004    EGD done on 12/07/2004  . GERD (gastroesophageal reflux disease)   . Hypoxemia     With chronic respiratory failure  . Increased prostate specific antigen (PSA) velocity 02/26/2011  . DM (diabetes mellitus) 02/26/2011  . Gout 02/26/2011  . COPD (chronic obstructive pulmonary disease)     oxygen at home, 4L  . OSA (obstructive sleep apnea) 02/26/2011    CPAP  . Paroxysmal atrial fibrillation   . Depression   . Cardiac arrest 2009    while in hospital   . Hypertension   . HTN (hypertension) 02/26/2011  . Cardiac arrest 2009    while in hospital  . Lung cancer     lung s/p lobectomy x2 on RT lung  . Basal cell carcinoma of nose 02/26/2011  . ETOH abuse 01/29/2012    started drinking again - hx cardiac arrest in 2009 due to dt's with organ failure  . CKD (chronic kidney disease) 02/28/2012  . Chronic diastolic CHF (congestive heart failure)     a. EF 55-60% by echo 2013.  . Sick sinus syndrome     a. s/p [employer] pacemaker 2010.  . Bradycardia     a. Admitted 2010 with severe bradycardia, runs of paroxysmal VT.   Marland Kitchen Paroxysmal VT  a. In 2010 in setting of marked bradycardia.  Marland Kitchen CAD (coronary artery disease)     a. Nonobstructive CAD by cath 2006.  Marland Kitchen Esophageal ulcer     a. By EGD 10/2011.  Marland Kitchen Hiatal hernia     a. By EGD 10/2011.  . Valvular heart disease     a. Mild MR/mild AI/mild AS by echo 05/2011.    Allergies: No Known Allergies   MEDICATIONS: . amiodarone  400 mg Oral BID  . arformoterol  15 mcg Nebulization BID  . atorvastatin  40 mg Oral QHS  . barrier cream  1 application Topical BID  . budesonide (PULMICORT) nebulizer solution  0.5 mg Nebulization BID  . citalopram  10 mg Oral Daily  . diltiazem  180 mg Oral BID  . diphenhydrAMINE  25 mg Oral Q6H  . ezetimibe  10 mg Oral Daily  . furosemide  120 mg Intravenous BID  . guaiFENesin  1,200 mg Oral  BID  . hydrALAZINE  50 mg Oral BID  . imipenem-cilastatin  250 mg Intravenous Q6H  . insulin aspart  0-9 Units Subcutaneous TID WC  . insulin glargine  10 Units Subcutaneous QHS  . magnesium oxide  400 mg Oral Daily  . metoprolol succinate  25 mg Oral BID WC  . multivitamin  1 tablet Oral Daily  . pantoprazole  40 mg Oral Daily  . potassium chloride SA  20 mEq Oral BID  . predniSONE  20 mg Oral Daily  . senna-docusate  1 tablet Oral QHS  . sodium chloride  3 mL Intravenous Q12H  . tiotropium  18 mcg Inhalation Daily  . valACYclovir  1,000 mg Oral TID  . white petrolatum        History  Substance Use Topics  . Smoking status: Former Smoker -- 1.50 packs/day for 50 years    Types: Cigarettes    Quit date: 06/28/1996  . Smokeless tobacco: Current User    Types: Chew  . Alcohol Use: Yes     Comment: 2 beers 2-3 times /week    Family History  Problem Relation Age of Onset  . Heart disease Father   . Lung cancer Mother   . Cancer Other     lung cancer  . Heart disease Other   . Hypertension Other     Review of Systems   Constitutional: Negative for fever, chills, diaphoresis, activity change, appetite change, fatigue and unexpected weight change.  HENT: Negative for congestion, sore throat, rhinorrhea, sneezing, trouble swallowing and sinus pressure.  Eyes: Negative for photophobia and visual disturbance.  Respiratory: still has mild blood tinged cough.Negative for cough, chest tightness, shortness of breath, wheezing and stridor.  Cardiovascular: Negative for chest pain, palpitations and leg swelling.  Gastrointestinal: Negative for nausea, vomiting, abdominal pain, diarrhea, constipation, blood in stool, abdominal distention and anal bleeding.  Genitourinary: Negative for dysuria, hematuria, flank pain and difficulty urinating.  Musculoskeletal: Negative for myalgias, back pain, joint swelling, arthralgias and gait problem.  Skin: slightly painful pruritic rash to  buttock Neurological: Negative for dizziness, tremors, weakness and light-headedness.  Hematological: Negative for adenopathy. Does not bruise/bleed easily.  Psychiatric/Behavioral: Negative for behavioral problems, confusion, sleep disturbance, dysphoric mood, decreased concentration and agitation.      OBJECTIVE: Temp:  [97.3 F (36.3 C)-98.4 F (36.9 C)] 98.4 F (36.9 C) (06/23 0545) Pulse Rate:  [71-104] 71 (06/23 0545) Resp:  [18-20] 18 (06/23 0545) BP: (126-164)/(68-95) 126/68 mmHg (06/23 0545) SpO2:  [90 %-97 %] 96 % (  06/23 0944) Weight:  [208 lb 8.9 oz (94.6 kg)] 208 lb 8.9 oz (94.6 kg) (06/23 0700) Physical Exam  Constitutional: oriented to person, place, and time. He appears well-developed and well-nourished. No distress.  HENT:  Mouth/Throat: Oropharynx is clear and moist. No oropharyngeal exudate.  Cardiovascular: Normal rate, regular rhythm and normal heart sounds. Exam reveals no gallop and no friction rub.  No murmur heard.  Pulmonary/Chest: Effort normal and breath sounds normal. No respiratory distress. He has no wheezes.  Abdominal: Soft. Bowel sounds are normal. He exhibits no distension. There is no tenderness.  Lymphadenopathy:  no cervical adenopathy.  Neurological: He is alert and oriented to person, place, and time.  Skin: he has patch of small clusters of papules to left buttocks with scattered lesions. Non are vesicular.  GU = groin and scrotum erythamatous plaque Ext: erythema to plantar aspect of both feet Psychiatric: He has a normal mood and affect. His behavior is normal.    LABS: Results for orders placed during the hospital encounter of 08/27/12 (from the past 48 hour(s))  GLUCOSE, CAPILLARY     Status: Abnormal   Collection Time    09/05/12  4:17 PM      Result Value Range   Glucose-Capillary 301 (*) 70 - 99 mg/dL  GLUCOSE, CAPILLARY     Status: Abnormal   Collection Time    09/05/12  9:33 PM      Result Value Range   Glucose-Capillary  226 (*) 70 - 99 mg/dL   Comment 1 Documented in Chart     Comment 2 Notify RN    BASIC METABOLIC PANEL     Status: Abnormal   Collection Time    09/06/12  5:45 AM      Result Value Range   Sodium 137  135 - 145 mEq/L   Potassium 4.0  3.5 - 5.1 mEq/L   Chloride 99  96 - 112 mEq/L   CO2 30  19 - 32 mEq/L   Glucose, Bld 131 (*) 70 - 99 mg/dL   BUN 64 (*) 6 - 23 mg/dL   Creatinine, Ser 5.57 (*) 0.50 - 1.35 mg/dL   Calcium 8.5  8.4 - 32.2 mg/dL   GFR calc non Af Amer 28 (*) >90 mL/min   GFR calc Af Amer 32 (*) >90 mL/min   Comment:            The eGFR has been calculated     using the CKD EPI equation.     This calculation has not been     validated in all clinical     situations.     eGFR's persistently     <90 mL/min signify     possible Chronic Kidney Disease.  PRO B NATRIURETIC PEPTIDE     Status: Abnormal   Collection Time    09/06/12  5:45 AM      Result Value Range   Pro B Natriuretic peptide (BNP) 1793.0 (*) 0 - 450 pg/mL  GLUCOSE, CAPILLARY     Status: Abnormal   Collection Time    09/06/12  7:37 AM      Result Value Range   Glucose-Capillary 106 (*) 70 - 99 mg/dL  GLUCOSE, CAPILLARY     Status: Abnormal   Collection Time    09/06/12 11:42 AM      Result Value Range   Glucose-Capillary 127 (*) 70 - 99 mg/dL  GLUCOSE, CAPILLARY     Status: Abnormal   Collection Time  09/06/12  5:13 PM      Result Value Range   Glucose-Capillary 365 (*) 70 - 99 mg/dL  GLUCOSE, CAPILLARY     Status: Abnormal   Collection Time    09/06/12  9:43 PM      Result Value Range   Glucose-Capillary 108 (*) 70 - 99 mg/dL  CBC     Status: Abnormal   Collection Time    09/07/12  6:40 AM      Result Value Range   WBC 21.4 (*) 4.0 - 10.5 K/uL   RBC 3.57 (*) 4.22 - 5.81 MIL/uL   Hemoglobin 11.0 (*) 13.0 - 17.0 g/dL   HCT 09.8 (*) 11.9 - 14.7 %   MCV 93.0  78.0 - 100.0 fL   MCH 30.8  26.0 - 34.0 pg   MCHC 33.1  30.0 - 36.0 g/dL   RDW 82.9  56.2 - 13.0 %   Platelets 337  150 - 400  K/uL  BASIC METABOLIC PANEL     Status: Abnormal   Collection Time    09/07/12  6:40 AM      Result Value Range   Sodium 139  135 - 145 mEq/L   Potassium 3.3 (*) 3.5 - 5.1 mEq/L   Chloride 102  96 - 112 mEq/L   CO2 28  19 - 32 mEq/L   Glucose, Bld 184 (*) 70 - 99 mg/dL   BUN 64 (*) 6 - 23 mg/dL   Creatinine, Ser 8.65 (*) 0.50 - 1.35 mg/dL   Calcium 8.0 (*) 8.4 - 10.5 mg/dL   GFR calc non Af Amer 27 (*) >90 mL/min   GFR calc Af Amer 31 (*) >90 mL/min   Comment:            The eGFR has been calculated     using the CKD EPI equation.     This calculation has not been     validated in all clinical     situations.     eGFR's persistently     <90 mL/min signify     possible Chronic Kidney Disease.  GLUCOSE, CAPILLARY     Status: Abnormal   Collection Time    09/07/12  7:41 AM      Result Value Range   Glucose-Capillary 161 (*) 70 - 99 mg/dL  GLUCOSE, CAPILLARY     Status: Abnormal   Collection Time    09/07/12 12:33 PM      Result Value Range   Glucose-Capillary 123 (*) 70 - 99 mg/dL    MICRO: 7/84 resp culture: ESBL ecoli 6/12 blood cx NGTD IMAGING: No results found.   Assessment/Plan:  79yo M with multiple co-morbidities being treated for ESBL ecoli pneumonia has varying concominant rashes  Rash to groin c/w candidal infection = continue with antifugal cream/powder  Buttock to rash c/w shingles = will start valtrex 1gm TID x 7 days. No need for airborne isolation. Just contact, not disseminated  Rash to feet = unclear what this may be, will continue to monitor to see if any ointments or change in therapy is needed  esbl ecoli pna = treat for 8 days  Zniya Cottone B. Drue Second MD MPH [facility] [phone]

## 2012-09-07 NOTE — Progress Notes (Signed)
Physical Therapy Treatment Patient Details Name: [name] STETZER Sr. MRN: [id number] DOB: October 12, 1932 Today's Date: 09/07/2012 Time: 7829-5621 PT Time Calculation (min): 25 min  PT Assessment / Plan / Recommendation Comments on Treatment Session  Pt making good progress.  Continues with dyspnea when ambulating but improves with seated rest.  Pt educated on seated therex to perform while resting in the chair in his room. Pt and wife are pleased with progress.    Follow Up Recommendations  Home health PT;Supervision/Assistance - 24 hour     Does the patient have the potential to tolerate intense rehabilitation     Barriers to Discharge        Equipment Recommendations       Recommendations for Other Services    Frequency Min 3X/week   Plan Discharge plan remains appropriate;Frequency remains appropriate    Precautions / Restrictions Precautions Precautions: Fall Restrictions Weight Bearing Restrictions: No   Pertinent Vitals/Pain No c/o pain.  SpO2 91-93% during treatment on 3L O2    Mobility  Bed Mobility Supine to Sit: 6: Modified independent (Device/Increase time);HOB elevated Sitting - Scoot to Edge of Bed: 5: Supervision Transfers Sit to Stand: 5: Supervision Stand to Sit: 5: Supervision Details for Transfer Assistance: no cues needed for hand placement, pt able to stand and steady himself with RW with supervision Ambulation/Gait Ambulation/Gait Assistance: 4: Min guard Ambulation Distance (Feet): 100 Feet Assistive device: Rolling walker Ambulation/Gait Assistance Details: 100'x2 with seated rest break in between.  pt with 2/4 dyspnea but resolved with seated rest and cues for deep breathing.  pt with slow cadence with RW but no LOB    Exercises General Exercises - Lower Extremity Ankle Circles/Pumps: AROM;15 reps;Both;Seated Long Arc Quad: AROM;Both;10 reps;Seated Hip ABduction/ADduction: AROM;Both;10 reps;Seated Hip Flexion/Marching: AROM;Both;10 reps;Seated    PT Diagnosis:    PT Problem List:   PT Treatment Interventions:     PT Goals Acute Rehab PT Goals PT Goal: Sit to Stand - Progress: Met PT Goal: Stand to Sit - Progress: Met PT Goal: Ambulate - Progress: Progressing toward goal  Visit Information  Last PT Received On: 09/07/12 Assistance Needed: +1    Subjective Data  Subjective: I'll walk   Cognition  Cognition Arousal/Alertness: Awake/alert Behavior During Therapy: WFL for tasks assessed/performed Overall Cognitive Status: Within Functional Limits for tasks assessed    Balance     End of Session PT - End of Session Equipment Utilized During Treatment: Oxygen Activity Tolerance: Patient tolerated treatment well Patient left: in chair;with family/visitor present;with call bell/phone within reach Nurse Communication: Mobility status   GP     [name] Stevenson 09/07/2012, 10:22 AM

## 2012-09-07 NOTE — Progress Notes (Signed)
ANTIBIOTIC CONSULT NOTE   Pharmacy Consult for Primaxin Indication: rule out pneumonia  No Known Allergies   Labs:  Recent Labs  09/05/12 0500 09/06/12 0545 09/07/12 0640  WBC 19.0*  --  21.4*  HGB 12.4*  --  11.0*  PLT 386  --  337  CREATININE 2.14* 2.15* 2.23*    Assessment: 61 YOM with chronic respiratory failure d/t COPD/CHF, who had completed 9 days of Levaquin for pneumonia, BAL culture (6/17) is positive for ESBL E.Coli.  He is afebrile, wbc elevated at 21, scr 2.23, est. crcl ~ 30 ml/min  Day 4 of Primaxin therapy  Goal of Therapy:  Appropriate Primaxin dosing  Plan:  - Continue  Primaxin 250 mg iv q 6 hrs - f/u renal function  Thank you. Okey Regal, PharmD [phone]  09/07/2012,9:47 AM

## 2012-09-07 NOTE — Progress Notes (Signed)
MD on call notified that patient was having more anxiety than usual after scheduled xanax 0.5mg  administered an hour earlier.  Also, MD was made aware that patient's RUE was weeping.  Orders given.  Will continue to monitor patient.

## 2012-09-07 NOTE — Progress Notes (Signed)
Occupational Therapy Treatment Patient Details Name: [name] DEBRUIN Sr. MRN: [id number] DOB: 09-Jun-1932 Today's Date: 09/07/2012 Time: 4098-1191 OT Time Calculation (min): 32 min  OT Assessment / Plan / Recommendation Comments on Treatment Session Pt making steady progress with OT currently supervision level for selfcare tasks and functional transfers using the RW.  O2 sats 92% on 4Ls with mobility.    Follow Up Recommendations  No OT follow up;Supervision/Assistance - 24 hour       Equipment Recommendations  None recommended by OT          Plan Discharge plan remains appropriate    Precautions / Restrictions Precautions Precautions: Fall Precaution Comments: O2 dependent Restrictions Weight Bearing Restrictions: No   Pertinent Vitals/Pain No report of pain HR 82 BPM and O2 sats 92% on 4Ls during mobility    ADL  Grooming: Simulated;Supervision/safety Where Assessed - Grooming: Supported standing Toilet Transfer: Buyer, retail Method: Other (comment) (ambulate with RW) Acupuncturist: Comfort height toilet;Grab bars Toileting - Clothing Manipulation and Hygiene: Simulated;Min guard Where Assessed - Toileting Clothing Manipulation and Hygiene: Sit to stand from 3-in-1 or toilet Equipment Used: Rolling walker Transfers/Ambulation Related to ADLs: ambulated with RW and min guard assist and O2. ADL Comments: Pt and wife educated on energy conservation strategies of using luke warm water in the shower to reduce steam and help with increasing breathing.  Also discuassed use of a terry cloth robe to assist with drying pt off and crossing LEs for dressing instead of bending down to the floor.  Pt doe exhibit difficulty with crossing LEs but was able to simulate and reach to the floor.  Wife reports having AE at home as well fi needed.      OT Goals Acute Rehab OT Goals OT Goal Formulation: With patient Potential to Achieve Goals: Good ADL  Goals Pt Will Perform Upper Body Bathing: with modified independence;Sitting, chair Pt Will Perform Lower Body Bathing: with modified independence;Sit to stand from chair Pt Will Perform Upper Body Dressing: with modified independence;Sitting, chair;Sitting, bed Pt Will Perform Lower Body Dressing: with modified independence;Sit to stand from chair;Sit to stand from bed Pt Will Transfer to Toilet: with modified independence ADL Goal: Toilet Transfer - Progress: Progressing toward goals Pt Will Perform Toileting - Clothing Manipulation: Independently ADL Goal: Toileting - Clothing Manipulation - Progress: Progressing toward goals Pt Will Perform Toileting - Hygiene: Independently ADL Goal: Toileting - Hygiene - Progress: Progressing toward goals Additional ADL Goal #1: Patient will be independent with energy conservation techniques for ADLS. ADL Goal: Additional Goal #1 - Progress: Progressing toward goals  Visit Information  Last OT Received On: 09/07/12 Assistance Needed: +1    Subjective Data  Subjective: I've been doing better. Patient Stated Goal: Pt did not state.      Cognition  Cognition Arousal/Alertness: Awake/alert Behavior During Therapy: WFL for tasks assessed/performed Overall Cognitive Status: Within Functional Limits for tasks assessed    Mobility  Transfers Transfers: Sit to Stand;Stand to Sit Sit to Stand: 4: Min guard;With armrests;With upper extremity assist;From chair/3-in-1 Stand to Sit: 4: Min guard;With upper extremity assist;With armrests;To chair/3-in-1 Details for Transfer Assistance: Pt needs min instructional cueing for hand placement with sit to stand.       Balance Balance Balance Assessed: Yes Static Standing Balance Static Standing - Balance Support: Right upper extremity supported;Left upper extremity supported Static Standing - Level of Assistance: 5: Stand by assistance   End of Session OT - End of Session Activity Tolerance: Patient  limited by fatigue Patient left: in chair;with call bell/phone within reach;with nursing in room;with family/visitor present Nurse Communication: Mobility status    Keath Matera OTR/L Pager number [phone] 09/07/2012, 4:34 PM

## 2012-09-07 NOTE — Progress Notes (Signed)
Patient Name: [name] NOVICK Sr.      SUBJECTIVE: sob some better, still with some hemoptysis and edema NO FLUID RESTRICATION OR MEASUREMENTS  Past Medical History  Diagnosis Date  . Stricture and stenosis of esophagus 12/07/2004    EGD done on 12/07/2004  . GERD (gastroesophageal reflux disease)   . Hypoxemia     With chronic respiratory failure  . Increased prostate specific antigen (PSA) velocity 02/26/2011  . DM (diabetes mellitus) 02/26/2011  . Gout 02/26/2011  . COPD (chronic obstructive pulmonary disease)     oxygen at home, 4L  . OSA (obstructive sleep apnea) 02/26/2011    CPAP  . Paroxysmal atrial fibrillation   . Depression   . Cardiac arrest 2009    while in hospital   . Hypertension   . HTN (hypertension) 02/26/2011  . Cardiac arrest 2009    while in hospital  . Lung cancer     lung s/p lobectomy x2 on RT lung  . Basal cell carcinoma of nose 02/26/2011  . ETOH abuse 01/29/2012    started drinking again - hx cardiac arrest in 2009 due to dt's with organ failure  . CKD (chronic kidney disease) 02/28/2012  . Chronic diastolic CHF (congestive heart failure)     a. EF 55-60% by echo 2013.  . Sick sinus syndrome     a. s/p [employer] pacemaker 2010.  . Bradycardia     a. Admitted 2010 with severe bradycardia, runs of paroxysmal VT.   Marland Kitchen Paroxysmal VT     a. In 2010 in setting of marked bradycardia.  Marland Kitchen CAD (coronary artery disease)     a. Nonobstructive CAD by cath 2006.  Marland Kitchen Esophageal ulcer     a. By EGD 10/2011.  Marland Kitchen Hiatal hernia     a. By EGD 10/2011.  . Valvular heart disease     a. Mild MR/mild AI/mild AS by echo 05/2011.    Scheduled Meds:  Scheduled Meds: . amiodarone  400 mg Oral BID  . arformoterol  15 mcg Nebulization BID  . atorvastatin  40 mg Oral QHS  . budesonide (PULMICORT) nebulizer solution  0.5 mg Nebulization BID  . citalopram  10 mg Oral Daily  . diltiazem  180 mg Oral BID  . diphenhydrAMINE  25 mg Oral Q6H  . ezetimibe  10 mg Oral  Daily  . furosemide  120 mg Intravenous BID  . guaiFENesin  1,200 mg Oral BID  . hydrALAZINE  50 mg Oral BID  . imipenem-cilastatin  250 mg Intravenous Q6H  . insulin aspart  0-9 Units Subcutaneous TID WC  . insulin glargine  10 Units Subcutaneous QHS  . magnesium oxide  400 mg Oral Daily  . metoprolol succinate  25 mg Oral BID WC  . multivitamin  1 tablet Oral Daily  . pantoprazole  40 mg Oral Daily  . predniSONE  20 mg Oral Daily  . sodium chloride  3 mL Intravenous Q12H  . tiotropium  18 mcg Inhalation Daily   Continuous Infusions:   PHYSICAL EXAM Filed Vitals:   09/06/12 2100 09/06/12 2344 09/07/12 0545 09/07/12 0700  BP: 164/95  126/68   Pulse: 87 92 71   Temp: 97.7 F (36.5 C)  98.4 F (36.9 C)   TempSrc: Oral  Oral   Resp: 20 18 18    Height:      Weight:    208 lb 8.9 oz (94.6 kg)  SpO2: 97%  93%     General appearance:  alert and cooperative on CPAP Lungs: lung coarse anterior and laterally Heart: irregularly irregular rhythm Abdomen: soft, non-tender; bowel sounds normal; no masses,  no organomegaly Extremities: edema 2+ Skin: Skin color, texture, turgor normal. No rashes or lesions Neurologic: Grossly normal Alert and oriented  TELEMETRY: Reviewed telemetry pt in  afib:    Intake/Output Summary (Last 24 hours) at 09/07/12 0746 Last data filed at 09/07/12 0600  Gross per 24 hour  Intake    880 ml  Output   2150 ml  Net  -1270 ml    and weights are discrepant  LABS: Basic Metabolic Panel:  Recent Labs Lab 09/01/12 0454 09/02/12 1245 09/03/12 0527 09/04/12 0515 09/05/12 0500 09/06/12 0545 09/07/12 0640  NA 136 134* 132* 137 139 137 139  K 3.6 3.7 3.2* 3.2* 3.5 4.0 3.3*  CL 96 92* 92* 100 99 99 102  CO2 30 29 28 25 28 30 28   GLUCOSE 207* 249* 187* 237* 146* 131* 184*  BUN 70* 80* 81* 72* 67* 64* 64*  CREATININE 2.33* 2.54* 2.39* 2.09* 2.14* 2.15* 2.23*  CALCIUM 8.9 8.5 8.3* 7.7* 8.2* 8.5 8.0*   Cardiac Enzymes: No results found for this  basename: CKTOTAL, CKMB, CKMBINDEX, TROPONINI,  in the last 72 hours CBC:  Recent Labs Lab 09/01/12 0454 09/02/12 1245 09/03/12 0527 09/04/12 0515 09/05/12 0500 09/07/12 0640  WBC 14.7* 18.5* 19.3* 19.0* 19.0* 21.4*  NEUTROABS  --  16.5*  --   --  16.4*  --   HGB 11.3* 12.1* 11.8* 10.9* 12.4* 11.0*  HCT 33.9* 35.1* 35.2* 32.6* 36.1* 33.2*  MCV 91.6 91.2 91.7 91.1 91.9 93.0  PLT 362 369 387 306 386 337   PROTIME: No results found for this basename: LABPROT, INR,  in the last 72 hours Liver Function Tests: No results found for this basename: AST, ALT, ALKPHOS, BILITOT, PROT, ALBUMIN,  in the last 72 hours No results found for this basename: LIPASE, AMYLASE,  in the last 72 hours BNP: BNP (last 3 results)  Recent Labs  08/28/12 0500 08/31/12 0500 09/06/12 0545  PROBNP 6086.0* 7566.0* 1793.0*     ASSESSMENT AND PLAN:  Principal Problem:   Acute-on-chronic respiratory failure Active Problems:   CARCINOMA, LUNG, SQUAMOUS CELL   CHRONIC OBSTRUCTIVE PULMONARY DISEASE, SEVERE   HTN (hypertension)   DM (diabetes mellitus) type II controlled with renal manifestation   Atrial fibrillation   CKD (chronic kidney disease), stage III   Cellulitis of left ankle   Acute on chronic diastolic congestive heart failure   Leukocytosis   Elevation of cardiac enzymes   Hemoptysis   Pneumonia due to Gram-negative bacteria Hypokalemia    Continue to diurese   i dont understand the discrepancy between the weight and I/Os x t  the I's are probably wrong s not measured and according to his wife wide open BNP still is much improved replete K  Fluid restriction Signed, Sherryl Manges MD  09/07/2012

## 2012-09-07 NOTE — Progress Notes (Signed)
PATIENT DETAILS Name: [name] MISTRY Sr. Age: 79 y.o. Sex: male Date of Birth: [birth year] Admit Date: 08/27/2012 Admitting Physician Clydia Llano, MD [id number]:[id number] [name], MD  Subjective: Last hemoptysis 6/22 am.  Wife worried about reddening feet and arms. Patient in good spirits (Smiling) but on CPAP.  Assessment/Plan: Acute-on-chronic respiratory failure with hypoxia due to:  Acute on chronic diastolic heart failure CHF  Acute on CHRONIC OBSTRUCTIVE PULMONARY DISEASE, SEVERE - Continued slow improvement. - Continue with Lasix infusion - strict Is and Os - Cont supportive care with nebs/MDI  - prednisone decreased to 20 mg on 6/22.  slow taper  PNA -BAL positive for ESBL E Coli -completed 9 days of levaquin-stop Levaquin, now on Primaxin-Day 4 -ID consult as patient may be developing an allergic reaction to Primaxin-?alternative  Allergic rxn? -bright red on the plantar portion of feet bilaterally, and buttocks.  Also present but less so on arms, chest and abdomen.  Acute diastolic heart failure - Cardiology following and managing diuresis - Continue with Lasix infusion - More than 16 L negative balance.  Requesting strict Is&Os and Daily weights - Creatinine 2.15 - > 2.23 on 6/23  Atrial fibrillation - Unfortunately off anti-coagulation given hemoptysis, per Dr. Delford Field, okay to resume anticoagulation in one week time, but may need to touch base with [place] given mild recurrent hemoptysis on 6/22 - On metoprolol and Cardizem for rate control -Tolerating Amiodarone well  Hemoptysis -? Etiology- provoked by xarelto-? Recurrence of underlying malignancy - Overall much better-but does recur intermittently -  Status post a bronchoscopy with no endobronchial lesions seen, only old clot seen on bronchoscopy - BAL +ve for ESBL E Coli-now on Primaxin  CKD (chronic kidney disease), stage III  - Creatinine improving. Monitor renal function closely.  Leukocytosis - WBC slowly  climibing (21.4 on 6/23) - Multifactorial from possible pneumonia, on steroids, allergic reaction? - Monitor  Hypertension - Controlled- continue with current medication  Diabetes - Lantus at 10 units, continue with SSI-s  CORONARY ARTERY DISEASE (nonobstructive disease 2006 catheterization)/Elevation of cardiac enzymes  -no CP and likely due to acute HF, demand ischemia from recent hypoxia and worsened renal function - Cardiology following  Cellulitis of left ankle  -has decreased rednessboth legs c/w stasis dermatitis- resolved   NSCLC s/p RULectomy and adjuvant chemo in 2006 - Does have a new lung nodule-will need close followup as an outpatient  Disposition: Remain inpatient  DVT Prophylaxis:  SCD's  Code Status: Full code  Family Communication Spouse at bedside  Procedures:  None  CONSULTS:  cardiology and pulmonary/intensive care Infectious disease.   MEDICATIONS: Scheduled Meds: . amiodarone  400 mg Oral BID  . arformoterol  15 mcg Nebulization BID  . atorvastatin  40 mg Oral QHS  . budesonide (PULMICORT) nebulizer solution  0.5 mg Nebulization BID  . citalopram  10 mg Oral Daily  . diltiazem  180 mg Oral BID  . diphenhydrAMINE  25 mg Oral Q6H  . ezetimibe  10 mg Oral Daily  . furosemide  120 mg Intravenous BID  . guaiFENesin  1,200 mg Oral BID  . hydrALAZINE  50 mg Oral BID  . imipenem-cilastatin  250 mg Intravenous Q6H  . insulin aspart  0-9 Units Subcutaneous TID WC  . insulin glargine  10 Units Subcutaneous QHS  . magnesium oxide  400 mg Oral Daily  . metoprolol succinate  25 mg Oral BID WC  . multivitamin  1 tablet Oral Daily  . pantoprazole  40 mg Oral  Daily  . potassium chloride SA  20 mEq Oral BID  . predniSONE  20 mg Oral Daily  . senna-docusate  1 tablet Oral QHS  . sodium chloride  3 mL Intravenous Q12H  . tiotropium  18 mcg Inhalation Daily   Continuous Infusions:  PRN Meds:.ALPRAZolam, Glycerin (Adult), levalbuterol, morphine  injection, nitroGLYCERIN, ondansetron (ZOFRAN) IV, ondansetron, oxyCODONE, polyvinyl alcohol, sodium chloride, traZODone  Antibiotics: Anti-infectives   Start     Dose/Rate Route Frequency Ordered Stop   09/04/12 1200  imipenem-cilastatin (PRIMAXIN) 250 mg in sodium chloride 0.9 % 100 mL IVPB     250 mg 200 mL/hr over 30 Minutes Intravenous 4 times per day 09/04/12 0856     08/29/12 1200  levofloxacin (LEVAQUIN) tablet 500 mg  Status:  Discontinued     500 mg Oral Every 48 hours 08/29/12 0957 09/04/12 1008   08/27/12 0900  vancomycin (VANCOCIN) 1,250 mg in sodium chloride 0.9 % 250 mL IVPB  Status:  Discontinued     1,250 mg 166.7 mL/hr over 90 Minutes Intravenous Every 24 hours 08/27/12 0826 08/28/12 1809   08/27/12 0900  piperacillin-tazobactam (ZOSYN) IVPB 3.375 g  Status:  Discontinued     3.375 g 12.5 mL/hr over 240 Minutes Intravenous 3 times per day 08/27/12 0826 08/28/12 1809   08/27/12 0600  levofloxacin (LEVAQUIN) IVPB 500 mg     500 mg 100 mL/hr over 60 Minutes Intravenous  Once 08/27/12 0554 08/27/12 0735       PHYSICAL EXAM: Vital signs in last 24 hours: Filed Vitals:   09/06/12 2344 09/07/12 0545 09/07/12 0700 09/07/12 0944  BP:  126/68    Pulse: 92 71    Temp:  98.4 F (36.9 C)    TempSrc:  Oral    Resp: 18 18    Height:      Weight:   94.6 kg (208 lb 8.9 oz)   SpO2:  93%  96%    Weight change: -6.4 kg (-14 lb 1.8 oz) Filed Weights   09/06/12 0511 09/06/12 1110 09/07/12 0700  Weight: 98.5 kg (217 lb 2.5 oz) 92.1 kg (203 lb 0.7 oz) 94.6 kg (208 lb 8.9 oz)   Body mass index is 32.66 kg/(m^2).   Gen Exam: Awake and alert, Pleasant with CPAP in place Neck: Supple, No JVD.   Chest: B/L Clear. No w/c/r  CVS: S1 S2 Regular, no murmurs.  Abdomen: soft, BS +, non tender, non distended.  Extremities: 3+ edema on dorsum of feet bilaterally, lower extremities warm to touch. Plantar portion of feet are bright red. Neurologic: Non Focal.   Skin: No Rash.      Intake/Output from previous day:  Intake/Output Summary (Last 24 hours) at 09/07/12 1028 Last data filed at 09/07/12 0945  Gross per 24 hour  Intake    920 ml  Output   2900 ml  Net  -1980 ml     LAB RESULTS: CBC  Recent Labs Lab 09/02/12 1245 09/03/12 0527 09/04/12 0515 09/05/12 0500 09/07/12 0640  WBC 18.5* 19.3* 19.0* 19.0* 21.4*  HGB 12.1* 11.8* 10.9* 12.4* 11.0*  HCT 35.1* 35.2* 32.6* 36.1* 33.2*  PLT 369 387 306 386 337  MCV 91.2 91.7 91.1 91.9 93.0  MCH 31.4 30.7 30.4 31.6 30.8  MCHC 34.5 33.5 33.4 34.3 33.1  RDW 15.5 15.4 15.3 15.2 15.4  LYMPHSABS 0.8  --   --  1.4  --   MONOABS 1.2*  --   --  1.3*  --  EOSABS 0.0  --   --  0.0  --   BASOSABS 0.0  --   --  0.0  --     Chemistries   Recent Labs Lab 09/03/12 0527 09/04/12 0515 09/05/12 0500 09/06/12 0545 09/07/12 0640  NA 132* 137 139 137 139  K 3.2* 3.2* 3.5 4.0 3.3*  CL 92* 100 99 99 102  CO2 28 25 28 30 28   GLUCOSE 187* 237* 146* 131* 184*  BUN 81* 72* 67* 64* 64*  CREATININE 2.39* 2.09* 2.14* 2.15* 2.23*  CALCIUM 8.3* 7.7* 8.2* 8.5 8.0*    CBG:  Recent Labs Lab 09/06/12 0737 09/06/12 1142 09/06/12 1713 09/06/12 2143 09/07/12 0741  GLUCAP 106* 127* 365* 108* 161*    GFR Estimated Creatinine Clearance: 30.4 ml/min (by C-G formula based on Cr of 2.23).  Urine Studies MICROBIOLOGY: Recent Results (from the past 240 hour(s))  CULTURE, EXPECTORATED SPUTUM-ASSESSMENT     Status: None   Collection Time    08/31/12  8:38 AM      Result Value Range Status   Specimen Description SPUTUM   Final   Special Requests NONE   Final   Sputum evaluation     Final   Value: MICROSCOPIC FINDINGS SUGGEST THAT THIS SPECIMEN IS NOT REPRESENTATIVE OF LOWER RESPIRATORY SECRETIONS. PLEASE RECOLLECT.     CALLED TO Mackie Pai 08/31/12 0913 BY K SCHULTZ   Report Status 08/31/2012 FINAL   Final  CULTURE, EXPECTORATED SPUTUM-ASSESSMENT     Status: None   Collection Time    08/31/12  1:10 PM       Result Value Range Status   Specimen Description SPUTUM   Final   Special Requests NONE   Final   Sputum evaluation     Final   Value: THIS SPECIMEN IS ACCEPTABLE. RESPIRATORY CULTURE REPORT TO FOLLOW.   Report Status 08/31/2012 FINAL   Final  CULTURE, RESPIRATORY (NON-EXPECTORATED)     Status: None   Collection Time    08/31/12  1:10 PM      Result Value Range Status   Specimen Description SPUTUM   Final   Special Requests NONE   Final   Gram Stain     Final   Value: RARE WBC PRESENT, PREDOMINANTLY PMN     RARE SQUAMOUS EPITHELIAL CELLS PRESENT     NO ORGANISMS SEEN   Culture NORMAL OROPHARYNGEAL FLORA   Final   Report Status 09/02/2012 FINAL   Final  AFB CULTURE WITH SMEAR     Status: None   Collection Time    09/01/12 11:19 AM      Result Value Range Status   Specimen Description BRONCHIAL WASHINGS   Final   Special Requests NONE   Final   ACID FAST SMEAR NO ACID FAST BACILLI SEEN   Final   Culture     Final   Value: CULTURE WILL BE EXAMINED FOR 6 WEEKS BEFORE ISSUING A FINAL REPORT   Report Status PENDING   Incomplete  FUNGUS CULTURE W SMEAR     Status: None   Collection Time    09/01/12 11:19 AM      Result Value Range Status   Specimen Description BRONCHIAL WASHINGS   Final   Special Requests NONE   Final   Fungal Smear FEW HYPHAL ELEMENTS   Final   Culture CANDIDA ALBICANS   Final   Report Status PENDING   Incomplete  LEGIONELLA CULTURE     Status: None   Collection Time  09/01/12 11:19 AM      Result Value Range Status   Specimen Description BRONCHIAL WASHINGS   Final   Special Requests NONE   Final   Culture NO LEGIONELLA ISOLATED   Final   Report Status 09/06/2012 FINAL   Final  CULTURE, RESPIRATORY (NON-EXPECTORATED)     Status: None   Collection Time    09/01/12 11:19 AM      Result Value Range Status   Specimen Description BRONCHIAL WASHINGS   Final   Special Requests NONE   Final   Gram Stain     Final   Value: RARE WBC PRESENT, PREDOMINANTLY PMN      RARE SQUAMOUS EPITHELIAL CELLS PRESENT     RARE YEAST     RARE GRAM NEGATIVE RODS   Culture     Final   Value: FEW ESCHERICHIA COLI     Note: Confirmed Extended Spectrum Beta-Lactamase Producer (ESBL) CRITICAL RESULT CALLED TO, READ BACK BY AND VERIFIED WITH: JENNIFER Z@7 :45AM ON 09/04/12 BY DANTS     FEW CANDIDA ALBICANS   Report Status 09/04/2012 FINAL   Final   Organism ID, Bacteria ESCHERICHIA COLI   Final    RADIOLOGY STUDIES/RESULTS: Dg Chest 2 View  09/01/2012   *RADIOLOGY REPORT*  Clinical Data: Shortness of breath.  CHEST - 2 VIEW  Comparison: 08/31/2012.  Findings: The left subclavian Port-A-Cath tip it is most likely in the mid SVC at the level of the carina.  Stable pacer wires.  The heart is enlarged but unchanged.  Persistent bilateral infiltrates and effusions.  IMPRESSION:  1.  The Port-A-Cath tip appears to be in the mid SVC. 2.  Persistent bilateral infiltrates and small effusions.   Original Report Authenticated By: Rudie Meyer, [name]   Ct Chest Wo Contrast  08/30/2012   *RADIOLOGY REPORT*  Clinical Data: Hemoptysis.  Pneumonia versus edema.  CT CHEST WITHOUT CONTRAST  Technique:  Multidetector CT imaging of the chest was performed following the standard protocol without IV contrast.  Comparison: Chest radiograph 08/30/2012 and chest CT 02/05/2012  Findings: Heart is enlarged.  Cardiomegaly appears stable. Stable prominent fat density, consistent with lipoma, between the left and right atria.  Dual lead pacer is present.  There is heavy coronary artery atherosclerotic calcification of all three coronary arteries.  Aortic valvular calcifications are present.  A left IJ central venous catheter terminates in the superior vena cava. Thyroid gland and thoracic inlet appear within normal limits.  Heavy atherosclerotic calcification of the thoracic aorta, without aneurysm.  Several mediastinal lymph nodes are visualized, and within normal limits for size.  No pathologically enlarged  lymph nodes are seen in the thorax.  Evaluation for the hilar lymph nodes is somewhat limited without intravenous contrast.  Small bilateral pleural effusions are present.  Multilevel degenerative changes of the thoracic spine.  Multiple benign hemangiomas in the thoracic spine vertebral bodies.  No acute or suspicious osseous abnormality.  No acute findings are identified in the abdomen.  Surgical clips are seen in the left upper quadrant.  There are postsurgical changes of the right lower lobectomy.  There is a background of centrilobular emphysema.  Stable scarring in the right upper lobe anteriorly.  No definite intralobular septal thickening.  There is markedly asymmetric airspace disease in the inferior and posterior aspect of the right upper lobe, where there are areas of consolidation.  There are ground-glass opacities which are new compared to prior chest CT in the left upper lobe, left lower lobe, and right  middle lobe.  5 mm pulmonary nodule in the left upper lobe peripherally on image #27 of the lung windows is new.  IMPRESSION:  1.  Markedly asymmetric bilateral airspace disease.  Airspace disease is most prominent, with areas of consolidation, in the posterior and inferior right upper lobe ( the patient has a history of right lower lobectomy).  Findings in the right upper lobe are favored to be due to pneumonia or aspiration. 2.  New ground-glass opacities in the left upper lobe and left lower lobe may be due to early infection.  Underlying mild pulmonary edema cannot be excluded in this patient with cardiomegaly and small bilateral pleural effusions. 3.  New 5 mm pulmonary nodule left upper lobe. If the patient is at high risk for bronchogenic carcinoma, follow-up chest CT at 6-12 months is recommended.  If the patient is at low risk for bronchogenic carcinoma, follow-up chest CT at 12 months is recommended.  This recommendation follows the consensus statement: Guidelines for Management of Small  Pulmonary Nodules Detected on CT Scans: A Statement from the Fleischner Society as published in Radiology 2005; 237:395-400. 4.  Cardiomegaly with three-vessel coronary artery disease and dual lead cardiac pacer. 5.  Small bilateral pleural effusions 6.  Aortic valve calcifications. This finding raises the possibility of aortic valvular stenosis.  .   Original Report Authenticated By: Britta Mccreedy, [name]   Dg Chest Port 1 View  08/31/2012   *RADIOLOGY REPORT*  Clinical Data: Follow-up edema  PORTABLE CHEST - 1 VIEW  Comparison: Yesterday  Findings: The tip of the left subclavian Port-A-Cath has changed its orientation.  Based on a single view, it is likely in the azygos vein.  Right subclavian dual lead pacemaker device and leads are stable and intact.  Bilateral pleural effusions left greater than right are stable.  Diffuse edema is stable. Cardiomegaly.  IMPRESSION: Left subclavian Port-A-Cath tip has changed its position and is likely in the azygos vein.  This can be confirmed with a lateral view.  Stable airspace disease and pleural effusions.   Original Report Authenticated By: Jolaine Click, [name]   Dg Chest Port 1 View  08/30/2012   *RADIOLOGY REPORT*  Clinical Data: Respiratory failure.  PORTABLE CHEST - 1 VIEW  Comparison: 08/29/2012 and 08/27/2012  Findings: Prominent interstitial markings could represent underlying edema.  There are persistent interstitial and airspace densities in the right mid and lower lung region.  Heart size remains enlarged.  Again noted is a right dual lead cardiac pacemaker.  Left subclavian Port-A-Cath in the SVC region.  Aortic arch is heavily calcified.  IMPRESSION:  Prominent interstitial markings may represent underlying edema. Again noted are increased densities in the right mid and lower lung region which could represent asymmetric edema but cannot exclude an infectious etiology.   Original Report Authenticated By: Richarda Overlie, [name]   Dg Chest Port 1 View  08/29/2012    *RADIOLOGY REPORT*  Clinical Data: Follow-up CHF  PORTABLE CHEST - 1 VIEW  Comparison: Prior chest x-ray 08/27/2012  Findings: Stable position of left subclavian central venous catheter with the tip in the mid superior vena cava.  Right subclavian approach cardiac rhythm maintenance device with leads in the right atrium and right ventricle.  Slightly improved inspiratory volumes with clearing in the right base.  Stable elevation of the left hemidiaphragm resulting in blunting of the left costophrenic angle.  Decreasing interstitial edema with the residual asymmetric patchy opacity in the right mid and lower lobe. Small residual right pleural  effusion.  Stable cardiomegaly. Aortic atherosclerosis again noted.  No pneumothorax.  IMPRESSION:  1.  Decreasing pulmonary edema with improved inspiratory volumes and clearing in the right base. 2.  Residual asymmetric patchy opacity in the right mid and lower lung may reflect residual asymmetric alveolar edema, or pneumonia. 3.  Persistent small right pleural effusion, chronic elevation of the left hemidiaphragm and cardiomegaly.   Original Report Authenticated By: Malachy Moan, [name]   Dg Chest Portable 1 View  08/27/2012   *RADIOLOGY REPORT*  Clinical Data: Shortness of breath.  History of lung cancer post lobectomy.  PORTABLE CHEST - 1 VIEW  Comparison: 02/02/2012  Findings: Shallow inspiration.  Cardiac enlargement with pulmonary vascular congestion and perihilar edema.  Bilateral pleural effusions.  Atelectasis in the lung bases.  No pneumothorax. Changes have developed since the previous study.  Postoperative changes in the right chest.  Stable appearance of cardiac pacemaker and left central venous catheter.  Calcified and tortuous aorta.  IMPRESSION: Interval development of cardiac enlargement, pulmonary vascular congestion, perihilar edema, and bilateral pleural effusions.   Original Report Authenticated By: Burman Nieves, [name]    [facility] Pager:[phone]  If 7PM-7AM, please contact night-coverage [email or web address] Password [email or web address] 09/07/2012, 10:28 AM   LOS: 11 days   Attending Patient seen and examined,agree with the assessment and plan as outlined above.No hemoptysis today-but did have a few small episodes yesterday.Continue with current care. ?Allergic reaction of Primaxin, will ask ID to see if there are any alternate Abx in this situation.  S [place]

## 2012-09-08 ENCOUNTER — Inpatient Hospital Stay (HOSPITAL_COMMUNITY): Payer: Medicare Other

## 2012-09-08 DIAGNOSIS — B372 Candidiasis of skin and nail: Secondary | ICD-10-CM

## 2012-09-08 DIAGNOSIS — R21 Rash and other nonspecific skin eruption: Secondary | ICD-10-CM

## 2012-09-08 DIAGNOSIS — M109 Gout, unspecified: Secondary | ICD-10-CM

## 2012-09-08 DIAGNOSIS — J155 Pneumonia due to Escherichia coli: Secondary | ICD-10-CM

## 2012-09-08 DIAGNOSIS — B029 Zoster without complications: Secondary | ICD-10-CM

## 2012-09-08 LAB — BASIC METABOLIC PANEL
BUN: 62 mg/dL — ABNORMAL HIGH (ref 6–23)
CO2: 29 mEq/L (ref 19–32)
Calcium: 8.1 mg/dL — ABNORMAL LOW (ref 8.4–10.5)
Calcium: 7.9 mg/dL — ABNORMAL LOW (ref 8.4–10.5)
Chloride: 98 mEq/L (ref 96–112)
Creatinine, Ser: 2.13 mg/dL — ABNORMAL HIGH (ref 0.50–1.35)
Creatinine, Ser: 2.36 mg/dL — ABNORMAL HIGH (ref 0.50–1.35)
GFR calc Af Amer: 33 mL/min — ABNORMAL LOW (ref >90)
GFR calc Af Amer: 29 mL/min — ABNORMAL LOW (ref >90)
GFR calc non Af Amer: 28 mL/min — ABNORMAL LOW (ref >90)
Sodium: 139 mEq/L (ref 135–145)

## 2012-09-08 LAB — CBC
MCV: 92.9 fL (ref 78.0–100.0)
Platelets: 360 10*3/uL (ref 150–400)
RBC: 3.79 MIL/uL — ABNORMAL LOW (ref 4.22–5.81)
RDW: 15.5 % (ref 11.5–15.5)
WBC: 19.6 10*3/uL — ABNORMAL HIGH (ref 4.0–10.5)

## 2012-09-08 MED ORDER — DOCUSATE SODIUM 100 MG PO CAPS
200.0000 mg | ORAL_CAPSULE | Freq: Two times a day (BID) | ORAL | Status: DC
  Administered 2012-09-08 – 2012-09-16 (×16): 200 mg via ORAL
  Filled 2012-09-08 (×17): qty 2

## 2012-09-08 MED ORDER — GERHARDT'S BUTT CREAM
TOPICAL_CREAM | Freq: Two times a day (BID) | CUTANEOUS | Status: DC
  Administered 2012-09-08 – 2012-09-10 (×5): via TOPICAL
  Filled 2012-09-08: qty 1

## 2012-09-08 MED ORDER — GABAPENTIN 600 MG PO TABS
300.0000 mg | ORAL_TABLET | Freq: Two times a day (BID) | ORAL | Status: DC
  Filled 2012-09-08 (×2): qty 0.5

## 2012-09-08 MED ORDER — MINERAL OIL RE ENEM
1.0000 | ENEMA | Freq: Once | RECTAL | Status: AC
  Administered 2012-09-08: 1 via RECTAL
  Filled 2012-09-08 (×2): qty 1

## 2012-09-08 MED ORDER — FLUCONAZOLE 100 MG PO TABS
100.0000 mg | ORAL_TABLET | Freq: Every day | ORAL | Status: DC
  Administered 2012-09-08 – 2012-09-10 (×3): 100 mg via ORAL
  Filled 2012-09-08 (×4): qty 1

## 2012-09-08 MED ORDER — MAGNESIUM HYDROXIDE 400 MG/5ML PO SUSP
30.0000 mL | Freq: Once | ORAL | Status: AC
  Administered 2012-09-08: 30 mL via ORAL

## 2012-09-08 MED ORDER — GABAPENTIN 300 MG PO CAPS
300.0000 mg | ORAL_CAPSULE | Freq: Two times a day (BID) | ORAL | Status: DC
  Administered 2012-09-08 – 2012-09-09 (×3): 300 mg via ORAL
  Filled 2012-09-08 (×4): qty 1

## 2012-09-08 MED ORDER — WHITE PETROLATUM GEL
Status: AC
  Administered 2012-09-08: 08:00:00
  Filled 2012-09-08: qty 5

## 2012-09-08 MED ORDER — SENNA 8.6 MG PO TABS
1.0000 | ORAL_TABLET | Freq: Once | ORAL | Status: AC
  Administered 2012-09-08: 8.6 mg via ORAL
  Filled 2012-09-08: qty 1

## 2012-09-08 NOTE — Progress Notes (Signed)
Patient Name: [name] BULTHUIS Sr.      SUBJECTIVE: sob some better, still with some hemoptysis and edema    Past Medical History  Diagnosis Date  . Stricture and stenosis of esophagus 12/07/2004    EGD done on 12/07/2004  . GERD (gastroesophageal reflux disease)   . Hypoxemia     With chronic respiratory failure  . Increased prostate specific antigen (PSA) velocity 02/26/2011  . DM (diabetes mellitus) 02/26/2011  . Gout 02/26/2011  . COPD (chronic obstructive pulmonary disease)     oxygen at home, 4L  . OSA (obstructive sleep apnea) 02/26/2011    CPAP  . Paroxysmal atrial fibrillation   . Depression   . Cardiac arrest 2009    while in hospital   . Hypertension   . HTN (hypertension) 02/26/2011  . Cardiac arrest 2009    while in hospital  . Lung cancer     lung s/p lobectomy x2 on RT lung  . Basal cell carcinoma of nose 02/26/2011  . ETOH abuse 01/29/2012    started drinking again - hx cardiac arrest in 2009 due to dt's with organ failure  . CKD (chronic kidney disease) 02/28/2012  . Chronic diastolic CHF (congestive heart failure)     a. EF 55-60% by echo 2013.  . Sick sinus syndrome     a. s/p [employer] pacemaker 2010.  . Bradycardia     a. Admitted 2010 with severe bradycardia, runs of paroxysmal VT.   Marland Kitchen Paroxysmal VT     a. In 2010 in setting of marked bradycardia.  Marland Kitchen CAD (coronary artery disease)     a. Nonobstructive CAD by cath 2006.  Marland Kitchen Esophageal ulcer     a. By EGD 10/2011.  Marland Kitchen Hiatal hernia     a. By EGD 10/2011.  . Valvular heart disease     a. Mild MR/mild AI/mild AS by echo 05/2011.    Scheduled Meds:  Scheduled Meds: . amiodarone  400 mg Oral BID  . arformoterol  15 mcg Nebulization BID  . atorvastatin  40 mg Oral QHS  . barrier cream  1 application Topical BID  . budesonide (PULMICORT) nebulizer solution  0.5 mg Nebulization BID  . citalopram  10 mg Oral Daily  . diltiazem  180 mg Oral BID  . diphenhydrAMINE  25 mg Oral Q6H  . ezetimibe   10 mg Oral Daily  . furosemide  120 mg Intravenous BID  . guaiFENesin  1,200 mg Oral BID  . hydrALAZINE  50 mg Oral BID  . imipenem-cilastatin  250 mg Intravenous Q6H  . insulin aspart  0-9 Units Subcutaneous TID WC  . insulin glargine  10 Units Subcutaneous QHS  . magnesium oxide  400 mg Oral Daily  . metoprolol succinate  25 mg Oral BID WC  . multivitamin  1 tablet Oral Daily  . pantoprazole  40 mg Oral Daily  . predniSONE  20 mg Oral Daily  . senna-docusate  1 tablet Oral QHS  . sodium chloride  3 mL Intravenous Q12H  . tiotropium  18 mcg Inhalation Daily  . valACYclovir  1,000 mg Oral TID  . [COMPLETED] white petrolatum       Continuous Infusions:   PHYSICAL EXAM Filed Vitals:   09/07/12 1624 09/07/12 2020 09/07/12 2117 09/08/12 0711  BP:   143/77 138/91  Pulse:  74 76 87  Temp:   98.3 F (36.8 C) 98 F (36.7 C)  TempSrc:   Oral Oral  Resp:  18 20 18   Height:      Weight:    208 lb 15.9 oz (94.8 kg)  SpO2: 92% 95% 94% 94%    General appearance: alert and cooperative on CPAP Lungs: lung coarse anterior and laterally Heart: irregularly irregular rhythm Abdomen: soft, non-tender; bowel sounds normal; no masses,  no organomegaly Extremities: edema 2+ Skin: Skin color, texture, turgor normal. No rashes or lesions Neurologic: Grossly normal Alert and oriented  TELEMETRY: Reviewed telemetry pt in  afib:    Intake/Output Summary (Last 24 hours) at 09/08/12 0757 Last data filed at 09/08/12 0718  Gross per 24 hour  Intake 11590.37 ml  Output   3800 ml  Net 7790.37 ml   ?????????????????????????????????????      LABS: Basic Metabolic Panel:  Recent Labs Lab 09/02/12 1245 09/03/12 0527 09/04/12 0515 09/05/12 0500 09/06/12 0545 09/07/12 0640 09/08/12 0540  NA 134* 132* 137 139 137 139 139  K 3.7 3.2* 3.2* 3.5 4.0 3.3* 3.7  CL 92* 92* 100 99 99 102 100  CO2 29 28 25 28 30 28 29   GLUCOSE 249* 187* 237* 146* 131* 184* 127*  BUN 80* 81* 72* 67* 64* 64*  61*  CREATININE 2.54* 2.39* 2.09* 2.14* 2.15* 2.23* 2.13*  CALCIUM 8.5 8.3* 7.7* 8.2* 8.5 8.0* 8.1*   Cardiac Enzymes: No results found for this basename: CKTOTAL, CKMB, CKMBINDEX, TROPONINI,  in the last 72 hours CBC:  Recent Labs Lab 09/02/12 1245 09/03/12 0527 09/04/12 0515 09/05/12 0500 09/07/12 0640 09/08/12 0540  WBC 18.5* 19.3* 19.0* 19.0* 21.4* 19.6*  NEUTROABS 16.5*  --   --  16.4*  --   --   HGB 12.1* 11.8* 10.9* 12.4* 11.0* 11.7*  HCT 35.1* 35.2* 32.6* 36.1* 33.2* 35.2*  MCV 91.2 91.7 91.1 91.9 93.0 92.9  PLT 369 387 306 386 337 360   PROTIME: No results found for this basename: LABPROT, INR,  in the last 72 hours Liver Function Tests: No results found for this basename: AST, ALT, ALKPHOS, BILITOT, PROT, ALBUMIN,  in the last 72 hours No results found for this basename: LIPASE, AMYLASE,  in the last 72 hours BNP: BNP (last 3 results)  Recent Labs  08/28/12 0500 08/31/12 0500 09/06/12 0545  PROBNP 6086.0* 7566.0* 1793.0*     ASSESSMENT AND PLAN:  Principal Problem:   Acute-on-chronic respiratory failure Active Problems:   CARCINOMA, LUNG, SQUAMOUS CELL   CHRONIC OBSTRUCTIVE PULMONARY DISEASE, SEVERE   HTN (hypertension)   DM (diabetes mellitus) type II controlled with renal manifestation   Atrial fibrillation   CKD (chronic kidney disease), stage III   Cellulitis of left ankle   Acute on chronic diastolic congestive heart failure   Leukocytosis   Elevation of cardiac enzymes   Hemoptysis   Pneumonia due to Gram-negative bacteria Hypokalemia -resolved   Continue to diurese   appreicaite ID and pulm input I&Os still USELESS Fluid restriction Signed, Sherryl Manges MD  09/08/2012

## 2012-09-08 NOTE — Progress Notes (Signed)
Patient had two small Maroon dime size blood clots with his cough

## 2012-09-08 NOTE — Progress Notes (Signed)
PATIENT DETAILS Name: [name] GERDING Sr. Age: 79 y.o. Sex: male Date of Birth: [birth year] Admit Date: 08/27/2012 Admitting Physician Clydia Llano, MD VOZ:[id number] [name], MD  Subjective: Last hemoptysis 6/24 am. Numerous small episodes pf hemoptysis-upto 3-yesterday  Patient mentions significant constipation, no BM x 1 week as well as pain on his left hip - appears to have shingles.  Assessment/Plan: Acute-on-chronic respiratory failure with hypoxia due to:  Acute on chronic diastolic heart failure CHF  Acute on CHRONIC OBSTRUCTIVE PULMONARY DISEASE, SEVERE - Continued slow improvement. - Continue with Lasix infusion - strict Is and Os (have discussed tracking these correctly with the unit director) - Cont supportive care with nebs/MDI  - prednisone decreased to 20 mg on 6/22.  slow taper - Do not lie flat, keep head of bed raised.  PNA -BAL positive for ESBL E Coli -completed 9 days of levaquin-stop Levaquin, now on Primaxin-Day 5 -Appreciate ID consult.  Dr. Drue Second recommends 8 days of antibiotic treatment.(antibiotics end 6/28)  Shingles on left buttock and scrotum -Valtrex started 6/23 -start low dose gabapentin 6/24 bid -Supportive skin care  Yeast in groin area -diflucan orally -Topical (Gerhardt's butt) cream to affected area  Acute diastolic heart failure - Cardiology following and managing diuresis - Continue with Lasix infusion - Requesting strict Is&Os and Daily weights - Creatinine stable at  2.15  Atrial fibrillation - Unfortunately off anti-coagulation given hemoptysis, per Dr. [name] ok to start Epixiban in 2-3 days.However need to make sure the hemoptysis resolves first - On metoprolol and Cardizem for rate control -Tolerating Amiodarone well  Hemoptysis -Etiology? Recurrence of underlying malignancy vs PNA - Overall much better-but does recur intermittently.  3 small episodes 6/23, 1 small episode 6/24 -  Status post a bronchoscopy with no endobronchial  lesions seen, only old clot seen on bronchoscopy - BAL +ve for ESBL E Coli-now on Primaxin  CKD (chronic kidney disease), stage III  - Creatinine stable at approx 2.15. Monitor renal function closely.  Leukocytosis - WBC elevated but stable - Multifactorial from possible pneumonia, on steroids - Monitor  Hypertension - Controlled- continue with current medication  Diabetes - Lantus at 10 units, continue with SSI-s  CORONARY ARTERY DISEASE (nonobstructive disease 2006 catheterization)/Elevation of cardiac enzymes  -no CP and likely due to acute HF, demand ischemia from recent hypoxia and worsened renal function - Cardiology following  Cellulitis of left ankle  -has decreased rednessboth legs c/w stasis dermatitis- resolved   NSCLC s/p RULectomy and adjuvant chemo in 2006 - Does have a new lung nodule-will need close followup as an outpatient  Disposition: Remain inpatient.  Consider LTAC.  Consult has been placed with Case Management.  DVT Prophylaxis:  SCD's  Code Status: Full code  Family Communication Spouse at bedside  Procedures:  None  CONSULTS:  cardiology and pulmonary/intensive care Infectious disease.   MEDICATIONS: Scheduled Meds: . amiodarone  400 mg Oral BID  . arformoterol  15 mcg Nebulization BID  . atorvastatin  40 mg Oral QHS  . barrier cream  1 application Topical BID  . budesonide (PULMICORT) nebulizer solution  0.5 mg Nebulization BID  . citalopram  10 mg Oral Daily  . diltiazem  180 mg Oral BID  . diphenhydrAMINE  25 mg Oral Q6H  . ezetimibe  10 mg Oral Daily  . furosemide  120 mg Intravenous BID  . guaiFENesin  1,200 mg Oral BID  . hydrALAZINE  50 mg Oral BID  . imipenem-cilastatin  250 mg Intravenous Q6H  .  insulin aspart  0-9 Units Subcutaneous TID WC  . insulin glargine  10 Units Subcutaneous QHS  . magnesium hydroxide  30 mL Oral Once  . magnesium oxide  400 mg Oral Daily  . metoprolol succinate  25 mg Oral BID WC  . mineral  oil  1 enema Rectal Once  . multivitamin  1 tablet Oral Daily  . pantoprazole  40 mg Oral Daily  . predniSONE  20 mg Oral Daily  . senna-docusate  1 tablet Oral QHS  . sodium chloride  3 mL Intravenous Q12H  . tiotropium  18 mcg Inhalation Daily  . valACYclovir  1,000 mg Oral TID   Continuous Infusions:  PRN Meds:.ALPRAZolam, Glycerin (Adult), levalbuterol, morphine injection, nitroGLYCERIN, ondansetron (ZOFRAN) IV, ondansetron, oxyCODONE, polyvinyl alcohol, sodium chloride, traZODone  Antibiotics: Anti-infectives   Start     Dose/Rate Route Frequency Ordered Stop   09/07/12 1600  valACYclovir (VALTREX) tablet 1,000 mg     1,000 mg Oral 3 times daily 09/07/12 1303 09/14/12 1559   09/04/12 1200  imipenem-cilastatin (PRIMAXIN) 250 mg in sodium chloride 0.9 % 100 mL IVPB     250 mg 200 mL/hr over 30 Minutes Intravenous 4 times per day 09/04/12 0856     08/29/12 1200  levofloxacin (LEVAQUIN) tablet 500 mg  Status:  Discontinued     500 mg Oral Every 48 hours 08/29/12 0957 09/04/12 1008   08/27/12 0900  vancomycin (VANCOCIN) 1,250 mg in sodium chloride 0.9 % 250 mL IVPB  Status:  Discontinued     1,250 mg 166.7 mL/hr over 90 Minutes Intravenous Every 24 hours 08/27/12 0826 08/28/12 1809   08/27/12 0900  piperacillin-tazobactam (ZOSYN) IVPB 3.375 g  Status:  Discontinued     3.375 g 12.5 mL/hr over 240 Minutes Intravenous 3 times per day 08/27/12 0826 08/28/12 1809   08/27/12 0600  levofloxacin (LEVAQUIN) IVPB 500 mg     500 mg 100 mL/hr over 60 Minutes Intravenous  Once 08/27/12 0554 08/27/12 0735       PHYSICAL EXAM: Vital signs in last 24 hours: Filed Vitals:   09/07/12 1624 09/07/12 2020 09/07/12 2117 09/08/12 0711  BP:   143/77 138/91  Pulse:  74 76 87  Temp:   98.3 F (36.8 C) 98 F (36.7 C)  TempSrc:   Oral Oral  Resp:  18 20 18   Height:      Weight:    94.8 kg (208 lb 15.9 oz)  SpO2: 92% 95% 94% 94%    Weight change:  Filed Weights   09/06/12 1110 09/07/12 0700  09/08/12 0711  Weight: 92.1 kg (203 lb 0.7 oz) 94.6 kg (208 lb 8.9 oz) 94.8 kg (208 lb 15.9 oz)   Body mass index is 32.73 kg/(m^2).   Gen Exam: Awake and alert, very pleasant, wife at bedside Neck: Supple, No JVD.   Chest: minimal crackles, good air movement. CVS: S1 S2 Regular, no murmurs.  Abdomen: soft, BS +, non tender, non distended.  Extremities: 3+ edema on dorsum of feet bilaterally, lower extremities warm to touch.  Neurologic: Non Focal.   Skin: multiple areas of rash.  Erythematous scale on left buttock that extends to scrotum (looks like shingles).   Plantar portion of feet are bright red (confluent erythema), and groin is red as well w/o scale - appears to be yeast.   Intake/Output from previous day:  Intake/Output Summary (Last 24 hours) at 09/08/12 0951 Last data filed at 09/08/12 0845  Gross per 24 hour  Intake  12010.37 ml  Output   3050 ml  Net 8960.37 ml     LAB RESULTS: CBC  Recent Labs Lab 09/02/12 1245 09/03/12 0527 09/04/12 0515 09/05/12 0500 09/07/12 0640 09/08/12 0540  WBC 18.5* 19.3* 19.0* 19.0* 21.4* 19.6*  HGB 12.1* 11.8* 10.9* 12.4* 11.0* 11.7*  HCT 35.1* 35.2* 32.6* 36.1* 33.2* 35.2*  PLT 369 387 306 386 337 360  MCV 91.2 91.7 91.1 91.9 93.0 92.9  MCH 31.4 30.7 30.4 31.6 30.8 30.9  MCHC 34.5 33.5 33.4 34.3 33.1 33.2  RDW 15.5 15.4 15.3 15.2 15.4 15.5  LYMPHSABS 0.8  --   --  1.4  --   --   MONOABS 1.2*  --   --  1.3*  --   --   EOSABS 0.0  --   --  0.0  --   --   BASOSABS 0.0  --   --  0.0  --   --     Chemistries   Recent Labs Lab 09/04/12 0515 09/05/12 0500 09/06/12 0545 09/07/12 0640 09/08/12 0540  NA 137 139 137 139 139  K 3.2* 3.5 4.0 3.3* 3.7  CL 100 99 99 102 100  CO2 25 28 30 28 29   GLUCOSE 237* 146* 131* 184* 127*  BUN 72* 67* 64* 64* 61*  CREATININE 2.09* 2.14* 2.15* 2.23* 2.13*  CALCIUM 7.7* 8.2* 8.5 8.0* 8.1*    CBG:  Recent Labs Lab 09/07/12 0741 09/07/12 1233 09/07/12 1720 09/07/12 2114  09/08/12 0734  GLUCAP 161* 123* 202* 231* 128*    Urine Studies MICROBIOLOGY: Recent Results (from the past 240 hour(s))  CULTURE, EXPECTORATED SPUTUM-ASSESSMENT     Status: None   Collection Time    08/31/12  8:38 AM      Result Value Range Status   Specimen Description SPUTUM   Final   Special Requests NONE   Final   Sputum evaluation     Final   Value: MICROSCOPIC FINDINGS SUGGEST THAT THIS SPECIMEN IS NOT REPRESENTATIVE OF LOWER RESPIRATORY SECRETIONS. PLEASE RECOLLECT.     CALLED TO Mackie Pai 08/31/12 0913 BY K SCHULTZ   Report Status 08/31/2012 FINAL   Final  CULTURE, EXPECTORATED SPUTUM-ASSESSMENT     Status: None   Collection Time    08/31/12  1:10 PM      Result Value Range Status   Specimen Description SPUTUM   Final   Special Requests NONE   Final   Sputum evaluation     Final   Value: THIS SPECIMEN IS ACCEPTABLE. RESPIRATORY CULTURE REPORT TO FOLLOW.   Report Status 08/31/2012 FINAL   Final  CULTURE, RESPIRATORY (NON-EXPECTORATED)     Status: None   Collection Time    08/31/12  1:10 PM      Result Value Range Status   Specimen Description SPUTUM   Final   Special Requests NONE   Final   Gram Stain     Final   Value: RARE WBC PRESENT, PREDOMINANTLY PMN     RARE SQUAMOUS EPITHELIAL CELLS PRESENT     NO ORGANISMS SEEN   Culture NORMAL OROPHARYNGEAL FLORA   Final   Report Status 09/02/2012 FINAL   Final  AFB CULTURE WITH SMEAR     Status: None   Collection Time    09/01/12 11:19 AM      Result Value Range Status   Specimen Description BRONCHIAL WASHINGS   Final   Special Requests NONE   Final   ACID FAST SMEAR NO ACID FAST  BACILLI SEEN   Final   Culture     Final   Value: CULTURE WILL BE EXAMINED FOR 6 WEEKS BEFORE ISSUING A FINAL REPORT   Report Status PENDING   Incomplete  FUNGUS CULTURE W SMEAR     Status: None   Collection Time    09/01/12 11:19 AM      Result Value Range Status   Specimen Description BRONCHIAL WASHINGS   Final   Special Requests  NONE   Final   Fungal Smear FEW HYPHAL ELEMENTS   Final   Culture CANDIDA ALBICANS   Final   Report Status PENDING   Incomplete  LEGIONELLA CULTURE     Status: None   Collection Time    09/01/12 11:19 AM      Result Value Range Status   Specimen Description BRONCHIAL WASHINGS   Final   Special Requests NONE   Final   Culture NO LEGIONELLA ISOLATED   Final   Report Status 09/06/2012 FINAL   Final  CULTURE, RESPIRATORY (NON-EXPECTORATED)     Status: None   Collection Time    09/01/12 11:19 AM      Result Value Range Status   Specimen Description BRONCHIAL WASHINGS   Final   Special Requests NONE   Final   Gram Stain     Final   Value: RARE WBC PRESENT, PREDOMINANTLY PMN     RARE SQUAMOUS EPITHELIAL CELLS PRESENT     RARE YEAST     RARE GRAM NEGATIVE RODS   Culture     Final   Value: FEW ESCHERICHIA COLI     Note: Confirmed Extended Spectrum Beta-Lactamase Producer (ESBL) CRITICAL RESULT CALLED TO, READ BACK BY AND VERIFIED WITH: JENNIFER Z@7 :45AM ON 09/04/12 BY DANTS     FEW CANDIDA ALBICANS   Report Status 09/04/2012 FINAL   Final   Organism ID, Bacteria ESCHERICHIA COLI   Final    RADIOLOGY STUDIES/RESULTS: Dg Chest 2 View  09/01/2012   *RADIOLOGY REPORT*  Clinical Data: Shortness of breath.  CHEST - 2 VIEW  Comparison: 08/31/2012.  Findings: The left subclavian Port-A-Cath tip it is most likely in the mid SVC at the level of the carina.  Stable pacer wires.  The heart is enlarged but unchanged.  Persistent bilateral infiltrates and effusions.  IMPRESSION:  1.  The Port-A-Cath tip appears to be in the mid SVC. 2.  Persistent bilateral infiltrates and small effusions.   Original Report Authenticated By: Rudie Meyer, [name]   Ct Chest Wo Contrast  08/30/2012   *RADIOLOGY REPORT*  Clinical Data: Hemoptysis.  Pneumonia versus edema.  CT CHEST WITHOUT CONTRAST  Technique:  Multidetector CT imaging of the chest was performed following the standard protocol without IV contrast.   Comparison: Chest radiograph 08/30/2012 and chest CT 02/05/2012  Findings: Heart is enlarged.  Cardiomegaly appears stable. Stable prominent fat density, consistent with lipoma, between the left and right atria.  Dual lead pacer is present.  There is heavy coronary artery atherosclerotic calcification of all three coronary arteries.  Aortic valvular calcifications are present.  A left IJ central venous catheter terminates in the superior vena cava. Thyroid gland and thoracic inlet appear within normal limits.  Heavy atherosclerotic calcification of the thoracic aorta, without aneurysm.  Several mediastinal lymph nodes are visualized, and within normal limits for size.  No pathologically enlarged lymph nodes are seen in the thorax.  Evaluation for the hilar lymph nodes is somewhat limited without intravenous contrast.  Small bilateral pleural effusions are  present.  Multilevel degenerative changes of the thoracic spine.  Multiple benign hemangiomas in the thoracic spine vertebral bodies.  No acute or suspicious osseous abnormality.  No acute findings are identified in the abdomen.  Surgical clips are seen in the left upper quadrant.  There are postsurgical changes of the right lower lobectomy.  There is a background of centrilobular emphysema.  Stable scarring in the right upper lobe anteriorly.  No definite intralobular septal thickening.  There is markedly asymmetric airspace disease in the inferior and posterior aspect of the right upper lobe, where there are areas of consolidation.  There are ground-glass opacities which are new compared to prior chest CT in the left upper lobe, left lower lobe, and right middle lobe.  5 mm pulmonary nodule in the left upper lobe peripherally on image #27 of the lung windows is new.  IMPRESSION:  1.  Markedly asymmetric bilateral airspace disease.  Airspace disease is most prominent, with areas of consolidation, in the posterior and inferior right upper lobe ( the patient has a  history of right lower lobectomy).  Findings in the right upper lobe are favored to be due to pneumonia or aspiration. 2.  New ground-glass opacities in the left upper lobe and left lower lobe may be due to early infection.  Underlying mild pulmonary edema cannot be excluded in this patient with cardiomegaly and small bilateral pleural effusions. 3.  New 5 mm pulmonary nodule left upper lobe. If the patient is at high risk for bronchogenic carcinoma, follow-up chest CT at 6-12 months is recommended.  If the patient is at low risk for bronchogenic carcinoma, follow-up chest CT at 12 months is recommended.  This recommendation follows the consensus statement: Guidelines for Management of Small Pulmonary Nodules Detected on CT Scans: A Statement from the Fleischner Society as published in Radiology 2005; 237:395-400. 4.  Cardiomegaly with three-vessel coronary artery disease and dual lead cardiac pacer. 5.  Small bilateral pleural effusions 6.  Aortic valve calcifications. This finding raises the possibility of aortic valvular stenosis.  .   Original Report Authenticated By: Britta Mccreedy, [name]   Dg Chest Port 1 View  08/31/2012   *RADIOLOGY REPORT*  Clinical Data: Follow-up edema  PORTABLE CHEST - 1 VIEW  Comparison: Yesterday  Findings: The tip of the left subclavian Port-A-Cath has changed its orientation.  Based on a single view, it is likely in the azygos vein.  Right subclavian dual lead pacemaker device and leads are stable and intact.  Bilateral pleural effusions left greater than right are stable.  Diffuse edema is stable. Cardiomegaly.  IMPRESSION: Left subclavian Port-A-Cath tip has changed its position and is likely in the azygos vein.  This can be confirmed with a lateral view.  Stable airspace disease and pleural effusions.   Original Report Authenticated By: Jolaine Click, [name]   Dg Chest Port 1 View  08/30/2012   *RADIOLOGY REPORT*  Clinical Data: Respiratory failure.  PORTABLE CHEST - 1 VIEW   Comparison: 08/29/2012 and 08/27/2012  Findings: Prominent interstitial markings could represent underlying edema.  There are persistent interstitial and airspace densities in the right mid and lower lung region.  Heart size remains enlarged.  Again noted is a right dual lead cardiac pacemaker.  Left subclavian Port-A-Cath in the SVC region.  Aortic arch is heavily calcified.  IMPRESSION:  Prominent interstitial markings may represent underlying edema. Again noted are increased densities in the right mid and lower lung region which could represent asymmetric edema but cannot exclude  an infectious etiology.   Original Report Authenticated By: Richarda Overlie, [name]   Dg Chest Port 1 View  08/29/2012   *RADIOLOGY REPORT*  Clinical Data: Follow-up CHF  PORTABLE CHEST - 1 VIEW  Comparison: Prior chest x-ray 08/27/2012  Findings: Stable position of left subclavian central venous catheter with the tip in the mid superior vena cava.  Right subclavian approach cardiac rhythm maintenance device with leads in the right atrium and right ventricle.  Slightly improved inspiratory volumes with clearing in the right base.  Stable elevation of the left hemidiaphragm resulting in blunting of the left costophrenic angle.  Decreasing interstitial edema with the residual asymmetric patchy opacity in the right mid and lower lobe. Small residual right pleural effusion.  Stable cardiomegaly. Aortic atherosclerosis again noted.  No pneumothorax.  IMPRESSION:  1.  Decreasing pulmonary edema with improved inspiratory volumes and clearing in the right base. 2.  Residual asymmetric patchy opacity in the right mid and lower lung may reflect residual asymmetric alveolar edema, or pneumonia. 3.  Persistent small right pleural effusion, chronic elevation of the left hemidiaphragm and cardiomegaly.   Original Report Authenticated By: Malachy Moan, [name]   Dg Chest Portable 1 View  08/27/2012   *RADIOLOGY REPORT*  Clinical Data: Shortness of  breath.  History of lung cancer post lobectomy.  PORTABLE CHEST - 1 VIEW  Comparison: 02/02/2012  Findings: Shallow inspiration.  Cardiac enlargement with pulmonary vascular congestion and perihilar edema.  Bilateral pleural effusions.  Atelectasis in the lung bases.  No pneumothorax. Changes have developed since the previous study.  Postoperative changes in the right chest.  Stable appearance of cardiac pacemaker and left central venous catheter.  Calcified and tortuous aorta.  IMPRESSION: Interval development of cardiac enlargement, pulmonary vascular congestion, perihilar edema, and bilateral pleural effusions.   Original Report Authenticated By: Burman Nieves, [name]    [facility] Pager:[phone]  If 7PM-7AM, please contact night-coverage [email or web address] Password [facility] 09/08/2012, 9:51 AM   LOS: 12 days   Attending Patient seen and examined, agree with the assessment and plan as outlined above.Unfortunately has developed shingles in his gluteal area. Now on Valtrex. Intermittent hemoptysis continues. Suspect he may need to go to LTACH-family agreeable.  S Janise Gora

## 2012-09-08 NOTE — Progress Notes (Signed)
[facility]    Date of Admission:  08/27/2012   Total days of antibiotics 14        Day 5 imipenem        Day 2 valtrex           ID: [name] Newcomer Sr. is a 79 y.o. male  former smoker CAD s/p pacemaker, NSCLCa s/p RULectomy and adjuvant chemo in 2006, has severe COPD on home O2, OSA on BIPAP, presented with severe dyspnea/hemoptysis from acute pulmonary edema, acute diastolic dysfunction, and new onset A fib with RVR.   Principal Problem:   Acute-on-chronic respiratory failure Active Problems:   CARCINOMA, LUNG, SQUAMOUS CELL   CHRONIC OBSTRUCTIVE PULMONARY DISEASE, SEVERE   HTN (hypertension)   DM (diabetes mellitus) type II controlled with renal manifestation   Atrial fibrillation   CKD (chronic kidney disease), stage III   Cellulitis of left ankle   Acute on chronic diastolic congestive heart failure   Leukocytosis   Elevation of cardiac enzymes   Hemoptysis   Pneumonia due to Gram-negative bacteria    Subjective: Still has mild hemoptysis with a teaspoon of blood tinged sputum. Rash on buttocks feels better. Wife reports beginning to crust. Used vaseline on his plantar aspect of feet which has soothed his rash. afebrile  Medications:  . amiodarone  400 mg Oral BID  . arformoterol  15 mcg Nebulization BID  . atorvastatin  40 mg Oral QHS  . barrier cream  1 application Topical BID  . citalopram  10 mg Oral Daily  . diltiazem  180 mg Oral BID  . diphenhydrAMINE  25 mg Oral Q6H  . ezetimibe  10 mg Oral Daily  . fluconazole  100 mg Oral Daily  . furosemide  120 mg Intravenous BID  . gabapentin  300 mg Oral BID  . Gerhardt's butt cream   Topical BID  . guaiFENesin  1,200 mg Oral BID  . hydrALAZINE  50 mg Oral BID  . imipenem-cilastatin  250 mg Intravenous Q6H  . insulin aspart  0-9 Units Subcutaneous TID WC  . insulin glargine  10 Units Subcutaneous QHS  . magnesium oxide  400 mg Oral Daily  . metoprolol succinate  25 mg Oral BID WC  .  multivitamin  1 tablet Oral Daily  . pantoprazole  40 mg Oral Daily  . predniSONE  20 mg Oral Daily  . senna-docusate  1 tablet Oral QHS  . sodium chloride  3 mL Intravenous Q12H  . tiotropium  18 mcg Inhalation Daily  . valACYclovir  1,000 mg Oral TID    Objective: Vital signs in last 24 hours: Temp:  [97.4 F (36.3 C)-98.3 F (36.8 C)] 97.4 F (36.3 C) (06/24 1300) Pulse Rate:  [73-87] 73 (06/24 1300) Resp:  [18-22] 18 (06/24 1300) BP: (132-152)/(66-91) 132/66 mmHg (06/24 1300) SpO2:  [94 %-96 %] 95 % (06/24 1300) Weight:  [208 lb 15.9 oz (94.8 kg)] 208 lb 15.9 oz (94.8 kg) (06/24 0711) Constitutional: oriented to person, place, and time. He appears well-developed and well-nourished. No distress but has ongoing dry cough  HENT:  Mouth/Throat: Oropharynx is clear and moist. No oropharyngeal exudate.  Cardiovascular: Normal rate, regular rhythm and normal heart sounds. Exam reveals no gallop and no friction rub.  No murmur heard.  Pulmonary/Chest: Effort normal and breath sounds normal. No respiratory distress. He has no wheezes.  Abdominal: Soft. Bowel sounds are normal. He exhibits no distension. There is no tenderness.  Lymphadenopathy: no cervical adenopathy.  Neurological: He is alert and oriented to person, place, and time.  Skin: he has patch of small clusters of papules to left buttocks with scattered lesions. Non are vesicular.  GU = groin and scrotum erythamatous plaque  Ext: erythema to plantar aspect of both feet Psychiatric: He has a normal mood and affect. His behavior is normal.     Lab Results  Recent Labs  09/07/12 0640 09/08/12 0540 09/08/12 1425  WBC 21.4* 19.6*  --   HGB 11.0* 11.7*  --   HCT 33.2* 35.2*  --   NA 139 139 135  K 3.3* 3.7 4.2  CL 102 100 98  CO2 28 29 26   BUN 64* 61* 62*  CREATININE 2.23* 2.13* 2.36*    Microbiology: 6/17 resp culture: ESBL ecoli  6/12 blood cx NGTD  Studies/Results: No results  found.   Assessment/Plan: 79yo M with hx of lung ca, sever copd presents with hemoptysis being treated for ESBL ecoli pneumonia but also has shingle appear rash to buttocks, candidal skin infection of groin and nonspecific macular papular rash to bottom of feet.  Rash to groin c/w candidal infection = continue with antifugal cream/powder BID  Buttock to rash c/w shingles = appears to improve, continue with valtrex 1gm TID x 7 days. No need for airborne isolation. Just contact, not disseminated   Rash to feet = unclear what this may be, will continue to monitor to see if any ointments or change in therapy is needed   esbl ecoli pna = treat for 8 days as HCAP, but may need to extend treatment to 10-14days if thought that he is not resolving from pneumonia.   Drue Second [facility] Cell: [phone] Pager: [phone]  09/08/2012, 4:54 PM

## 2012-09-08 NOTE — Progress Notes (Signed)
Pt was placed on CPAP of 14 with a full face mask. 4 LPM oxygen bleed in. Pt is resting comfortably. Vitals are stable.  RT made pt aware to call anytime if he had any questions or problems.

## 2012-09-08 NOTE — Progress Notes (Signed)
NUTRITION FOLLOW UP  Intervention:   No nutrition interventions- RD will continue to follow   Nutrition Dx:   Inadequate oral intake. Resolved  Goal:   PO intake to meet >/=90% estimated nutrition needs. Met  Monitor:   Po intake, weight trends  Assessment:   Pt continues to eat 100% most meals. Continues to have small amount of hemoptysis, s/p bronch. Question underlying malignancy vs PNA. Now also has shingles.  Height: Ht Readings from Last 1 Encounters:  08/27/12 5\' 7"  (1.702 m)    Weight Status:   Wt Readings from Last 1 Encounters:  09/08/12 208 lb 15.9 oz (94.8 kg)    Re-estimated needs:  Kcal: 2050-2350  Protein: 93-112g  Fluid: ~2.0 L/day   Skin: skin tear L arm, edema noted in U and L extremities   Diet Order: Cardiac   Intake/Output Summary (Last 24 hours) at 09/08/12 1409 Last data filed at 09/08/12 0845  Gross per 24 hour  Intake 11530.37 ml  Output   3050 ml  Net 8480.37 ml    Last BM: 6/22   Labs:   Recent Labs Lab 09/06/12 0545 09/07/12 0640 09/08/12 0540  NA 137 139 139  K 4.0 3.3* 3.7  CL 99 102 100  CO2 30 28 29   BUN 64* 64* 61*  CREATININE 2.15* 2.23* 2.13*  CALCIUM 8.5 8.0* 8.1*  GLUCOSE 131* 184* 127*    CBG (last 3)   Recent Labs  09/07/12 2114 09/08/12 0734 09/08/12 1245  GLUCAP 231* 128* 134*    Scheduled Meds: . amiodarone  400 mg Oral BID  . arformoterol  15 mcg Nebulization BID  . atorvastatin  40 mg Oral QHS  . barrier cream  1 application Topical BID  . budesonide (PULMICORT) nebulizer solution  0.5 mg Nebulization BID  . citalopram  10 mg Oral Daily  . diltiazem  180 mg Oral BID  . diphenhydrAMINE  25 mg Oral Q6H  . ezetimibe  10 mg Oral Daily  . fluconazole  100 mg Oral Daily  . furosemide  120 mg Intravenous BID  . gabapentin  300 mg Oral BID  . Gerhardt's butt cream   Topical BID  . guaiFENesin  1,200 mg Oral BID  . hydrALAZINE  50 mg Oral BID  . imipenem-cilastatin  250 mg Intravenous Q6H   . insulin aspart  0-9 Units Subcutaneous TID WC  . insulin glargine  10 Units Subcutaneous QHS  . magnesium oxide  400 mg Oral Daily  . metoprolol succinate  25 mg Oral BID WC  . multivitamin  1 tablet Oral Daily  . pantoprazole  40 mg Oral Daily  . predniSONE  20 mg Oral Daily  . senna-docusate  1 tablet Oral QHS  . sodium chloride  3 mL Intravenous Q12H  . tiotropium  18 mcg Inhalation Daily  . valACYclovir  1,000 mg Oral TID     Clarene Duke RD, LDN Pager [phone] After Hours pager [phone]

## 2012-09-08 NOTE — Progress Notes (Signed)
   CARE MANAGEMENT NOTE 09/08/2012  Patient:  [name] Stevenson, [name] Stevenson   Account Number:  [id number]  Date Initiated:  08/27/2012  Documentation initiated by:  Alvira Philips Assessment:   79 yr-old male adm with dx of resp failure; lives with spouse, has O2 and CPAP through [employer]     Action/Plan:   pt/ot eval-rec  hhpt   Anticipated DC Date:  09/06/2012   Anticipated DC Plan:  HOME W HOME HEALTH SERVICES      DC Planning Services  CM consult      [facility] Choice  HOME HEALTH   Choice offered to / List presented to:  C-3 Spouse        HH arranged  HH-1 RN  HH-2 PT  HH-4 NURSE'S AIDE      HH agency  [facility]   Status of service:  In process, will continue to follow Medicare Important Message given?   (If response is "NO", the following Medicare IM given date fields will be blank) Date Medicare IM given:   Date Additional Medicare IM given:    Discharge Disposition:    Per UR Regulation:  Reviewed for med. necessity/level of care/duration of stay  If discussed at Long Length of Stay Meetings, dates discussed:    Comments:  09/08/2012 1500 Received call back from LTAC. Pt does not qualify for LTAC transfer. Isidoro Donning RN CCM Case Mgmt phone [phone]  09/08/2012 1000 NCM made referral to LTAC. Isidoro Donning RN CCM Case Mgmt [phone]   09/03/12 16:36 Letha Cape RN, BSN [phone] patient 's spouse chose [facility] for tele health monitoring and HHPT.  Referral made to [facility], Lupita Leash notified. Soc will begin 24-48 hrs post discharge.  08/31/12 16:31 Letha Cape RN, BSN [phone] patient lives with spouse, patient is on 4l oxgen at home. patient has had hemoptysis, cre is elevated, cards following, pna showing on ct, pulmonary following as well. NCM will continue to follow for dc needs.  Per physical therapy recs hhpt.   PCP: Dr [name]

## 2012-09-08 NOTE — Progress Notes (Signed)
PULMONARY  / CRITICAL CARE MEDICINE  Name: [name] CORDARO Sr. MRN: [id number] DOB: September 15, 1932    ADMISSION DATE:  08/27/2012 CONSULTATION DATE:  08/28/2012  REFERRING MD :  Berton Mount PRIMARY SERVICE:  [facility]  CHIEF COMPLAINT:  Short of breath  BRIEF PATIENT DESCRIPTION:  79 yo male former smoker presented with severe dyspnea from acute pulmonary edema, acute diastolic dysfunction, and new onset A fib with RVR.  He is followed by Dr. [name] for severe COPD, chronic respiratory failure on home oxygen, OSA on BiPAP, and NSCLC s/p RULectomy and adjuvant chemo in 2006.  PCCM consulted to assist with respiratory management, and to assist determination whether he is candidate for cardioversion.  He was recently treated as outpt for Lt ankle cellulitis.  SIGNIFICANT EVENTS: 6/12> Admit with A fib RVR, acute diastolic heart failure 6/14> Hemoptysis on Xarelto=> stopped, placed on Levaquin/ Solumedrol. 6/15> wife reports similar lumps of bloody phlegm thru the night; seems diminished this AM, persist rhonchi/congestion.  STUDIES:  6/12 Echo >> mod LVH, EF 65 to 70%, mild/mod LA and RA dilation 615 CT chest>>>Markedly asymmetric bilateral airspace disease. Airspace disease is most prominent, with areas of consolidation, in the posterior and inferior right upper lobe ( the patient has a history of right lower lobectomy). Findings in the right upper lobe are favored to be due to pneumonia or aspiration. 2. New ground-glass opacities in the left upper lobe and left lower lobe may be due to early infection. Underlying mild pulmonary edema cannot be excluded in this patient with cardiomegaly and small bilateral pleural effusions. 3. New 5 mm pulmonary nodule left upper lobe. 4. Cardiomegaly with three-vessel coronary artery disease and dual lead cardiac pacer. 5. Small bilateral pleural effusions 6. Aortic valve calcifications. This finding raises the possibility of aortic valvular stenosis.   LINES /  TUBES: PIV  CULTURES: Blood 6/12 >> MRSA screen 6/12 >> POSITIVE BAL 6/17>>> rare GNR, rare yeast>>>E Coli and candida  ANTIBIOTICS: Doxycycline 6/10 >> 6/12 Vancomycin 6/12 >>6/14 Zosyn 6/12 >> 6/14 Levaquin 6/14(e coli in lung)>>>  Subjective/Overnight:  Still having scant hemoptysis    VITAL SIGNS: Temp:  [98 F (36.7 C)-98.3 F (36.8 C)] 98.3 F (36.8 C) (06/24 1026) Pulse Rate:  [74-87] 82 (06/24 1031) Resp:  [18-22] 22 (06/24 1031) BP: (115-152)/(75-91) 152/91 mmHg (06/24 1031) SpO2:  [92 %-97 %] 96 % (06/24 1208) Weight:  [94.8 kg (208 lb 15.9 oz)] 94.8 kg (208 lb 15.9 oz) (06/24 0711) 4 liters [place]  PHYSICAL EXAMINATION: General:  Chronically ill appearing male, No distress, speaks in full sentences, using accessory muscles Neuro:  Alert, follows commands, normal strength, seems anxious HEENT:  Mm moist, no JVD Cardiovascular:  Irregular, tachycardic, no murmur Lungs:  Decreased breath sounds, basilar rales, exp wheeze R>L but improved Abdomen:  Soft, non tender, + bowel sounds Musculoskeletal:  2+ leg edema R>L    Recent Labs Lab 09/06/12 0545 09/07/12 0640 09/08/12 0540  NA 137 139 139  K 4.0 3.3* 3.7  CL 99 102 100  CO2 30 28 29   BUN 64* 64* 61*  CREATININE 2.15* 2.23* 2.13*  GLUCOSE 131* 184* 127*    Recent Labs Lab 09/05/12 0500 09/07/12 0640 09/08/12 0540  HGB 12.4* 11.0* 11.7*  HCT 36.1* 33.2* 35.2*  WBC 19.0* 21.4* 19.6*  PLT 386 337 360   No results found.  BNP (last 3 results)  Recent Labs  08/28/12 0500 08/31/12 0500 09/06/12 0545  PROBNP 6086.0* 7566.0* 1793.0*  ASSESSMENT / PLAN:  Hemoptysis in setting of COPD exac and anticoagulation for new AFib.  Much improved. Still w scant blood. Etiology presumed to be PNA but duration makes this less clear. He denies epistaxis.   Plan: -Anticoag (Xarelto) stopped for now -- ??when to resume with continued AFib. Poss consider apixaban when restart anticoags. Would wait  until hemoptysis has fully subsided for 2-3 days.  -Ok to do dccv once the anticoag issue can be resolved -continue COPD Rx as below... -levaquin changed to imipenem  >>E coli on BAL is ESBL+, ID following abx -repeat CT scan chest to reassess parenchyma, consider other causes for hemoptysis such as pneumonitis or alveolar damage, diffuse hemorrhage   New LUL 5mm pulm nodule.  No Endobronchial lesion seen on FOB. (Previously followed by Dr Welton Flakes for oncology) PLAN: Fob cytology neg for CA F/u nodule later with imaging, no intervention at this time   Acute on chronic respiratory failure 2nd to acute pulmonary edema from acute diastolic heart failure and new onset A fib with RVR.  This in setting of severe COPD and OSA.  Now c/b PNA and hemoptysis.  E coli PNA (ESBL+) Plan: -Cont oxygen with goal SpO2 > 90% -tapering Po pred  -BiPAP qhs -negative fluid balance per cardiology and primary team  -continue GFN 1200mg  Bid -cont brovana nebulizer BID with prn xopenex nebulizer, hold budesonide while on prednisone -cont cautious diuresis per cards  -agree with changed abx to cover e coli (6/20)  Renal Insuffic:  Improving Plan: -lasix titration per [facility] and Cardiology  Insomnia. Plan: -prn xanax, trazodone  Levy Pupa, MD, PhD 09/08/2012, 2:23 PM [facility] [phone] or if no answer [phone]

## 2012-09-09 ENCOUNTER — Inpatient Hospital Stay (HOSPITAL_COMMUNITY): Payer: Medicare Other

## 2012-09-09 ENCOUNTER — Encounter (HOSPITAL_COMMUNITY): Payer: Self-pay | Admitting: Radiology

## 2012-09-09 DIAGNOSIS — G9340 Encephalopathy, unspecified: Secondary | ICD-10-CM

## 2012-09-09 DIAGNOSIS — I5032 Chronic diastolic (congestive) heart failure: Secondary | ICD-10-CM

## 2012-09-09 LAB — DIFFERENTIAL
Basophils Relative: 0 % (ref 0–1)
Eosinophils Absolute: 0 10*3/uL (ref 0.0–0.7)
Monocytes Absolute: 0.9 10*3/uL (ref 0.1–1.0)
Monocytes Relative: 5 % (ref 3–12)

## 2012-09-09 LAB — BASIC METABOLIC PANEL
BUN: 60 mg/dL — ABNORMAL HIGH (ref 6–23)
Chloride: 98 mEq/L (ref 96–112)
Creatinine, Ser: 2.28 mg/dL — ABNORMAL HIGH (ref 0.50–1.35)
GFR calc non Af Amer: 26 mL/min — ABNORMAL LOW (ref >90)
Glucose, Bld: 134 mg/dL — ABNORMAL HIGH (ref 70–99)
Potassium: 3.6 mEq/L (ref 3.5–5.1)

## 2012-09-09 LAB — CBC
HCT: 33.8 % — ABNORMAL LOW (ref 39.0–52.0)
Hemoglobin: 11.5 g/dL — ABNORMAL LOW (ref 13.0–17.0)
MCH: 31 pg (ref 26.0–34.0)
MCHC: 34 g/dL (ref 30.0–36.0)

## 2012-09-09 MED ORDER — SORBITOL 70 % SOLN
960.0000 mL | TOPICAL_OIL | Freq: Once | ORAL | Status: AC
  Administered 2012-09-09: 960 mL via RECTAL
  Filled 2012-09-09: qty 240

## 2012-09-09 NOTE — Evaluation (Signed)
Clinical/Bedside Swallow Evaluation Patient Details  Name: [name] ESCHMANN Sr. MRN: [id number] Date of Birth: [birth year]  Today's Date: 09/09/2012 Time: 1240-1259 SLP Time Calculation (min): 19 min  Past Medical History:  Past Medical History  Diagnosis Date  . Stricture and stenosis of esophagus 12/07/2004    EGD done on 12/07/2004  . GERD (gastroesophageal reflux disease)   . Hypoxemia     With chronic respiratory failure  . Increased prostate specific antigen (PSA) velocity 02/26/2011  . DM (diabetes mellitus) 02/26/2011  . Gout 02/26/2011  . COPD (chronic obstructive pulmonary disease)     oxygen at home, 4L  . OSA (obstructive sleep apnea) 02/26/2011    CPAP  . Paroxysmal atrial fibrillation   . Depression   . Cardiac arrest 2009    while in hospital   . Hypertension   . HTN (hypertension) 02/26/2011  . Cardiac arrest 2009    while in hospital  . Lung cancer     lung s/p lobectomy x2 on RT lung  . Basal cell carcinoma of nose 02/26/2011  . ETOH abuse 01/29/2012    started drinking again - hx cardiac arrest in 2009 due to dt's with organ failure  . CKD (chronic kidney disease) 02/28/2012  . Chronic diastolic CHF (congestive heart failure)     a. EF 55-60% by echo 2013.  . Sick sinus syndrome     a. s/p [employer] pacemaker 2010.  . Bradycardia     a. Admitted 2010 with severe bradycardia, runs of paroxysmal VT.   Marland Kitchen Paroxysmal VT     a. In 2010 in setting of marked bradycardia.  Marland Kitchen CAD (coronary artery disease)     a. Nonobstructive CAD by cath 2006.  Marland Kitchen Esophageal ulcer     a. By EGD 10/2011.  Marland Kitchen Hiatal hernia     a. By EGD 10/2011.  . Valvular heart disease     a. Mild MR/mild AI/mild AS by echo 05/2011.   Past Surgical History:  Past Surgical History  Procedure Laterality Date  . Lung lobectomy      x2 lobes on RT lung  . Knee arthroscopy      bilateral  . Pace maker    . Tonsillectomy    . Hernia repair      x2, esophageal  . Cardiac catheterization    .  Esophagogastroduodenoscopy  10/25/2011    Procedure: ESOPHAGOGASTRODUODENOSCOPY (EGD);  Surgeon: Hart Carwin, MD;  Location: [facility];  Service: Endoscopy;  Laterality: N/A;  . Video bronchoscopy N/A 09/01/2012    Procedure: VIDEO BRONCHOSCOPY WITHOUT FLUORO;  Surgeon: Storm Frisk, MD;  Location[facility] END[facility]COPY;  Service: Cardiopulmonary;  Laterality: N/A;   HPI:  [name] ENDICOTT Sr. is a 79 y.o. male  former smoker CAD s/p pacemaker, NSCLCa s/p RULectomy and adjuvant chemo in 2006, has severe COPD on home O2, OSA on BIPAP, presented with severe dyspnea/hemoptysis from acute pulmonary edema, acute diastolic dysfunction, and new onset A fib with RVR.  Bedside swallow eval ordered given new symptoms of dysarthria and coughing with meals per wife.     Assessment / Plan / Recommendation Clinical Impression  Pt presents with no deficits suggesting an oropharyngeal dysphagia.  His wife describes increased coughing during this admission, associated with consumption of liquids.  S/s were not observed during eval. We discussed the close anatomical relationship between breathing and swallowing and COPD's potential impact on decreased coordination of the swallow.  Reviewed signs of  reduced airway protection and  strategies to facilitate swallowing/ventilatory coordination.  No further SLP needs identified; rec continue regular diet / thin liquids.     Aspiration Risk  Mild    Diet Recommendation Regular;Thin liquid   Liquid Administration via: Cup Medication Administration: Whole meds with puree Supervision: Patient able to self feed Compensations: Slow rate;Small sips/bites Postural Changes and/or Swallow Maneuvers: Seated upright 90 degrees    Other  Recommendations Oral Care Recommendations: Oral care BID   Follow Up Recommendations  None           SLP Swallow Goals     Swallow Study Prior Functional Status       General Date of Onset: 09/08/12 HPI: [name] KITTEL Sr. is a 79 y.o.  male  former smoker CAD s/p pacemaker, NSCLCa s/p RULectomy and adjuvant chemo in 2006, has severe COPD on home O2, OSA on BIPAP, presented with severe dyspnea/hemoptysis from acute pulmonary edema, acute diastolic dysfunction, and new onset A fib with RVR.  Bedside swallow eval ordered given new symptoms of dysarthria and coughing with meals per wife.   Type of Study: Bedside swallow evaluation Previous Swallow Assessment: none per records Diet Prior to this Study: Regular;Thin liquids Temperature Spikes Noted: No Respiratory Status: Supplemental O2 delivered via (comment) Behavior/Cognition: Alert;Cooperative Oral Cavity - Dentition: Missing dentition Self-Feeding Abilities: Able to feed self Patient Positioning: Upright in bed Baseline Vocal Quality: Hoarse Volitional Cough: Strong Volitional Swallow: Able to elicit    Oral/Motor/Sensory Function Overall Oral Motor/Sensory Function: Appears within functional limits for tasks assessed   Ice Chips Ice chips: Within functional limits   Thin Liquid Thin Liquid: Within functional limits Presentation: Cup    Nectar Thick Nectar Thick Liquid: Not tested   Honey Thick Honey Thick Liquid: Not tested   Puree Puree: Within functional limits   Solid   GO    Solid: Within functional limits       Blenda Mounts [name] 09/09/2012,1:07 PM

## 2012-09-09 NOTE — Consult Note (Addendum)
Referring Physician: elmahi    Chief Complaint: right sided numbness  HPI:                                                                                                                                         [name] Stevenson Sr. is an 79 y.o. male former smoker CAD s/p pacemaker for sick sinus syndrome, NSCLCa s/p RULectomy and adjuvant chemo in 2006, with severe COPD on home O2, who presented with severe dyspnea/hemoptysis from acute pulmonary edema, acute diastolic dysfunction, and new onset A fib with RVR on 08/31/12. On admission he was started on heparin and xeralto , however on 6.14.14 patient had new onset hemoptysis and xeralto was stopped. During his stay patient has had recurrent hemoptysis which likely will make [facility] in future not possible. Per PCCM it may be possible to restart AC in one week but will need to reassess. PAtietn currently also being treated for PNA with a (+) BAL of ESBL E coli, completed 9 days of levaquin which was stopped after BAL and now on Primaxin-Day 6, in addition there is concern for shingles and he was started on Valtrex 1 gram TID for 7 days. Last night patient developed right sided numbness and slurred speech. Neurology was asked to evaluate for possible stroke.    Date last known well: 6.24.14 Time last known well: Unable to determine tPA Given: No: unknown time onset and currently having hemoptysis  Past Medical History  Diagnosis Date  . Stricture and stenosis of esophagus 12/07/2004    EGD done on 12/07/2004  . GERD (gastroesophageal reflux disease)   . Hypoxemia     With chronic respiratory failure  . Increased prostate specific antigen (PSA) velocity 02/26/2011  . DM (diabetes mellitus) 02/26/2011  . Gout 02/26/2011  . COPD (chronic obstructive pulmonary disease)     oxygen at home, 4L  . OSA (obstructive sleep apnea) 02/26/2011    CPAP  . Paroxysmal atrial fibrillation   . Depression   . Cardiac arrest 2009    while in hospital   .  Hypertension   . HTN (hypertension) 02/26/2011  . Cardiac arrest 2009    while in hospital  . Lung cancer     lung s/p lobectomy x2 on RT lung  . Basal cell carcinoma of nose 02/26/2011  . ETOH abuse 01/29/2012    started drinking again - hx cardiac arrest in 2009 due to dt's with organ failure  . CKD (chronic kidney disease) 02/28/2012  . Chronic diastolic CHF (congestive heart failure)     a. EF 55-60% by echo 2013.  . Sick sinus syndrome     a. s/p [employer] pacemaker 2010.  . Bradycardia     a. Admitted 2010 with severe bradycardia, runs of paroxysmal VT.   Marland Kitchen Paroxysmal VT     a. In 2010 in setting of marked bradycardia.  Marland Kitchen CAD (  coronary artery disease)     a. Nonobstructive CAD by cath 2006.  Marland Kitchen Esophageal ulcer     a. By EGD 10/2011.  Marland Kitchen Hiatal hernia     a. By EGD 10/2011.  . Valvular heart disease     a. Mild MR/mild AI/mild AS by echo 05/2011.    Past Surgical History  Procedure Laterality Date  . Lung lobectomy      x2 lobes on RT lung  . Knee arthroscopy      bilateral  . Pace maker    . Tonsillectomy    . Hernia repair      x2, esophageal  . Cardiac catheterization    . Esophagogastroduodenoscopy  10/25/2011    Procedure: ESOPHAGOGASTRODUODENOSCOPY (EGD);  Surgeon: Hart Carwin, MD;  Location: [facility];  Service: Endoscopy;  Laterality: N/A;  . Video bronchoscopy N/A 09/01/2012    Procedure: VIDEO BRONCHOSCOPY WITHOUT FLUORO;  Surgeon: Storm Frisk, MD;  Location: [facility] ENDOSCOPY;  Service: Cardiopulmonary;  Laterality: N/A;    Family History  Problem Relation Age of Onset  . Heart disease Father   . Lung cancer Mother   . Cancer Other     lung cancer  . Heart disease Other   . Hypertension Other    Social History:  reports that he quit smoking about 16 years ago. His smoking use included Cigarettes. He has a 75 pack-year smoking history. His smokeless tobacco use includes Chew. He reports that  drinks alcohol. He reports that he does not use illicit  drugs.  Allergies: No Known Allergies  Medications:                                                                                                                           Prior to Admission:  Prescriptions prior to admission  Medication Sig Dispense Refill  . albuterol (PROVENTIL HFA;VENTOLIN HFA) 108 (90 BASE) MCG/ACT inhaler Inhale 2 puffs into the lungs every 6 (six) hours as needed for wheezing.  1 Inhaler  6  . allopurinol (ZYLOPRIM) 100 MG tablet Take 1 tablet (100 mg total) by mouth daily.  90 tablet  3  . ALPRAZolam (XANAX) 0.5 MG tablet Take 1 tablet (0.5 mg total) by mouth 3 (three) times daily as needed. For anxiety/sleeplessness.  90 tablet  3  . aspirin 81 MG tablet Take 81 mg by mouth daily.        Marland Kitchen atorvastatin (LIPITOR) 40 MG tablet Take 40 mg by mouth at bedtime.      . budesonide-formoterol (SYMBICORT) 160-4.5 MCG/ACT inhaler Inhale 2 puffs into the lungs 2 (two) times daily.  3 Inhaler  3  . citalopram (CELEXA) 10 MG tablet Take 1 tablet (10 mg total) by mouth daily.  90 tablet  3  . colchicine 0.6 MG tablet Take 1 tablet (0.6 mg total) by mouth daily as needed. For gout flares  90 tablet  3  . diltiazem (TIAZAC) 240 MG 24 hr capsule Take  1 capsule (240 mg total) by mouth daily.  90 capsule  3  . doxycycline (VIBRA-TABS) 100 MG tablet Take 1 tablet (100 mg total) by mouth 2 (two) times daily.  20 tablet  0  . ezetimibe (ZETIA) 10 MG tablet Take 10 mg by mouth daily.      . fish oil-omega-3 fatty acids 1000 MG capsule Take 2 g by mouth daily.        . Fluticasone-Salmeterol (ADVAIR) 500-50 MCG/DOSE AEPB Inhale 1 puff into the lungs every 12 (twelve) hours.      . furosemide (LASIX) 40 MG tablet 80-120 mg 2 (two) times daily. 120 mg in the morning and 80 mg in the afternoon      . hydrALAZINE (APRESOLINE) 50 MG tablet Take 1 tablet (50 mg total) by mouth 2 (two) times daily.  180 tablet  3  . HYDROcodone-acetaminophen (NORCO) 7.5-325 MG per tablet Take 1 tablet by mouth  every 6 (six) hours as needed for pain.  120 tablet  5  . hydroxypropyl methylcellulose (ISOPTO TEARS) 2.5 % ophthalmic solution Place 2 drops into both eyes as needed. For dry/irritated eyes.      . magnesium oxide (DIASENSE MAGNESIUM) 400 (241.3 MG) MG tablet Take 1 tablet (400 mg total) by mouth daily.  90 tablet  3  . metFORMIN (GLUCOPHAGE) 500 MG tablet Take 500 mg by mouth 2 (two) times daily with a meal.      . metoprolol succinate (TOPROL-XL) 50 MG 24 hr tablet Take 50 mg by mouth 2 (two) times daily. Take with or immediately following a meal.      . Multiple Vitamins-Minerals (EYE VITAMINS) TABS Take 1 tablet by mouth 2 (two) times daily.        . multivitamin (THERAGRAN) per tablet Take 1 tablet by mouth daily.        Marland Kitchen omeprazole (PRILOSEC) 20 MG capsule Take 1 tablet by mouth once daily. PHARMACY-PLEASE D/C NEXIUM RX...TOO EXPENSIVE FOR PATIENT.  90 capsule  1  . potassium chloride SA (K-DUR,KLOR-CON) 20 MEQ tablet Take 1 tablet (20 mEq total) by mouth daily.  90 tablet  3  . predniSONE (DELTASONE) 5 MG tablet Take 5 mg by mouth daily.      . temazepam (RESTORIL) 30 MG capsule Take 1 capsule (30 mg total) by mouth at bedtime.  90 capsule  1  . tiotropium (SPIRIVA) 18 MCG inhalation capsule Place 1 capsule (18 mcg total) into inhaler and inhale daily.  90 capsule  3  . vitamin B-12 (CYANOCOBALAMIN) 100 MCG tablet Take 50 mcg by mouth daily.        . vitamin E 600 UNIT capsule Take 600 Units by mouth 2 (two) times daily.        . nitroGLYCERIN (NITROSTAT) 0.4 MG SL tablet Place 1 tablet (0.4 mg total) under the tongue every 5 (five) minutes as needed for chest pain.  25 tablet  prn   Scheduled: . amiodarone  400 mg Oral BID  . arformoterol  15 mcg Nebulization BID  . atorvastatin  40 mg Oral QHS  . barrier cream  1 application Topical BID  . citalopram  10 mg Oral Daily  . diltiazem  180 mg Oral BID  . diphenhydrAMINE  25 mg Oral Q6H  . docusate sodium  200 mg Oral BID  . ezetimibe   10 mg Oral Daily  . fluconazole  100 mg Oral Daily  . furosemide  120 mg Intravenous BID  . gabapentin  300 mg Oral BID  . Gerhardt's butt cream   Topical BID  . guaiFENesin  1,200 mg Oral BID  . hydrALAZINE  50 mg Oral BID  . imipenem-cilastatin  250 mg Intravenous Q6H  . insulin aspart  0-9 Units Subcutaneous TID WC  . insulin glargine  10 Units Subcutaneous QHS  . magnesium oxide  400 mg Oral Daily  . metoprolol succinate  25 mg Oral BID WC  . multivitamin  1 tablet Oral Daily  . pantoprazole  40 mg Oral Daily  . predniSONE  20 mg Oral Daily  . senna-docusate  1 tablet Oral QHS  . sodium chloride  3 mL Intravenous Q12H  . sorbitol, milk of mag, mineral oil, glycerin (SMOG) enema  960 mL Rectal Once  . tiotropium  18 mcg Inhalation Daily  . valACYclovir  1,000 mg Oral TID    ROS:                                                                                                                                       History obtained from the patient  General ROS: negative for - chills, fatigue, fever, night sweats, weight gain or weight loss Psychological ROS: negative for - behavioral disorder, hallucinations, memory difficulties, mood swings or suicidal ideation Ophthalmic ROS: negative for - blurry vision, double vision, eye pain or loss of vision ENT ROS: negative for - epistaxis, nasal discharge, oral lesions, sore throat, tinnitus or vertigo Allergy and Immunology ROS: negative for - hives or itchy/watery eyes Hematological and Lymphatic ROS: negative for - bleeding problems, bruising or swollen lymph nodes Endocrine ROS: negative for - galactorrhea, hair pattern changes, polydipsia/polyuria or temperature intolerance Respiratory ROS: positive for - cough, hemoptysis, shortness of breath or wheezing Cardiovascular ROS: negative for - chest pain, dyspnea on exertion, edema or irregular heartbeat Gastrointestinal ROS: negative for - abdominal pain, diarrhea, hematemesis,  nausea/vomiting or stool incontinence Genito-Urinary ROS: negative for - dysuria, hematuria, incontinence or urinary frequency/urgency Musculoskeletal ROS: positive for - right shoulder weakness Neurological ROS: as noted in HPI Dermatological ROS: negative for rash and skin lesion changes  Neurologic Examination:                                                                                                      Blood pressure 136/90, pulse 71, temperature 97.5 F (36.4 C), temperature source Axillary, resp. rate 20, height 5\' 7"  (1.702 m), weight 97 kg (213 lb 13.5 oz), SpO2 98.00%.  Mental  Status: Alert, oriented, thought content appropriate.  Speech slightly dysarthric (patient gets out of breath easily and is very dry) without evidence of  Expressive and receptive aphasia.  Able to follow 3 step commands without difficulty. Cranial Nerves: II: Discs flat bilaterally; Visual fields grossly normal, pupils equal, round, reactive to light and accommodation III,IV, VI: ptosis not present, extra-ocular motions intact bilaterally V,VII: smile symmetric, facial light touch sensation normal bilaterally VIII: hearing normal bilaterally IX,X: gag reflex present XI: bilateral shoulder shrug XII: midline tongue extension Motor: Right : Upper extremity   4/5    Left:     Upper extremity   5/5  Lower extremity   4/5     Lower extremity   4/5 --When at rest he has polymyoclonus which wife states is baseline--patient has CKD Tone and bulk:normal tone throughout; no atrophy noted Sensory: Pinprick and light touch intact throughout, and stated to be the same bilaterally Deep Tendon Reflexes:  Right: Upper Extremity   Left: Upper extremity   biceps (C-5 to C-6) 1/4   biceps (C-5 to C-6) 1/4 tricep (C7) 1/4    triceps (C7) 1/4 Brachioradialis (C6) 1/4  Brachioradialis (C6) 1/4  Lower Extremity Lower Extremity  quadriceps (L-2 to L-4) 1/4   quadriceps (L-2 to L-4) 1/4 Achilles (S1) 0/4   Achilles  (S1) 0/4  Plantars: Mute bilaterally Cerebellar: normal finger-to-nose,  normal heel-to-shin test Gait: not tested CV: pulses palpable throughout    Results for orders placed during the hospital encounter of 08/27/12 (from the past 48 hour(s))  GLUCOSE, CAPILLARY     Status: Abnormal   Collection Time    09/07/12 12:33 PM      Result Value Range   Glucose-Capillary 123 (*) 70 - 99 mg/dL  GLUCOSE, CAPILLARY     Status: Abnormal   Collection Time    09/07/12  5:20 PM      Result Value Range   Glucose-Capillary 202 (*) 70 - 99 mg/dL  GLUCOSE, CAPILLARY     Status: Abnormal   Collection Time    09/07/12  9:14 PM      Result Value Range   Glucose-Capillary 231 (*) 70 - 99 mg/dL   Comment 1 Documented in Chart     Comment 2 Notify RN    BASIC METABOLIC PANEL     Status: Abnormal   Collection Time    09/08/12  5:40 AM      Result Value Range   Sodium 139  135 - 145 mEq/L   Potassium 3.7  3.5 - 5.1 mEq/L   Chloride 100  96 - 112 mEq/L   CO2 29  19 - 32 mEq/L   Glucose, Bld 127 (*) 70 - 99 mg/dL   BUN 61 (*) 6 - 23 mg/dL   Creatinine, Ser 1.61 (*) 0.50 - 1.35 mg/dL   Calcium 8.1 (*) 8.4 - 10.5 mg/dL   GFR calc non Af Amer 28 (*) >90 mL/min   GFR calc Af Amer 33 (*) >90 mL/min   Comment:            The eGFR has been calculated     using the CKD EPI equation.     This calculation has not been     validated in all clinical     situations.     eGFR's persistently     <90 mL/min signify     possible Chronic Kidney Disease.  CBC     Status: Abnormal   Collection Time  09/08/12  5:40 AM      Result Value Range   WBC 19.6 (*) 4.0 - 10.5 K/uL   RBC 3.79 (*) 4.22 - 5.81 MIL/uL   Hemoglobin 11.7 (*) 13.0 - 17.0 g/dL   HCT 16.1 (*) 09.6 - 04.5 %   MCV 92.9  78.0 - 100.0 fL   MCH 30.9  26.0 - 34.0 pg   MCHC 33.2  30.0 - 36.0 g/dL   RDW 40.9  81.1 - 91.4 %   Platelets 360  150 - 400 K/uL  GLUCOSE, CAPILLARY     Status: Abnormal   Collection Time    09/08/12  7:34 AM       Result Value Range   Glucose-Capillary 128 (*) 70 - 99 mg/dL  GLUCOSE, CAPILLARY     Status: Abnormal   Collection Time    09/08/12 12:45 PM      Result Value Range   Glucose-Capillary 134 (*) 70 - 99 mg/dL  BASIC METABOLIC PANEL     Status: Abnormal   Collection Time    09/08/12  2:25 PM      Result Value Range   Sodium 135  135 - 145 mEq/L   Potassium 4.2  3.5 - 5.1 mEq/L   Chloride 98  96 - 112 mEq/L   CO2 26  19 - 32 mEq/L   Glucose, Bld 336 (*) 70 - 99 mg/dL   BUN 62 (*) 6 - 23 mg/dL   Creatinine, Ser 7.82 (*) 0.50 - 1.35 mg/dL   Calcium 7.9 (*) 8.4 - 10.5 mg/dL   GFR calc non Af Amer 25 (*) >90 mL/min   GFR calc Af Amer 29 (*) >90 mL/min   Comment:            The eGFR has been calculated     using the CKD EPI equation.     This calculation has not been     validated in all clinical     situations.     eGFR's persistently     <90 mL/min signify     possible Chronic Kidney Disease.  GLUCOSE, CAPILLARY     Status: Abnormal   Collection Time    09/08/12  5:12 PM      Result Value Range   Glucose-Capillary 299 (*) 70 - 99 mg/dL  GLUCOSE, CAPILLARY     Status: Abnormal   Collection Time    09/08/12  9:34 PM      Result Value Range   Glucose-Capillary 179 (*) 70 - 99 mg/dL  BASIC METABOLIC PANEL     Status: Abnormal   Collection Time    09/09/12  5:00 AM      Result Value Range   Sodium 135  135 - 145 mEq/L   Potassium 3.6  3.5 - 5.1 mEq/L   Chloride 98  96 - 112 mEq/L   CO2 29  19 - 32 mEq/L   Glucose, Bld 134 (*) 70 - 99 mg/dL   BUN 60 (*) 6 - 23 mg/dL   Creatinine, Ser 9.56 (*) 0.50 - 1.35 mg/dL   Calcium 8.2 (*) 8.4 - 10.5 mg/dL   GFR calc non Af Amer 26 (*) >90 mL/min   GFR calc Af Amer 30 (*) >90 mL/min   Comment:            The eGFR has been calculated     using the CKD EPI equation.     This calculation has not been  validated in all clinical     situations.     eGFR's persistently     <90 mL/min signify     possible Chronic Kidney Disease.   CBC     Status: Abnormal   Collection Time    09/09/12  5:00 AM      Result Value Range   WBC 19.3 (*) 4.0 - 10.5 K/uL   RBC 3.71 (*) 4.22 - 5.81 MIL/uL   Hemoglobin 11.5 (*) 13.0 - 17.0 g/dL   HCT 16.1 (*) 09.6 - 04.5 %   MCV 91.1  78.0 - 100.0 fL   MCH 31.0  26.0 - 34.0 pg   MCHC 34.0  30.0 - 36.0 g/dL   RDW 40.9  81.1 - 91.4 %   Platelets 347  150 - 400 K/uL  DIFFERENTIAL     Status: Abnormal   Collection Time    09/09/12  5:00 AM      Result Value Range   Neutrophils Relative % 87 (*) 43 - 77 %   Neutro Abs 16.8 (*) 1.7 - 7.7 K/uL   Lymphocytes Relative 8 (*) 12 - 46 %   Lymphs Abs 1.5  0.7 - 4.0 K/uL   Monocytes Relative 5  3 - 12 %   Monocytes Absolute 0.9  0.1 - 1.0 K/uL   Eosinophils Relative 0  0 - 5 %   Eosinophils Absolute 0.0  0.0 - 0.7 K/uL   Basophils Relative 0  0 - 1 %   Basophils Absolute 0.0  0.0 - 0.1 K/uL  GLUCOSE, CAPILLARY     Status: Abnormal   Collection Time    09/09/12  7:52 AM      Result Value Range   Glucose-Capillary 104 (*) 70 - 99 mg/dL   Ct Chest Wo Contrast  09/08/2012   *RADIOLOGY REPORT*  Clinical Data: Hemoptysis.  Prior right upper lobectomy for non- small cell lung cancer with chemotherapy 2006.  CT CHEST WITHOUT CONTRAST  Technique:  Multidetector CT imaging of the chest was performed following the standard protocol without IV contrast.  Comparison: Several prior CT scans most recent 08/30/2012 and 02/05/2012  Findings: There is been minimal improvement in the pulmonary parenchymal changes involving both lungs asymmetric greater on the right.  What remains may represent result of infection/aspiration. Interstitial spread of tumor not entirely excluded although a secondary less likely consideration.  Follow-up until complete clearance is recommended given the patient's history.  5 mm left upper lobe nodule  new compared to 2013 examination. Tumor not excluded.  Top normal sized low pretracheal lymph node with short axis dimension of 9.3 mm.   Cardiomegaly with sequential pacemaker in place.  Prominent coronary artery calcifications.  No pericardial effusion.  Atherosclerotic type changes of the aorta with ectasia similar to the prior exam.  Visualized upper abdominal structures without acute abnormality.  No bony destructive lesion.  Degenerative changes thoracic spine.  IMPRESSION: There is been minimal improvement in the pulmonary parenchymal changes involving both lungs asymmetric greater on the right.  What remains may represent result of infection/aspiration.  Interstitial spread of tumor not entirely excluded although a secondary less likely consideration.  Follow-up until complete clearance is recommended given the patient's history.  5 mm left upper lobe nodule  new compared to 2013 examination. Tumor not excluded.  Please see above.   Original Report Authenticated By: Lacy Duverney, [name]   2-D echocardiogram  - Left ventricle: The cavity size was normal. Wall thickness was increased in  a pattern of moderate LVH. Systolic function was vigorous. The estimated ejection fraction was in the range of 65% to 70%. Wall motion was normal; there were no regional wall motion abnormalities. - Left atrium: The atrium was mildly to moderately dilated. - Right atrium: The atrium was mildly to moderately dilated  Assessment and plan discussed with with attending physician and they are in agreement.    [name] Morn PA-C [facility] [phone]  09/09/2012, 9:42 AM   Assessment: 79 y.o. male presenting with SOB, PNA, new onset Afib, and hemoptysis after starting  anticoagulation which was then D/c'd.  One day ago patient and wife noted his speech has been slightly slurred and his right arm was weaker. Currently exam shows right arm weakness and some slight dysarthria. Given Afib and on no antiplatelet or anticoagulation due to hemoptysis cannot rule out CVA. Patient is unable to have MRI due pacemaker.   Stroke Risk Factors - atrial  fibrillation, diabetes mellitus and hypertension  Plan:  1) CT head as patient is unable to have MRI 2) initiate Antiplatelet/anticoagulation per cardiology and PCCM when felt to be safe 3) stroke risk assessment  [name] Morn PA-C [facility] [phone]  I personally participated in this patient's evaluation and management including formulating the above clinical impression and management recommendations.  [name] Stevenson [name] [facility] [phone]

## 2012-09-09 NOTE — Progress Notes (Signed)
PATIENT DETAILS Name: [name] ENRIQUES Sr. Age: 79 y.o. Sex: male Date of Birth: [birth year] Admit Date: 08/27/2012 Admitting Physician Clydia Llano, MD YQM:VHQIO [name], MD  Subjective: Patient and wife complaining of weakness in his right arm and difficulty speaking clearly.  Per this wife these symptoms started yesterday PM (6/24).  Assessment/Plan: Acute-on-chronic respiratory failure with hypoxia due to:  Acute on chronic diastolic heart failure CHF  Acute on CHRONIC OBSTRUCTIVE PULMONARY DISEASE, SEVERE - Continued slow going - patient progress appears to have stalled. - Lasix 120 mg bid - strict Is and Os (have discussed tracking these correctly with the unit director) - Cont supportive care with nebs/MDI  - prednisone decreased to 20 mg on 6/22.  slow taper - Do not lie flat, keep head of bed raised. - 6/25 have call into cardiology to discuss plan of care.  Dysarthia -patient and wife noticed that he began having difficulty speaking yesterday afternoon.  -difficulty with enunciation evident on physical exam this am. -Discontinue gabapentin started 6/24 for neuropathic pain from shingles -Neuro consulted.  Unable to do MRI due to pacemaker.  PNA -BAL positive for ESBL E Coli -completed 9 days of levaquin-stop Levaquin, now on Primaxin-Day 6 -Appreciate ID consult.  Dr. Drue Second recommends 10 - 14 days of antibiotic treatment.(antibiotics end 6/30 - 7/3)  Shingles on left buttock and scrotum -Valtrex started 6/23 -Supportive skin care  Yeast in groin area -diflucan orally -Topical (Gerhardt's butt) cream to affected area  Acute diastolic heart failure - Cardiology following and managing diuresis - Continue with Lasix infusion - Requesting strict Is&Os and Daily weights - Creatinine relatively stable  Atrial fibrillation - Unfortunately off anti-coagulation given hemoptysis, per Dr. [name] ok to start Epixiban i2-3 days after hemoptysis resolves. - On metoprolol and  Cardizem for rate control -Tolerating Amiodarone well  Hemoptysis -Etiology? Recurrence of underlying malignancy vs PNA - Overall much better-but does recur intermittently.  3 small episodes 6/23, 1 small episode 6/24 -  Status post a bronchoscopy with no endobronchial lesions seen, only old clot seen on bronchoscopy - BAL +ve for ESBL E Coli-now on Primaxin  CKD (chronic kidney disease), stage III  - Creatinine stable at approx 2.15. Monitor renal function closely.  Leukocytosis - WBC elevated but stable - Multifactorial from possible pneumonia, on steroids - Monitor  Hypertension - Controlled- continue with current medication  Diabetes - Lantus at 10 units, continue with SSI-s  CORONARY ARTERY DISEASE (nonobstructive disease 2006 catheterization)/Elevation of cardiac enzymes  -no CP and likely due to acute HF, demand ischemia from recent hypoxia and worsened renal function - Cardiology following  Cellulitis of left ankle  -has decreased rednessboth legs c/w stasis dermatitis- resolved   NSCLC s/p RULectomy and adjuvant chemo in 2006 - Does have a new lung nodule-will need close followup as an outpatient  Disposition: Remain inpatient.  Consider LTAC.  Consult has been placed with Case Management.  DVT Prophylaxis:  SCD's  Code Status: Full code  Family Communication Spouse at bedside  Procedures:  None  CONSULTS:  cardiology and pulmonary/intensive care Infectious disease.   MEDICATIONS: Scheduled Meds: . amiodarone  400 mg Oral BID  . arformoterol  15 mcg Nebulization BID  . atorvastatin  40 mg Oral QHS  . barrier cream  1 application Topical BID  . citalopram  10 mg Oral Daily  . diltiazem  180 mg Oral BID  . diphenhydrAMINE  25 mg Oral Q6H  . docusate sodium  200 mg Oral BID  .  ezetimibe  10 mg Oral Daily  . fluconazole  100 mg Oral Daily  . furosemide  120 mg Intravenous BID  . Gerhardt's butt cream   Topical BID  . guaiFENesin  1,200 mg Oral  BID  . hydrALAZINE  50 mg Oral BID  . imipenem-cilastatin  250 mg Intravenous Q6H  . insulin aspart  0-9 Units Subcutaneous TID WC  . insulin glargine  10 Units Subcutaneous QHS  . magnesium oxide  400 mg Oral Daily  . metoprolol succinate  25 mg Oral BID WC  . multivitamin  1 tablet Oral Daily  . pantoprazole  40 mg Oral Daily  . predniSONE  20 mg Oral Daily  . senna-docusate  1 tablet Oral QHS  . sodium chloride  3 mL Intravenous Q12H  . tiotropium  18 mcg Inhalation Daily  . valACYclovir  1,000 mg Oral TID   Continuous Infusions:  PRN Meds:.ALPRAZolam, Glycerin (Adult), levalbuterol, morphine injection, nitroGLYCERIN, ondansetron (ZOFRAN) IV, ondansetron, oxyCODONE, polyvinyl alcohol, sodium chloride, traZODone  Antibiotics: Anti-infectives   Start     Dose/Rate Route Frequency Ordered Stop   09/08/12 1100  fluconazole (DIFLUCAN) tablet 100 mg     100 mg Oral Daily 09/08/12 1013     09/07/12 1600  valACYclovir (VALTREX) tablet 1,000 mg     1,000 mg Oral 3 times daily 09/07/12 1303 09/14/12 1559   09/04/12 1200  imipenem-cilastatin (PRIMAXIN) 250 mg in sodium chloride 0.9 % 100 mL IVPB     250 mg 200 mL/hr over 30 Minutes Intravenous 4 times per day 09/04/12 0856     08/29/12 1200  levofloxacin (LEVAQUIN) tablet 500 mg  Status:  Discontinued     500 mg Oral Every 48 hours 08/29/12 0957 09/04/12 1008   08/27/12 0900  vancomycin (VANCOCIN) 1,250 mg in sodium chloride 0.9 % 250 mL IVPB  Status:  Discontinued     1,250 mg 166.7 mL/hr over 90 Minutes Intravenous Every 24 hours 08/27/12 0826 08/28/12 1809   08/27/12 0900  piperacillin-tazobactam (ZOSYN) IVPB 3.375 g  Status:  Discontinued     3.375 g 12.5 mL/hr over 240 Minutes Intravenous 3 times per day 08/27/12 0826 08/28/12 1809   08/27/12 0600  levofloxacin (LEVAQUIN) IVPB 500 mg     500 mg 100 mL/hr over 60 Minutes Intravenous  Once 08/27/12 0554 08/27/12 0735       PHYSICAL EXAM: Vital signs in last 24 hours: Filed  Vitals:   09/08/12 2136 09/09/12 0432 09/09/12 0817 09/09/12 1417  BP: 150/89 152/85 136/90 129/83  Pulse: 86 72 71 69  Temp: 98.1 F (36.7 C) 97.5 F (36.4 C)  97.9 F (36.6 C)  TempSrc: Oral Axillary  Oral  Resp: 18 20  20   Height:      Weight:  97 kg (213 lb 13.5 oz)    SpO2: 92% 98%  98%    Weight change:  Filed Weights   09/07/12 0700 09/08/12 0711 09/09/12 0432  Weight: 94.6 kg (208 lb 8.9 oz) 94.8 kg (208 lb 15.9 oz) 97 kg (213 lb 13.5 oz)   Body mass index is 33.49 kg/(m^2).   Gen Exam: Awake and alert, speech is slurred (it was clear 6/24 am). Neck: Supple, No JVD.   Chest: minimal crackles, good air movement. CVS: S1 S2 Regular, no murmurs.  Abdomen: soft, BS +, non tender, non distended.  Extremities: 3+ edema on dorsum of feet bilaterally, lower extremities warm to touch.  Neurologic: Difficulty enunciating clearly, but coherent thought, strength  is symmetric  Skin: multiple areas of rash.  Erythematous scale on left buttock that extends to scrotum (looks like shingles).   Plantar portion of feet are decreasing in redness (confluent erythema), and groin is red as well w/o scale - appears to be yeast.   Intake/Output from previous day:  Intake/Output Summary (Last 24 hours) at 09/09/12 1528 Last data filed at 09/09/12 1207  Gross per 24 hour  Intake   1424 ml  Output   2950 ml  Net  -1526 ml     LAB RESULTS: CBC  Recent Labs Lab 09/04/12 0515 09/05/12 0500 09/07/12 0640 09/08/12 0540 09/09/12 0500  WBC 19.0* 19.0* 21.4* 19.6* 19.3*  HGB 10.9* 12.4* 11.0* 11.7* 11.5*  HCT 32.6* 36.1* 33.2* 35.2* 33.8*  PLT 306 386 337 360 347  MCV 91.1 91.9 93.0 92.9 91.1  MCH 30.4 31.6 30.8 30.9 31.0  MCHC 33.4 34.3 33.1 33.2 34.0  RDW 15.3 15.2 15.4 15.5 15.5  LYMPHSABS  --  1.4  --   --  1.5  MONOABS  --  1.3*  --   --  0.9  EOSABS  --  0.0  --   --  0.0  BASOSABS  --  0.0  --   --  0.0    Chemistries   Recent Labs Lab 09/06/12 0545 09/07/12 0640  09/08/12 0540 09/08/12 1425 09/09/12 0500  NA 137 139 139 135 135  K 4.0 3.3* 3.7 4.2 3.6  CL 99 102 100 98 98  CO2 30 28 29 26 29   GLUCOSE 131* 184* 127* 336* 134*  BUN 64* 64* 61* 62* 60*  CREATININE 2.15* 2.23* 2.13* 2.36* 2.28*  CALCIUM 8.5 8.0* 8.1* 7.9* 8.2*    CBG:  Recent Labs Lab 09/08/12 1245 09/08/12 1712 09/08/12 2134 09/09/12 0752 09/09/12 1315  GLUCAP 134* 299* 179* 104* 132*    Urine Studies MICROBIOLOGY: Recent Results (from the past 240 hour(s))  CULTURE, EXPECTORATED SPUTUM-ASSESSMENT     Status: None   Collection Time    08/31/12  8:38 AM      Result Value Range Status   Specimen Description SPUTUM   Final   Special Requests NONE   Final   Sputum evaluation     Final   Value: MICROSCOPIC FINDINGS SUGGEST THAT THIS SPECIMEN IS NOT REPRESENTATIVE OF LOWER RESPIRATORY SECRETIONS. PLEASE RECOLLECT.     CALLED TO Mackie Pai 08/31/12 0913 BY K SCHULTZ   Report Status 08/31/2012 FINAL   Final  CULTURE, EXPECTORATED SPUTUM-ASSESSMENT     Status: None   Collection Time    08/31/12  1:10 PM      Result Value Range Status   Specimen Description SPUTUM   Final   Special Requests NONE   Final   Sputum evaluation     Final   Value: THIS SPECIMEN IS ACCEPTABLE. RESPIRATORY CULTURE REPORT TO FOLLOW.   Report Status 08/31/2012 FINAL   Final  CULTURE, RESPIRATORY (NON-EXPECTORATED)     Status: None   Collection Time    08/31/12  1:10 PM      Result Value Range Status   Specimen Description SPUTUM   Final   Special Requests NONE   Final   Gram Stain     Final   Value: RARE WBC PRESENT, PREDOMINANTLY PMN     RARE SQUAMOUS EPITHELIAL CELLS PRESENT     NO ORGANISMS SEEN   Culture NORMAL OROPHARYNGEAL FLORA   Final   Report Status 09/02/2012 FINAL   Final  AFB CULTURE WITH SMEAR     Status: None   Collection Time    09/01/12 11:19 AM      Result Value Range Status   Specimen Description BRONCHIAL WASHINGS   Final   Special Requests NONE   Final   ACID  FAST SMEAR NO ACID FAST BACILLI SEEN   Final   Culture     Final   Value: CULTURE WILL BE EXAMINED FOR 6 WEEKS BEFORE ISSUING A FINAL REPORT   Report Status PENDING   Incomplete  FUNGUS CULTURE W SMEAR     Status: None   Collection Time    09/01/12 11:19 AM      Result Value Range Status   Specimen Description BRONCHIAL WASHINGS   Final   Special Requests NONE   Final   Fungal Smear FEW HYPHAL ELEMENTS   Final   Culture CANDIDA ALBICANS   Final   Report Status PENDING   Incomplete  LEGIONELLA CULTURE     Status: None   Collection Time    09/01/12 11:19 AM      Result Value Range Status   Specimen Description BRONCHIAL WASHINGS   Final   Special Requests NONE   Final   Culture NO LEGIONELLA ISOLATED   Final   Report Status 09/06/2012 FINAL   Final  CULTURE, RESPIRATORY (NON-EXPECTORATED)     Status: None   Collection Time    09/01/12 11:19 AM      Result Value Range Status   Specimen Description BRONCHIAL WASHINGS   Final   Special Requests NONE   Final   Gram Stain     Final   Value: RARE WBC PRESENT, PREDOMINANTLY PMN     RARE SQUAMOUS EPITHELIAL CELLS PRESENT     RARE YEAST     RARE GRAM NEGATIVE RODS   Culture     Final   Value: FEW ESCHERICHIA COLI     Note: Confirmed Extended Spectrum Beta-Lactamase Producer (ESBL) CRITICAL RESULT CALLED TO, READ BACK BY AND VERIFIED WITH: JENNIFER Z@7 :45AM ON 09/04/12 BY DANTS     FEW CANDIDA ALBICANS   Report Status 09/04/2012 FINAL   Final   Organism ID, Bacteria ESCHERICHIA COLI   Final    RADIOLOGY STUDIES/RESULTS: Dg Chest 2 View  09/01/2012   *RADIOLOGY REPORT*  Clinical Data: Shortness of breath.  CHEST - 2 VIEW  Comparison: 08/31/2012.  Findings: The left subclavian Port-A-Cath tip it is most likely in the mid SVC at the level of the carina.  Stable pacer wires.  The heart is enlarged but unchanged.  Persistent bilateral infiltrates and effusions.  IMPRESSION:  1.  The Port-A-Cath tip appears to be in the mid SVC. 2.  Persistent  bilateral infiltrates and small effusions.   Original Report Authenticated By: Rudie Meyer, [name]   Ct Chest Wo Contrast  08/30/2012   *RADIOLOGY REPORT*  Clinical Data: Hemoptysis.  Pneumonia versus edema.  CT CHEST WITHOUT CONTRAST  Technique:  Multidetector CT imaging of the chest was performed following the standard protocol without IV contrast.  Comparison: Chest radiograph 08/30/2012 and chest CT 02/05/2012  Findings: Heart is enlarged.  Cardiomegaly appears stable. Stable prominent fat density, consistent with lipoma, between the left and right atria.  Dual lead pacer is present.  There is heavy coronary artery atherosclerotic calcification of all three coronary arteries.  Aortic valvular calcifications are present.  A left IJ central venous catheter terminates in the superior vena cava. Thyroid gland and thoracic inlet appear within normal  limits.  Heavy atherosclerotic calcification of the thoracic aorta, without aneurysm.  Several mediastinal lymph nodes are visualized, and within normal limits for size.  No pathologically enlarged lymph nodes are seen in the thorax.  Evaluation for the hilar lymph nodes is somewhat limited without intravenous contrast.  Small bilateral pleural effusions are present.  Multilevel degenerative changes of the thoracic spine.  Multiple benign hemangiomas in the thoracic spine vertebral bodies.  No acute or suspicious osseous abnormality.  No acute findings are identified in the abdomen.  Surgical clips are seen in the left upper quadrant.  There are postsurgical changes of the right lower lobectomy.  There is a background of centrilobular emphysema.  Stable scarring in the right upper lobe anteriorly.  No definite intralobular septal thickening.  There is markedly asymmetric airspace disease in the inferior and posterior aspect of the right upper lobe, where there are areas of consolidation.  There are ground-glass opacities which are new compared to prior chest CT in the  left upper lobe, left lower lobe, and right middle lobe.  5 mm pulmonary nodule in the left upper lobe peripherally on image #27 of the lung windows is new.  IMPRESSION:  1.  Markedly asymmetric bilateral airspace disease.  Airspace disease is most prominent, with areas of consolidation, in the posterior and inferior right upper lobe ( the patient has a history of right lower lobectomy).  Findings in the right upper lobe are favored to be due to pneumonia or aspiration. 2.  New ground-glass opacities in the left upper lobe and left lower lobe may be due to early infection.  Underlying mild pulmonary edema cannot be excluded in this patient with cardiomegaly and small bilateral pleural effusions. 3.  New 5 mm pulmonary nodule left upper lobe. If the patient is at high risk for bronchogenic carcinoma, follow-up chest CT at 6-12 months is recommended.  If the patient is at low risk for bronchogenic carcinoma, follow-up chest CT at 12 months is recommended.  This recommendation follows the consensus statement: Guidelines for Management of Small Pulmonary Nodules Detected on CT Scans: A Statement from the Fleischner Society as published in Radiology 2005; 237:395-400. 4.  Cardiomegaly with three-vessel coronary artery disease and dual lead cardiac pacer. 5.  Small bilateral pleural effusions 6.  Aortic valve calcifications. This finding raises the possibility of aortic valvular stenosis.  .   Original Report Authenticated By: Britta Mccreedy, [name]   Dg Chest Port 1 View  08/31/2012   *RADIOLOGY REPORT*  Clinical Data: Follow-up edema  PORTABLE CHEST - 1 VIEW  Comparison: Yesterday  Findings: The tip of the left subclavian Port-A-Cath has changed its orientation.  Based on a single view, it is likely in the azygos vein.  Right subclavian dual lead pacemaker device and leads are stable and intact.  Bilateral pleural effusions left greater than right are stable.  Diffuse edema is stable. Cardiomegaly.  IMPRESSION: Left  subclavian Port-A-Cath tip has changed its position and is likely in the azygos vein.  This can be confirmed with a lateral view.  Stable airspace disease and pleural effusions.   Original Report Authenticated By: Jolaine Click, [name]   Dg Chest Port 1 View  08/30/2012   *RADIOLOGY REPORT*  Clinical Data: Respiratory failure.  PORTABLE CHEST - 1 VIEW  Comparison: 08/29/2012 and 08/27/2012  Findings: Prominent interstitial markings could represent underlying edema.  There are persistent interstitial and airspace densities in the right mid and lower lung region.  Heart size remains enlarged.  Again noted is a right dual lead cardiac pacemaker.  Left subclavian Port-A-Cath in the SVC region.  Aortic arch is heavily calcified.  IMPRESSION:  Prominent interstitial markings may represent underlying edema. Again noted are increased densities in the right mid and lower lung region which could represent asymmetric edema but cannot exclude an infectious etiology.   Original Report Authenticated By: Richarda Overlie, [name]   Dg Chest Port 1 View  08/29/2012   *RADIOLOGY REPORT*  Clinical Data: Follow-up CHF  PORTABLE CHEST - 1 VIEW  Comparison: Prior chest x-ray 08/27/2012  Findings: Stable position of left subclavian central venous catheter with the tip in the mid superior vena cava.  Right subclavian approach cardiac rhythm maintenance device with leads in the right atrium and right ventricle.  Slightly improved inspiratory volumes with clearing in the right base.  Stable elevation of the left hemidiaphragm resulting in blunting of the left costophrenic angle.  Decreasing interstitial edema with the residual asymmetric patchy opacity in the right mid and lower lobe. Small residual right pleural effusion.  Stable cardiomegaly. Aortic atherosclerosis again noted.  No pneumothorax.  IMPRESSION:  1.  Decreasing pulmonary edema with improved inspiratory volumes and clearing in the right base. 2.  Residual asymmetric patchy opacity in  the right mid and lower lung may reflect residual asymmetric alveolar edema, or pneumonia. 3.  Persistent small right pleural effusion, chronic elevation of the left hemidiaphragm and cardiomegaly.   Original Report Authenticated By: Malachy Moan, [name]   Dg Chest Portable 1 View  08/27/2012   *RADIOLOGY REPORT*  Clinical Data: Shortness of breath.  History of lung cancer post lobectomy.  PORTABLE CHEST - 1 VIEW  Comparison: 02/02/2012  Findings: Shallow inspiration.  Cardiac enlargement with pulmonary vascular congestion and perihilar edema.  Bilateral pleural effusions.  Atelectasis in the lung bases.  No pneumothorax. Changes have developed since the previous study.  Postoperative changes in the right chest.  Stable appearance of cardiac pacemaker and left central venous catheter.  Calcified and tortuous aorta.  IMPRESSION: Interval development of cardiac enlargement, pulmonary vascular congestion, perihilar edema, and bilateral pleural effusions.   Original Report Authenticated By: Burman Nieves, [name]    [facility] Pager:[phone]  If 7PM-7AM, please contact night-coverage [email or web address] Password [email or web address] 09/09/2012, 3:28 PM   LOS: 13 days

## 2012-09-09 NOTE — Progress Notes (Signed)
PT Cancellation Note  Patient Details Name: [name] DESILETS Sr. MRN: [id number] DOB: 01-Nov-1932   Cancelled Treatment:    Reason Eval/Treat Not Completed: Patient at procedure or test/unavailable   Donnetta Hail 09/09/2012, 3:07 PM

## 2012-09-09 NOTE — Progress Notes (Signed)
[facility]    Date of Admission:  08/27/2012   Total days of antibiotics 15        Day 6 imipenem        Day 3 valtrex           ID: [name] Newcomer Sr. is a 79 y.o. male  former smoker CAD s/p pacemaker, NSCLCa s/p RULectomy and adjuvant chemo in 2006, has severe COPD on home O2, OSA on BIPAP, presented with severe dyspnea/hemoptysis from acute pulmonary edema, acute diastolic dysfunction, and new onset A fib with RVR.   Principal Problem:   Acute-on-chronic respiratory failure Active Problems:   CARCINOMA, LUNG, SQUAMOUS CELL   CHRONIC OBSTRUCTIVE PULMONARY DISEASE, SEVERE   HTN (hypertension)   DM (diabetes mellitus) type II controlled with renal manifestation   Atrial fibrillation   CKD (chronic kidney disease), stage III   Cellulitis of left ankle   Acute on chronic diastolic congestive heart failure   Leukocytosis   Elevation of cardiac enzymes   Hemoptysis   Pneumonia due to Gram-negative bacteria   Acute encephalopathy    Subjective: Constipated. Rash on buttocks feels better. afebrile  Medications:  . amiodarone  400 mg Oral BID  . arformoterol  15 mcg Nebulization BID  . atorvastatin  40 mg Oral QHS  . barrier cream  1 application Topical BID  . citalopram  10 mg Oral Daily  . diltiazem  180 mg Oral BID  . diphenhydrAMINE  25 mg Oral Q6H  . docusate sodium  200 mg Oral BID  . ezetimibe  10 mg Oral Daily  . fluconazole  100 mg Oral Daily  . furosemide  120 mg Intravenous BID  . Gerhardt's butt cream   Topical BID  . guaiFENesin  1,200 mg Oral BID  . hydrALAZINE  50 mg Oral BID  . imipenem-cilastatin  250 mg Intravenous Q6H  . insulin aspart  0-9 Units Subcutaneous TID WC  . insulin glargine  10 Units Subcutaneous QHS  . magnesium oxide  400 mg Oral Daily  . metoprolol succinate  25 mg Oral BID WC  . multivitamin  1 tablet Oral Daily  . pantoprazole  40 mg Oral Daily  . predniSONE  20 mg Oral Daily  . senna-docusate  1  tablet Oral QHS  . sodium chloride  3 mL Intravenous Q12H  . tiotropium  18 mcg Inhalation Daily  . valACYclovir  1,000 mg Oral TID    Objective: Vital signs in last 24 hours: Temp:  [97.4 F (36.3 C)-97.9 F (36.6 C)] 97.4 F (36.3 C) (06/25 2121) Pulse Rate:  [69-76] 76 (06/25 2121) Resp:  [20] 20 (06/25 2121) BP: (129-165)/(71-90) 165/71 mmHg (06/25 2121) SpO2:  [92 %-98 %] 92 % (06/25 2121) Weight:  [213 lb 13.5 oz (97 kg)] 213 lb 13.5 oz (97 kg) (06/25 0432) Constitutional: oriented to person, place, and time. He appears well-developed and well-nourished. No distress but has ongoing dry cough  HENT:  Mouth/Throat: Oropharynx is clear and moist. No oropharyngeal exudate.  Cardiovascular: Normal rate, regular rhythm and normal heart sounds. Exam reveals no gallop and no friction rub.  No murmur heard.  Pulmonary/Chest: Effort normal and breath sounds normal. No respiratory distress. He has no wheezes.  Abdominal: Soft. Bowel sounds are normal. He exhibits no distension. There is no tenderness.  Lymphadenopathy: no cervical adenopathy.  Neurological: He is alert and oriented to person, place, and time.  Skin: he has patch of small  clusters of papules to left buttocks with scattered lesions. Non are vesicular.  GU = groin and scrotum erythamatous plaque  Ext: erythema to plantar aspect of both feet Psychiatric: He has a normal mood and affect. His behavior is normal.     Lab Results  Recent Labs  09/08/12 0540 09/08/12 1425 09/09/12 0500  WBC 19.6*  --  19.3*  HGB 11.7*  --  11.5*  HCT 35.2*  --  33.8*  NA 139 135 135  K 3.7 4.2 3.6  CL 100 98 98  CO2 29 26 29   BUN 61* 62* 60*  CREATININE 2.13* 2.36* 2.28*    Microbiology: 6/17 resp culture: ESBL ecoli  6/12 blood cx NGTD  Studies/Results: Ct Head Wo Contrast  09/09/2012   *RADIOLOGY REPORT*  Clinical Data: Stroke.  The patient developed right-sided numbness and slurred speech last evening.  Remote history of  lung cancer.  CT HEAD WITHOUT CONTRAST  Technique:  Contiguous axial images were obtained from the base of the skull through the vertex without contrast.  Comparison: CT head without and with contrast 10/01/2004.  Findings: Mild generalized atrophy and white matter disease is present. There is slight progression white matter disease since the prior exam.  No acute cortical infarct, hemorrhage, or mass lesion is present.  Ventricles are proportionate to the degree of atrophy. No significant extra-axial fluid collection is present.  The paranasal sinuses and mastoid air cells are clear.  The osseous skull is intact.  IMPRESSION:  1.  Slight progression of atrophy and white matter disease, likely reflecting sequelae of chronic microvascular ischemia. 2.  No acute intracranial abnormality.   Original Report Authenticated By: Marin Roberts, [name]   Ct Chest Wo Contrast  09/08/2012   *RADIOLOGY REPORT*  Clinical Data: Hemoptysis.  Prior right upper lobectomy for non- small cell lung cancer with chemotherapy 2006.  CT CHEST WITHOUT CONTRAST  Technique:  Multidetector CT imaging of the chest was performed following the standard protocol without IV contrast.  Comparison: Several prior CT scans most recent 08/30/2012 and 02/05/2012  Findings: There is been minimal improvement in the pulmonary parenchymal changes involving both lungs asymmetric greater on the right.  What remains may represent result of infection/aspiration. Interstitial spread of tumor not entirely excluded although a secondary less likely consideration.  Follow-up until complete clearance is recommended given the patient's history.  5 mm left upper lobe nodule  new compared to 2013 examination. Tumor not excluded.  Top normal sized low pretracheal lymph node with short axis dimension of 9.3 mm.  Cardiomegaly with sequential pacemaker in place.  Prominent coronary artery calcifications.  No pericardial effusion.  Atherosclerotic type changes of the  aorta with ectasia similar to the prior exam.  Visualized upper abdominal structures without acute abnormality.  No bony destructive lesion.  Degenerative changes thoracic spine.  IMPRESSION: There is been minimal improvement in the pulmonary parenchymal changes involving both lungs asymmetric greater on the right.  What remains may represent result of infection/aspiration.  Interstitial spread of tumor not entirely excluded although a secondary less likely consideration.  Follow-up until complete clearance is recommended given the patient's history.  5 mm left upper lobe nodule  new compared to 2013 examination. Tumor not excluded.  Please see above.   Original Report Authenticated By: Lacy Duverney, [name]     Assessment/Plan: 79yo M with hx of lung ca, sever copd presents with hemoptysis being treated for ESBL ecoli pneumonia but also has shingle appear rash to buttocks, candidal skin  infection of groin and nonspecific macular papular rash to bottom of feet.  Rash to groin c/w candidal infection = continue with antifugal cream/powder BID  Buttock to rash c/w shingles = appears to improve with some crusting over, continue with valtrex 1gm TID x 7 days. No need for airborne isolation. Just contact, not disseminated   Rash to feet = unclear what this may be, will continue to monitor to see if any ointments or change in therapy is needed   esbl ecoli pna = treat for 8 days as HCAP, but may need to extend treatment to 10-14days if thought that he is not resolving from pneumonia.   Drue Second [facility] Cell: [phone] Pager: [phone]  09/09/2012, 10:22 PM

## 2012-09-09 NOTE — Progress Notes (Signed)
Inpatient Diabetes Program Recommendations  AACE/ADA: New Consensus Statement on Inpatient Glycemic Control (2013)  Target Ranges:  Prepandial:   less than 140 mg/dL      Peak postprandial:   less than 180 mg/dL (1-2 hours)      Critically ill patients:  140 - 180 mg/dL   Reason for Visit: Results for LISTON, THUM SR. (MRN [id number]) as of 09/09/2012 10:50  Ref. Range 09/08/2012 07:34 09/08/2012 12:45 09/08/2012 14:25 09/08/2012 17:12 09/08/2012 17:49 09/08/2012 21:34 09/09/2012 05:00  Glucose-Capillary Latest Range: 70-99 mg/dL 409 (H) 811 (H)  914 (H)  179 (H)    Please consider adding Novolog 3 units with lunch while on PO Prednisone.  Will follow.

## 2012-09-09 NOTE — Progress Notes (Signed)
Addendum  Patient seen and examined, chart and data base reviewed.  I agree with the above assessment and plan.  For full details please see Mrs. Algis Downs PA note.  Spoke with Dr. Graciela Husbands, we need to figure out intake and output prior to increasing the Lasix.  ESBL Escherichia coli pneumonia, on Primaxin day #6. No hemoptysis today.   Clint Lipps, MD [facility] Pager: [phone] 09/09/2012, 6:46 PM

## 2012-09-10 DIAGNOSIS — I5021 Acute systolic (congestive) heart failure: Secondary | ICD-10-CM

## 2012-09-10 DIAGNOSIS — J9620 Acute and chronic respiratory failure, unspecified whether with hypoxia or hypercapnia: Secondary | ICD-10-CM

## 2012-09-10 LAB — CBC
HCT: 34.8 % — ABNORMAL LOW (ref 39.0–52.0)
MCHC: 33.3 g/dL (ref 30.0–36.0)
MCV: 91.8 fL (ref 78.0–100.0)
RDW: 15.6 % — ABNORMAL HIGH (ref 11.5–15.5)

## 2012-09-10 LAB — BASIC METABOLIC PANEL
BUN: 63 mg/dL — ABNORMAL HIGH (ref 6–23)
CO2: 29 mEq/L (ref 19–32)
Chloride: 99 mEq/L (ref 96–112)
Creatinine, Ser: 2.3 mg/dL — ABNORMAL HIGH (ref 0.50–1.35)
Glucose, Bld: 133 mg/dL — ABNORMAL HIGH (ref 70–99)

## 2012-09-10 LAB — GLUCOSE, CAPILLARY
Glucose-Capillary: 297 mg/dL — ABNORMAL HIGH (ref 70–99)
Glucose-Capillary: 314 mg/dL — ABNORMAL HIGH (ref 70–99)

## 2012-09-10 MED ORDER — LIDOCAINE VISCOUS 2 % MT SOLN
15.0000 mL | Freq: Once | OROMUCOSAL | Status: DC
  Filled 2012-09-10: qty 15

## 2012-09-10 MED ORDER — MAGNESIUM CITRATE PO SOLN
1.0000 | Freq: Once | ORAL | Status: AC
  Administered 2012-09-10: 1 via ORAL
  Filled 2012-09-10: qty 296

## 2012-09-10 MED ORDER — ENSURE COMPLETE PO LIQD
237.0000 mL | ORAL | Status: DC
  Administered 2012-09-10 – 2012-09-13 (×3): 237 mL via ORAL

## 2012-09-10 MED ORDER — DEXTROSE 5 % IV SOLN
10.0000 mg/h | INTRAVENOUS | Status: DC
  Administered 2012-09-10: 20 mg/h via INTRAVENOUS
  Administered 2012-09-11: 10 mg/h via INTRAVENOUS
  Filled 2012-09-10 (×3): qty 25

## 2012-09-10 NOTE — Progress Notes (Signed)
Addendum  Patient seen and examined, chart and data base reviewed.  I agree with the above assessment and plan.  For full details please see Mrs. Algis Downs PA note.  Still have some hemoptysis, continue Primaxin for ESBL E coli.   Clint Lipps, MD [facility] Pager: [phone] 09/10/2012, 4:20 PM

## 2012-09-10 NOTE — Consult Note (Signed)
Reason for Consult: Hemoptysis Referring Physician: Maretta Bees, MD  [name] Newcomer Sr. is an 79 y.o. male.  HPI: History of lung carcinoma in the past. Admitted for pneumonia couple of weeks ago. Since a bronchoscopy he's been coughing up small amounts of blood. Prior to that he was not. He denies any throat symptoms specifically. He denies any nosebleeds.  Past Medical History  Diagnosis Date  . Stricture and stenosis of esophagus 12/07/2004    EGD done on 12/07/2004  . GERD (gastroesophageal reflux disease)   . Hypoxemia     With chronic respiratory failure  . Increased prostate specific antigen (PSA) velocity 02/26/2011  . DM (diabetes mellitus) 02/26/2011  . Gout 02/26/2011  . COPD (chronic obstructive pulmonary disease)     oxygen at home, 4L  . OSA (obstructive sleep apnea) 02/26/2011    CPAP  . Paroxysmal atrial fibrillation   . Depression   . Cardiac arrest 2009    while in hospital   . Hypertension   . HTN (hypertension) 02/26/2011  . Cardiac arrest 2009    while in hospital  . Lung cancer     lung s/p lobectomy x2 on RT lung  . Basal cell carcinoma of nose 02/26/2011  . ETOH abuse 01/29/2012    started drinking again - hx cardiac arrest in 2009 due to dt's with organ failure  . CKD (chronic kidney disease) 02/28/2012  . Chronic diastolic CHF (congestive heart failure)     a. EF 55-60% by echo 2013.  . Sick sinus syndrome     a. s/p [employer] pacemaker 2010.  . Bradycardia     a. Admitted 2010 with severe bradycardia, runs of paroxysmal VT.   Marland Kitchen Paroxysmal VT     a. In 2010 in setting of marked bradycardia.  Marland Kitchen CAD (coronary artery disease)     a. Nonobstructive CAD by cath 2006.  Marland Kitchen Esophageal ulcer     a. By EGD 10/2011.  Marland Kitchen Hiatal hernia     a. By EGD 10/2011.  . Valvular heart disease     a. Mild MR/mild AI/mild AS by echo 05/2011.    Past Surgical History  Procedure Laterality Date  . Lung lobectomy      x2 lobes on RT lung  . Knee arthroscopy       bilateral  . Pace maker    . Tonsillectomy    . Hernia repair      x2, esophageal  . Cardiac catheterization    . Esophagogastroduodenoscopy  10/25/2011    Procedure: ESOPHAGOGASTRODUODENOSCOPY (EGD);  Surgeon: Hart Carwin, MD;  Location: [facility];  Service: Endoscopy;  Laterality: N/A;  . Video bronchoscopy N/A 09/01/2012    Procedure: VIDEO BRONCHOSCOPY WITHOUT FLUORO;  Surgeon: Storm Frisk, MD;  Location: [facility] ENDOSCOPY;  Service: Cardiopulmonary;  Laterality: N/A;    Family History  Problem Relation Age of Onset  . Heart disease Father   . Lung cancer Mother   . Cancer Other     lung cancer  . Heart disease Other   . Hypertension Other     Social History:  reports that he quit smoking about 16 years ago. His smoking use included Cigarettes. He has a 75 pack-year smoking history. His smokeless tobacco use includes Chew. He reports that  drinks alcohol. He reports that he does not use illicit drugs.  Allergies: No Known Allergies  Medications: Reviewed  Results for orders placed during the hospital encounter of 08/27/12 (from the past  48 hour(s))  GLUCOSE, CAPILLARY     Status: Abnormal   Collection Time    09/08/12  9:34 PM      Result Value Range   Glucose-Capillary 179 (*) 70 - 99 mg/dL  BASIC METABOLIC PANEL     Status: Abnormal   Collection Time    09/09/12  5:00 AM      Result Value Range   Sodium 135  135 - 145 mEq/L   Potassium 3.6  3.5 - 5.1 mEq/L   Chloride 98  96 - 112 mEq/L   CO2 29  19 - 32 mEq/L   Glucose, Bld 134 (*) 70 - 99 mg/dL   BUN 60 (*) 6 - 23 mg/dL   Creatinine, Ser 1.61 (*) 0.50 - 1.35 mg/dL   Calcium 8.2 (*) 8.4 - 10.5 mg/dL   GFR calc non Af Amer 26 (*) >90 mL/min   GFR calc Af Amer 30 (*) >90 mL/min   Comment:            The eGFR has been calculated     using the CKD EPI equation.     This calculation has not been     validated in all clinical     situations.     eGFR's persistently     <90 mL/min signify     possible  Chronic Kidney Disease.  CBC     Status: Abnormal   Collection Time    09/09/12  5:00 AM      Result Value Range   WBC 19.3 (*) 4.0 - 10.5 K/uL   RBC 3.71 (*) 4.22 - 5.81 MIL/uL   Hemoglobin 11.5 (*) 13.0 - 17.0 g/dL   HCT 09.6 (*) 04.5 - 40.9 %   MCV 91.1  78.0 - 100.0 fL   MCH 31.0  26.0 - 34.0 pg   MCHC 34.0  30.0 - 36.0 g/dL   RDW 81.1  91.4 - 78.2 %   Platelets 347  150 - 400 K/uL  DIFFERENTIAL     Status: Abnormal   Collection Time    09/09/12  5:00 AM      Result Value Range   Neutrophils Relative % 87 (*) 43 - 77 %   Neutro Abs 16.8 (*) 1.7 - 7.7 K/uL   Lymphocytes Relative 8 (*) 12 - 46 %   Lymphs Abs 1.5  0.7 - 4.0 K/uL   Monocytes Relative 5  3 - 12 %   Monocytes Absolute 0.9  0.1 - 1.0 K/uL   Eosinophils Relative 0  0 - 5 %   Eosinophils Absolute 0.0  0.0 - 0.7 K/uL   Basophils Relative 0  0 - 1 %   Basophils Absolute 0.0  0.0 - 0.1 K/uL  GLUCOSE, CAPILLARY     Status: Abnormal   Collection Time    09/09/12  7:52 AM      Result Value Range   Glucose-Capillary 104 (*) 70 - 99 mg/dL  GLUCOSE, CAPILLARY     Status: Abnormal   Collection Time    09/09/12  1:15 PM      Result Value Range   Glucose-Capillary 132 (*) 70 - 99 mg/dL  GLUCOSE, CAPILLARY     Status: Abnormal   Collection Time    09/09/12  5:19 PM      Result Value Range   Glucose-Capillary 279 (*) 70 - 99 mg/dL  GLUCOSE, CAPILLARY     Status: Abnormal   Collection Time    09/09/12  9:46 PM      Result Value Range   Glucose-Capillary 258 (*) 70 - 99 mg/dL  BASIC METABOLIC PANEL     Status: Abnormal   Collection Time    09/10/12  5:43 AM      Result Value Range   Sodium 136  135 - 145 mEq/L   Potassium 3.5  3.5 - 5.1 mEq/L   Chloride 99  96 - 112 mEq/L   CO2 29  19 - 32 mEq/L   Glucose, Bld 133 (*) 70 - 99 mg/dL   BUN 63 (*) 6 - 23 mg/dL   Creatinine, Ser 1.61 (*) 0.50 - 1.35 mg/dL   Calcium 8.2 (*) 8.4 - 10.5 mg/dL   GFR calc non Af Amer 26 (*) >90 mL/min   GFR calc Af Amer 30 (*) >90  mL/min   Comment:            The eGFR has been calculated     using the CKD EPI equation.     This calculation has not been     validated in all clinical     situations.     eGFR's persistently     <90 mL/min signify     possible Chronic Kidney Disease.  CBC     Status: Abnormal   Collection Time    09/10/12  5:43 AM      Result Value Range   WBC 20.9 (*) 4.0 - 10.5 K/uL   RBC 3.79 (*) 4.22 - 5.81 MIL/uL   Hemoglobin 11.6 (*) 13.0 - 17.0 g/dL   HCT 09.6 (*) 04.5 - 40.9 %   MCV 91.8  78.0 - 100.0 fL   MCH 30.6  26.0 - 34.0 pg   MCHC 33.3  30.0 - 36.0 g/dL   RDW 81.1 (*) 91.4 - 78.2 %   Platelets 353  150 - 400 K/uL  GLUCOSE, CAPILLARY     Status: Abnormal   Collection Time    09/10/12  7:48 AM      Result Value Range   Glucose-Capillary 111 (*) 70 - 99 mg/dL  GLUCOSE, CAPILLARY     Status: Abnormal   Collection Time    09/10/12 12:09 PM      Result Value Range   Glucose-Capillary 165 (*) 70 - 99 mg/dL    Ct Head Wo Contrast  09/09/2012   *RADIOLOGY REPORT*  Clinical Data: Stroke.  The patient developed right-sided numbness and slurred speech last evening.  Remote history of lung cancer.  CT HEAD WITHOUT CONTRAST  Technique:  Contiguous axial images were obtained from the base of the skull through the vertex without contrast.  Comparison: CT head without and with contrast 10/01/2004.  Findings: Mild generalized atrophy and white matter disease is present. There is slight progression white matter disease since the prior exam.  No acute cortical infarct, hemorrhage, or mass lesion is present.  Ventricles are proportionate to the degree of atrophy. No significant extra-axial fluid collection is present.  The paranasal sinuses and mastoid air cells are clear.  The osseous skull is intact.  IMPRESSION:  1.  Slight progression of atrophy and white matter disease, likely reflecting sequelae of chronic microvascular ischemia. 2.  No acute intracranial abnormality.   Original Report  Authenticated By: Marin Roberts, [name]   Ct Chest Wo Contrast  09/08/2012   *RADIOLOGY REPORT*  Clinical Data: Hemoptysis.  Prior right upper lobectomy for non- small cell lung cancer with chemotherapy 2006.  CT CHEST WITHOUT CONTRAST  Technique:  Multidetector CT imaging of the chest was performed following the standard protocol without IV contrast.  Comparison: Several prior CT scans most recent 08/30/2012 and 02/05/2012  Findings: There is been minimal improvement in the pulmonary parenchymal changes involving both lungs asymmetric greater on the right.  What remains may represent result of infection/aspiration. Interstitial spread of tumor not entirely excluded although a secondary less likely consideration.  Follow-up until complete clearance is recommended given the patient's history.  5 mm left upper lobe nodule  new compared to 2013 examination. Tumor not excluded.  Top normal sized low pretracheal lymph node with short axis dimension of 9.3 mm.  Cardiomegaly with sequential pacemaker in place.  Prominent coronary artery calcifications.  No pericardial effusion.  Atherosclerotic type changes of the aorta with ectasia similar to the prior exam.  Visualized upper abdominal structures without acute abnormality.  No bony destructive lesion.  Degenerative changes thoracic spine.  IMPRESSION: There is been minimal improvement in the pulmonary parenchymal changes involving both lungs asymmetric greater on the right.  What remains may represent result of infection/aspiration.  Interstitial spread of tumor not entirely excluded although a secondary less likely consideration.  Follow-up until complete clearance is recommended given the patient's history.  5 mm left upper lobe nodule  new compared to 2013 examination. Tumor not excluded.  Please see above.   Original Report Authenticated By: Lacy Duverney, [name]    BJY:[id number] except as listed in admit H&P  Blood pressure 128/83, pulse 77, temperature 97.8  F (36.6 C), temperature source Axillary, resp. rate 18, height 5\' 7"  (1.702 m), weight 205 lb 14.6 oz (93.4 kg), SpO2 100.00%.  PHYSICAL EXAM: Overall appearance:  Elderly gentleman, in no distress Head:  Normocephalic, atraumatic. Ears: External ears look normal. Nose: External nose is healthy in appearance. Internal nasal exam free of any lesions or obstruction. Oral Cavity:  There are no mucosal lesions or masses identified. Oral Pharynx/Hypopharynx/Larynx: no signs of any mucosal lesions or masses identified.  Neuro:  No identifiable neurologic deficits. Neck: No palpable neck masses.  Studies Reviewed: none  Procedures: Flexible fiberoptic laryngoscopy was performed. The nasal cavities, nasopharynx, oropharynx, hypopharynx, larynx are clear and healthy without any evidence of neoplasm, infection, or any obvious bleeding site.   Assessment/Plan: No pathologic findings in the upper aerodigestive tract. I suspect the hemoptysis is coming from the lower airways and possibly related to the bronchoscopy/pneumonia. Contact me if any further workup is needed by me.  Christia Coaxum 09/10/2012, 5:15 PM

## 2012-09-10 NOTE — Progress Notes (Signed)
Occupational Therapy Treatment Patient Details Name: [name] HILLOCK Sr. MRN: [id number] DOB: 04/02/1932 Today's Date: 09/10/2012 Time: 4098-1191 OT Time Calculation (min): 35 min  OT Assessment / Plan / Recommendation  OT comments  Pt and wife have been instructed in energy conservation and ADL.  Pt is performing at his baseline in ADL and wife is knowledgeable in assisting him.. All DME needs are met.  No further acute OT needs.  Recommend pt continue to participate as he is able in self care activities.  Follow Up Recommendations  No OT follow up;Supervision/Assistance - 24 hour    Barriers to Discharge       Equipment Recommendations  None recommended by OT    Recommendations for Other Services    Frequency Min 2X/week   Progress towards OT Goals Progress towards OT goals: Goals met/education completed, patient discharged from PT  Plan Discharge plan remains appropriate    Precautions / Restrictions Precautions Precautions: Fall Precaution Comments: O2 dependent Restrictions Weight Bearing Restrictions: No   Pertinent Vitals/Pain 4L 02 throughout activity, no pain    ADL  Grooming: Shaving;Set up Occupational psychologist) Where Assessed - Grooming: Unsupported sitting Upper Body Dressing: Set up Where Assessed - Upper Body Dressing: Unsupported sitting Lower Body Dressing: Supervision/safety (slippers) Where Assessed - Lower Body Dressing: Unsupported sitting Toilet Transfer: Min Pension scheme manager Method: Sit to Barista: Extra wide bedside commode Toileting - Clothing Manipulation and Hygiene: Min guard Equipment Used: Rolling walker (02) Transfers/Ambulation Related to ADLs: transferred to 3 in1 with min guard assist with RW ADL Comments: Educated pt in purse lip breathing.  Has bee instructed in the past, but forgets to implement. Wife assists pt with LB dressing due to low endurance and tendency for SOB with bending toward feet. Prefers to assist  pt rather than using AE which he has at home.    OT Diagnosis:    OT Problem List:   OT Treatment Interventions:     OT Goals(current goals can now be found in the care plan section)    Visit Information  Last OT Received On: 09/10/12 Assistance Needed: +1 History of Present Illness: acute on chronic respiratory failure, CHF    Subjective Data      Prior Functioning       Cognition  Cognition Arousal/Alertness: Awake/alert Behavior During Therapy: WFL for tasks assessed/performed Overall Cognitive Status: Within Functional Limits for tasks assessed    Mobility  Bed Mobility Bed Mobility: Supine to Sit;Sit to Supine Supine to Sit: 6: Modified independent (Device/Increase time);HOB elevated Sit to Supine: 6: Modified independent (Device/Increase time);HOB elevated Transfers Transfers: Sit to Stand;Stand to Sit Sit to Stand: 5: Supervision;With upper extremity assist;From bed;From chair/3-in-1 Stand to Sit: 5: Supervision;With upper extremity assist;To chair/3-in-1;To bed Details for Transfer Assistance: Pt needs min instructional cueing for hand placement with sit to stand.    Exercises      Balance Static Standing Balance Static Standing - Balance Support: Right upper extremity supported;Left upper extremity supported Static Standing - Level of Assistance: 5: Stand by assistance   End of Session OT - End of Session Activity Tolerance: Patient limited by fatigue Patient left: in bed;with family/visitor present;with call bell/phone within reach  GO     Evern Bio 09/10/2012, 2:33 PM [phone]

## 2012-09-10 NOTE — Progress Notes (Signed)
PATIENT DETAILS Name: [name] VESELKA Sr. Age: 79 y.o. Sex: male Date of Birth: [birth year] Admit Date: 08/27/2012 Admitting Physician Clydia Llano, MD YNW:GNFAO [name], MD  Subjective: Skin rash appears to be spreading in distal lower extremities and across to opposite buttock.  Still with hemoptysis both clots in cup and suction tank.  Assessment/Plan: Acute on CHRONIC OBSTRUCTIVE PULMONARY DISEASE, SEVERE - Continued slow going - patient progress appears to have stalled. - Lung sounds are clearing.  Continues on 4L of oxygen via n/c - Lasix 120 mg bid - strict Is and Os. These are now being tracked correctly.  - Cont supportive care with nebs/MDI  - prednisone decreased to 20 mg on 6/22.  slow taper  Acute on chronic diastolic heart failure - Cardiology following and managing diuresis - Continue with Lasix 120 mg bid - strict Is&Os - these are being tracked accurately as of 6/24. - Creatinine relatively stable (2.3) - Do not lie flat, keep head of bed raised. - 2 gm sodium diet, 1200 ml fluid restriction. -Curb sided Dr. Gala Romney (6/26) who recommended lasix drip 20 mg/hour for 24 to 48 hours.  If creatinine bumps up significantly (3.0) will pull back.  He also recommended wrapping the patient's extremities with Ace wrap or Una boots.  Compression will be import to control his edema.  Hemoptysis -Etiology? Recurrence of underlying malignancy vs PNA -Still with clots in the cup and blood mixed with water in the suction tank. -Hgb stable (approx 11.6) -Status post a bronchoscopy 09/01/12 with no endobronchial lesions seen, only old clot seen on bronchoscopy -Consulted ENT (Dr. [name]) 6/26 to completely rule out epistaxis or other source of bleeding.  PNA -BAL positive for ESBL E Coli -completed 9 days of levaquin-stop Levaquin, now on Primaxin-Day 7 -Appreciate ID consult.  Dr. Drue Second recommends 10 - 14 days of antibiotic treatment.(antibiotics end 6/30 - 7/3) -clinically  improving.  Rash on buttocks and scrotum  - uncertain ?Shingles -Valtrex started 6/23 -Now spread across both buttocks and similar lesions in the lower leg particularly on the left. -Supportive skin care -Will attempt to contact the patient's dermatologist (6/26), [facility] - Dr. [place], to see if she will see him inpatient.  Yeast in groin area -improving -diflucan orally -Topical (Gerhardt's butt) cream to affected area  Atrial fibrillation - Unfortunately off anti-coagulation given hemoptysis, per Dr. [name] ok to start Epixiban 2-3 days after hemoptysis resolves. -On metoprolol and Cardizem for rate control -Tolerating Amiodarone well  Dysarthia (6/25) -Resolved.  Likely the result of gabapentin initiated 6/24. D/c'd 6/25. -Neuro consulted.  CT head negative for stroke.  CKD (chronic kidney disease), stage III  - Creatinine up slightly approx 2.3. Monitor renal function closely.  Leukocytosis - WBC elevated but stable - Multifactorial from possible pneumonia, on steroids - Monitor  Hypertension - Controlled- continue with current medication  Diabetes - Lantus at 10 units, continue with SSI-s  CORONARY ARTERY DISEASE (nonobstructive disease 2006 catheterization)/Elevation of cardiac enzymes  -no CP and likely due to acute HF, demand ischemia from recent hypoxia and worsened renal function - Cardiology following  Cellulitis of left ankle  -has decreased rednessboth legs c/w stasis dermatitis- resolved   NSCLC s/p RULectomy and adjuvant chemo in 2006 - Does have a new lung nodule-will need close followup as an outpatient  Disposition: Remain inpatient.    DVT Prophylaxis:  SCD's  Code Status: Full code  Family Communication Spouse at bedside  Procedures:  None  CONSULTS: Cardiology Pulmonology Infectious disease.  Ear Nose and throat Oncology Dermatology   MEDICATIONS: Scheduled Meds: . amiodarone  400 mg Oral BID  . arformoterol   15 mcg Nebulization BID  . atorvastatin  40 mg Oral QHS  . barrier cream  1 application Topical BID  . citalopram  10 mg Oral Daily  . diltiazem  180 mg Oral BID  . diphenhydrAMINE  25 mg Oral Q6H  . docusate sodium  200 mg Oral BID  . ezetimibe  10 mg Oral Daily  . fluconazole  100 mg Oral Daily  . furosemide  120 mg Intravenous BID  . Gerhardt's butt cream   Topical BID  . guaiFENesin  1,200 mg Oral BID  . hydrALAZINE  50 mg Oral BID  . imipenem-cilastatin  250 mg Intravenous Q6H  . insulin aspart  0-9 Units Subcutaneous TID WC  . insulin glargine  10 Units Subcutaneous QHS  . magnesium citrate  1 Bottle Oral Once  . magnesium oxide  400 mg Oral Daily  . metoprolol succinate  25 mg Oral BID WC  . multivitamin  1 tablet Oral Daily  . pantoprazole  40 mg Oral Daily  . predniSONE  20 mg Oral Daily  . senna-docusate  1 tablet Oral QHS  . sodium chloride  3 mL Intravenous Q12H  . tiotropium  18 mcg Inhalation Daily  . valACYclovir  1,000 mg Oral TID   Continuous Infusions:  PRN Meds:.ALPRAZolam, Glycerin (Adult), levalbuterol, morphine injection, nitroGLYCERIN, ondansetron (ZOFRAN) IV, ondansetron, oxyCODONE, polyvinyl alcohol, sodium chloride, traZODone  Antibiotics: Anti-infectives   Start     Dose/Rate Route Frequency Ordered Stop   09/08/12 1100  fluconazole (DIFLUCAN) tablet 100 mg     100 mg Oral Daily 09/08/12 1013     09/07/12 1600  valACYclovir (VALTREX) tablet 1,000 mg     1,000 mg Oral 3 times daily 09/07/12 1303 09/14/12 1559   09/04/12 1200  imipenem-cilastatin (PRIMAXIN) 250 mg in sodium chloride 0.9 % 100 mL IVPB     250 mg 200 mL/hr over 30 Minutes Intravenous 4 times per day 09/04/12 0856     08/29/12 1200  levofloxacin (LEVAQUIN) tablet 500 mg  Status:  Discontinued     500 mg Oral Every 48 hours 08/29/12 0957 09/04/12 1008   08/27/12 0900  vancomycin (VANCOCIN) 1,250 mg in sodium chloride 0.9 % 250 mL IVPB  Status:  Discontinued     1,250 mg 166.7 mL/hr  over 90 Minutes Intravenous Every 24 hours 08/27/12 0826 08/28/12 1809   08/27/12 0900  piperacillin-tazobactam (ZOSYN) IVPB 3.375 g  Status:  Discontinued     3.375 g 12.5 mL/hr over 240 Minutes Intravenous 3 times per day 08/27/12 0826 08/28/12 1809   08/27/12 0600  levofloxacin (LEVAQUIN) IVPB 500 mg     500 mg 100 mL/hr over 60 Minutes Intravenous  Once 08/27/12 0554 08/27/12 0735       PHYSICAL EXAM: Vital signs in last 24 hours: Filed Vitals:   09/10/12 0500 09/10/12 0533 09/10/12 0831 09/10/12 0839  BP:  132/81 137/90   Pulse:  81 77   Temp:  97.8 F (36.6 C)    TempSrc:  Axillary    Resp:  18    Height:      Weight: 93.4 kg (205 lb 14.6 oz)     SpO2:  92%  100%    Weight change: -1.4 kg (-3 lb 1.4 oz) Filed Weights   09/08/12 0711 09/09/12 0432 09/10/12 0500  Weight: 94.8 kg (208 lb 15.9  oz) 97 kg (213 lb 13.5 oz) 93.4 kg (205 lb 14.6 oz)   Body mass index is 32.24 kg/(m^2).   Gen Exam: Awake and alert, speech cleared today.  Very pleasant Neck: Supple, No JVD.   Chest: minimal crackles, good air movement. CVS: S1 S2 Regular, no murmurs.  Abdomen: soft, BS +, non tender, non distended.  Extremities 2-3+ edema on dorsum of feet bilaterally, lower extremities warm to touch.  Neurologic:  No acute changes, Non focal Skin: multiple areas of rash.  Erythematous scale across his buttocks and scrotum.  Similar appearing rash on his distal lower extremities.   Plantar portion of feet are decreasing in redness (confluent erythema), and groin is red as well w/o scale - shows improvement over 6/25.   Intake/Output from previous day:  Intake/Output Summary (Last 24 hours) at 09/10/12 0955 Last data filed at 09/10/12 0950  Gross per 24 hour  Intake   1410 ml  Output   2800 ml  Net  -1390 ml     LAB RESULTS: CBC  Recent Labs Lab 09/05/12 0500 09/07/12 0640 09/08/12 0540 09/09/12 0500 09/10/12 0543  WBC 19.0* 21.4* 19.6* 19.3* 20.9*  HGB 12.4* 11.0* 11.7*  11.5* 11.6*  HCT 36.1* 33.2* 35.2* 33.8* 34.8*  PLT 386 337 360 347 353  MCV 91.9 93.0 92.9 91.1 91.8  MCH 31.6 30.8 30.9 31.0 30.6  MCHC 34.3 33.1 33.2 34.0 33.3  RDW 15.2 15.4 15.5 15.5 15.6*  LYMPHSABS 1.4  --   --  1.5  --   MONOABS 1.3*  --   --  0.9  --   EOSABS 0.0  --   --  0.0  --   BASOSABS 0.0  --   --  0.0  --     Chemistries   Recent Labs Lab 09/07/12 0640 09/08/12 0540 09/08/12 1425 09/09/12 0500 09/10/12 0543  NA 139 139 135 135 136  K 3.3* 3.7 4.2 3.6 3.5  CL 102 100 98 98 99  CO2 28 29 26 29 29   GLUCOSE 184* 127* 336* 134* 133*  BUN 64* 61* 62* 60* 63*  CREATININE 2.23* 2.13* 2.36* 2.28* 2.30*  CALCIUM 8.0* 8.1* 7.9* 8.2* 8.2*    CBG:  Recent Labs Lab 09/09/12 0752 09/09/12 1315 09/09/12 1719 09/09/12 2146 09/10/12 0748  GLUCAP 104* 132* 279* 258* 111*    Urine Studies MICROBIOLOGY: Recent Results (from the past 240 hour(s))  CULTURE, EXPECTORATED SPUTUM-ASSESSMENT     Status: None   Collection Time    08/31/12  1:10 PM      Result Value Range Status   Specimen Description SPUTUM   Final   Special Requests NONE   Final   Sputum evaluation     Final   Value: THIS SPECIMEN IS ACCEPTABLE. RESPIRATORY CULTURE REPORT TO FOLLOW.   Report Status 08/31/2012 FINAL   Final  CULTURE, RESPIRATORY (NON-EXPECTORATED)     Status: None   Collection Time    08/31/12  1:10 PM      Result Value Range Status   Specimen Description SPUTUM   Final   Special Requests NONE   Final   Gram Stain     Final   Value: RARE WBC PRESENT, PREDOMINANTLY PMN     RARE SQUAMOUS EPITHELIAL CELLS PRESENT     NO ORGANISMS SEEN   Culture NORMAL OROPHARYNGEAL FLORA   Final   Report Status 09/02/2012 FINAL   Final  AFB CULTURE WITH SMEAR     Status: None  Collection Time    09/01/12 11:19 AM      Result Value Range Status   Specimen Description BRONCHIAL WASHINGS   Final   Special Requests NONE   Final   ACID FAST SMEAR NO ACID FAST BACILLI SEEN   Final   Culture      Final   Value: CULTURE WILL BE EXAMINED FOR 6 WEEKS BEFORE ISSUING A FINAL REPORT   Report Status PENDING   Incomplete  FUNGUS CULTURE W SMEAR     Status: None   Collection Time    09/01/12 11:19 AM      Result Value Range Status   Specimen Description BRONCHIAL WASHINGS   Final   Special Requests NONE   Final   Fungal Smear FEW HYPHAL ELEMENTS   Final   Culture CANDIDA ALBICANS   Final   Report Status PENDING   Incomplete  LEGIONELLA CULTURE     Status: None   Collection Time    09/01/12 11:19 AM      Result Value Range Status   Specimen Description BRONCHIAL WASHINGS   Final   Special Requests NONE   Final   Culture NO LEGIONELLA ISOLATED   Final   Report Status 09/06/2012 FINAL   Final  CULTURE, RESPIRATORY (NON-EXPECTORATED)     Status: None   Collection Time    09/01/12 11:19 AM      Result Value Range Status   Specimen Description BRONCHIAL WASHINGS   Final   Special Requests NONE   Final   Gram Stain     Final   Value: RARE WBC PRESENT, PREDOMINANTLY PMN     RARE SQUAMOUS EPITHELIAL CELLS PRESENT     RARE YEAST     RARE GRAM NEGATIVE RODS   Culture     Final   Value: FEW ESCHERICHIA COLI     Note: Confirmed Extended Spectrum Beta-Lactamase Producer (ESBL) CRITICAL RESULT CALLED TO, READ BACK BY AND VERIFIED WITH: JENNIFER Z@7 :45AM ON 09/04/12 BY DANTS     FEW CANDIDA ALBICANS   Report Status 09/04/2012 FINAL   Final   Organism ID, Bacteria ESCHERICHIA COLI   Final    RADIOLOGY STUDIES/RESULTS: Dg Chest 2 View  09/01/2012   *RADIOLOGY REPORT*  Clinical Data: Shortness of breath.  CHEST - 2 VIEW  Comparison: 08/31/2012.  Findings: The left subclavian Port-A-Cath tip it is most likely in the mid SVC at the level of the carina.  Stable pacer wires.  The heart is enlarged but unchanged.  Persistent bilateral infiltrates and effusions.  IMPRESSION:  1.  The Port-A-Cath tip appears to be in the mid SVC. 2.  Persistent bilateral infiltrates and small effusions.   Original  Report Authenticated By: Rudie Meyer, [name]   Ct Chest Wo Contrast  08/30/2012   *RADIOLOGY REPORT*  Clinical Data: Hemoptysis.  Pneumonia versus edema.  CT CHEST WITHOUT CONTRAST  Technique:  Multidetector CT imaging of the chest was performed following the standard protocol without IV contrast.  Comparison: Chest radiograph 08/30/2012 and chest CT 02/05/2012  Findings: Heart is enlarged.  Cardiomegaly appears stable. Stable prominent fat density, consistent with lipoma, between the left and right atria.  Dual lead pacer is present.  There is heavy coronary artery atherosclerotic calcification of all three coronary arteries.  Aortic valvular calcifications are present.  A left IJ central venous catheter terminates in the superior vena cava. Thyroid gland and thoracic inlet appear within normal limits.  Heavy atherosclerotic calcification of the thoracic aorta, without aneurysm.  Several mediastinal lymph nodes are visualized, and within normal limits for size.  No pathologically enlarged lymph nodes are seen in the thorax.  Evaluation for the hilar lymph nodes is somewhat limited without intravenous contrast.  Small bilateral pleural effusions are present.  Multilevel degenerative changes of the thoracic spine.  Multiple benign hemangiomas in the thoracic spine vertebral bodies.  No acute or suspicious osseous abnormality.  No acute findings are identified in the abdomen.  Surgical clips are seen in the left upper quadrant.  There are postsurgical changes of the right lower lobectomy.  There is a background of centrilobular emphysema.  Stable scarring in the right upper lobe anteriorly.  No definite intralobular septal thickening.  There is markedly asymmetric airspace disease in the inferior and posterior aspect of the right upper lobe, where there are areas of consolidation.  There are ground-glass opacities which are new compared to prior chest CT in the left upper lobe, left lower lobe, and right middle  lobe.  5 mm pulmonary nodule in the left upper lobe peripherally on image #27 of the lung windows is new.  IMPRESSION:  1.  Markedly asymmetric bilateral airspace disease.  Airspace disease is most prominent, with areas of consolidation, in the posterior and inferior right upper lobe ( the patient has a history of right lower lobectomy).  Findings in the right upper lobe are favored to be due to pneumonia or aspiration. 2.  New ground-glass opacities in the left upper lobe and left lower lobe may be due to early infection.  Underlying mild pulmonary edema cannot be excluded in this patient with cardiomegaly and small bilateral pleural effusions. 3.  New 5 mm pulmonary nodule left upper lobe. If the patient is at high risk for bronchogenic carcinoma, follow-up chest CT at 6-12 months is recommended.  If the patient is at low risk for bronchogenic carcinoma, follow-up chest CT at 12 months is recommended.  This recommendation follows the consensus statement: Guidelines for Management of Small Pulmonary Nodules Detected on CT Scans: A Statement from the Fleischner Society as published in Radiology 2005; 237:395-400. 4.  Cardiomegaly with three-vessel coronary artery disease and dual lead cardiac pacer. 5.  Small bilateral pleural effusions 6.  Aortic valve calcifications. This finding raises the possibility of aortic valvular stenosis.  .   Original Report Authenticated By: Britta Mccreedy, [name]   Dg Chest Port 1 View  08/31/2012   *RADIOLOGY REPORT*  Clinical Data: Follow-up edema  PORTABLE CHEST - 1 VIEW  Comparison: Yesterday  Findings: The tip of the left subclavian Port-A-Cath has changed its orientation.  Based on a single view, it is likely in the azygos vein.  Right subclavian dual lead pacemaker device and leads are stable and intact.  Bilateral pleural effusions left greater than right are stable.  Diffuse edema is stable. Cardiomegaly.  IMPRESSION: Left subclavian Port-A-Cath tip has changed its position and  is likely in the azygos vein.  This can be confirmed with a lateral view.  Stable airspace disease and pleural effusions.   Original Report Authenticated By: Jolaine Click, [name]   Dg Chest Port 1 View  08/30/2012   *RADIOLOGY REPORT*  Clinical Data: Respiratory failure.  PORTABLE CHEST - 1 VIEW  Comparison: 08/29/2012 and 08/27/2012  Findings: Prominent interstitial markings could represent underlying edema.  There are persistent interstitial and airspace densities in the right mid and lower lung region.  Heart size remains enlarged.  Again noted is a right dual lead cardiac pacemaker.  Left subclavian  Port-A-Cath in the SVC region.  Aortic arch is heavily calcified.  IMPRESSION:  Prominent interstitial markings may represent underlying edema. Again noted are increased densities in the right mid and lower lung region which could represent asymmetric edema but cannot exclude an infectious etiology.   Original Report Authenticated By: Richarda Overlie, [name]   Dg Chest Port 1 View  08/29/2012   *RADIOLOGY REPORT*  Clinical Data: Follow-up CHF  PORTABLE CHEST - 1 VIEW  Comparison: Prior chest x-ray 08/27/2012  Findings: Stable position of left subclavian central venous catheter with the tip in the mid superior vena cava.  Right subclavian approach cardiac rhythm maintenance device with leads in the right atrium and right ventricle.  Slightly improved inspiratory volumes with clearing in the right base.  Stable elevation of the left hemidiaphragm resulting in blunting of the left costophrenic angle.  Decreasing interstitial edema with the residual asymmetric patchy opacity in the right mid and lower lobe. Small residual right pleural effusion.  Stable cardiomegaly. Aortic atherosclerosis again noted.  No pneumothorax.  IMPRESSION:  1.  Decreasing pulmonary edema with improved inspiratory volumes and clearing in the right base. 2.  Residual asymmetric patchy opacity in the right mid and lower lung may reflect residual  asymmetric alveolar edema, or pneumonia. 3.  Persistent small right pleural effusion, chronic elevation of the left hemidiaphragm and cardiomegaly.   Original Report Authenticated By: Malachy Moan, [name]   Dg Chest Portable 1 View  08/27/2012   *RADIOLOGY REPORT*  Clinical Data: Shortness of breath.  History of lung cancer post lobectomy.  PORTABLE CHEST - 1 VIEW  Comparison: 02/02/2012  Findings: Shallow inspiration.  Cardiac enlargement with pulmonary vascular congestion and perihilar edema.  Bilateral pleural effusions.  Atelectasis in the lung bases.  No pneumothorax. Changes have developed since the previous study.  Postoperative changes in the right chest.  Stable appearance of cardiac pacemaker and left central venous catheter.  Calcified and tortuous aorta.  IMPRESSION: Interval development of cardiac enlargement, pulmonary vascular congestion, perihilar edema, and bilateral pleural effusions.   Original Report Authenticated By: Burman Nieves, [name]    [facility] Pager:[phone]  If 7PM-7AM, please contact night-coverage [email or web address] Password [facility] 09/10/2012, 9:55 AM   LOS: 14 days

## 2012-09-10 NOTE — Progress Notes (Signed)
Physical Therapy Treatment Patient Details Name: [name] DETIENNE Sr. MRN: [id number] DOB: [birth year] Today's Date: 09/10/2012 Time: 4098-1191 PT Time Calculation (min): 23 min  PT Assessment / Plan / Recommendation  PT Comments   Pt concerned about needing to have a BM and being unable to.  He continues with dypnea on exertion even with 4L O2  Follow Up Recommendations  Home health PT;Supervision/Assistance - 24 hour     Does the patient have the potential to tolerate intense rehabilitation     Barriers to Discharge        Equipment Recommendations       Recommendations for Other Services    Frequency Min 3X/week   Progress towards PT Goals Progress towards PT goals: Progressing toward goals  Plan Current plan remains appropriate    Precautions / Restrictions Precautions Precautions: Fall Precaution Comments: O2 dependent   Pertinent Vitals/Pain No c/o pain    Mobility  Bed Mobility Bed Mobility: Supine to Sit;Sit to Supine Supine to Sit: 6: Modified independent (Device/Increase time);HOB elevated Sit to Supine: 6: Modified independent (Device/Increase time);HOB elevated Details for Bed Mobility Assistance: Increased time.  Pt has difficulty scooting up in the bed once he is positioned. Pt continues to be dyspneic on exertion Transfers Transfers: Sit to Stand;Stand to Sit Sit to Stand: 5: Supervision;With upper extremity assist;From bed;From chair/3-in-1 Stand to Sit: 5: Supervision;With upper extremity assist;To chair/3-in-1;To bed Details for Transfer Assistance: Pt also performed sit to stand from [facility]. He is bothered by constipation,but was unable to have a BM after walking Ambulation/Gait Ambulation/Gait Assistance: 4: Min guard Ambulation Distance (Feet): 125 Feet Assistive device: Rolling walker (wide RW) Ambulation/Gait Assistance Details: min hands on guard for balance  assist and safety in crowded hallway. Pt dyspneic on 4L O2 Occasional cues to step up into  RW Gait Pattern: Step-through pattern;Decreased stride length;Wide base of support Gait velocity: decr General Gait Details: pt has difficulty with dyspnea in gait and tends to rush when he gets close to his destination.  Stairs: No Wheelchair Mobility Wheelchair Mobility: No    Exercises     PT Diagnosis:    PT Problem List:   PT Treatment Interventions:     PT Goals (current goals can now be found in the care plan section)    Visit Information  Last PT Received On: 09/10/12 Assistance Needed: +1 History of Present Illness: acute on chronic respiratory failure, CHF    Subjective Data      Cognition  Cognition Arousal/Alertness: Awake/alert Behavior During Therapy: WFL for tasks assessed/performed Overall Cognitive Status: Within Functional Limits for tasks assessed    Balance  Static Standing Balance Static Standing - Balance Support: Right upper extremity supported;Left upper extremity supported Static Standing - Level of Assistance: 5: Stand by assistance  End of Session PT - End of Session Equipment Utilized During Treatment: Oxygen Activity Tolerance: Patient tolerated treatment well Patient left: with family/visitor present;with call bell/phone within reach;in bed Nurse Communication: Mobility status   GP     Donnetta Hail 09/10/2012, 3:46 PM

## 2012-09-10 NOTE — Progress Notes (Signed)
PULMONARY  / CRITICAL CARE MEDICINE  Name: [name] RUSSOMANNO Sr. MRN: [id number] DOB: July 06, 1932    ADMISSION DATE:  08/27/2012 CONSULTATION DATE:  08/28/2012  REFERRING MD :  Berton Mount PRIMARY SERVICE:  [facility]  CHIEF COMPLAINT:  Short of breath  BRIEF PATIENT DESCRIPTION:  79 yo male former smoker presented with severe dyspnea from acute pulmonary edema, acute diastolic dysfunction, and new onset A fib with RVR.  He is followed by Dr. [name] for severe COPD, chronic respiratory failure on home oxygen, OSA on BiPAP, and NSCLC s/p RULectomy and adjuvant chemo in 2006.  PCCM consulted to assist with respiratory management, and to assist determination whether he is candidate for cardioversion.  He was recently treated as outpt for Lt ankle cellulitis.  SIGNIFICANT EVENTS: 6/12> Admit with A fib RVR, acute diastolic heart failure 6/14> Hemoptysis on Xarelto=> stopped, placed on Levaquin/ Solumedrol. 6/15> wife reports similar lumps of bloody phlegm thru the night; seems diminished this AM, persist rhonchi/congestion.  STUDIES:  6/12 Echo >> mod LVH, EF 65 to 70%, mild/mod LA and RA dilation 615 CT chest>>>Markedly asymmetric bilateral airspace disease. Airspace disease is most prominent, with areas of consolidation, in the posterior and inferior right upper lobe ( the patient has a history of right lower lobectomy). Findings in the right upper lobe are favored to be due to pneumonia or aspiration. 2. New ground-glass opacities in the left upper lobe and left lower lobe may be due to early infection. Underlying mild pulmonary edema cannot be excluded in this patient with cardiomegaly and small bilateral pleural effusions. 3. New 5 mm pulmonary nodule left upper lobe. 4. Cardiomegaly with three-vessel coronary artery disease and dual lead cardiac pacer. 5. Small bilateral pleural effusions 6. Aortic valve calcifications. This finding raises the possibility of aortic valvular stenosis.   LINES /  TUBES: PIV  CULTURES: Blood 6/12 >> MRSA screen 6/12 >> POSITIVE BAL 6/17>>> rare GNR, rare yeast>>>E Coli and candida  ANTIBIOTICS: Doxycycline 6/10 >> 6/12 Vancomycin 6/12 >>6/14 Zosyn 6/12 >> 6/14 Levaquin 6/14(e coli in lung)>>>6/20 Imipenem 6/20 >>   Subjective/Overnight:  Still having scant hemoptysis Looks more comfortable  VITAL SIGNS: Temp:  [97.4 F (36.3 C)-97.9 F (36.6 C)] 97.8 F (36.6 C) (06/26 0533) Pulse Rate:  [69-81] 77 (06/26 0831) Resp:  [18-20] 18 (06/26 0533) BP: (129-165)/(71-90) 137/90 mmHg (06/26 0831) SpO2:  [92 %-100 %] 100 % (06/26 0839) Weight:  [93.4 kg (205 lb 14.6 oz)] 93.4 kg (205 lb 14.6 oz) (06/26 0500) 4 liters   PHYSICAL EXAMINATION: General:  Chronically ill appearing male, No distress, speaks in full sentences, using accessory muscles Neuro:  Alert, follows commands, normal strength, seems anxious HEENT:  Mm moist, no JVD Cardiovascular:  Irregular, tachycardic, no murmur Lungs:  Decreased breath sounds, basilar rales, exp wheeze R>L but improved Abdomen:  Soft, non tender, + bowel sounds Musculoskeletal:  2+ leg edema R>L    Recent Labs Lab 09/08/12 1425 09/09/12 0500 09/10/12 0543  NA 135 135 136  K 4.2 3.6 3.5  CL 98 98 99  CO2 26 29 29   BUN 62* 60* 63*  CREATININE 2.36* 2.28* 2.30*  GLUCOSE 336* 134* 133*    Recent Labs Lab 09/08/12 0540 09/09/12 0500 09/10/12 0543  HGB 11.7* 11.5* 11.6*  HCT 35.2* 33.8* 34.8*  WBC 19.6* 19.3* 20.9*  PLT 360 347 353   Ct Head Wo Contrast  09/09/2012   *RADIOLOGY REPORT*  Clinical Data: Stroke.  The patient developed right-sided numbness and  slurred speech last evening.  Remote history of lung cancer.  CT HEAD WITHOUT CONTRAST  Technique:  Contiguous axial images were obtained from the base of the skull through the vertex without contrast.  Comparison: CT head without and with contrast 10/01/2004.  Findings: Mild generalized atrophy and white matter disease is present.  There is slight progression white matter disease since the prior exam.  No acute cortical infarct, hemorrhage, or mass lesion is present.  Ventricles are proportionate to the degree of atrophy. No significant extra-axial fluid collection is present.  The paranasal sinuses and mastoid air cells are clear.  The osseous skull is intact.  IMPRESSION:  1.  Slight progression of atrophy and white matter disease, likely reflecting sequelae of chronic microvascular ischemia. 2.  No acute intracranial abnormality.   Original Report Authenticated By: Marin Roberts, [name]   Ct Chest Wo Contrast  09/08/2012   *RADIOLOGY REPORT*  Clinical Data: Hemoptysis.  Prior right upper lobectomy for non- small cell lung cancer with chemotherapy 2006.  CT CHEST WITHOUT CONTRAST  Technique:  Multidetector CT imaging of the chest was performed following the standard protocol without IV contrast.  Comparison: Several prior CT scans most recent 08/30/2012 and 02/05/2012  Findings: There is been minimal improvement in the pulmonary parenchymal changes involving both lungs asymmetric greater on the right.  What remains may represent result of infection/aspiration. Interstitial spread of tumor not entirely excluded although a secondary less likely consideration.  Follow-up until complete clearance is recommended given the patient's history.  5 mm left upper lobe nodule  new compared to 2013 examination. Tumor not excluded.  Top normal sized low pretracheal lymph node with short axis dimension of 9.3 mm.  Cardiomegaly with sequential pacemaker in place.  Prominent coronary artery calcifications.  No pericardial effusion.  Atherosclerotic type changes of the aorta with ectasia similar to the prior exam.  Visualized upper abdominal structures without acute abnormality.  No bony destructive lesion.  Degenerative changes thoracic spine.  IMPRESSION: There is been minimal improvement in the pulmonary parenchymal changes involving both lungs  asymmetric greater on the right.  What remains may represent result of infection/aspiration.  Interstitial spread of tumor not entirely excluded although a secondary less likely consideration.  Follow-up until complete clearance is recommended given the patient's history.  5 mm left upper lobe nodule  new compared to 2013 examination. Tumor not excluded.  Please see above.   Original Report Authenticated By: Lacy Duverney, [name]    BNP (last 3 results)  Recent Labs  08/28/12 0500 08/31/12 0500 09/06/12 0545  PROBNP 6086.0* 7566.0* 1793.0*     ASSESSMENT / PLAN:  Hemoptysis in setting of COPD exac and anticoagulation for new AFib.  Much improved. Still w scant blood. Etiology presumed to be PNA but duration makes this less clear. He denies epistaxis.  CT chest 6/24 without obvious bleeding source beyond areas of resolving basilar PNA Plan: -Anticoag (Xarelto) stopped for now -- ??when to resume with continued AFib. Poss consider apixaban when restart anticoags. Would wait until hemoptysis has fully subsided for 2-3 days.  -Ok to do dccv once the anticoag issue can be resolved -continue COPD Rx as below... -levaquin changed to imipenem  >>E coli on BAL is ESBL+, ID following abx -will plan for repeat FOB 6/27 at 11:30. Recommend ENT eval to see if an alternative bleeding source identified. If so then I will cancel the bronchoscopy   New LUL 5mm pulm nodule.  No Endobronchial lesion seen on FOB. (Previously  followed by Dr Welton Flakes for oncology) PLAN: Fob cytology neg for CA F/u nodule later with imaging, no intervention at this time   Acute on chronic respiratory failure 2nd to acute pulmonary edema from acute diastolic heart failure and new onset A fib with RVR.  This in setting of severe COPD and OSA.  Now c/b PNA and hemoptysis.  E coli PNA (ESBL+) Plan: -Cont oxygen with goal SpO2 > 90% -tapering Po pred  -BiPAP qhs -negative fluid balance per cardiology and primary team  -continue  GFN 1200mg  Bid -cont brovana nebulizer BID with prn xopenex nebulizer, hold budesonide while on prednisone -cont cautious diuresis per cards  -agree with changed abx to cover e coli (6/20)  Renal Insuffic:  Improving Plan: -lasix titration per [facility] and Cardiology  Insomnia. Plan: -prn xanax, trazodone  Levy Pupa, MD, PhD 09/10/2012, 11:21 AM [facility] [phone] or if no answer [phone]

## 2012-09-10 NOTE — Progress Notes (Signed)
ELECTROPHYSIOLOGY ROUNDING NOTE    Patient Name: [name] WEGNER Sr. Date of Encounter: 09-10-2012    SUBJECTIVE:Patient feels well.  Shortness of breath improving.  Still with some hemoptysis.  Per pulmonary, plan to resume anticoagulation when hemoptysis resolved for 2-3 days.   TELEMETRY: Reviewed telemetry pt in atrial fibrillation with controlled ventricular response Filed Vitals:   09/10/12 0500 09/10/12 0533 09/10/12 0831 09/10/12 0839  BP:  132/81 137/90   Pulse:  81 77   Temp:  97.8 F (36.6 C)    TempSrc:  Axillary    Resp:  18    Height:      Weight: 205 lb 14.6 oz (93.4 kg)     SpO2:  92%  100%   . amiodarone  400 mg Oral BID  . arformoterol  15 mcg Nebulization BID  . atorvastatin  40 mg Oral QHS  . barrier cream  1 application Topical BID  . citalopram  10 mg Oral Daily  . diltiazem  180 mg Oral BID  . diphenhydrAMINE  25 mg Oral Q6H  . docusate sodium  200 mg Oral BID  . ezetimibe  10 mg Oral Daily  . fluconazole  100 mg Oral Daily  . furosemide  120 mg Intravenous BID  . Gerhardt's butt cream   Topical BID  . guaiFENesin  1,200 mg Oral BID  . hydrALAZINE  50 mg Oral BID  . imipenem-cilastatin  250 mg Intravenous Q6H  . insulin aspart  0-9 Units Subcutaneous TID WC  . insulin glargine  10 Units Subcutaneous QHS  . magnesium citrate  1 Bottle Oral Once  . magnesium oxide  400 mg Oral Daily  . metoprolol succinate  25 mg Oral BID WC  . multivitamin  1 tablet Oral Daily  . pantoprazole  40 mg Oral Daily  . predniSONE  20 mg Oral Daily  . senna-docusate  1 tablet Oral QHS  . sodium chloride  3 mL Intravenous Q12H  . tiotropium  18 mcg Inhalation Daily  . valACYclovir  1,000 mg Oral TID     Intake/Output Summary (Last 24 hours) at 09/10/12 0941 Last data filed at 09/10/12 0831  Gross per 24 hour  Intake    990 ml  Output   2800 ml  Net  -1810 ml    LABS: Basic Metabolic Panel:  Recent Labs  16/10/96 0500 09/10/12 0543  NA 135 136  K 3.6  3.5  CL 98 99  CO2 29 29  GLUCOSE 134* 133*  BUN 60* 63*  CREATININE 2.28* 2.30*  CALCIUM 8.2* 8.2*   CBC:  Recent Labs  09/09/12 0500 09/10/12 0543  WBC 19.3* 20.9*  NEUTROABS 16.8*  --   HGB 11.5* 11.6*  HCT 33.8* 34.8*  MCV 91.1 91.8  PLT 347 353    Radiology/Studies:  Dg Chest 2 View 09/01/2012   *RADIOLOGY REPORT*  Clinical Data: Shortness of breath.  CHEST - 2 VIEW  Comparison: 08/31/2012.  Findings: The left subclavian Port-A-Cath tip it is most likely in the mid SVC at the level of the carina.  Stable pacer wires.  The heart is enlarged but unchanged.  Persistent bilateral infiltrates and effusions.  IMPRESSION:  1.  The Port-A-Cath tip appears to be in the mid SVC. 2.  Persistent bilateral infiltrates and small effusions.   Original Report Authenticated By: Rudie Meyer, [name]   PHYSICAL EXAM Chronically ill appearing NAD HEENT: Unremarkable Neck:  7 cm JVD, no thyromegally Lungs:  Scattered rales  and rhonchi HEART:  IRegular rate rhythm, no murmurs, no rubs, no clicks Abd:  soft, positive bowel sounds, no organomegally, no rebound, no guarding Ext:  2 plus pulses, no edema, no cyanosis, no clubbing Skin:  No rashes no nodules Neuro:  CN II through XII intact, motor grossly intact    Principal Problem:   Acute-on-chronic respiratory failure Active Problems:   CARCINOMA, LUNG, SQUAMOUS CELL   CHRONIC OBSTRUCTIVE PULMONARY DISEASE, SEVERE   HTN (hypertension)   DM (diabetes mellitus) type II controlled with renal manifestation   Atrial fibrillation   CKD (chronic kidney disease), stage III   Cellulitis of left ankle   Acute on chronic diastolic congestive heart failure   Leukocytosis   Elevation of cardiac enzymes   Hemoptysis   Pneumonia due to Gram-negative bacteria   Acute encephalopathy  Rec: would continue current supportive care. He will need 3 weeks of anti-coagulation with coumadin with therapeutic INR's before DCCV. His wife states that he prefers  rehab as an outpatient and at home if possible.  Leonia Reeves.D.

## 2012-09-10 NOTE — Progress Notes (Addendum)
ANTIBIOTIC CONSULT NOTE   Pharmacy Consult for Primaxin Indication: ESBL Pneumonia  No Known Allergies   Labs:  Recent Labs  09/08/12 0540 09/08/12 1425 09/09/12 0500 09/10/12 0543  WBC 19.6*  --  19.3* 20.9*  HGB 11.7*  --  11.5* 11.6*  PLT 360  --  347 353  CREATININE 2.13* 2.36* 2.28* 2.30*    Assessment: 66 YOM with chronic respiratory failure d/t COPD/CHF, who had completed 9 days of Levaquin for pneumonia, BAL culture (6/17) is positive for ESBL E.Coli.  He is afebrile, wbc elevated at 19, scr 2.30, est. crcl ~ 30 ml/min  Day 7 of Primaxin therapy  Goal of Therapy:  Appropriate Primaxin dosing  Plan:  -Continue  Primaxin 250 mg iv q 6 hrs - f/u renal function - Restart Xarelto when appropriate  Thank you. Okey Regal, PharmD [phone]  09/10/2012,8:41 AM

## 2012-09-10 NOTE — Progress Notes (Signed)
NUTRITION FOLLOW UP/CONSULT  Intervention:   Add Ensure Complete po daily, each supplement provides 350 kcal and 13 grams of protein. RD will continue to follow.   New Nutrition Dx:   Increased nutrient needs r/t COPD and acute medical issues AEB estimated needs.  Goal:   PO intake to meet >/=90% estimated nutrition needs. Met  Monitor:   PO intake, weight trends  Assessment:   Continues to have small amount of hemoptysis, s/p bronch. ENT consult pending. Question underlying malignancy vs PNA. Now with possible shingles. Dermatology consult pending.  FOB planned for tomorrow morning.  BSE completed 6/25 - SLP recommending Regular diet with thin liquids. Pt continues to eat 50-100% most meals. Weight is stable. Family is bringing in outside foods to help with oral intake - going to have a steak from [employer]. Agreeable to Ensure Complete daily to help him get stronger.  Currently with 2+ leg edema.  Height: Ht Readings from Last 1 Encounters:  08/27/12 5\' 7"  (1.702 m)    Weight Status:   Wt Readings from Last 1 Encounters:  09/10/12 205 lb 14.6 oz (93.4 kg)  Admit wt 206 lb - stable.  Re-estimated needs:  Kcal: 2050-2350  Protein: 93-112g  Fluid: ~2.0 L/day   Skin: skin tear L arm, edema noted in U and L extremities   Diet Order: Cardiac   Intake/Output Summary (Last 24 hours) at 09/10/12 1500 Last data filed at 09/10/12 1353  Gross per 24 hour  Intake   1906 ml  Output   2600 ml  Net   -694 ml    Last BM: 6/25   Labs:   Recent Labs Lab 09/08/12 1425 09/09/12 0500 09/10/12 0543  NA 135 135 136  K 4.2 3.6 3.5  CL 98 98 99  CO2 26 29 29   BUN 62* 60* 63*  CREATININE 2.36* 2.28* 2.30*  CALCIUM 7.9* 8.2* 8.2*  GLUCOSE 336* 134* 133*    CBG (last 3)   Recent Labs  09/09/12 2146 09/10/12 0748 09/10/12 1209  GLUCAP 258* 111* 165*    Scheduled Meds: . amiodarone  400 mg Oral BID  . arformoterol  15 mcg Nebulization BID  .  atorvastatin  40 mg Oral QHS  . barrier cream  1 application Topical BID  . citalopram  10 mg Oral Daily  . diltiazem  180 mg Oral BID  . diphenhydrAMINE  25 mg Oral Q6H  . docusate sodium  200 mg Oral BID  . ezetimibe  10 mg Oral Daily  . fluconazole  100 mg Oral Daily  . Gerhardt's butt cream   Topical BID  . guaiFENesin  1,200 mg Oral BID  . hydrALAZINE  50 mg Oral BID  . imipenem-cilastatin  250 mg Intravenous Q6H  . insulin aspart  0-9 Units Subcutaneous TID WC  . insulin glargine  10 Units Subcutaneous QHS  . magnesium oxide  400 mg Oral Daily  . metoprolol succinate  25 mg Oral BID WC  . multivitamin  1 tablet Oral Daily  . pantoprazole  40 mg Oral Daily  . predniSONE  20 mg Oral Daily  . senna-docusate  1 tablet Oral QHS  . sodium chloride  3 mL Intravenous Q12H  . tiotropium  18 mcg Inhalation Daily  . valACYclovir  1,000 mg Oral TID    Jarold Motto MS, RD, LDN Pager: [phone] After-hours pager: [phone]

## 2012-09-10 NOTE — Consult Note (Signed)
[name] LINCH Sr.   DOB:1933/02/02   ZO#:[id number]   JWJ#:[id number]  Subjective: Patient is a 79 year old male who with h/o chronic respiratory failure on 4 liters of oxygen at home secondary to COPD, OSA, CHF, Afib and CKD.  He was admitted secondary to shortness of breath that started 4-5 days before admission associated with cough and subjective fevers.  It worsened, and he was placed on bipap in the ED, eval CXR was negative for pneumonia, small bilateral effusions, and vascular congestion associated with CHF.  He does have a history of squamous cell lung cancer in 2006 and was treated with right upper lobectomy and adjuvant chemotherapy.  He was placed on antibiotics for an atypical pneumonia, on lasix for his COPD/CHF, and was weaned down to his home level of oxygen,  He later on 6/14 he developed Hemoptysis.  He was on Xarelto and this was held.  His dyspnea, hemoptysis began to worsen, CT chest was concerning for markedly asymmetric bilateral airspace disease with areas of consolidation in the posterior and inferior right upper lobe. A be secondary to pneumonia versus aspiration. New groundglass opacities in the left upper lobe and left lower lobe. Underlying mild pulmonary edema cannot be excluded. New 5 mm pulmonary nodule left upper lobe. Small bilateral pleural effusions. Aortic valve calcifications.  A bronchoscopy with BAL was done, the bronchoscopy did not reveal endobronchial lesions, BAL was E. Coli ESBL positive.  He is currently on Primaxin. He later developed shingles, ID was consulted and he was started on Valtrex 1gm TID.   However, his hemoptysis has started to worsen, and he remains short of breath.  He is on 4 liters of oxygen [place].  He may go for a bronchoscopy tomorrow.      Objective:  Filed Vitals:   09/10/12 0831  BP: 137/90  Pulse: 77  Temp:   Resp:     Body mass index is 32.24 kg/(m^2).  Intake/Output Summary (Last 24 hours) at 09/10/12 1039 Last data filed at 09/10/12  1035  Gross per 24 hour  Intake   1426 ml  Output   2800 ml  Net  -1374 ml     Sclerae unicteric  Oropharynx clear  No peripheral adenopathy  Lungs clear expiratory wheezing  Heart regular rate and rhythm  Abdomen benign  MSK no focal spinal tenderness, no peripheral edema  Neuro nonfocal   CBG (last 3)   Recent Labs  09/09/12 1719 09/09/12 2146 09/10/12 0748  GLUCAP 279* 258* 111*     Labs:  Lab Results  Component Value Date   WBC 20.9* 09/10/2012   HGB 11.6* 09/10/2012   HCT 34.8* 09/10/2012   MCV 91.8 09/10/2012   PLT 353 09/10/2012   NEUTROABS 16.8* 09/09/2012    Urine Studies No results found for this basename: UACOL, UAPR, USPG, UPH, UTP, UGL, UKET, UBIL, UHGB, UNIT, UROB, ULEU, UEPI, UWBC, URBC, UBAC, CAST, CRYS, UCOM, BILUA,  in the last 72 hours  Basic Metabolic Panel:  Recent Labs Lab 09/07/12 0640 09/08/12 0540 09/08/12 1425 09/09/12 0500 09/10/12 0543  NA 139 139 135 135 136  K 3.3* 3.7 4.2 3.6 3.5  CL 102 100 98 98 99  CO2 28 29 26 29 29   GLUCOSE 184* 127* 336* 134* 133*  BUN 64* 61* 62* 60* 63*  CREATININE 2.23* 2.13* 2.36* 2.28* 2.30*  CALCIUM 8.0* 8.1* 7.9* 8.2* 8.2*   GFR Estimated Creatinine Clearance: 29.3 ml/min (by C-G formula based on Cr of 2.3).  Liver Function Tests: No results found for this basename: AST, ALT, ALKPHOS, BILITOT, PROT, ALBUMIN,  in the last 168 hours No results found for this basename: LIPASE, AMYLASE,  in the last 168 hours No results found for this basename: AMMONIA,  in the last 168 hours Coagulation profile No results found for this basename: INR, PROTIME,  in the last 168 hours  CBC:  Recent Labs Lab 09/05/12 0500 09/07/12 0640 09/08/12 0540 09/09/12 0500 09/10/12 0543  WBC 19.0* 21.4* 19.6* 19.3* 20.9*  NEUTROABS 16.4*  --   --  16.8*  --   HGB 12.4* 11.0* 11.7* 11.5* 11.6*  HCT 36.1* 33.2* 35.2* 33.8* 34.8*  MCV 91.9 93.0 92.9 91.1 91.8  PLT 386 337 360 347 353   Cardiac Enzymes: No results  found for this basename: CKTOTAL, CKMB, CKMBINDEX, TROPONINI,  in the last 168 hours BNP: No components found with this basename: POCBNP,  CBG:  Recent Labs Lab 09/09/12 0752 09/09/12 1315 09/09/12 1719 09/09/12 2146 09/10/12 0748  GLUCAP 104* 132* 279* 258* 111*   D-Dimer No results found for this basename: DDIMER,  in the last 72 hours Hgb A1c No results found for this basename: HGBA1C,  in the last 72 hours Lipid Profile No results found for this basename: CHOL, HDL, LDLCALC, TRIG, CHOLHDL, LDLDIRECT,  in the last 72 hours Thyroid function studies No results found for this basename: TSH, T4TOTAL, FREET3, T3FREE, THYROIDAB,  in the last 72 hours Anemia work up No results found for this basename: VITAMINB12, FOLATE, FERRITIN, TIBC, IRON, RETICCTPCT,  in the last 72 hours Microbiology Recent Results (from the past 240 hour(s))  CULTURE, EXPECTORATED SPUTUM-ASSESSMENT     Status: None   Collection Time    08/31/12  1:10 PM      Result Value Range Status   Specimen Description SPUTUM   Final   Special Requests NONE   Final   Sputum evaluation     Final   Value: THIS SPECIMEN IS ACCEPTABLE. RESPIRATORY CULTURE REPORT TO FOLLOW.   Report Status 08/31/2012 FINAL   Final  CULTURE, RESPIRATORY (NON-EXPECTORATED)     Status: None   Collection Time    08/31/12  1:10 PM      Result Value Range Status   Specimen Description SPUTUM   Final   Special Requests NONE   Final   Gram Stain     Final   Value: RARE WBC PRESENT, PREDOMINANTLY PMN     RARE SQUAMOUS EPITHELIAL CELLS PRESENT     NO ORGANISMS SEEN   Culture NORMAL OROPHARYNGEAL FLORA   Final   Report Status 09/02/2012 FINAL   Final  AFB CULTURE WITH SMEAR     Status: None   Collection Time    09/01/12 11:19 AM      Result Value Range Status   Specimen Description BRONCHIAL WASHINGS   Final   Special Requests NONE   Final   ACID FAST SMEAR NO ACID FAST BACILLI SEEN   Final   Culture     Final   Value: CULTURE WILL BE  EXAMINED FOR 6 WEEKS BEFORE ISSUING A FINAL REPORT   Report Status PENDING   Incomplete  FUNGUS CULTURE W SMEAR     Status: None   Collection Time    09/01/12 11:19 AM      Result Value Range Status   Specimen Description BRONCHIAL WASHINGS   Final   Special Requests NONE   Final   Fungal Smear FEW HYPHAL ELEMENTS  Final   Culture CANDIDA ALBICANS   Final   Report Status PENDING   Incomplete  LEGIONELLA CULTURE     Status: None   Collection Time    09/01/12 11:19 AM      Result Value Range Status   Specimen Description BRONCHIAL WASHINGS   Final   Special Requests NONE   Final   Culture NO LEGIONELLA ISOLATED   Final   Report Status 09/06/2012 FINAL   Final  CULTURE, RESPIRATORY (NON-EXPECTORATED)     Status: None   Collection Time    09/01/12 11:19 AM      Result Value Range Status   Specimen Description BRONCHIAL WASHINGS   Final   Special Requests NONE   Final   Gram Stain     Final   Value: RARE WBC PRESENT, PREDOMINANTLY PMN     RARE SQUAMOUS EPITHELIAL CELLS PRESENT     RARE YEAST     RARE GRAM NEGATIVE RODS   Culture     Final   Value: FEW ESCHERICHIA COLI     Note: Confirmed Extended Spectrum Beta-Lactamase Producer (ESBL) CRITICAL RESULT CALLED TO, READ BACK BY AND VERIFIED WITH: JENNIFER Z@7 :45AM ON 09/04/12 BY DANTS     FEW CANDIDA ALBICANS   Report Status 09/04/2012 FINAL   Final   Organism ID, Bacteria ESCHERICHIA COLI   Final      Studies:  Ct Head Wo Contrast  09/09/2012   *RADIOLOGY REPORT*  Clinical Data: Stroke.  The patient developed right-sided numbness and slurred speech last evening.  Remote history of lung cancer.  CT HEAD WITHOUT CONTRAST  Technique:  Contiguous axial images were obtained from the base of the skull through the vertex without contrast.  Comparison: CT head without and with contrast 10/01/2004.  Findings: Mild generalized atrophy and white matter disease is present. There is slight progression white matter disease since the prior exam.   No acute cortical infarct, hemorrhage, or mass lesion is present.  Ventricles are proportionate to the degree of atrophy. No significant extra-axial fluid collection is present.  The paranasal sinuses and mastoid air cells are clear.  The osseous skull is intact.  IMPRESSION:  1.  Slight progression of atrophy and white matter disease, likely reflecting sequelae of chronic microvascular ischemia. 2.  No acute intracranial abnormality.   Original Report Authenticated By: Marin Roberts, [name]   Ct Chest Wo Contrast  09/08/2012   *RADIOLOGY REPORT*  Clinical Data: Hemoptysis.  Prior right upper lobectomy for non- small cell lung cancer with chemotherapy 2006.  CT CHEST WITHOUT CONTRAST  Technique:  Multidetector CT imaging of the chest was performed following the standard protocol without IV contrast.  Comparison: Several prior CT scans most recent 08/30/2012 and 02/05/2012  Findings: There is been minimal improvement in the pulmonary parenchymal changes involving both lungs asymmetric greater on the right.  What remains may represent result of infection/aspiration. Interstitial spread of tumor not entirely excluded although a secondary less likely consideration.  Follow-up until complete clearance is recommended given the patient's history.  5 mm left upper lobe nodule  new compared to 2013 examination. Tumor not excluded.  Top normal sized low pretracheal lymph node with short axis dimension of 9.3 mm.  Cardiomegaly with sequential pacemaker in place.  Prominent coronary artery calcifications.  No pericardial effusion.  Atherosclerotic type changes of the aorta with ectasia similar to the prior exam.  Visualized upper abdominal structures without acute abnormality.  No bony destructive lesion.  Degenerative changes thoracic spine.  IMPRESSION: There is been minimal improvement in the pulmonary parenchymal changes involving both lungs asymmetric greater on the right.  What remains may represent result of  infection/aspiration.  Interstitial spread of tumor not entirely excluded although a secondary less likely consideration.  Follow-up until complete clearance is recommended given the patient's history.  5 mm left upper lobe nodule  new compared to 2013 examination. Tumor not excluded.  Please see above.   Original Report Authenticated By: Lacy Duverney, [name]    Assessment/PLAN: 79 y.o. with h/o NSCLC who was admitted to the hospital on 6/12 with shortness of breath.    1. H/o NSCLC:  While hospitalized patient developed hemoptysis and CT chest showed a new 5mm left upper lobe nodule.  A bronchoscopy did not show any endobronchial lesions, however BAL was positive for ESBL E. Coli and he was started on Primaxin.  Patient will go for bronch possibly tomorrow to evaluate continued hemoptysis.  We will follow along closely to see if any lesions are identified and biopsied.  As patient recovers and is discharged, we would like to see him several weeks after to monitor him.  Should nothing cancerous return from biopsy, we will follow the nodule closely and consult CT surgery, or pulm for bronch should it increase on CT scan.    2. Other medical problems as per hospitalist team.    Cherie Ouch. Lyn Hollingshead, NP Medical Oncology [facility] Phone: [phone] Augustin Schooling 09/10/2012

## 2012-09-11 ENCOUNTER — Inpatient Hospital Stay (HOSPITAL_COMMUNITY): Payer: Medicare Other

## 2012-09-11 ENCOUNTER — Encounter (HOSPITAL_COMMUNITY)
Admission: EM | Disposition: A | Discharge status: Home Health | Payer: Self-pay | Source: Home / Self Care | Attending: Internal Medicine

## 2012-09-11 DIAGNOSIS — G9340 Encephalopathy, unspecified: Secondary | ICD-10-CM

## 2012-09-11 DIAGNOSIS — I5021 Acute systolic (congestive) heart failure: Secondary | ICD-10-CM

## 2012-09-11 HISTORY — PX: VIDEO BRONCHOSCOPY: SHX5072

## 2012-09-11 LAB — CBC
Hemoglobin: 11.1 g/dL — ABNORMAL LOW (ref 13.0–17.0)
MCV: 91.2 fL (ref 78.0–100.0)
Platelets: 301 10*3/uL (ref 150–400)
RBC: 3.54 MIL/uL — ABNORMAL LOW (ref 4.22–5.81)
WBC: 20.6 10*3/uL — ABNORMAL HIGH (ref 4.0–10.5)

## 2012-09-11 LAB — PRO B NATRIURETIC PEPTIDE: Pro B Natriuretic peptide (BNP): 1709 pg/mL — ABNORMAL HIGH (ref 0–450)

## 2012-09-11 LAB — GLUCOSE, CAPILLARY
Glucose-Capillary: 69 mg/dL — ABNORMAL LOW (ref 70–99)
Glucose-Capillary: 108 mg/dL — ABNORMAL HIGH (ref 70–99)

## 2012-09-11 LAB — BASIC METABOLIC PANEL
CO2: 28 mEq/L (ref 19–32)
Chloride: 99 mEq/L (ref 96–112)
Creatinine, Ser: 2.45 mg/dL — ABNORMAL HIGH (ref 0.50–1.35)
Glucose, Bld: 87 mg/dL (ref 70–99)
Sodium: 138 mEq/L (ref 135–145)

## 2012-09-11 LAB — MRSA PCR SCREENING: MRSA by PCR: POSITIVE — AB

## 2012-09-11 SURGERY — VIDEO BRONCHOSCOPY WITHOUT FLUORO
Anesthesia: Moderate Sedation | Laterality: Bilateral

## 2012-09-11 MED ORDER — MUPIROCIN 2 % EX OINT
1.0000 "application " | TOPICAL_OINTMENT | Freq: Two times a day (BID) | CUTANEOUS | Status: AC
  Administered 2012-09-11 – 2012-09-16 (×10): 1 via NASAL
  Filled 2012-09-11: qty 22

## 2012-09-11 MED ORDER — PHENYLEPHRINE HCL 0.25 % NA SOLN
NASAL | Status: DC | PRN
  Administered 2012-09-11: 2 via NASAL

## 2012-09-11 MED ORDER — PHENYLEPHRINE HCL 0.25 % NA SOLN
1.0000 | Freq: Four times a day (QID) | NASAL | Status: DC | PRN
  Filled 2012-09-11: qty 15

## 2012-09-11 MED ORDER — CHLORHEXIDINE GLUCONATE CLOTH 2 % EX PADS
6.0000 | MEDICATED_PAD | Freq: Every day | CUTANEOUS | Status: AC
  Administered 2012-09-12 – 2012-09-16 (×5): 6 via TOPICAL

## 2012-09-11 MED ORDER — DEXTROSE 50 % IV SOLN
INTRAVENOUS | Status: AC
  Administered 2012-09-11: 25 mL via INTRAVENOUS
  Filled 2012-09-11: qty 50

## 2012-09-11 MED ORDER — INSULIN GLARGINE 100 UNIT/ML ~~LOC~~ SOLN
8.0000 [IU] | Freq: Every day | SUBCUTANEOUS | Status: DC
  Administered 2012-09-11 – 2012-09-15 (×5): 8 [IU] via SUBCUTANEOUS
  Filled 2012-09-11 (×6): qty 0.08

## 2012-09-11 MED ORDER — TORSEMIDE 20 MG PO TABS
40.0000 mg | ORAL_TABLET | Freq: Two times a day (BID) | ORAL | Status: DC
  Filled 2012-09-11: qty 2

## 2012-09-11 MED ORDER — MIDAZOLAM HCL 10 MG/2ML IJ SOLN
INTRAMUSCULAR | Status: DC | PRN
  Administered 2012-09-11: 2 mg via INTRAVENOUS

## 2012-09-11 MED ORDER — DEXTROSE 50 % IV SOLN: 25.0000 mL | Freq: Once | INTRAVENOUS | Status: AC | PRN

## 2012-09-11 MED ORDER — FENTANYL CITRATE 0.05 MG/ML IJ SOLN
INTRAMUSCULAR | Status: DC | PRN
  Administered 2012-09-11: 50 ug via INTRAVENOUS

## 2012-09-11 MED ORDER — LIDOCAINE HCL 2 % EX GEL
Freq: Once | CUTANEOUS | Status: DC
  Filled 2012-09-11: qty 5

## 2012-09-11 MED ORDER — LIDOCAINE HCL 2 % EX GEL
CUTANEOUS | Status: DC | PRN
  Administered 2012-09-11: 1

## 2012-09-11 MED ORDER — TERBINAFINE HCL 250 MG PO TABS
250.0000 mg | ORAL_TABLET | Freq: Every day | ORAL | Status: DC
  Administered 2012-09-11 – 2012-09-16 (×6): 250 mg via ORAL
  Filled 2012-09-11 (×6): qty 1

## 2012-09-11 MED ORDER — LIDOCAINE HCL (PF) 1 % IJ SOLN
INTRAMUSCULAR | Status: DC | PRN
  Administered 2012-09-11 (×2): 6 mL

## 2012-09-11 MED ORDER — SODIUM CHLORIDE 0.9 % IV SOLN
INTRAVENOUS | Status: DC
  Administered 2012-09-11: 23:00:00 via INTRAVENOUS

## 2012-09-11 MED ORDER — TORSEMIDE 20 MG PO TABS
40.0000 mg | ORAL_TABLET | Freq: Two times a day (BID) | ORAL | Status: DC
  Administered 2012-09-11 – 2012-09-12 (×3): 40 mg via ORAL
  Filled 2012-09-11 (×4): qty 2

## 2012-09-11 MED ORDER — SENNOSIDES-DOCUSATE SODIUM 8.6-50 MG PO TABS
3.0000 | ORAL_TABLET | Freq: Every day | ORAL | Status: DC
  Administered 2012-09-11 – 2012-09-16 (×5): 3 via ORAL
  Filled 2012-09-11 (×7): qty 3

## 2012-09-11 MED ORDER — TRIAMCINOLONE ACETONIDE 0.1 % EX CREA
TOPICAL_CREAM | Freq: Three times a day (TID) | CUTANEOUS | Status: DC
  Administered 2012-09-11 – 2012-09-13 (×6): via TOPICAL
  Administered 2012-09-13: 1 via TOPICAL
  Administered 2012-09-13 – 2012-09-14 (×4): via TOPICAL
  Filled 2012-09-11 (×4): qty 15

## 2012-09-11 NOTE — H&P (View-Only) (Signed)
PULMONARY  / CRITICAL CARE MEDICINE  Name: [name] Stevenson. MRN: [id number] DOB: 08/05/1932    ADMISSION DATE:  08/27/2012 CONSULTATION DATE:  08/28/2012  REFERRING MD :  [name] Stevenson PRIMARY SERVICE:  [facility]  CHIEF COMPLAINT:  Short of breath  BRIEF PATIENT DESCRIPTION:  79 yo male former smoker presented with severe dyspnea from acute pulmonary edema, acute diastolic dysfunction, and new onset A fib with RVR.  He is followed by Dr. Samyiah Stevenson for severe COPD, chronic respiratory failure on home oxygen, OSA on BiPAP, and NSCLC s/p RULectomy and adjuvant chemo in 2006.  PCCM consulted to assist with respiratory management, and to assist determination whether he is candidate for cardioversion.  He was recently treated as outpt for Lt ankle cellulitis.  SIGNIFICANT EVENTS: 6/12> Admit with A fib RVR, acute diastolic heart failure 6/14> Hemoptysis on Xarelto=> stopped, placed on Levaquin/ Solumedrol. 6/15> wife reports similar lumps of bloody phlegm thru the night; seems diminished this AM, persist rhonchi/congestion.  STUDIES:  6/12 Echo >> mod LVH, EF 65 to 70%, mild/mod LA and RA dilation 615 CT chest>>>Markedly asymmetric bilateral airspace disease. Airspace disease is most prominent, with areas of consolidation, in the posterior and inferior right upper lobe ( the patient has a history of right lower lobectomy). Findings in the right upper lobe are favored to be due to pneumonia or aspiration. 2. New ground-glass opacities in the left upper lobe and left lower lobe may be due to early infection. Underlying mild pulmonary edema cannot be excluded in this patient with cardiomegaly and small bilateral pleural effusions. 3. New 5 mm pulmonary nodule left upper lobe. 4. Cardiomegaly with three-vessel coronary artery disease and dual lead cardiac pacer. 5. Small bilateral pleural effusions 6. Aortic valve calcifications. This finding raises the possibility of aortic valvular stenosis.   LINES /  TUBES: PIV  CULTURES: Blood 6/12 >> MRSA screen 6/12 >> POSITIVE BAL 6/17>>> rare GNR, rare yeast>>>E Coli and candida  ANTIBIOTICS: Doxycycline 6/10 >> 6/12 Vancomycin 6/12 >>6/14 Zosyn 6/12 >> 6/14 Levaquin 6/14(e coli in lung)>>>6/20 Imipenem 6/20 >>   Subjective/Overnight:  Still having scant hemoptysis Looks more comfortable  VITAL SIGNS: Temp:  [97.4 F (36.3 C)-97.9 F (36.6 C)] 97.8 F (36.6 C) (06/26 0533) Pulse Rate:  [69-81] 77 (06/26 0831) Resp:  [18-20] 18 (06/26 0533) BP: (129-165)/(71-90) 137/90 mmHg (06/26 0831) SpO2:  [92 %-100 %] 100 % (06/26 0839) Weight:  [93.4 kg (205 lb 14.6 oz)] 93.4 kg (205 lb 14.6 oz) (06/26 0500) 4 liters [place]  PHYSICAL EXAMINATION: General:  Chronically ill appearing male, No distress, speaks in full sentences, using accessory muscles Neuro:  Alert, follows commands, normal strength, seems anxious HEENT:  Mm moist, no JVD Cardiovascular:  Irregular, tachycardic, no murmur Lungs:  Decreased breath sounds, basilar rales, exp wheeze R>L but improved Abdomen:  Soft, non tender, + bowel sounds Musculoskeletal:  2+ leg edema R>L    Recent Labs Lab 09/08/12 1425 09/09/12 0500 09/10/12 0543  NA 135 135 136  K 4.2 3.6 3.5  CL 98 98 99  CO2 26 29 29  BUN 62* 60* 63*  CREATININE 2.36* 2.28* 2.30*  GLUCOSE 336* 134* 133*    Recent Labs Lab 09/08/12 0540 09/09/12 0500 09/10/12 0543  HGB 11.7* 11.5* 11.6*  HCT 35.2* 33.8* 34.8*  WBC 19.6* 19.3* 20.9*  PLT 360 347 353   Ct Head Wo Contrast  09/09/2012   *RADIOLOGY REPORT*  Clinical Data: Stroke.  The patient developed right-sided numbness and   slurred speech last evening.  Remote history of lung cancer.  CT HEAD WITHOUT CONTRAST  Technique:  Contiguous axial images were obtained from the base of the skull through the vertex without contrast.  Comparison: CT head without and with contrast 10/01/2004.  Findings: Mild generalized atrophy and white matter disease is present.  There is slight progression white matter disease since the prior exam.  No acute cortical infarct, hemorrhage, or mass lesion is present.  Ventricles are proportionate to the degree of atrophy. No significant extra-axial fluid collection is present.  The paranasal sinuses and mastoid air cells are clear.  The osseous skull is intact.  IMPRESSION:  1.  Slight progression of atrophy and white matter disease, likely reflecting sequelae of chronic microvascular ischemia. 2.  No acute intracranial abnormality.   Original Report Authenticated By: [name] Stevenson, [name]   Ct Chest Wo Contrast  09/08/2012   *RADIOLOGY REPORT*  Clinical Data: Hemoptysis.  Prior right upper lobectomy for non- small cell lung cancer with chemotherapy 2006.  CT CHEST WITHOUT CONTRAST  Technique:  Multidetector CT imaging of the chest was performed following the standard protocol without IV contrast.  Comparison: Several prior CT scans most recent 08/30/2012 and 02/05/2012  Findings: There is been minimal improvement in the pulmonary parenchymal changes involving both lungs asymmetric greater on the right.  What remains may represent result of infection/aspiration. Interstitial spread of tumor not entirely excluded although a secondary less likely consideration.  Follow-up until complete clearance is recommended given the patient's history.  5 mm left upper lobe nodule  new compared to 2013 examination. Tumor not excluded.  Top normal sized low pretracheal lymph node with short axis dimension of 9.3 mm.  Cardiomegaly with sequential pacemaker in place.  Prominent coronary artery calcifications.  No pericardial effusion.  Atherosclerotic type changes of the aorta with ectasia similar to the prior exam.  Visualized upper abdominal structures without acute abnormality.  No bony destructive lesion.  Degenerative changes thoracic spine.  IMPRESSION: There is been minimal improvement in the pulmonary parenchymal changes involving both lungs  asymmetric greater on the right.  What remains may represent result of infection/aspiration.  Interstitial spread of tumor not entirely excluded although a secondary less likely consideration.  Follow-up until complete clearance is recommended given the patient's history.  5 mm left upper lobe nodule  new compared to 2013 examination. Tumor not excluded.  Please see above.   Original Report Authenticated By: Steven Olson, [name]    BNP (last 3 results)  Recent Labs  08/28/12 0500 08/31/12 0500 09/06/12 0545  PROBNP 6086.0* 7566.0* 1793.0*     ASSESSMENT / PLAN:  Hemoptysis in setting of COPD exac and anticoagulation for new AFib.  Much improved. Still w scant blood. Etiology presumed to be PNA but duration makes this less clear. He denies epistaxis.  CT chest 6/24 without obvious bleeding source beyond areas of resolving basilar PNA Plan: -Anticoag (Xarelto) stopped for now -- ??when to resume with continued AFib. Poss consider apixaban when restart anticoags. Would wait until hemoptysis has fully subsided for 2-3 days.  -Ok to do dccv once the anticoag issue can be resolved -continue COPD Rx as below... -levaquin changed to imipenem  >>E coli on BAL is ESBL+, ID following abx -will plan for repeat FOB 6/27 at 11:30. Recommend ENT eval to see if an alternative bleeding source identified. If so then I will cancel the bronchoscopy   New LUL 5mm pulm nodule.  No Endobronchial lesion seen on FOB. (Previously   followed by Dr Khan for oncology) PLAN: Fob cytology neg for CA F/u nodule later with imaging, no intervention at this time   Acute on chronic respiratory failure 2nd to acute pulmonary edema from acute diastolic heart failure and new onset A fib with RVR.  This in setting of severe COPD and OSA.  Now c/b PNA and hemoptysis.  E coli PNA (ESBL+) Plan: -Cont oxygen with goal SpO2 > 90% -tapering Po pred  -BiPAP qhs -negative fluid balance per cardiology and primary team  -continue  GFN 1200mg Bid -cont brovana nebulizer BID with prn xopenex nebulizer, hold budesonide while on prednisone -cont cautious diuresis per cards  -agree with changed abx to cover e coli (6/20)  Renal Insuffic:  Improving Plan: -lasix titration per [facility] and Cardiology  Insomnia. Plan: -prn xanax, trazodone  Simone Tuckey, MD, PhD 09/10/2012, 11:21 AM [facility] [phone] or if no answer [phone]   

## 2012-09-11 NOTE — Progress Notes (Signed)
PATIENT DETAILS Name: [name] CHAVARRIA Sr. Age: 79 y.o. Sex: male Date of Birth: April 28, 1932 Admit Date: 08/27/2012 Admitting Physician Clydia Llano, MD [id number] [name], MD  Subjective: "[name] had a bad night", per Mrs. Stevenson.  The CPAP didn't fit well and kept him up per her report.  Patient had 1 medium sized solid BM (1st one in 9 days).  He appears in mild respiratory distress.  Wife who is continually at the patient's bedside has grown very anxious and feels her husband is declining.  Patient being transferred to step down for mild respiratory distress.  In need of BiPAP at least QHS and closer monitoring.  Assessment/Plan: Acute on CHRONIC OBSTRUCTIVE PULMONARY DISEASE, SEVERE - with increased work of breathing this am. Continues on 4L of oxygen via n/c - Will transfer to stepdown and patient will likely require bi-pap and increased monitoring / RN care - Lasix infusion decreased from 20 mg / hour to 10 mg/hour by cardiology on 6/27. - strict Is and Os. These are now being tracked correctly. Although they still do no correlate with daily weights. - Cont supportive care with nebs/MDI  - prednisone decreased to 20 mg on 6/22.  slow taper  Acute on chronic diastolic heart failure - Cardiology following and managing diuresis.  Lasix 120 bid until 6/26. Changed to 20 mg/hour on 6/26, then on 6/27 changed to 10 mg/hour -CXR 6/27 with fluid overload and PNA.  BNP is stable at 1700. - strict Is&Os - these are being tracked accurately as of 6/24. - Creatinine slowly trending up (likely due to diuresis) [facility] was consulted for assistance 6/27 - Do not lie flat, keep head of bed raised. - 2 gm sodium diet, 1200 ml fluid restriction. -Curb sided Dr. Gala Romney (6/26) who recommended lasix drip 20 mg/hour for 24 to 48 hours. He also recommended wrapping the patient's extremities with Ace wrap or Una boots.  Compression will be import to control his edema.  Anasarca /Ascites /pulmonary  edema  - Very difficult situation - In afib, with low albumin, not responding well to diuresis - Have requested assistance from both the CHF and Nephrology services. (6/27)  Hemoptysis -Etiology? Recurrence of underlying malignancy vs PNA -Still with fresh clots in the cup and blood mixed with water in the suction tank. 6/27 -Hgb relatively stable (approx 11.1) -Status post a bronchoscopy 09/01/12 with no endobronchial lesions seen, only old clot seen on bronchoscopy -Consulted ENT (Dr. [name]) 6/26 who performed fiberoptic laryngoscopy and found no pathology in the upper most respiratory/gi tract. -Dr. [name] to re-check bronchoscopy on 6/27.  PNA -BAL positive for ESBL E Coli -completed 9 days of levaquin-stop Levaquin, now on Primaxin-Day 8 -Appreciate ID consult.  Dr. Drue Second recommends 10 - 14 days of antibiotic   Rash on buttocks,  Scrotum, lower legs, groin and feet. -Spoke with Dr. [name] (patient's Dermatologist) on the telephone.  She feels this is at least partially a disseminated fungal infection as he has had extensive fungal infections in the past and is significantly immunocompromised. -Lamisil 250 mg po qd x 14 days per Dr. Swaziland -triamcinolone / silvadene cream TID to affected areas. -Will continue with valtrex started 6/23 for possible shingles on buttocks. -Avoid Gabapentin as it caused neurological changes.  Atrial fibrillation - Unfortunately off anti-coagulation given hemoptysis, per Dr. [name] ok to start Epixiban 2-3 days after hemoptysis resolves. -On metoprolol and Cardizem for rate control -Tolerating Amiodarone well  Dysarthia (6/25) -Resolved.  Likely the result of gabapentin  initiated 6/24. D/c'd 6/25. -Neuro consulted.  CT head negative for stroke.  CKD (chronic kidney disease), stage III  - Creatinine slowly increasing likely due to diuresis. - [facility] consulted 6/27 to assist with diuresis and renal function.  Leukocytosis - WBC elevated  but stable (20) - Uncertain etiology. Multifactorial from possible pneumonia, on steroids - Monitor  Hypertension - Controlled- continue with current medication  Diabetes - Lantus decreased to 8 units, continue with SSI-s - patient with mild hypoglycemia this am (6/27)  CORONARY ARTERY DISEASE (nonobstructive disease 2006 catheterization)/Elevation of cardiac enzymes  -no CP and likely due to acute HF, demand ischemia from recent hypoxia and worsened renal function - Cardiology following  Cellulitis of left ankle  -has decreased rednessboth legs c/w stasis dermatitis- resolved   NSCLC s/p RULectomy and adjuvant chemo in 2006 - Does have a new lung nodule-will need close followup as an outpatient - Oncology has seen this admission, and will monitor lung nodule outpatient.  Disposition: Remain inpatient.    DVT Prophylaxis:  SCD's  Code Status: Full code  Family Communication Spouse at bedside  Procedures:  None  CONSULTS: Cardiology Pulmonology Infectious disease. Ear Nose and throat Oncology Dermatology Nephrology Advanced Heart Failure Team   MEDICATIONS: Scheduled Meds: . amiodarone  400 mg Oral BID  . arformoterol  15 mcg Nebulization BID  . atorvastatin  40 mg Oral QHS  . barrier cream  1 application Topical BID  . citalopram  10 mg Oral Daily  . diltiazem  180 mg Oral BID  . diphenhydrAMINE  25 mg Oral Q6H  . docusate sodium  200 mg Oral BID  . ezetimibe  10 mg Oral Daily  . feeding supplement  237 mL Oral Q24H  . guaiFENesin  1,200 mg Oral BID  . hydrALAZINE  50 mg Oral BID  . imipenem-cilastatin  250 mg Intravenous Q6H  . insulin aspart  0-9 Units Subcutaneous TID WC  . insulin glargine  10 Units Subcutaneous QHS  . lidocaine  15 mL Mouth/Throat Once  . magnesium oxide  400 mg Oral Daily  . metoprolol succinate  25 mg Oral BID WC  . multivitamin  1 tablet Oral Daily  . pantoprazole  40 mg Oral Daily  . predniSONE  20 mg Oral Daily  .  senna-docusate  1 tablet Oral QHS  . sodium chloride  3 mL Intravenous Q12H  . terbinafine  250 mg Oral Daily  . tiotropium  18 mcg Inhalation Daily  . triamcinolone cream   Topical TID  . valACYclovir  1,000 mg Oral TID   Continuous Infusions: . furosemide (LASIX) infusion 20 mg/hr (09/10/12 1207)   PRN Meds:.ALPRAZolam, Glycerin (Adult), levalbuterol, morphine injection, nitroGLYCERIN, ondansetron (ZOFRAN) IV, ondansetron, oxyCODONE, polyvinyl alcohol, sodium chloride, traZODone  Antibiotics: Anti-infectives   Start     Dose/Rate Route Frequency Ordered Stop   09/11/12 1000  terbinafine (LAMISIL) tablet 250 mg     250 mg Oral Daily 09/11/12 0724 09/25/12 0959   09/08/12 1100  fluconazole (DIFLUCAN) tablet 100 mg  Status:  Discontinued     100 mg Oral Daily 09/08/12 1013 09/11/12 0724   09/07/12 1600  valACYclovir (VALTREX) tablet 1,000 mg     1,000 mg Oral 3 times daily 09/07/12 1303 09/14/12 1559   09/04/12 1200  imipenem-cilastatin (PRIMAXIN) 250 mg in sodium chloride 0.9 % 100 mL IVPB     250 mg 200 mL/hr over 30 Minutes Intravenous 4 times per day 09/04/12 1610  08/29/12 1200  levofloxacin (LEVAQUIN) tablet 500 mg  Status:  Discontinued     500 mg Oral Every 48 hours 08/29/12 0957 09/04/12 1008   08/27/12 0900  vancomycin (VANCOCIN) 1,250 mg in sodium chloride 0.9 % 250 mL IVPB  Status:  Discontinued     1,250 mg 166.7 mL/hr over 90 Minutes Intravenous Every 24 hours 08/27/12 0826 08/28/12 1809   08/27/12 0900  piperacillin-tazobactam (ZOSYN) IVPB 3.375 g  Status:  Discontinued     3.375 g 12.5 mL/hr over 240 Minutes Intravenous 3 times per day 08/27/12 0826 08/28/12 1809   08/27/12 0600  levofloxacin (LEVAQUIN) IVPB 500 mg     500 mg 100 mL/hr over 60 Minutes Intravenous  Once 08/27/12 0554 08/27/12 0735       PHYSICAL EXAM: Vital signs in last 24 hours: Filed Vitals:   09/10/12 2055 09/11/12 0241 09/11/12 0651 09/11/12 0842  BP: 149/80 147/82 156/77   Pulse: 70  76 82 75  Temp: 97.5 F (36.4 C) 97.4 F (36.3 C) 97.6 F (36.4 C)   TempSrc: Oral Axillary Oral   Resp: 20 24 24 22   Height:      Weight:   95.5 kg (210 lb 8.6 oz)   SpO2: 96% 96% 95% 96%    Weight change: 2.1 kg (4 lb 10.1 oz) Filed Weights   09/09/12 0432 09/10/12 0500 09/11/12 0651  Weight: 97 kg (213 lb 13.5 oz) 93.4 kg (205 lb 14.6 oz) 95.5 kg (210 lb 8.6 oz)   Body mass index is 32.97 kg/(m^2).   Gen Exam: Awake and alert, appears tired, mild resp distress. Always pleasant Neck: Supple, No JVD.   Chest: minimal crackles, poor air movement. Mildly increased work of breathing CVS: S1 S2 Regular, no murmurs.  Abdomen: soft, BS +, non tender, non distended.  Extremities 2-3+ edema on dorsum of feet bilaterally, lower extremities warm to touch.  Neurologic:  No acute changes, Non focal Skin: multiple areas of rash.  Erythematous scale across his buttocks and scrotum.  Similar appearing rash on his distal lower extremities.   Plantar portion of feet are decreasing in redness (confluent erythema), and groin is red as well w/o scale - shows improvement over 6/25.   Intake/Output from previous day:  Intake/Output Summary (Last 24 hours) at 09/11/12 0855 Last data filed at 09/11/12 0653  Gross per 24 hour  Intake   1516 ml  Output   3000 ml  Net  -1484 ml     LAB RESULTS: CBC  Recent Labs Lab 09/05/12 0500 09/07/12 0640 09/08/12 0540 09/09/12 0500 09/10/12 0543 09/11/12 0500  WBC 19.0* 21.4* 19.6* 19.3* 20.9* 20.6*  HGB 12.4* 11.0* 11.7* 11.5* 11.6* 11.1*  HCT 36.1* 33.2* 35.2* 33.8* 34.8* 32.3*  PLT 386 337 360 347 353 301  MCV 91.9 93.0 92.9 91.1 91.8 91.2  MCH 31.6 30.8 30.9 31.0 30.6 31.4  MCHC 34.3 33.1 33.2 34.0 33.3 34.4  RDW 15.2 15.4 15.5 15.5 15.6* 15.7*  LYMPHSABS 1.4  --   --  1.5  --   --   MONOABS 1.3*  --   --  0.9  --   --   EOSABS 0.0  --   --  0.0  --   --   BASOSABS 0.0  --   --  0.0  --   --     Chemistries   Recent Labs Lab  09/08/12 0540 09/08/12 1425 09/09/12 0500 09/10/12 0543 09/11/12 0500  NA 139 135 135  136 138  K 3.7 4.2 3.6 3.5 4.0  CL 100 98 98 99 99  CO2 29 26 29 29 28   GLUCOSE 127* 336* 134* 133* 87  BUN 61* 62* 60* 63* 64*  CREATININE 2.13* 2.36* 2.28* 2.30* 2.45*  CALCIUM 8.1* 7.9* 8.2* 8.2* 8.3*    CBG:  Recent Labs Lab 09/10/12 1209 09/10/12 1730 09/10/12 1732 09/10/12 2227 09/11/12 0732  GLUCAP 165* 314* 297* 177* 69*    Urine Studies MICROBIOLOGY: Recent Results (from the past 240 hour(s))  AFB CULTURE WITH SMEAR     Status: None   Collection Time    09/01/12 11:19 AM      Result Value Range Status   Specimen Description BRONCHIAL WASHINGS   Final   Special Requests NONE   Final   ACID FAST SMEAR NO ACID FAST BACILLI SEEN   Final   Culture     Final   Value: CULTURE WILL BE EXAMINED FOR 6 WEEKS BEFORE ISSUING A FINAL REPORT   Report Status PENDING   Incomplete  FUNGUS CULTURE W SMEAR     Status: None   Collection Time    09/01/12 11:19 AM      Result Value Range Status   Specimen Description BRONCHIAL WASHINGS   Final   Special Requests NONE   Final   Fungal Smear FEW HYPHAL ELEMENTS   Final   Culture CANDIDA ALBICANS   Final   Report Status PENDING   Incomplete  LEGIONELLA CULTURE     Status: None   Collection Time    09/01/12 11:19 AM      Result Value Range Status   Specimen Description BRONCHIAL WASHINGS   Final   Special Requests NONE   Final   Culture NO LEGIONELLA ISOLATED   Final   Report Status 09/06/2012 FINAL   Final  CULTURE, RESPIRATORY (NON-EXPECTORATED)     Status: None   Collection Time    09/01/12 11:19 AM      Result Value Range Status   Specimen Description BRONCHIAL WASHINGS   Final   Special Requests NONE   Final   Gram Stain     Final   Value: RARE WBC PRESENT, PREDOMINANTLY PMN     RARE SQUAMOUS EPITHELIAL CELLS PRESENT     RARE YEAST     RARE GRAM NEGATIVE RODS   Culture     Final   Value: FEW ESCHERICHIA COLI     Note:  Confirmed Extended Spectrum Beta-Lactamase Producer (ESBL) CRITICAL RESULT CALLED TO, READ BACK BY AND VERIFIED WITH: JENNIFER Z@7 :45AM ON 09/04/12 BY DANTS     FEW CANDIDA ALBICANS   Report Status 09/04/2012 FINAL   Final   Organism ID, Bacteria ESCHERICHIA COLI   Final    RADIOLOGY STUDIES/RESULTS: Dg Chest 2 View  09/01/2012   *RADIOLOGY REPORT*  Clinical Data: Shortness of breath.  CHEST - 2 VIEW  Comparison: 08/31/2012.  Findings: The left subclavian Port-A-Cath tip it is most likely in the mid SVC at the level of the carina.  Stable pacer wires.  The heart is enlarged but unchanged.  Persistent bilateral infiltrates and effusions.  IMPRESSION:  1.  The Port-A-Cath tip appears to be in the mid SVC. 2.  Persistent bilateral infiltrates and small effusions.   Original Report Authenticated By: Rudie Meyer, [name]   Ct Chest Wo Contrast  08/30/2012   *RADIOLOGY REPORT*  Clinical Data: Hemoptysis.  Pneumonia versus edema.  CT CHEST WITHOUT CONTRAST  Technique:  Multidetector CT imaging  of the chest was performed following the standard protocol without IV contrast.  Comparison: Chest radiograph 08/30/2012 and chest CT 02/05/2012  Findings: Heart is enlarged.  Cardiomegaly appears stable. Stable prominent fat density, consistent with lipoma, between the left and right atria.  Dual lead pacer is present.  There is heavy coronary artery atherosclerotic calcification of all three coronary arteries.  Aortic valvular calcifications are present.  A left IJ central venous catheter terminates in the superior vena cava. Thyroid gland and thoracic inlet appear within normal limits.  Heavy atherosclerotic calcification of the thoracic aorta, without aneurysm.  Several mediastinal lymph nodes are visualized, and within normal limits for size.  No pathologically enlarged lymph nodes are seen in the thorax.  Evaluation for the hilar lymph nodes is somewhat limited without intravenous contrast.  Small bilateral pleural  effusions are present.  Multilevel degenerative changes of the thoracic spine.  Multiple benign hemangiomas in the thoracic spine vertebral bodies.  No acute or suspicious osseous abnormality.  No acute findings are identified in the abdomen.  Surgical clips are seen in the left upper quadrant.  There are postsurgical changes of the right lower lobectomy.  There is a background of centrilobular emphysema.  Stable scarring in the right upper lobe anteriorly.  No definite intralobular septal thickening.  There is markedly asymmetric airspace disease in the inferior and posterior aspect of the right upper lobe, where there are areas of consolidation.  There are ground-glass opacities which are new compared to prior chest CT in the left upper lobe, left lower lobe, and right middle lobe.  5 mm pulmonary nodule in the left upper lobe peripherally on image #27 of the lung windows is new.  IMPRESSION:  1.  Markedly asymmetric bilateral airspace disease.  Airspace disease is most prominent, with areas of consolidation, in the posterior and inferior right upper lobe ( the patient has a history of right lower lobectomy).  Findings in the right upper lobe are favored to be due to pneumonia or aspiration. 2.  New ground-glass opacities in the left upper lobe and left lower lobe may be due to early infection.  Underlying mild pulmonary edema cannot be excluded in this patient with cardiomegaly and small bilateral pleural effusions. 3.  New 5 mm pulmonary nodule left upper lobe. If the patient is at high risk for bronchogenic carcinoma, follow-up chest CT at 6-12 months is recommended.  If the patient is at low risk for bronchogenic carcinoma, follow-up chest CT at 12 months is recommended.  This recommendation follows the consensus statement: Guidelines for Management of Small Pulmonary Nodules Detected on CT Scans: A Statement from the Fleischner Society as published in Radiology 2005; 237:395-400. 4.  Cardiomegaly with  three-vessel coronary artery disease and dual lead cardiac pacer. 5.  Small bilateral pleural effusions 6.  Aortic valve calcifications. This finding raises the possibility of aortic valvular stenosis.  .   Original Report Authenticated By: Britta Mccreedy, [name]   Dg Chest Port 1 View  08/31/2012   *RADIOLOGY REPORT*  Clinical Data: Follow-up edema  PORTABLE CHEST - 1 VIEW  Comparison: Yesterday  Findings: The tip of the left subclavian Port-A-Cath has changed its orientation.  Based on a single view, it is likely in the azygos vein.  Right subclavian dual lead pacemaker device and leads are stable and intact.  Bilateral pleural effusions left greater than right are stable.  Diffuse edema is stable. Cardiomegaly.  IMPRESSION: Left subclavian Port-A-Cath tip has changed its position and is likely  in the azygos vein.  This can be confirmed with a lateral view.  Stable airspace disease and pleural effusions.   Original Report Authenticated By: Jolaine Click, [name]   Dg Chest Port 1 View  08/30/2012   *RADIOLOGY REPORT*  Clinical Data: Respiratory failure.  PORTABLE CHEST - 1 VIEW  Comparison: 08/29/2012 and 08/27/2012  Findings: Prominent interstitial markings could represent underlying edema.  There are persistent interstitial and airspace densities in the right mid and lower lung region.  Heart size remains enlarged.  Again noted is a right dual lead cardiac pacemaker.  Left subclavian Port-A-Cath in the SVC region.  Aortic arch is heavily calcified.  IMPRESSION:  Prominent interstitial markings may represent underlying edema. Again noted are increased densities in the right mid and lower lung region which could represent asymmetric edema but cannot exclude an infectious etiology.   Original Report Authenticated By: Richarda Overlie, [name]   Dg Chest Port 1 View  08/29/2012   *RADIOLOGY REPORT*  Clinical Data: Follow-up CHF  PORTABLE CHEST - 1 VIEW  Comparison: Prior chest x-ray 08/27/2012  Findings: Stable position of  left subclavian central venous catheter with the tip in the mid superior vena cava.  Right subclavian approach cardiac rhythm maintenance device with leads in the right atrium and right ventricle.  Slightly improved inspiratory volumes with clearing in the right base.  Stable elevation of the left hemidiaphragm resulting in blunting of the left costophrenic angle.  Decreasing interstitial edema with the residual asymmetric patchy opacity in the right mid and lower lobe. Small residual right pleural effusion.  Stable cardiomegaly. Aortic atherosclerosis again noted.  No pneumothorax.  IMPRESSION:  1.  Decreasing pulmonary edema with improved inspiratory volumes and clearing in the right base. 2.  Residual asymmetric patchy opacity in the right mid and lower lung may reflect residual asymmetric alveolar edema, or pneumonia. 3.  Persistent small right pleural effusion, chronic elevation of the left hemidiaphragm and cardiomegaly.   Original Report Authenticated By: Malachy Moan, [name]   Dg Chest Portable 1 View  08/27/2012   *RADIOLOGY REPORT*  Clinical Data: Shortness of breath.  History of lung cancer post lobectomy.  PORTABLE CHEST - 1 VIEW  Comparison: 02/02/2012  Findings: Shallow inspiration.  Cardiac enlargement with pulmonary vascular congestion and perihilar edema.  Bilateral pleural effusions.  Atelectasis in the lung bases.  No pneumothorax. Changes have developed since the previous study.  Postoperative changes in the right chest.  Stable appearance of cardiac pacemaker and left central venous catheter.  Calcified and tortuous aorta.  IMPRESSION: Interval development of cardiac enlargement, pulmonary vascular congestion, perihilar edema, and bilateral pleural effusions.   Original Report Authenticated By: Burman Nieves, [name]    [facility] Pager:[phone]  If 7PM-7AM, please contact night-coverage [email or web address] Password [facility] 09/11/2012, 8:55 AM   LOS: 15  days

## 2012-09-11 NOTE — Progress Notes (Signed)
CRITICAL VALUE ALERT  Critical value received:  mrsa  Date of notification:  t  Time of notification:  1542   Critical value read back:yes  Nurse who received alert: Berle Mull RN  MD notified (1st page):  n/a  Time of first page:  n/a  MD notified (2nd page):n/a  Time of second page:per protocol  Responding MD:  n/a  Time MD responded:  n/a

## 2012-09-11 NOTE — OR Nursing (Signed)
Transported to 2920 from Endoscopy after procedure.  Family members and pt belongings also taken to 2920 at that time. Ennis Forts, RN

## 2012-09-11 NOTE — Progress Notes (Signed)
  The HF team was asked to provide assistance with volume management.   Extensive chart reviewed. Patient seen and examined.  I saw him in endoscopy after he finished his bronchoscopy. He was found to have a small endobronchial lesion which was bleeding at the previous suture line concerning for an endobronchial lesion in cancer recurrent.  He was admitted for atrial fibrillation and unfortunately developed significant hemoptysis with anticoagulation. He also is a history of diastolic dysfunction and during his hospitalization he is troubled with severe upper and lower extremity edema. Yesterday we placed him on a high dose Lasix drip and also had his legs are wrapped. He is down over 2 L today however his weight is recorded as being 5 pounds up. I suspect this is inaccurate. His creatinine has risen from 2.3-2.45. This morning his legs are wrapped look quite good with no edema. He still has some upper upper extremity edema but this is mild. It is difficult to see his neck veins but these do not look overly elevated. His BNP is at its baseline and his weight is close too.  At this point I think most of his edema is due to third spacing. I would stop the Lasix drip. We'll ask nursing staff to wrap his upper extremities as well. Given his chronic lung disease and chronic diastolic heart failure I think he may do better with Demadex rather than the Lasix he was on at home. We will start him on Demadex 40 mg by mouth twice a day. We'll need to watch his renal function closely.   Given his complex medical condition and now likely recurrent lung cancer it may be reasonable to begin discussions about possible palliative care. I will leave this to the pulmonary and primary services for now.  We will follow as needed.  Jashae Wiggs,MD 1:25 PM

## 2012-09-11 NOTE — Consult Note (Signed)
[name] Newcomer Sr. is a 79 y.o. Male with a history of stage 4 CKD  (sCreat 2.8 on 08/19/12, 1.9 on 06/01/12) due to nephrosclerosis.  He has a history of hypertension and diabetes mellitus.  He has chronic edema treated with 200 mg of furosemide daily. He has a history of lung carcinoma in the past. Admitted for pneumonia couple of weeks ago and has  atrial fibrillation and unfortunately developed significant hemoptysis with anticoagulation. Evaluation suggests recurrent lung cancer. He  has a history of diastolic dysfunction and during this hospitalization he is troubled with severe upper and lower extremity edema. Yesterday Cardiology placed him on a high dose Lasix drip and leg and arn wraps.  We are asked to assist with diuretic management.   Past Medical History  Diagnosis Date  . Stricture and stenosis of esophagus 12/07/2004    EGD done on 12/07/2004  . GERD (gastroesophageal reflux disease)   . Hypoxemia     With chronic respiratory failure  . Increased prostate specific antigen (PSA) velocity 02/26/2011  . DM (diabetes mellitus) 02/26/2011  . Gout 02/26/2011  . COPD (chronic obstructive pulmonary disease)     oxygen at home, 4L  . OSA (obstructive sleep apnea) 02/26/2011    CPAP  . Paroxysmal atrial fibrillation   . Depression   . Cardiac arrest 2009    while in hospital   . Hypertension   . HTN (hypertension) 02/26/2011  . Cardiac arrest 2009    while in hospital  . Lung cancer     lung s/p lobectomy x2 on RT lung  . Basal cell carcinoma of nose 02/26/2011  . ETOH abuse 01/29/2012    started drinking again - hx cardiac arrest in 2009 due to dt's with organ failure  . CKD (chronic kidney disease) 02/28/2012  . Chronic diastolic CHF (congestive heart failure)     a. EF 55-60% by echo 2013.  . Sick sinus syndrome     a. s/p [employer] pacemaker 2010.  . Bradycardia     a. Admitted 2010 with severe bradycardia, runs of paroxysmal VT.   Marland Kitchen Paroxysmal VT     a. In 2010 in setting  of marked bradycardia.  Marland Kitchen CAD (coronary artery disease)     a. Nonobstructive CAD by cath 2006.  Marland Kitchen Esophageal ulcer     a. By EGD 10/2011.  Marland Kitchen Hiatal hernia     a. By EGD 10/2011.  . Valvular heart disease     a. Mild MR/mild AI/mild AS by echo 05/2011.   Past Surgical History  Procedure Laterality Date  . Lung lobectomy      x2 lobes on RT lung  . Knee arthroscopy      bilateral  . Pace maker    . Tonsillectomy    . Hernia repair      x2, esophageal  . Cardiac catheterization    . Esophagogastroduodenoscopy  10/25/2011    Procedure: ESOPHAGOGASTRODUODENOSCOPY (EGD);  Surgeon: Hart Carwin, MD;  Location: [facility];  Service: Endoscopy;  Laterality: N/A;  . Video bronchoscopy N/A 09/01/2012    Procedure: VIDEO BRONCHOSCOPY WITHOUT FLUORO;  Surgeon: Storm Frisk, MD;  Location: [facility] ENDOSCOPY;  Service: Cardiopulmonary;  Laterality: N/A;   Social History:  reports that he quit smoking about 16 years ago. His smoking use included Cigarettes. He has a 75 pack-year smoking history. His smokeless tobacco use includes Chew. He reports that  drinks alcohol. He reports that he does not use illicit drugs. Allergies:  No Known Allergies Family History  Problem Relation Age of Onset  . Heart disease Father   . Lung cancer Mother   . Cancer Other     lung cancer  . Heart disease Other   . Hypertension Other    Medications:  Scheduled: . amiodarone  400 mg Oral BID  . arformoterol  15 mcg Nebulization BID  . atorvastatin  40 mg Oral QHS  . barrier cream  1 application Topical BID  . citalopram  10 mg Oral Daily  . diltiazem  180 mg Oral BID  . diphenhydrAMINE  25 mg Oral Q6H  . docusate sodium  200 mg Oral BID  . ezetimibe  10 mg Oral Daily  . feeding supplement  237 mL Oral Q24H  . guaiFENesin  1,200 mg Oral BID  . hydrALAZINE  50 mg Oral BID  . imipenem-cilastatin  250 mg Intravenous Q6H  . insulin aspart  0-9 Units Subcutaneous TID WC  . insulin glargine  8 Units Subcutaneous  QHS  . lidocaine   Topical Once  . lidocaine  15 mL Mouth/Throat Once  . magnesium oxide  400 mg Oral Daily  . metoprolol succinate  25 mg Oral BID WC  . multivitamin  1 tablet Oral Daily  . pantoprazole  40 mg Oral Daily  . predniSONE  20 mg Oral Daily  . senna-docusate  3 tablet Oral QHS  . sodium chloride  3 mL Intravenous Q12H  . terbinafine  250 mg Oral Daily  . tiotropium  18 mcg Inhalation Daily  . [START ON 09/12/2012] torsemide  40 mg Oral BID  . triamcinolone cream   Topical TID  . valACYclovir  1,000 mg Oral TID   ROS: Has shingles on buttock during hospitalization  Blood pressure 171/85, pulse 72, temperature 98.1 F (36.7 C), temperature source Oral, resp. rate 13, height 5\' 7"  (1.702 m), weight 95.5 kg (210 lb 8.6 oz), SpO2 96.00%.  General appearance: alert and cooperative Head: Normocephalic, without obvious abnormality, atraumatic Eyes: negative Nose: Nares normal. Septum midline. Mucosa normal. No drainage or sinus tenderness. Chest wall: no tenderness GI: soft, non-tender; bowel sounds normal; no masses,  no organomegaly Extremities: edema 2+ Skin: Skin color, texture, turgor normal. No rashes or lesions Neurologic: Grossly normal Results for orders placed during the hospital encounter of 08/27/12 (from the past 48 hour(s))  GLUCOSE, CAPILLARY     Status: Abnormal   Collection Time    09/09/12  5:19 PM      Result Value Range   Glucose-Capillary 279 (*) 70 - 99 mg/dL  GLUCOSE, CAPILLARY     Status: Abnormal   Collection Time    09/09/12  9:46 PM      Result Value Range   Glucose-Capillary 258 (*) 70 - 99 mg/dL  BASIC METABOLIC PANEL     Status: Abnormal   Collection Time    09/10/12  5:43 AM      Result Value Range   Sodium 136  135 - 145 mEq/L   Potassium 3.5  3.5 - 5.1 mEq/L   Chloride 99  96 - 112 mEq/L   CO2 29  19 - 32 mEq/L   Glucose, Bld 133 (*) 70 - 99 mg/dL   BUN 63 (*) 6 - 23 mg/dL   Creatinine, Ser 4.54 (*) 0.50 - 1.35 mg/dL   Calcium  8.2 (*) 8.4 - 10.5 mg/dL   GFR calc non Af Amer 26 (*) >90 mL/min   GFR calc Af Denyse Dago  30 (*) >90 mL/min   Comment:            The eGFR has been calculated     using the CKD EPI equation.     This calculation has not been     validated in all clinical     situations.     eGFR's persistently     <90 mL/min signify     possible Chronic Kidney Disease.  CBC     Status: Abnormal   Collection Time    09/10/12  5:43 AM      Result Value Range   WBC 20.9 (*) 4.0 - 10.5 K/uL   RBC 3.79 (*) 4.22 - 5.81 MIL/uL   Hemoglobin 11.6 (*) 13.0 - 17.0 g/dL   HCT 16.1 (*) 09.6 - 04.5 %   MCV 91.8  78.0 - 100.0 fL   MCH 30.6  26.0 - 34.0 pg   MCHC 33.3  30.0 - 36.0 g/dL   RDW 40.9 (*) 81.1 - 91.4 %   Platelets 353  150 - 400 K/uL  GLUCOSE, CAPILLARY     Status: Abnormal   Collection Time    09/10/12  7:48 AM      Result Value Range   Glucose-Capillary 111 (*) 70 - 99 mg/dL  GLUCOSE, CAPILLARY     Status: Abnormal   Collection Time    09/10/12 12:09 PM      Result Value Range   Glucose-Capillary 165 (*) 70 - 99 mg/dL  GLUCOSE, CAPILLARY     Status: Abnormal   Collection Time    09/10/12  5:30 PM      Result Value Range   Glucose-Capillary 314 (*) 70 - 99 mg/dL  GLUCOSE, CAPILLARY     Status: Abnormal   Collection Time    09/10/12  5:32 PM      Result Value Range   Glucose-Capillary 297 (*) 70 - 99 mg/dL  GLUCOSE, CAPILLARY     Status: Abnormal   Collection Time    09/10/12 10:27 PM      Result Value Range   Glucose-Capillary 177 (*) 70 - 99 mg/dL  CBC     Status: Abnormal   Collection Time    09/11/12  5:00 AM      Result Value Range   WBC 20.6 (*) 4.0 - 10.5 K/uL   RBC 3.54 (*) 4.22 - 5.81 MIL/uL   Hemoglobin 11.1 (*) 13.0 - 17.0 g/dL   HCT 78.2 (*) 95.6 - 21.3 %   MCV 91.2  78.0 - 100.0 fL   MCH 31.4  26.0 - 34.0 pg   MCHC 34.4  30.0 - 36.0 g/dL   RDW 08.6 (*) 57.8 - 46.9 %   Platelets 301  150 - 400 K/uL  BASIC METABOLIC PANEL     Status: Abnormal   Collection Time     09/11/12  5:00 AM      Result Value Range   Sodium 138  135 - 145 mEq/L   Potassium 4.0  3.5 - 5.1 mEq/L   Chloride 99  96 - 112 mEq/L   CO2 28  19 - 32 mEq/L   Glucose, Bld 87  70 - 99 mg/dL   BUN 64 (*) 6 - 23 mg/dL   Creatinine, Ser 6.29 (*) 0.50 - 1.35 mg/dL   Calcium 8.3 (*) 8.4 - 10.5 mg/dL   GFR calc non Af Amer 24 (*) >90 mL/min   GFR calc Af Amer 28 (*) >90 mL/min  Comment:            The eGFR has been calculated     using the CKD EPI equation.     This calculation has not been     validated in all clinical     situations.     eGFR's persistently     <90 mL/min signify     possible Chronic Kidney Disease.  PRO B NATRIURETIC PEPTIDE     Status: Abnormal   Collection Time    09/11/12  5:00 AM      Result Value Range   Pro B Natriuretic peptide (BNP) 1709.0 (*) 0 - 450 pg/mL  GLUCOSE, CAPILLARY     Status: Abnormal   Collection Time    09/11/12  7:32 AM      Result Value Range   Glucose-Capillary 69 (*) 70 - 99 mg/dL  GLUCOSE, CAPILLARY     Status: Abnormal   Collection Time    09/11/12  9:17 AM      Result Value Range   Glucose-Capillary 108 (*) 70 - 99 mg/dL   Ct Head Wo Contrast  09/09/2012   *RADIOLOGY REPORT*  Clinical Data: Stroke.  The patient developed right-sided numbness and slurred speech last evening.  Remote history of lung cancer.  CT HEAD WITHOUT CONTRAST  Technique:  Contiguous axial images were obtained from the base of the skull through the vertex without contrast.  Comparison: CT head without and with contrast 10/01/2004.  Findings: Mild generalized atrophy and white matter disease is present. There is slight progression white matter disease since the prior exam.  No acute cortical infarct, hemorrhage, or mass lesion is present.  Ventricles are proportionate to the degree of atrophy. No significant extra-axial fluid collection is present.  The paranasal sinuses and mastoid air cells are clear.  The osseous skull is intact.  IMPRESSION:  1.  Slight  progression of atrophy and white matter disease, likely reflecting sequelae of chronic microvascular ischemia. 2.  No acute intracranial abnormality.   Original Report Authenticated By: Marin Roberts, [name]   Dg Chest Port 1v Same Day  09/11/2012   *RADIOLOGY REPORT*  Clinical Data: 79 year old male shortness of breath.  Lung cancer. Hemoptysis.  PORTABLE CHEST - 1 VIEW SAME DAY  Comparison: Chest CT 09/08/2012 and earlier.  Findings: AP portable semi upright view at 0936 hours.  Stable right chest cardiac pacemaker and left chest Port-A-Cath which is currently accessed.  Kyphotic view.  Stable cardiac size and mediastinal contours. Bilateral pleural blunting and is stable, without evidence of significant effusion on the recent CT. Bibasilar increased opacity has not significantly changed.  No pneumothorax.  Patchy right perihilar opacity is mildly less confluent than on 09/01/2012.  IMPRESSION: Little change since 09/01/2012.  Bibasilar opacity with pleural blunting (but no effusion on the recent CT) and patchy right perihilar opacity.   Original Report Authenticated By: Erskine Speed, [name]   Assessment:  1 Stage 4 CKD due to Diabetes mellitus with GFR at about baseline 2 Volume overload  Plan: 1 Diuresis per cardiology 2 We will follow  Brady Schiller C 09/11/2012, 3:15 PM       ,

## 2012-09-11 NOTE — Op Note (Signed)
Video Bronchoscopy Procedure Note  Date of Operation: 09/11/2012  Pre-op Diagnosis: Hemoptysis  Post-op Diagnosis: Same  Surgeon: Levy Pupa  Assistants: none  Anesthesia: conscious sedation, moderate sedation  Meds Given: fentanyl , versed 2mg  in divided doses, epi 1:10000 dil 5cc total, 1% lidocaine 10cc total  Operation: Flexible video fiberoptic bronchoscopy and biopsies.  Estimated Blood Loss: 10cc  Complications: none noted  Indications and History: [name] TICAS Sr. is 79 y.o. with history of NSCLCA of the RML and LL, s/p middle and lower lobectomies. He has been having hemoptysis since started on anticoagulation for A Fib. FOB 10 days ago showed significant old blood, especially in the LLL airways without apparent endobronchial source. He has continued to bleed, so repeat FOB performed now.  The risks, benefits, complications, treatment options and expected outcomes were discussed with the patient.  The possibilities of pneumothorax, pneumonia, reaction to medication, pulmonary aspiration, perforation of a viscus, bleeding, failure to diagnose a condition and creating a complication requiring transfusion or operation were discussed with the patient who freely signed the consent.    Description of Procedure: The patient was seen in the Preoperative Area, was examined and was deemed appropriate to proceed.  The patient was taken to [facility], identified as [name] Newcomer Sr. and the procedure verified as Flexible Video Fiberoptic Bronchoscopy.  A Time Out was held and the above information confirmed.   Conscious sedation was initiated as indicated above. The video fiberoptic bronchoscope was introduced via the R nare and a general inspection was performed which showed normal cords, normal trachea, normal main carina. The LLL, Lingular and LUL airways were normal without endobronchial lesion. There was some blood in the LLL, but it seemed to clear after lavage. The R sided  airways were inspected and showed distorted post-op anatomy with splayed RUL airways and a single suture line in the location of the RML. There was a small white endobronchial lesion on the suture line that bled when manipulated, concerning for cancer recurrence. The lesion was biopsied for pathology. 5cc of 1:10000 epi was injected onto the area. Endobronchial brushings were then taken from the suture line for cytology. Bronchial washings were pooled for cytology.  There was some initial moderate bleeding that stopped quickly.  The patient tolerated the procedure well. The bronchoscope was removed. There were no obvious complications.   Samples: 1. Endobronchial brushings from RML suture line 2. Endobronchial forceps biopsies from RML suture line 4. Pooled Bronchial washings  Plans:  We will review the cytology, pathology results with the patient when they become available.  Outpatient followup will be with Dr [name].    Levy Pupa, MD, PhD 09/11/2012, 12:19 PM [facility] [phone] or if no answer [phone]

## 2012-09-11 NOTE — Progress Notes (Signed)
[facility]    Date of Admission:  08/27/2012   Total days of antibiotics 17        Day 8 imipenem        Day 5 valtrex           ID: [name] Newcomer Sr. is a 79 y.o. male  former smoker CAD s/p pacemaker, NSCLCa s/p RULectomy and adjuvant chemo in 2006, has severe COPD on home O2, OSA on BIPAP, presented with severe dyspnea/hemoptysis from acute pulmonary edema, acute diastolic dysfunction, and new onset A fib with RVR.   Principal Problem:   Acute-on-chronic respiratory failure Active Problems:   CARCINOMA, LUNG, SQUAMOUS CELL   CHRONIC OBSTRUCTIVE PULMONARY DISEASE, SEVERE   HTN (hypertension)   DM (diabetes mellitus) type II controlled with renal manifestation   Atrial fibrillation   CKD (chronic kidney disease), stage III   Cellulitis of left ankle   Acute on chronic diastolic congestive heart failure   Leukocytosis   Elevation of cardiac enzymes   Hemoptysis   Pneumonia due to Gram-negative bacteria   Acute encephalopathy    Subjective: Frustrated for such long hospitalization. Don't know where source of hemoptysis is from. Rash on buttocks feels better. afebrile  Medications:  . amiodarone  400 mg Oral BID  . arformoterol  15 mcg Nebulization BID  . atorvastatin  40 mg Oral QHS  . barrier cream  1 application Topical BID  . citalopram  10 mg Oral Daily  . diltiazem  180 mg Oral BID  . diphenhydrAMINE  25 mg Oral Q6H  . docusate sodium  200 mg Oral BID  . ezetimibe  10 mg Oral Daily  . feeding supplement  237 mL Oral Q24H  . guaiFENesin  1,200 mg Oral BID  . hydrALAZINE  50 mg Oral BID  . imipenem-cilastatin  250 mg Intravenous Q6H  . insulin aspart  0-9 Units Subcutaneous TID WC  . insulin glargine  10 Units Subcutaneous QHS  . lidocaine  15 mL Mouth/Throat Once  . magnesium oxide  400 mg Oral Daily  . metoprolol succinate  25 mg Oral BID WC  . multivitamin  1 tablet Oral Daily  . pantoprazole  40 mg Oral Daily  . predniSONE  20  mg Oral Daily  . senna-docusate  1 tablet Oral QHS  . sodium chloride  3 mL Intravenous Q12H  . terbinafine  250 mg Oral Daily  . tiotropium  18 mcg Inhalation Daily  . triamcinolone cream   Topical TID  . valACYclovir  1,000 mg Oral TID    Objective: Vital signs in last 24 hours: Temp:  [97.4 F (36.3 C)-98.1 F (36.7 C)] 98.1 F (36.7 C) (06/27 0921) Pulse Rate:  [70-82] 72 (06/27 0921) Resp:  [20-24] 22 (06/27 0921) BP: (128-181)/(77-83) 181/81 mmHg (06/27 0921) SpO2:  [93 %-96 %] 93 % (06/27 0921) Weight:  [210 lb 8.6 oz (95.5 kg)] 210 lb 8.6 oz (95.5 kg) (06/27 0651) Constitutional: oriented to person, place, and time. He appears well-developed and well-nourished. No distress but has ongoing dry cough  HENT:  Mouth/Throat: Oropharynx is clear and moist. No oropharyngeal exudate.  Cardiovascular: Normal rate, regular rhythm and normal heart sounds. Exam reveals no gallop and no friction rub.  No murmur heard.  Pulmonary/Chest: Effort normal and breath sounds normal. No respiratory distress. He has no wheezes.  Abdominal: Soft. Bowel sounds are normal. He exhibits no distension. There is no tenderness.  Lymphadenopathy: no cervical  adenopathy.  Neurological: He is alert and oriented to person, place, and time.  Skin: he has patch of small clusters of papules to left buttocks with scattered lesions. Non are vesicular.  GU = groin and scrotum erythamatous plaque  Ext: erythema to plantar aspect of both feet Psychiatric: He has a normal mood and affect. His behavior is normal.     Lab Results  Recent Labs  09/10/12 0543 09/11/12 0500  WBC 20.9* 20.6*  HGB 11.6* 11.1*  HCT 34.8* 32.3*  NA 136 138  K 3.5 4.0  CL 99 99  CO2 29 28  BUN 63* 64*  CREATININE 2.30* 2.45*    Microbiology: 6/17 resp culture: ESBL ecoli  6/12 blood cx NGTD  Studies/Results: Ct Head Wo Contrast  09/09/2012   *RADIOLOGY REPORT*  Clinical Data: Stroke.  The patient developed right-sided  numbness and slurred speech last evening.  Remote history of lung cancer.  CT HEAD WITHOUT CONTRAST  Technique:  Contiguous axial images were obtained from the base of the skull through the vertex without contrast.  Comparison: CT head without and with contrast 10/01/2004.  Findings: Mild generalized atrophy and white matter disease is present. There is slight progression white matter disease since the prior exam.  No acute cortical infarct, hemorrhage, or mass lesion is present.  Ventricles are proportionate to the degree of atrophy. No significant extra-axial fluid collection is present.  The paranasal sinuses and mastoid air cells are clear.  The osseous skull is intact.  IMPRESSION:  1.  Slight progression of atrophy and white matter disease, likely reflecting sequelae of chronic microvascular ischemia. 2.  No acute intracranial abnormality.   Original Report Authenticated By: Marin Roberts, [name]   Dg Chest Port 1v Same Day  09/11/2012   *RADIOLOGY REPORT*  Clinical Data: 79 year old male shortness of breath.  Lung cancer. Hemoptysis.  PORTABLE CHEST - 1 VIEW SAME DAY  Comparison: Chest CT 09/08/2012 and earlier.  Findings: AP portable semi upright view at 0936 hours.  Stable right chest cardiac pacemaker and left chest Port-A-Cath which is currently accessed.  Kyphotic view.  Stable cardiac size and mediastinal contours. Bilateral pleural blunting and is stable, without evidence of significant effusion on the recent CT. Bibasilar increased opacity has not significantly changed.  No pneumothorax.  Patchy right perihilar opacity is mildly less confluent than on 09/01/2012.  IMPRESSION: Little change since 09/01/2012.  Bibasilar opacity with pleural blunting (but no effusion on the recent CT) and patchy right perihilar opacity.   Original Report Authenticated By: Erskine Speed, [name]     Assessment/Plan: 79yo M with hx of lung ca, sever copd presents with hemoptysis being treated for ESBL ecoli  pneumonia but also has shingle appear rash to buttocks, candidal skin infection of groin and nonspecific macular papular rash to bottom of feet.  Rash to groin c/w candidal infection = continue with antifugal cream/powder BID PRN  Buttock to rash c/w shingles = finish out valtrex 1gm TID x 7 days. Only needs contact isolation, not disseminated   Rash to feet = likely fungal, would use antifungal cream BID   esbl ecoli pna = treat for 8 days as HCAP. If bronchoscopy reveals still having purulent phlegm in lower airways would extend treatment to 10-14days if thought that he is not resolving from pneumonia.   Will sign off call if questions   Drue Second [facility] Cell: [phone] Pager: [phone]  09/11/2012, 10:17 AM

## 2012-09-11 NOTE — Progress Notes (Signed)
Stroke Team Progress Note  HISTORY ADE STMARIE Sr. is an 79 y.o. male former smoker CAD s/p pacemaker for sick sinus syndrome, NSCLCa s/p RULectomy and adjuvant chemo in 2006, with severe COPD on home O2, who presented with severe dyspnea/hemoptysis from acute pulmonary edema, acute diastolic dysfunction, and new onset A fib with RVR on 08/31/12. On admission he was started on heparin and xeralto , however on 6.14.14 patient had new onset hemoptysis and xeralto was stopped. During his stay patient has had recurrent hemoptysis which likely will make [facility] in future not possible. Per PCCM it may be possible to restart AC in one week but will need to reassess. PAtietn currently also being treated for PNA with a (+) BAL of ESBL E coli, completed 9 days of levaquin which was stopped after BAL and now on Primaxin-Day 6, in addition there is concern for shingles and he was started on Valtrex 1 gram TID for 7 days. Last night patient developed right sided numbness and slurred speech. Neurology was asked to evaluate for possible stroke.   Date last known well: 6.24.14  Time last known well: Unable to determine  tPA Given: No: unknown time onset and currently having hemoptysis  SUBJECTIVE  Recurrent hemoptysis, new afib.  Right sided numbness with slurred speech. Some mild respiratory disress, bipap, to 2900   OBJECTIVE Most recent Vital Signs: Filed Vitals:   09/10/12 2055 09/11/12 0241 09/11/12 0651 09/11/12 0842  BP: 149/80 147/82 156/77   Pulse: 70 76 82 75  Temp: 97.5 F (36.4 C) 97.4 F (36.3 C) 97.6 F (36.4 C)   TempSrc: Oral Axillary Oral   Resp: 20 24 24 22   Height:      Weight:   95.5 kg (210 lb 8.6 oz)   SpO2: 96% 96% 95% 96%   CBG (last 3)   Recent Labs  09/10/12 1732 09/10/12 2227 09/11/12 0732  GLUCAP 297* 177* 69*    IV Fluid Intake:   . furosemide (LASIX) infusion 20 mg/hr (09/10/12 1207)    MEDICATIONS  . amiodarone  400 mg Oral BID  . arformoterol  15 mcg  Nebulization BID  . atorvastatin  40 mg Oral QHS  . barrier cream  1 application Topical BID  . citalopram  10 mg Oral Daily  . diltiazem  180 mg Oral BID  . diphenhydrAMINE  25 mg Oral Q6H  . docusate sodium  200 mg Oral BID  . ezetimibe  10 mg Oral Daily  . feeding supplement  237 mL Oral Q24H  . guaiFENesin  1,200 mg Oral BID  . hydrALAZINE  50 mg Oral BID  . imipenem-cilastatin  250 mg Intravenous Q6H  . insulin aspart  0-9 Units Subcutaneous TID WC  . insulin glargine  10 Units Subcutaneous QHS  . lidocaine  15 mL Mouth/Throat Once  . magnesium oxide  400 mg Oral Daily  . metoprolol succinate  25 mg Oral BID WC  . multivitamin  1 tablet Oral Daily  . pantoprazole  40 mg Oral Daily  . predniSONE  20 mg Oral Daily  . senna-docusate  1 tablet Oral QHS  . sodium chloride  3 mL Intravenous Q12H  . terbinafine  250 mg Oral Daily  . tiotropium  18 mcg Inhalation Daily  . triamcinolone cream   Topical TID  . valACYclovir  1,000 mg Oral TID   PRN:  ALPRAZolam, Glycerin (Adult), levalbuterol, morphine injection, nitroGLYCERIN, ondansetron (ZOFRAN) IV, ondansetron, oxyCODONE, polyvinyl alcohol, sodium chloride, traZODone  Diet:  NPO Activity:  Up ad lib DVT Prophylaxis:  ---  CLINICALLY SIGNIFICANT STUDIES Basic Metabolic Panel:  Recent Labs Lab 09/10/12 0543 09/11/12 0500  NA 136 138  K 3.5 4.0  CL 99 99  CO2 29 28  GLUCOSE 133* 87  BUN 63* 64*  CREATININE 2.30* 2.45*  CALCIUM 8.2* 8.3*   Liver Function Tests: No results found for this basename: AST, ALT, ALKPHOS, BILITOT, PROT, ALBUMIN,  in the last 168 hours CBC:  Recent Labs Lab 09/05/12 0500  09/09/12 0500 09/10/12 0543 09/11/12 0500  WBC 19.0*  < > 19.3* 20.9* 20.6*  NEUTROABS 16.4*  --  16.8*  --   --   HGB 12.4*  < > 11.5* 11.6* 11.1*  HCT 36.1*  < > 33.8* 34.8* 32.3*  MCV 91.9  < > 91.1 91.8 91.2  PLT 386  < > 347 353 301  < > = values in this interval not displayed. Coagulation: No results found for  this basename: LABPROT, INR,  in the last 168 hours Cardiac Enzymes: No results found for this basename: CKTOTAL, CKMB, CKMBINDEX, TROPONINI,  in the last 168 hours Urinalysis: No results found for this basename: COLORURINE, APPERANCEUR, LABSPEC, PHURINE, GLUCOSEU, HGBUR, BILIRUBINUR, KETONESUR, PROTEINUR, UROBILINOGEN, NITRITE, LEUKOCYTESUR,  in the last 168 hours Lipid Panel    Component Value Date/Time   CHOL 156 08/25/2012 1653   TRIG 94.0 08/25/2012 1653   HDL 53.60 08/25/2012 1653   CHOLHDL 3 08/25/2012 1653   VLDL 18.8 08/25/2012 1653   LDLCALC 84 08/25/2012 1653   HgbA1C  Lab Results  Component Value Date   HGBA1C 6.2* 08/27/2012    Urine Drug Screen:   No results found for this basename: labopia, cocainscrnur, labbenz, amphetmu, thcu, labbarb    Alcohol Level: No results found for this basename: ETH,  in the last 168 hours  Ct Head Wo Contrast 09/09/2012   1.  Slight progression of atrophy and white matter disease, likely reflecting sequelae of chronic microvascular ischemia. 2.  No acute intracranial abnormality.     MRI of the brain  HAS PPM  MRA of the brain  HAS PPM  2D Echocardiogram  EF 65%, wall motion normal, moderate LVH  Carotid Doppler    CXR    EKG  atrial fibrillation, rate 90.   Therapy Recommendations home health  Physical Exam   Mental Status:  Alert, oriented, thought content appropriate. Speech slightly dysarthric Cranial Nerves:  II: Visual fields grossly normal, pupils equal, round, reactive to light and accommodation  III,IV, VI: ptosis not present, extra-ocular motions intact bilaterally  V,VII: smile symmetric, facial light touch sensation normal bilaterally  VIII: hearing normal bilaterally  IX,X: gag reflex present  XI: bilateral shoulder shrug  XII: midline tongue extension  Motor: 5/5 extremities Sensory: Pinprick and light touch intact throughout, and stated to be the same bilaterally  Plantars:  Mute bilaterally  Cerebellar:   normal finger-to-nose, normal heel-to-shin test  ASSESSMENT Mr. JUNIEL GROENE Sr. is a 79 y.o. male presenting with right hemi-sensory loss and slurred speech. Imaging confirms no acute infarct. Patient unable to have MRI due to PPM.  On aspirin 81 mg orally every day prior to admission. Now on no antithrombotics due to hemoptysis for secondary stroke prevention. Patient with resultant right hemi-sensory loss and slurred speech. Work up underway.   Diabetes mellitus, hgba1c, 6.2  Paroxysmal atrial fibrillation, will need novel agent when felt safe from pulmonary point of view.  hypertension  LDL  84  Hospital day # 15  TREATMENT/PLAN  In the setting of new afib and TIA symptoms (MRI not feasible), patient would benefit from anticoagulant; however, only start when felt safe by pulmonology as hemoptysis is occurring.  Add SCD  Goal LDL < 70, on statin   Gwendolyn Lima. Manson Passey, [facility], MBA, MHA [facility] Pager: [phone] 09/11/2012 3:23 PM  I reviewed note and agree with plan. Patient not in room when I went to evaluate patient.  Suanne Marker, MD 09/11/2012, 5:54 PM Certified in Neurology, Neurophysiology and Neuroimaging  [facility] [address], [address] [place], [place] [address] [phone]

## 2012-09-11 NOTE — Progress Notes (Signed)
Video bronchoscopy procedure performed. Bronchial washing intervention performed. Biopsy intervention performed. Brushing intervention performed.  Levy Pupa, MD, PhD 09/11/2012, 5:08 PM [facility] [phone] or if no answer [phone]

## 2012-09-11 NOTE — Progress Notes (Signed)
Patient placed on auto CPAP via full face mask per home settings. 4L O2 bled in, water chamber filled to max line, auto settings max 18.0 min 4.0. Tolerating well at this time. RT will continue to monitor.

## 2012-09-11 NOTE — Progress Notes (Signed)
PT Cancellation Note  Patient Details Name: [name] Stevenson. MRN: [id number] DOB: 02-02-1933   Cancelled Treatment:     Pt recently back from procedure, very tired and does not desire to get up right now. Pt and wife educated on importance of mobility. Pt reports he will try to get OOB with nursing later this evening.    Sherrine Maples Cheek 09/11/2012, 2:21 PM

## 2012-09-11 NOTE — Progress Notes (Signed)
Patient Name: [name] BIHM Sr.      SUBJECTIVE: feels about the same frustrated  ENT and CCM input appreciated   Past Medical History  Diagnosis Date  . Stricture and stenosis of esophagus 12/07/2004    EGD done on 12/07/2004  . GERD (gastroesophageal reflux disease)   . Hypoxemia     With chronic respiratory failure  . Increased prostate specific antigen (PSA) velocity 02/26/2011  . DM (diabetes mellitus) 02/26/2011  . Gout 02/26/2011  . COPD (chronic obstructive pulmonary disease)     oxygen at home, 4L  . OSA (obstructive sleep apnea) 02/26/2011    CPAP  . Paroxysmal atrial fibrillation   . Depression   . Cardiac arrest 2009    while in hospital   . Hypertension   . HTN (hypertension) 02/26/2011  . Cardiac arrest 2009    while in hospital  . Lung cancer     lung s/p lobectomy x2 on RT lung  . Basal cell carcinoma of nose 02/26/2011  . ETOH abuse 01/29/2012    started drinking again - hx cardiac arrest in 2009 due to dt's with organ failure  . CKD (chronic kidney disease) 02/28/2012  . Chronic diastolic CHF (congestive heart failure)     a. EF 55-60% by echo 2013.  . Sick sinus syndrome     a. s/p [employer] pacemaker 2010.  . Bradycardia     a. Admitted 2010 with severe bradycardia, runs of paroxysmal VT.   Marland Kitchen Paroxysmal VT     a. In 2010 in setting of marked bradycardia.  Marland Kitchen CAD (coronary artery disease)     a. Nonobstructive CAD by cath 2006.  Marland Kitchen Esophageal ulcer     a. By EGD 10/2011.  Marland Kitchen Hiatal hernia     a. By EGD 10/2011.  . Valvular heart disease     a. Mild MR/mild AI/mild AS by echo 05/2011.    Scheduled Meds:  Scheduled Meds: . amiodarone  400 mg Oral BID  . arformoterol  15 mcg Nebulization BID  . atorvastatin  40 mg Oral QHS  . barrier cream  1 application Topical BID  . citalopram  10 mg Oral Daily  . diltiazem  180 mg Oral BID  . diphenhydrAMINE  25 mg Oral Q6H  . docusate sodium  200 mg Oral BID  . ezetimibe  10 mg Oral Daily  . feeding  supplement  237 mL Oral Q24H  . guaiFENesin  1,200 mg Oral BID  . hydrALAZINE  50 mg Oral BID  . imipenem-cilastatin  250 mg Intravenous Q6H  . insulin aspart  0-9 Units Subcutaneous TID WC  . insulin glargine  10 Units Subcutaneous QHS  . lidocaine  15 mL Mouth/Throat Once  . magnesium oxide  400 mg Oral Daily  . metoprolol succinate  25 mg Oral BID WC  . multivitamin  1 tablet Oral Daily  . pantoprazole  40 mg Oral Daily  . predniSONE  20 mg Oral Daily  . senna-docusate  1 tablet Oral QHS  . sodium chloride  3 mL Intravenous Q12H  . terbinafine  250 mg Oral Daily  . tiotropium  18 mcg Inhalation Daily  . triamcinolone cream   Topical TID  . valACYclovir  1,000 mg Oral TID   Continuous Infusions: . furosemide (LASIX) infusion 20 mg/hr (09/10/12 1207)    PHYSICAL EXAM Filed Vitals:   09/10/12 1605 09/10/12 2055 09/11/12 0241 09/11/12 0651  BP: 128/83 149/80 147/82 156/77  Pulse: 77 70  76 82  Temp:  97.5 F (36.4 C) 97.4 F (36.3 C) 97.6 F (36.4 C)  TempSrc:  Oral Axillary Oral  Resp:  20 24 24   Height:      Weight:    210 lb 8.6 oz (95.5 kg)  SpO2:  96% 96% 95%   Weight 210??????  Well developed and nourished in no acute distress   No Clubbing cyanosis 2+ edema  pt getting breathing treatment on rounds  TELEMETRY: Reviewed telemetry pt in af :    Intake/Output Summary (Last 24 hours) at 09/11/12 0842 Last data filed at 09/11/12 0653  Gross per 24 hour  Intake   1516 ml  Output   3000 ml  Net  -1484 ml    LABS: Basic Metabolic Panel:  Recent Labs Lab 09/06/12 0545 09/07/12 0640 09/08/12 0540 09/08/12 1425 09/09/12 0500 09/10/12 0543 09/11/12 0500  NA 137 139 139 135 135 136 138  K 4.0 3.3* 3.7 4.2 3.6 3.5 4.0  CL 99 102 100 98 98 99 99  CO2 30 28 29 26 29 29 28   GLUCOSE 131* 184* 127* 336* 134* 133* 87  BUN 64* 64* 61* 62* 60* 63* 64*  CREATININE 2.15* 2.23* 2.13* 2.36* 2.28* 2.30* 2.45*  CALCIUM 8.5 8.0* 8.1* 7.9* 8.2* 8.2* 8.3*   Cardiac  Enzymes: No results found for this basename: CKTOTAL, CKMB, CKMBINDEX, TROPONINI,  in the last 72 hours CBC:  Recent Labs Lab 09/05/12 0500 09/07/12 0640 09/08/12 0540 09/09/12 0500 09/10/12 0543 09/11/12 0500  WBC 19.0* 21.4* 19.6* 19.3* 20.9* 20.6*  NEUTROABS 16.4*  --   --  16.8*  --   --   HGB 12.4* 11.0* 11.7* 11.5* 11.6* 11.1*  HCT 36.1* 33.2* 35.2* 33.8* 34.8* 32.3*  MCV 91.9 93.0 92.9 91.1 91.8 91.2  PLT 386 337 360 347 353 301   PROTIME: No results found for this basename: LABPROT, INR,  in the last 72 hours Liver Function Tests: No results found for this basename: AST, ALT, ALKPHOS, BILITOT, PROT, ALBUMIN,  in the last 72 hours No results found for this basename: LIPASE, AMYLASE,  in the last 72 hours BNP: BNP (last 3 results)  Recent Labs  08/28/12 0500 08/31/12 0500 09/06/12 0545  PROBNP 6086.0* 7566.0* 1793.0*      ASSESSMENT AND PLAN:  Principal Problem:   Acute-on-chronic respiratory failure Active Problems:   CARCINOMA, LUNG, SQUAMOUS CELL   CHRONIC OBSTRUCTIVE PULMONARY DISEASE, SEVERE   HTN (hypertension)   DM (diabetes mellitus) type II controlled with renal manifestation   Atrial fibrillation   CKD (chronic kidney disease), stage III   Cellulitis of left ankle   Acute on chronic diastolic congestive heart failure   Leukocytosis   Elevation of cardiac enzymes   Hemoptysis   Pneumonia due to Gram-negative bacteria   Acute encephalopathy  We still have inconsistent data on fluid balance. Cr up a little so will back of on lasix Await bronch Our options are extremely limited in what we can potentally do to help and anticoagulation is our next best hope Await permission from Pulm   Signed, Sherryl Manges MD  09/11/2012

## 2012-09-11 NOTE — Interval H&P Note (Signed)
PCCM Interval  Pt more lethargic this am - bad night's sleep. Didn't wean CPAP, received a xanax this am ~ 3:40am Filed Vitals:   09/11/12 1046 09/11/12 1050 09/11/12 1055 09/11/12 1100  BP: 156/103 167/93 160/91 170/84  Pulse:      Temp:      TempSrc:      Resp: 20 12 26 22   Height:      Weight:      SpO2: 94% 95% 95% 98%   Plan:  FOB this am with light sedation to see if we can localize bleeding site.   Levy Pupa, MD, PhD 09/11/2012, 11:36 AM [facility] [phone] or if no answer [phone]

## 2012-09-11 NOTE — Progress Notes (Signed)
Addendum  Patient seen and examined, chart and data base reviewed.  I agree with the above assessment and plan.  For full details please see Mrs. Algis Downs PA note.  Seen by pulmonology, status post bronchoscopy.  Bronchoscopy showed a lesion towards the old lobectomy suture line, this is very concerning for cancer recurrence.  Transferred back to step down because of respiratory distress, he will be on BiPAP as needed.   Clint Lipps, MD [facility] Pager: [phone] 09/11/2012, 2:47 PM

## 2012-09-12 ENCOUNTER — Inpatient Hospital Stay (HOSPITAL_COMMUNITY): Payer: Medicare Other

## 2012-09-12 LAB — CBC
Hemoglobin: 10.7 g/dL — ABNORMAL LOW (ref 13.0–17.0)
MCH: 30.8 pg (ref 26.0–34.0)
MCV: 93.4 fL (ref 78.0–100.0)
RBC: 3.47 MIL/uL — ABNORMAL LOW (ref 4.22–5.81)

## 2012-09-12 LAB — GLUCOSE, CAPILLARY
Glucose-Capillary: 175 mg/dL — ABNORMAL HIGH (ref 70–99)
Glucose-Capillary: 196 mg/dL — ABNORMAL HIGH (ref 70–99)

## 2012-09-12 MED ORDER — FUROSEMIDE 10 MG/ML IJ SOLN
160.0000 mg | Freq: Once | INTRAVENOUS | Status: AC
  Administered 2012-09-12: 160 mg via INTRAVENOUS
  Filled 2012-09-12: qty 16

## 2012-09-12 MED ORDER — TORSEMIDE 20 MG/2ML IV SOLN
100.0000 mg | Freq: Two times a day (BID) | INTRAVENOUS | Status: DC
  Filled 2012-09-12: qty 10

## 2012-09-12 MED ORDER — METOLAZONE 5 MG PO TABS
5.0000 mg | ORAL_TABLET | Freq: Two times a day (BID) | ORAL | Status: DC
  Administered 2012-09-12 – 2012-09-14 (×4): 5 mg via ORAL
  Filled 2012-09-12 (×5): qty 1

## 2012-09-12 MED ORDER — POLYETHYLENE GLYCOL 3350 17 G PO PACK
17.0000 g | PACK | Freq: Two times a day (BID) | ORAL | Status: DC
  Administered 2012-09-12 – 2012-09-16 (×9): 17 g via ORAL
  Filled 2012-09-12 (×11): qty 1

## 2012-09-12 MED ORDER — FUROSEMIDE 10 MG/ML IJ SOLN
160.0000 mg | Freq: Three times a day (TID) | INTRAVENOUS | Status: DC
  Administered 2012-09-13 – 2012-09-14 (×4): 160 mg via INTRAVENOUS
  Filled 2012-09-12 (×6): qty 16

## 2012-09-12 NOTE — Progress Notes (Signed)
Stroke Team Progress Note  HISTORY [name] MISURACA Sr. is an 79 y.o. male former smoker CAD s/p pacemaker for sick sinus syndrome, NSCLCa s/p RULectomy and adjuvant chemo in 2006, with severe COPD on home O2, who presented with severe dyspnea/hemoptysis from acute pulmonary edema, acute diastolic dysfunction, and new onset A fib with RVR on 08/31/12. On admission he was started on heparin and xeralto , however on 6.14.14 patient had new onset hemoptysis and xeralto was stopped. During his stay patient has had recurrent hemoptysis which likely will make [facility] in future not possible. Per PCCM it may be possible to restart AC in one week but will need to reassess. PAtietn currently also being treated for PNA with a (+) BAL of ESBL E coli, completed 9 days of levaquin which was stopped after BAL and now on Primaxin-Day 6, in addition there is concern for shingles and he was started on Valtrex 1 gram TID for 7 days.  Stroke team was initially consulted regarding acute onset language impairment and right sided numbness.  Date last known well: 6.24.14  Time last known well: Unable to determine  tPA Given: No: unknown time onset and currently having hemoptysis  SUBJECTIVE Patient and family indicated that language is back to normal and the numbness right arm is gone. Otherwise, he offers no new neurological complains. Recurrent hemoptysis, new afib.  Feels much better from a respiratory standpoint.   OBJECTIVE Most recent Vital Signs: Filed Vitals:   09/12/12 1006 09/12/12 1007 09/12/12 1125 09/12/12 1154  BP: 155/73 155/73    Pulse:      Temp:    98.3 F (36.8 C)  TempSrc:    Oral  Resp:      Height:      Weight:      SpO2:   96%    CBG (last 3)   Recent Labs  09/11/12 1724 09/11/12 2134 09/12/12 0811  GLUCAP 92 217* 126*    IV Fluid Intake:   . sodium chloride 20 mL/hr at 09/11/12 2258    MEDICATIONS  . amiodarone  400 mg Oral BID  . arformoterol  15 mcg Nebulization BID  .  atorvastatin  40 mg Oral QHS  . barrier cream  1 application Topical BID  . Chlorhexidine Gluconate Cloth  6 each Topical Q0600  . citalopram  10 mg Oral Daily  . diltiazem  180 mg Oral BID  . diphenhydrAMINE  25 mg Oral Q6H  . docusate sodium  200 mg Oral BID  . ezetimibe  10 mg Oral Daily  . feeding supplement  237 mL Oral Q24H  . guaiFENesin  1,200 mg Oral BID  . hydrALAZINE  50 mg Oral BID  . imipenem-cilastatin  250 mg Intravenous Q6H  . insulin aspart  0-9 Units Subcutaneous TID WC  . insulin glargine  8 Units Subcutaneous QHS  . lidocaine  15 mL Mouth/Throat Once  . magnesium oxide  400 mg Oral Daily  . metoprolol succinate  25 mg Oral BID WC  . multivitamin  1 tablet Oral Daily  . mupirocin ointment  1 application Nasal BID  . pantoprazole  40 mg Oral Daily  . polyethylene glycol  17 g Oral BID  . predniSONE  20 mg Oral Daily  . senna-docusate  3 tablet Oral QHS  . sodium chloride  3 mL Intravenous Q12H  . terbinafine  250 mg Oral Daily  . tiotropium  18 mcg Inhalation Daily  . torsemide  40 mg Oral BID  .  triamcinolone cream   Topical TID  . valACYclovir  1,000 mg Oral TID   PRN:  ALPRAZolam, Glycerin (Adult), levalbuterol, morphine injection, nitroGLYCERIN, ondansetron (ZOFRAN) IV, ondansetron, oxyCODONE, polyvinyl alcohol, sodium chloride, traZODone  Diet:  Cardiac Activity:  Up ad lib DVT Prophylaxis:  ---  CLINICALLY SIGNIFICANT STUDIES Basic Metabolic Panel:   Recent Labs Lab 09/10/12 0543 09/11/12 0500  NA 136 138  K 3.5 4.0  CL 99 99  CO2 29 28  GLUCOSE 133* 87  BUN 63* 64*  CREATININE 2.30* 2.45*  CALCIUM 8.2* 8.3*   Liver Function Tests: No results found for this basename: AST, ALT, ALKPHOS, BILITOT, PROT, ALBUMIN,  in the last 168 hours CBC:  Recent Labs Lab 09/09/12 0500  09/11/12 0500 09/12/12 0705  WBC 19.3*  < > 20.6* 15.7*  NEUTROABS 16.8*  --   --   --   HGB 11.5*  < > 11.1* 10.7*  HCT 33.8*  < > 32.3* 32.4*  MCV 91.1  < > 91.2  93.4  PLT 347  < > 301 318  < > = values in this interval not displayed. Coagulation: No results found for this basename: LABPROT, INR,  in the last 168 hours Cardiac Enzymes: No results found for this basename: CKTOTAL, CKMB, CKMBINDEX, TROPONINI,  in the last 168 hours Urinalysis: No results found for this basename: COLORURINE, APPERANCEUR, LABSPEC, PHURINE, GLUCOSEU, HGBUR, BILIRUBINUR, KETONESUR, PROTEINUR, UROBILINOGEN, NITRITE, LEUKOCYTESUR,  in the last 168 hours Lipid Panel    Component Value Date/Time   CHOL 156 08/25/2012 1653   TRIG 94.0 08/25/2012 1653   HDL 53.60 08/25/2012 1653   CHOLHDL 3 08/25/2012 1653   VLDL 18.8 08/25/2012 1653   LDLCALC 84 08/25/2012 1653   HgbA1C  Lab Results  Component Value Date   HGBA1C 6.2* 08/27/2012    Urine Drug Screen:   No results found for this basename: labopia,  cocainscrnur,  labbenz,  amphetmu,  thcu,  labbarb    Alcohol Level: No results found for this basename: ETH,  in the last 168 hours  Ct Head Wo Contrast 09/09/2012   1.  Slight progression of atrophy and white matter disease, likely reflecting sequelae of chronic microvascular ischemia. 2.  No acute intracranial abnormality.     MRI of the brain  HAS PPM  MRA of the brain  HAS PPM  2D Echocardiogram  EF 65%, wall motion normal, moderate LVH  Carotid Doppler    CXR    EKG  atrial fibrillation, rate 90.   Therapy Recommendations home health  Physical Exam   Mental Status:  Alert, oriented, thought content appropriate. Speech slightly dysarthric Cranial Nerves:  II: Visual fields grossly normal, pupils equal, round, reactive to light and accommodation  III,IV, VI: ptosis not present, extra-ocular motions intact bilaterally  V,VII: smile symmetric, facial light touch sensation normal bilaterally  VIII: hearing normal bilaterally  IX,X: gag reflex present  XI: bilateral shoulder shrug  XII: midline tongue extension  Motor: 5/5 extremities Sensory: Pinprick and light  touch intact throughout, and stated to be the same bilaterally  Plantars:  Mute bilaterally  Cerebellar:  normal finger-to-nose, normal heel-to-shin test  ASSESSMENT [name] is a 79 y.o. male presenting with right hemi-sensory loss and slurred speech. Imaging confirms no acute infarct. Patient unable to have MRI due to PPM.  On aspirin 81 mg orally every day prior to admission. Now on no antithrombotics due to hemoptysis for secondary stroke prevention. Patient with resultant right  hemi-sensory loss and slurred speech. Work up underway.   Diabetes mellitus, hgba1c, 6.2  Paroxysmal atrial fibrillation, will need novel agent when felt safe from pulmonary point of view.  hypertension  LDL 84  Hospital day # 16  TREATMENT/PLAN  In the setting of new afib and TIA symptoms (MRI not feasible), patient would benefit from anticoagulant; however, only start when felt safe by pulmonology as hemoptysis is occurring.  Add SCD  Goal LDL < 70, on statin Patient's neurological status is now back to baseline and plan is to pursue anticoagulation with epixaban if no further hemoptysis in the next 48 hours. Will sign off at this moment. Please, call stroke team with any questions, concerns, or new neurological developments.   Wyatt Portela, MD [facility] Pager: [phone] 09/12/2012 12:06 PM  I reviewed note and agree with plan. Patient not in room when I went to evaluate patient.  Suanne Marker, MD 09/12/2012, 12:06 PM Certified in Neurology, Neurophysiology and Neuroimaging  [facility] [address], [address] [place], [place] [address] [phone]

## 2012-09-12 NOTE — Progress Notes (Signed)
Pt arms rewraped with 4x4 ace wraps, Lt hand weeping. Ambulated pt in hallway with walker, O2 4L pt tolerated well. retured to bed. No requests at this time.

## 2012-09-12 NOTE — Progress Notes (Signed)
Patient Name: [name] FRIMPONG Sr.      SUBJECTIVE: lots has happened since yesterday with imnput from CHF team, Renal and concern re bleeding at the previous suture site concerning for recurrence  He articulates understanding of the above  Weak SOB about the same  No overnight hemoptysis  Past Medical History  Diagnosis Date  . Stricture and stenosis of esophagus 12/07/2004    EGD done on 12/07/2004  . GERD (gastroesophageal reflux disease)   . Hypoxemia     With chronic respiratory failure  . Increased prostate specific antigen (PSA) velocity 02/26/2011  . DM (diabetes mellitus) 02/26/2011  . Gout 02/26/2011  . COPD (chronic obstructive pulmonary disease)     oxygen at home, 4L  . OSA (obstructive sleep apnea) 02/26/2011    CPAP  . Paroxysmal atrial fibrillation   . Depression   . Cardiac arrest 2009    while in hospital   . Hypertension   . HTN (hypertension) 02/26/2011  . Cardiac arrest 2009    while in hospital  . Lung cancer     lung s/p lobectomy x2 on RT lung  . Basal cell carcinoma of nose 02/26/2011  . ETOH abuse 01/29/2012    started drinking again - hx cardiac arrest in 2009 due to dt's with organ failure  . CKD (chronic kidney disease) 02/28/2012  . Chronic diastolic CHF (congestive heart failure)     a. EF 55-60% by echo 2013.  . Sick sinus syndrome     a. s/p [employer] pacemaker 2010.  . Bradycardia     a. Admitted 2010 with severe bradycardia, runs of paroxysmal VT.   Marland Kitchen Paroxysmal VT     a. In 2010 in setting of marked bradycardia.  Marland Kitchen CAD (coronary artery disease)     a. Nonobstructive CAD by cath 2006.  Marland Kitchen Esophageal ulcer     a. By EGD 10/2011.  Marland Kitchen Hiatal hernia     a. By EGD 10/2011.  . Valvular heart disease     a. Mild MR/mild AI/mild AS by echo 05/2011.    Scheduled Meds:  Scheduled Meds: . amiodarone  400 mg Oral BID  . arformoterol  15 mcg Nebulization BID  . atorvastatin  40 mg Oral QHS  . barrier cream  1 application Topical BID  .  Chlorhexidine Gluconate Cloth  6 each Topical Q0600  . citalopram  10 mg Oral Daily  . diltiazem  180 mg Oral BID  . diphenhydrAMINE  25 mg Oral Q6H  . docusate sodium  200 mg Oral BID  . ezetimibe  10 mg Oral Daily  . feeding supplement  237 mL Oral Q24H  . guaiFENesin  1,200 mg Oral BID  . hydrALAZINE  50 mg Oral BID  . imipenem-cilastatin  250 mg Intravenous Q6H  . insulin aspart  0-9 Units Subcutaneous TID WC  . insulin glargine  8 Units Subcutaneous QHS  . lidocaine  15 mL Mouth/Throat Once  . magnesium oxide  400 mg Oral Daily  . metoprolol succinate  25 mg Oral BID WC  . multivitamin  1 tablet Oral Daily  . mupirocin ointment  1 application Nasal BID  . pantoprazole  40 mg Oral Daily  . predniSONE  20 mg Oral Daily  . senna-docusate  3 tablet Oral QHS  . sodium chloride  3 mL Intravenous Q12H  . terbinafine  250 mg Oral Daily  . tiotropium  18 mcg Inhalation Daily  . torsemide  40 mg Oral BID  .  triamcinolone cream   Topical TID  . valACYclovir  1,000 mg Oral TID   Continuous Infusions: . sodium chloride 20 mL/hr at 09/11/12 2258    PHYSICAL EXAM Filed Vitals:   09/12/12 0200 09/12/12 0400 09/12/12 0500 09/12/12 0600  BP: 155/72 157/84    Pulse: 73 49  77  Temp:  97.8 F (36.6 C)    TempSrc:  Oral    Resp: 19 18  19   Height:      Weight:   205 lb 0.4 oz (93 kg)   SpO2: 92% 96%  97%    Well developed and nourished in no acute distress HENT normal Neck supple with JVP-7-8 coarse Regular rate and rhythm, no murmurs or gallops Abd-soft with active BS No Clubbing cyanosis upper and lower extremity edema; limbs wrapped Skin-warm and dry A & Oriented  Grossly normal sensory and motor function  TELEMETRY: Reviewed telemetry pt in  afib:    Intake/Output Summary (Last 24 hours) at 09/12/12 0756 Last data filed at 09/12/12 0636  Gross per 24 hour  Intake   1270 ml  Output   2680 ml  Net  -1410 ml    LABS: Basic Metabolic Panel:  Recent Labs Lab  09/06/12 0545 09/07/12 0640 09/08/12 0540 09/08/12 1425 09/09/12 0500 09/10/12 0543 09/11/12 0500  NA 137 139 139 135 135 136 138  K 4.0 3.3* 3.7 4.2 3.6 3.5 4.0  CL 99 102 100 98 98 99 99  CO2 30 28 29 26 29 29 28   GLUCOSE 131* 184* 127* 336* 134* 133* 87  BUN 64* 64* 61* 62* 60* 63* 64*  CREATININE 2.15* 2.23* 2.13* 2.36* 2.28* 2.30* 2.45*  CALCIUM 8.5 8.0* 8.1* 7.9* 8.2* 8.2* 8.3*   Cardiac Enzymes: No results found for this basename: CKTOTAL, CKMB, CKMBINDEX, TROPONINI,  in the last 72 hours CBC:  Recent Labs Lab 09/07/12 0640 09/08/12 0540 09/09/12 0500 09/10/12 0543 09/11/12 0500 09/12/12 0705  WBC 21.4* 19.6* 19.3* 20.9* 20.6* 15.7*  NEUTROABS  --   --  16.8*  --   --   --   HGB 11.0* 11.7* 11.5* 11.6* 11.1* 10.7*  HCT 33.2* 35.2* 33.8* 34.8* 32.3* 32.4*  MCV 93.0 92.9 91.1 91.8 91.2 93.4  PLT 337 360 347 353 301 318   PROTIME: No results found for this basename: LABPROT, INR,  in the last 72 hours Liver Function Tests: No results found for this basename: AST, ALT, ALKPHOS, BILITOT, PROT, ALBUMIN,  in the last 72 hours No results found for this basename: LIPASE, AMYLASE,  in the last 72 hours BNP: BNP (last 3 results)  Recent Labs  08/31/12 0500 09/06/12 0545 09/11/12 0500  PROBNP 7566.0* 1793.0* 1709.0*      ASSESSMENT AND PLAN:  Principal Problem:   Acute-on-chronic respiratory failure Active Problems:   CARCINOMA, LUNG, SQUAMOUS CELL   CHRONIC OBSTRUCTIVE PULMONARY DISEASE, SEVERE   HTN (hypertension)   DM (diabetes mellitus) type II controlled with renal manifestation   Atrial fibrillation   CKD (chronic kidney disease), stage III   Cellulitis of left ankle   Acute on chronic diastolic congestive heart failure   Leukocytosis   Elevation of cardiac enzymes   Hemoptysis   Pneumonia due to Gram-negative bacteria   Acute encephalopathy  Pt without hyemopysis; if without for next 48 hrs would pursue anticoagulation if ok with Pulm and  then DCCV-TEE] Consider return to [address] Continue amio  Signed, Sherryl Manges MD  09/12/2012

## 2012-09-12 NOTE — Progress Notes (Signed)
PATIENT DETAILS Name: [name] PULLIN Sr. Age: 79 y.o. Sex: male Date of Birth: 02/15/1933 Admit Date: 08/27/2012 Admitting Physician Clydia Llano, MD [id number] [name], MD  Subjective: Transferred to 2900 for respiratory distress, feels much better today. Bronchoscopy showed a lesion along his old lobectomy suture line, this is concerning for malignancy recurrence. I will transfer him back to [address]   Assessment/Plan: Acute on CHRONIC OBSTRUCTIVE PULMONARY DISEASE, SEVERE - with increased work of breathing this am. Continues on 4L of oxygen via n/c - Will transfer to stepdown and patient will likely require bi-pap and increased monitoring / RN care - Lasix infusion decreased from 20 mg / hour to 10 mg/hour by cardiology on 6/27. - strict Is and Os. These are now being tracked correctly. Although they still do no correlate with daily weights. - Cont supportive care with nebs/MDI  - prednisone decreased to 20 mg on 6/22.  slow taper  Acute on chronic diastolic heart failure - Cardiology following and managing diuresis.  Lasix 120 bid until 6/26. Changed to 20 mg/hour on 6/26, then on 6/27 changed to 10 mg/hour -CXR 6/27 with fluid overload and PNA.  BNP is stable at 1700. - strict Is&Os - these are being tracked accurately as of 6/24. - Creatinine slowly trending up (likely due to diuresis) [facility] was consulted for assistance 6/27 - Do not lie flat, keep head of bed raised. - 2 gm sodium diet, 1200 ml fluid restriction. -Heart failure team was consulted and they recommended all extremity wrapping. -Diuretics switched to Demadex 40 mg by mouth twice a day  Anasarca /Ascites /pulmonary edema  - Very difficult situation - In afib, with low albumin, not responding well to diuresis - Have requested assistance from both the CHF and Nephrology services. (6/27).  Hemoptysis -Etiology? Recurrence of underlying malignancy vs PNA -Still with fresh clots in the cup and blood mixed with  water in the suction tank. 6/27 -Hgb relatively stable (approx 11.1) -Status post a bronchoscopy 09/01/12 with no endobronchial lesions seen, only old clot seen on bronchoscopy -Consulted ENT (Dr. [name]) 6/26 who performed fiberoptic laryngoscopy and found no pathology in the upper most respiratory/gi tract. -Bronchoscopy was redone on 6/27 showed a lesion along the old suture line, concerning for recurrence.  PNA -BAL positive for ESBL E Coli -completed 9 days of levaquin-stop Levaquin, now on Primaxin. -Appreciate ID consult.  Dr. Drue Second recommends 10 - 14 days of antibiotic   Rash on buttocks,  Scrotum, lower legs, groin and feet. -Spoke with Dr. [name] (patient's Dermatologist) on the telephone.  She feels this is at least partially a disseminated fungal infection as he has had extensive fungal infections in the past and is significantly immunocompromised. -Lamisil 250 mg po qd x 14 days per Dr. Sw[place]il[place] -triamcinolone / silvadene cream TID to affected areas. -Will continue with valtrex started 6/23 for possible shingles on buttocks. -Avoid Gabapentin as it caused neurological changes.  Atrial fibrillation - Unfortunately off anti-coagulation given hemoptysis, per Dr. [name] ok to start Epixiban 2-3 days after hemoptysis resolves. -On metoprolol and Cardizem for rate control -Tolerating Amiodarone well  Dysarthia (6/25) -Resolved.  Likely the result of gabapentin initiated 6/24. D/c'd 6/25. -Neuro consulted.  CT head negative for stroke.  CKD (chronic kidney disease), stage III  - Creatinine slowly increasing likely due to diuresis. - [facility] consulted 6/27 to assist with diuresis and renal function.  Leukocytosis - WBC elevated, started to trend down. - Uncertain etiology. Multifactorial from possible  pneumonia, on steroids - Monitor  Hypertension - Controlled- continue with current medication  Diabetes - Lantus decreased to 8 units, continue with SSI-s - patient  with mild hypoglycemia this am (6/27)  CORONARY ARTERY DISEASE (nonobstructive disease 2006 catheterization)/Elevation of cardiac enzymes  -no CP and likely due to acute HF, demand ischemia from recent hypoxia and worsened renal function - Cardiology following  Cellulitis of left ankle  -has decreased rednessboth legs c/w stasis dermatitis- resolved   NSCLC s/p RULectomy and adjuvant chemo in 2006 - Does have a new lung nodule-will need close followup as an outpatient - Oncology has seen this admission, and will monitor lung nodule outpatient.  Disposition: Remain inpatient.  Transfer to [address].  DVT Prophylaxis:  SCD's  Code Status: Full code  Family Communication Spouse at bedside  Procedures:  None  CONSULTS: Cardiology Pulmonology Infectious disease. Ear Nose and throat Oncology Dermatology Nephrology Advanced Heart Failure Team   MEDICATIONS: Scheduled Meds: . amiodarone  400 mg Oral BID  . arformoterol  15 mcg Nebulization BID  . atorvastatin  40 mg Oral QHS  . barrier cream  1 application Topical BID  . Chlorhexidine Gluconate Cloth  6 each Topical Q0600  . citalopram  10 mg Oral Daily  . diltiazem  180 mg Oral BID  . diphenhydrAMINE  25 mg Oral Q6H  . docusate sodium  200 mg Oral BID  . ezetimibe  10 mg Oral Daily  . feeding supplement  237 mL Oral Q24H  . guaiFENesin  1,200 mg Oral BID  . hydrALAZINE  50 mg Oral BID  . imipenem-cilastatin  250 mg Intravenous Q6H  . insulin aspart  0-9 Units Subcutaneous TID WC  . insulin glargine  8 Units Subcutaneous QHS  . lidocaine  15 mL Mouth/Throat Once  . magnesium oxide  400 mg Oral Daily  . metoprolol succinate  25 mg Oral BID WC  . multivitamin  1 tablet Oral Daily  . mupirocin ointment  1 application Nasal BID  . pantoprazole  40 mg Oral Daily  . predniSONE  20 mg Oral Daily  . senna-docusate  3 tablet Oral QHS  . sodium chloride  3 mL Intravenous Q12H  . terbinafine  250 mg Oral Daily  . tiotropium   18 mcg Inhalation Daily  . torsemide  40 mg Oral BID  . triamcinolone cream   Topical TID  . valACYclovir  1,000 mg Oral TID   Continuous Infusions: . sodium chloride 20 mL/hr at 09/11/12 2258   PRN Meds:.ALPRAZolam, Glycerin (Adult), levalbuterol, morphine injection, nitroGLYCERIN, ondansetron (ZOFRAN) IV, ondansetron, oxyCODONE, polyvinyl alcohol, sodium chloride, traZODone  Antibiotics: Anti-infectives   Start     Dose/Rate Route Frequency Ordered Stop   09/11/12 1000  terbinafine (LAMISIL) tablet 250 mg     250 mg Oral Daily 09/11/12 0724 09/25/12 0959   09/08/12 1100  fluconazole (DIFLUCAN) tablet 100 mg  Status:  Discontinued     100 mg Oral Daily 09/08/12 1013 09/11/12 0724   09/07/12 1600  valACYclovir (VALTREX) tablet 1,000 mg     1,000 mg Oral 3 times daily 09/07/12 1303 09/14/12 1559   09/04/12 1200  imipenem-cilastatin (PRIMAXIN) 250 mg in sodium chloride 0.9 % 100 mL IVPB     250 mg 200 mL/hr over 30 Minutes Intravenous 4 times per day 09/04/12 0856     08/29/12 1200  levofloxacin (LEVAQUIN) tablet 500 mg  Status:  Discontinued     500 mg Oral Every 48 hours 08/29/12 0957  09/04/12 1008   08/27/12 0900  vancomycin (VANCOCIN) 1,250 mg in sodium chloride 0.9 % 250 mL IVPB  Status:  Discontinued     1,250 mg 166.7 mL/hr over 90 Minutes Intravenous Every 24 hours 08/27/12 0826 08/28/12 1809   08/27/12 0900  piperacillin-tazobactam (ZOSYN) IVPB 3.375 g  Status:  Discontinued     3.375 g 12.5 mL/hr over 240 Minutes Intravenous 3 times per day 08/27/12 0826 08/28/12 1809   08/27/12 0600  levofloxacin (LEVAQUIN) IVPB 500 mg     500 mg 100 mL/hr over 60 Minutes Intravenous  Once 08/27/12 0554 08/27/12 0735       PHYSICAL EXAM: Vital signs in last 24 hours: Filed Vitals:   09/12/12 0801 09/12/12 0847 09/12/12 1006 09/12/12 1007  BP:  160/96 155/73 155/73  Pulse:  85    Temp: 98.4 F (36.9 C)     TempSrc: Oral     Resp:      Height:      Weight:      SpO2:         Weight change: -2.5 kg (-5 lb 8.2 oz) Filed Weights   09/10/12 0500 09/11/12 0651 09/12/12 0500  Weight: 93.4 kg (205 lb 14.6 oz) 95.5 kg (210 lb 8.6 oz) 93 kg (205 lb 0.4 oz)   Body mass index is 32.1 kg/(m^2).   Gen Exam: Awake and alert, appears tired, mild resp distress. Always pleasant Neck: Supple, No JVD.   Chest: minimal crackles, poor air movement. Mildly increased work of breathing CVS: S1 S2 Regular, no murmurs.  Abdomen: soft, BS +, non tender, non distended.  Extremities 2-3+ edema on dorsum of feet bilaterally, lower extremities warm to touch.  Neurologic:  No acute changes, Non focal Skin: multiple areas of rash.  Erythematous scale across his buttocks and scrotum.  Similar appearing rash on his distal lower extremities.   Plantar portion of feet are decreasing in redness (confluent erythema), and groin is red as well w/o scale - shows improvement over 6/25.   Intake/Output from previous day:  Intake/Output Summary (Last 24 hours) at 09/12/12 1124 Last data filed at 09/12/12 0900  Gross per 24 hour  Intake   1330 ml  Output   2680 ml  Net  -1350 ml     LAB RESULTS: CBC  Recent Labs Lab 09/08/12 0540 09/09/12 0500 09/10/12 0543 09/11/12 0500 09/12/12 0705  WBC 19.6* 19.3* 20.9* 20.6* 15.7*  HGB 11.7* 11.5* 11.6* 11.1* 10.7*  HCT 35.2* 33.8* 34.8* 32.3* 32.4*  PLT 360 347 353 301 318  MCV 92.9 91.1 91.8 91.2 93.4  MCH 30.9 31.0 30.6 31.4 30.8  MCHC 33.2 34.0 33.3 34.4 33.0  RDW 15.5 15.5 15.6* 15.7* 15.6*  LYMPHSABS  --  1.5  --   --   --   MONOABS  --  0.9  --   --   --   EOSABS  --  0.0  --   --   --   BASOSABS  --  0.0  --   --   --     Chemistries   Recent Labs Lab 09/08/12 0540 09/08/12 1425 09/09/12 0500 09/10/12 0543 09/11/12 0500  NA 139 135 135 136 138  K 3.7 4.2 3.6 3.5 4.0  CL 100 98 98 99 99  CO2 29 26 29 29 28   GLUCOSE 127* 336* 134* 133* 87  BUN 61* 62* 60* 63* 64*  CREATININE 2.13* 2.36* 2.28* 2.30* 2.45*  CALCIUM  8.1* 7.9* 8.2*  8.2* 8.3*    CBG:  Recent Labs Lab 09/11/12 0732 09/11/12 0917 09/11/12 1724 09/11/12 2134 09/12/12 0811  GLUCAP 69* 108* 92 217* 126*    Urine Studies MICROBIOLOGY: Recent Results (from the past 240 hour(s))  MRSA PCR SCREENING     Status: Abnormal   Collection Time    09/11/12  2:06 PM      Result Value Range Status   MRSA by PCR POSITIVE (*) NEGATIVE Final   Comment:            The GeneXpert MRSA Assay (FDA     approved for NASAL specimens     only), is one component of a     comprehensive MRSA colonization     surveillance program. It is not     intended to diagnose MRSA     infection nor to guide or     monitor treatment for     MRSA infections.     RESULT CALLED TO, READ BACK BY AND VERIFIED WITH:     MILLER RN 15:40 09/11/12 ([email or web address])    RADIOLOGY STUDIES/RESULTS: Dg Chest 2 View  09/01/2012   *RADIOLOGY REPORT*  Clinical Data: Shortness of breath.  CHEST - 2 VIEW  Comparison: 08/31/2012.  Findings: The left subclavian Port-A-Cath tip it is most likely in the mid SVC at the level of the carina.  Stable pacer wires.  The heart is enlarged but unchanged.  Persistent bilateral infiltrates and effusions.  IMPRESSION:  1.  The Port-A-Cath tip appears to be in the mid SVC. 2.  Persistent bilateral infiltrates and small effusions.   Original Report Authenticated By: Rudie Meyer, [name]   Ct Chest Wo Contrast  08/30/2012   *RADIOLOGY REPORT*  Clinical Data: Hemoptysis.  Pneumonia versus edema.  CT CHEST WITHOUT CONTRAST  Technique:  Multidetector CT imaging of the chest was performed following the standard protocol without IV contrast.  Comparison: Chest radiograph 08/30/2012 and chest CT 02/05/2012  Findings: Heart is enlarged.  Cardiomegaly appears stable. Stable prominent fat density, consistent with lipoma, between the left and right atria.  Dual lead pacer is present.  There is heavy coronary artery atherosclerotic calcification of all three coronary  arteries.  Aortic valvular calcifications are present.  A left IJ central venous catheter terminates in the superior vena cava. Thyroid gland and thoracic inlet appear within normal limits.  Heavy atherosclerotic calcification of the thoracic aorta, without aneurysm.  Several mediastinal lymph nodes are visualized, and within normal limits for size.  No pathologically enlarged lymph nodes are seen in the thorax.  Evaluation for the hilar lymph nodes is somewhat limited without intravenous contrast.  Small bilateral pleural effusions are present.  Multilevel degenerative changes of the thoracic spine.  Multiple benign hemangiomas in the thoracic spine vertebral bodies.  No acute or suspicious osseous abnormality.  No acute findings are identified in the abdomen.  Surgical clips are seen in the left upper quadrant.  There are postsurgical changes of the right lower lobectomy.  There is a background of centrilobular emphysema.  Stable scarring in the right upper lobe anteriorly.  No definite intralobular septal thickening.  There is markedly asymmetric airspace disease in the inferior and posterior aspect of the right upper lobe, where there are areas of consolidation.  There are ground-glass opacities which are new compared to prior chest CT in the left upper lobe, left lower lobe, and right middle lobe.  5 mm pulmonary nodule in the left upper lobe peripherally on image #27 of  the lung windows is new.  IMPRESSION:  1.  Markedly asymmetric bilateral airspace disease.  Airspace disease is most prominent, with areas of consolidation, in the posterior and inferior right upper lobe ( the patient has a history of right lower lobectomy).  Findings in the right upper lobe are favored to be due to pneumonia or aspiration. 2.  New ground-glass opacities in the left upper lobe and left lower lobe may be due to early infection.  Underlying mild pulmonary edema cannot be excluded in this patient with cardiomegaly and small  bilateral pleural effusions. 3.  New 5 mm pulmonary nodule left upper lobe. If the patient is at high risk for bronchogenic carcinoma, follow-up chest CT at 6-12 months is recommended.  If the patient is at low risk for bronchogenic carcinoma, follow-up chest CT at 12 months is recommended.  This recommendation follows the consensus statement: Guidelines for Management of Small Pulmonary Nodules Detected on CT Scans: A Statement from the Fleischner Society as published in Radiology 2005; 237:395-400. 4.  Cardiomegaly with three-vessel coronary artery disease and dual lead cardiac pacer. 5.  Small bilateral pleural effusions 6.  Aortic valve calcifications. This finding raises the possibility of aortic valvular stenosis.  .   Original Report Authenticated By: Britta Mccreedy, [name]   Dg Chest Port 1 View  08/31/2012   *RADIOLOGY REPORT*  Clinical Data: Follow-up edema  PORTABLE CHEST - 1 VIEW  Comparison: Yesterday  Findings: The tip of the left subclavian Port-A-Cath has changed its orientation.  Based on a single view, it is likely in the azygos vein.  Right subclavian dual lead pacemaker device and leads are stable and intact.  Bilateral pleural effusions left greater than right are stable.  Diffuse edema is stable. Cardiomegaly.  IMPRESSION: Left subclavian Port-A-Cath tip has changed its position and is likely in the azygos vein.  This can be confirmed with a lateral view.  Stable airspace disease and pleural effusions.   Original Report Authenticated By: Jolaine Click, [name]   Dg Chest Port 1 View  08/30/2012   *RADIOLOGY REPORT*  Clinical Data: Respiratory failure.  PORTABLE CHEST - 1 VIEW  Comparison: 08/29/2012 and 08/27/2012  Findings: Prominent interstitial markings could represent underlying edema.  There are persistent interstitial and airspace densities in the right mid and lower lung region.  Heart size remains enlarged.  Again noted is a right dual lead cardiac pacemaker.  Left subclavian Port-A-Cath  in the SVC region.  Aortic arch is heavily calcified.  IMPRESSION:  Prominent interstitial markings may represent underlying edema. Again noted are increased densities in the right mid and lower lung region which could represent asymmetric edema but cannot exclude an infectious etiology.   Original Report Authenticated By: Richarda Overlie, [name]   Dg Chest Port 1 View  08/29/2012   *RADIOLOGY REPORT*  Clinical Data: Follow-up CHF  PORTABLE CHEST - 1 VIEW  Comparison: Prior chest x-ray 08/27/2012  Findings: Stable position of left subclavian central venous catheter with the tip in the mid superior vena cava.  Right subclavian approach cardiac rhythm maintenance device with leads in the right atrium and right ventricle.  Slightly improved inspiratory volumes with clearing in the right base.  Stable elevation of the left hemidiaphragm resulting in blunting of the left costophrenic angle.  Decreasing interstitial edema with the residual asymmetric patchy opacity in the right mid and lower lobe. Small residual right pleural effusion.  Stable cardiomegaly. Aortic atherosclerosis again noted.  No pneumothorax.  IMPRESSION:  1.  Decreasing  pulmonary edema with improved inspiratory volumes and clearing in the right base. 2.  Residual asymmetric patchy opacity in the right mid and lower lung may reflect residual asymmetric alveolar edema, or pneumonia. 3.  Persistent small right pleural effusion, chronic elevation of the left hemidiaphragm and cardiomegaly.   Original Report Authenticated By: Malachy Moan, [name]   Dg Chest Portable 1 View  08/27/2012   *RADIOLOGY REPORT*  Clinical Data: Shortness of breath.  History of lung cancer post lobectomy.  PORTABLE CHEST - 1 VIEW  Comparison: 02/02/2012  Findings: Shallow inspiration.  Cardiac enlargement with pulmonary vascular congestion and perihilar edema.  Bilateral pleural effusions.  Atelectasis in the lung bases.  No pneumothorax. Changes have developed since the previous  study.  Postoperative changes in the right chest.  Stable appearance of cardiac pacemaker and left central venous catheter.  Calcified and tortuous aorta.  IMPRESSION: Interval development of cardiac enlargement, pulmonary vascular congestion, perihilar edema, and bilateral pleural effusions.   Original Report Authenticated By: Burman Nieves, [name]    [facility] Pager:[phone]  If 7PM-7AM, please contact night-coverage [email or web address] Password [email or web address] 09/12/2012, 11:24 AM   LOS: 16 days

## 2012-09-12 NOTE — Progress Notes (Signed)
Assessment:  1 Stage 4 CKD due to Diabetes mellitus  2 Volume overload  Plan:  1 Will discontinue PO diuretics and change to iv 2 We will follow, check s Albumin  Subjective: Interval History: family concerned about weeping of fluid from arms.  He denies genital edema. Urine output remains poor or poorly recorded.  Objective: Vital signs in last 24 hours: Temp:  [97.8 F (36.6 C)-98.4 F (36.9 C)] 98.2 F (36.8 C) (06/28 1528) Pulse Rate:  [49-87] 74 (06/28 1528) Resp:  [17-23] 18 (06/28 1528) BP: (139-161)/(67-105) 139/72 mmHg (06/28 1528) SpO2:  [90 %-99 %] 98 % (06/28 1528) Weight:  [93 kg (205 lb 0.4 oz)] 93 kg (205 lb 0.4 oz) (06/28 0500) Weight change: -2.5 kg (-5 lb 8.2 oz)  Intake/Output from previous day: 06/27 0701 - 06/28 0700 In: 1290 [P.O.:720; I.V.:270; IV Piggyback:300] Out: 2680 [Urine:2680] Intake/Output this shift: Total I/O In: 240 [P.O.:240] Out: -   General appearance: alert and cooperative Extremities: edema arms and legs wrapped, hands swollen s alb 2.8 on 6/16 Lab Results:  Recent Labs  09/11/12 0500 09/12/12 0705  WBC 20.6* 15.7*  HGB 11.1* 10.7*  HCT 32.3* 32.4*  PLT 301 318   BMET:  Recent Labs  09/10/12 0543 09/11/12 0500  NA 136 138  K 3.5 4.0  CL 99 99  CO2 29 28  GLUCOSE 133* 87  BUN 63* 64*  CREATININE 2.30* 2.45*  CALCIUM 8.2* 8.3*   No results found for this basename: PTH,  in the last 72 hours Iron Studies: No results found for this basename: IRON, TIBC, TRANSFERRIN, FERRITIN,  in the last 72 hours Studies/Results: Ct Head Wo Contrast  09/12/2012   *RADIOLOGY REPORT*  Clinical Data: Evolving cerebral infarct. New onset atrial fibrillation and TIA symptoms.  CT HEAD WITHOUT CONTRAST  Technique:  Contiguous axial images were obtained from the base of the skull through the vertex without contrast.  Comparison: CT head without contrast 09/09/2012  Findings: Mild generalized atrophy and white matter disease is present.  The  ventricles are proportionate to the degree of atrophy.  No acute cortical infarct, hemorrhage, or mass lesion is present.  The paranasal sinuses and mastoid air cells are clear.  The osseous skull is intact.  IMPRESSION:  1.  No acute intracranial abnormality or significant interval change. 2.  Mild atrophy white matter disease is not significantly changed from the most recent examination.   Original Report Authenticated By: Marin Roberts, [name]   Dg Chest Port 1v Same Day  09/11/2012   *RADIOLOGY REPORT*  Clinical Data: 79 year old male shortness of breath.  Lung cancer. Hemoptysis.  PORTABLE CHEST - 1 VIEW SAME DAY  Comparison: Chest CT 09/08/2012 and earlier.  Findings: AP portable semi upright view at 0936 hours.  Stable right chest cardiac pacemaker and left chest Port-A-Cath which is currently accessed.  Kyphotic view.  Stable cardiac size and mediastinal contours. Bilateral pleural blunting and is stable, without evidence of significant effusion on the recent CT. Bibasilar increased opacity has not significantly changed.  No pneumothorax.  Patchy right perihilar opacity is mildly less confluent than on 09/01/2012.  IMPRESSION: Little change since 09/01/2012.  Bibasilar opacity with pleural blunting (but no effusion on the recent CT) and patchy right perihilar opacity.   Original Report Authenticated By: Erskine Speed, [name]    Scheduled: . amiodarone  400 mg Oral BID  . arformoterol  15 mcg Nebulization BID  . atorvastatin  40 mg Oral QHS  .  barrier cream  1 application Topical BID  . Chlorhexidine Gluconate Cloth  6 each Topical Q0600  . citalopram  10 mg Oral Daily  . diltiazem  180 mg Oral BID  . diphenhydrAMINE  25 mg Oral Q6H  . docusate sodium  200 mg Oral BID  . ezetimibe  10 mg Oral Daily  . feeding supplement  237 mL Oral Q24H  . guaiFENesin  1,200 mg Oral BID  . hydrALAZINE  50 mg Oral BID  . imipenem-cilastatin  250 mg Intravenous Q6H  . insulin aspart  0-9 Units Subcutaneous  TID WC  . insulin glargine  8 Units Subcutaneous QHS  . lidocaine  15 mL Mouth/Throat Once  . magnesium oxide  400 mg Oral Daily  . metoprolol succinate  25 mg Oral BID WC  . multivitamin  1 tablet Oral Daily  . mupirocin ointment  1 application Nasal BID  . pantoprazole  40 mg Oral Daily  . polyethylene glycol  17 g Oral BID  . predniSONE  20 mg Oral Daily  . senna-docusate  3 tablet Oral QHS  . sodium chloride  3 mL Intravenous Q12H  . terbinafine  250 mg Oral Daily  . tiotropium  18 mcg Inhalation Daily  . torsemide  40 mg Oral BID  . triamcinolone cream   Topical TID  . valACYclovir  1,000 mg Oral TID      LOS: 16 days   Nolah Krenzer C 09/12/2012,7:44 PM

## 2012-09-13 LAB — RENAL FUNCTION PANEL
Albumin: 2.5 g/dL — ABNORMAL LOW (ref 3.5–5.2)
Chloride: 95 mEq/L — ABNORMAL LOW (ref 96–112)
Creatinine, Ser: 2.19 mg/dL — ABNORMAL HIGH (ref 0.50–1.35)
GFR calc non Af Amer: 27 mL/min — ABNORMAL LOW (ref >90)
Phosphorus: 3.7 mg/dL (ref 2.3–4.6)
Potassium: 3.3 mEq/L — ABNORMAL LOW (ref 3.5–5.1)

## 2012-09-13 LAB — CBC
Hemoglobin: 11.4 g/dL — ABNORMAL LOW (ref 13.0–17.0)
MCHC: 33.6 g/dL (ref 30.0–36.0)
RBC: 3.67 MIL/uL — ABNORMAL LOW (ref 4.22–5.81)

## 2012-09-13 LAB — GLUCOSE, CAPILLARY
Glucose-Capillary: 126 mg/dL — ABNORMAL HIGH (ref 70–99)
Glucose-Capillary: 165 mg/dL — ABNORMAL HIGH (ref 70–99)
Glucose-Capillary: 175 mg/dL — ABNORMAL HIGH (ref 70–99)
Glucose-Capillary: 177 mg/dL — ABNORMAL HIGH (ref 70–99)

## 2012-09-13 MED ORDER — FLUCONAZOLE 100 MG PO TABS
100.0000 mg | ORAL_TABLET | Freq: Every day | ORAL | Status: DC
  Administered 2012-09-13 – 2012-09-16 (×4): 100 mg via ORAL
  Filled 2012-09-13 (×4): qty 1

## 2012-09-13 NOTE — Progress Notes (Signed)
Pt had one episode of hemoptysis with fresh clots on a cup. Pt is resting comfortable on bed using his Bpap, wife is at the bedside no distress noticed. Pt started on IV lasix 160 mg as ordered by MD. Maryclare Labrador continue with POC.

## 2012-09-13 NOTE — Progress Notes (Addendum)
Patient Name: [name] MCNIEL Sr.      SUBJECTIVE: lots has happened since yesterday with imnput from CHF team, Renal and concern re bleeding at the previous suture site concerning for recurrence  Yesterdays transient neurological event note read and distrubing but i have spoke to neurology-- he was seen on routine followup and found to be neurologically normal  Not sure why CT done yesterday    Weak SOB imporved  Feels stronger and walked yesterday  More hemoptysis overnight    Past Medical History  Diagnosis Date  . Stricture and stenosis of esophagus 12/07/2004    EGD done on 12/07/2004  . GERD (gastroesophageal reflux disease)   . Hypoxemia     With chronic respiratory failure  . Increased prostate specific antigen (PSA) velocity 02/26/2011  . DM (diabetes mellitus) 02/26/2011  . Gout 02/26/2011  . COPD (chronic obstructive pulmonary disease)     oxygen at home, 4L  . OSA (obstructive sleep apnea) 02/26/2011    CPAP  . Paroxysmal atrial fibrillation   . Depression   . Cardiac arrest 2009    while in hospital   . Hypertension   . HTN (hypertension) 02/26/2011  . Cardiac arrest 2009    while in hospital  . Lung cancer     lung s/p lobectomy x2 on RT lung  . Basal cell carcinoma of nose 02/26/2011  . ETOH abuse 01/29/2012    started drinking again - hx cardiac arrest in 2009 due to dt's with organ failure  . CKD (chronic kidney disease) 02/28/2012  . Chronic diastolic CHF (congestive heart failure)     a. EF 55-60% by echo 2013.  . Sick sinus syndrome     a. s/p [employer] pacemaker 2010.  . Bradycardia     a. Admitted 2010 with severe bradycardia, runs of paroxysmal VT.   Marland Kitchen Paroxysmal VT     a. In 2010 in setting of marked bradycardia.  Marland Kitchen CAD (coronary artery disease)     a. Nonobstructive CAD by cath 2006.  Marland Kitchen Esophageal ulcer     a. By EGD 10/2011.  Marland Kitchen Hiatal hernia     a. By EGD 10/2011.  . Valvular heart disease     a. Mild MR/mild AI/mild AS by echo 05/2011.      Scheduled Meds:  Scheduled Meds: . amiodarone  400 mg Oral BID  . arformoterol  15 mcg Nebulization BID  . atorvastatin  40 mg Oral QHS  . barrier cream  1 application Topical BID  . Chlorhexidine Gluconate Cloth  6 each Topical Q0600  . citalopram  10 mg Oral Daily  . diltiazem  180 mg Oral BID  . diphenhydrAMINE  25 mg Oral Q6H  . docusate sodium  200 mg Oral BID  . ezetimibe  10 mg Oral Daily  . feeding supplement  237 mL Oral Q24H  . furosemide  160 mg Intravenous Q8H  . guaiFENesin  1,200 mg Oral BID  . hydrALAZINE  50 mg Oral BID  . imipenem-cilastatin  250 mg Intravenous Q6H  . insulin aspart  0-9 Units Subcutaneous TID WC  . insulin glargine  8 Units Subcutaneous QHS  . lidocaine  15 mL Mouth/Throat Once  . magnesium oxide  400 mg Oral Daily  . metolazone  5 mg Oral BID  . metoprolol succinate  25 mg Oral BID WC  . multivitamin  1 tablet Oral Daily  . mupirocin ointment  1 application Nasal BID  . pantoprazole  40  mg Oral Daily  . polyethylene glycol  17 g Oral BID  . predniSONE  20 mg Oral Daily  . senna-docusate  3 tablet Oral QHS  . sodium chloride  3 mL Intravenous Q12H  . terbinafine  250 mg Oral Daily  . tiotropium  18 mcg Inhalation Daily  . triamcinolone cream   Topical TID  . valACYclovir  1,000 mg Oral TID   Continuous Infusions: . sodium chloride 20 mL/hr at 09/11/12 2258    PHYSICAL EXAM Filed Vitals:   09/12/12 2205 09/13/12 0521 09/13/12 0752 09/13/12 0958  BP: 140/76 137/80  150/80  Pulse: 80 52  66  Temp: 98.2 F (36.8 C) 98.2 F (36.8 C)    TempSrc: Oral Axillary    Resp: 18 18    Height:      Weight:  203 lb 11.3 oz (92.4 kg)    SpO2: 97% 92% 94% 97%    Well developed and nourished in no acute distress HENT normal Neck supple with [id number] w v wave coarse Regular rate and rhythm, no murmurs or gallops Abd-soft with active BS No Clubbing cyanosis upper and lower extremity edema; limbs wrapped Skin-warm and dry A & Oriented   Grossly normal sensory and motor function  TELEMETRY: Reviewed telemetry pt in  afib:    Intake/Output Summary (Last 24 hours) at 09/13/12 1059 Last data filed at 09/13/12 0938  Gross per 24 hour  Intake   1680 ml  Output   2500 ml  Net   -820 ml    LABS: Basic Metabolic Panel:  Recent Labs Lab 09/07/12 0640 09/08/12 0540 09/08/12 1425 09/09/12 0500 09/10/12 0543 09/11/12 0500 09/13/12 0510  NA 139 139 135 135 136 138 136  K 3.3* 3.7 4.2 3.6 3.5 4.0 3.3*  CL 102 100 98 98 99 99 95*  CO2 28 29 26 29 29 28  33*  GLUCOSE 184* 127* 336* 134* 133* 87 154*  BUN 64* 61* 62* 60* 63* 64* 55*  CREATININE 2.23* 2.13* 2.36* 2.28* 2.30* 2.45* 2.19*  CALCIUM 8.0* 8.1* 7.9* 8.2* 8.2* 8.3* 8.3*  PHOS  --   --   --   --   --   --  3.7   Cardiac Enzymes: No results found for this basename: CKTOTAL, CKMB, CKMBINDEX, TROPONINI,  in the last 72 hours CBC:  Recent Labs Lab 09/07/12 0640 09/08/12 0540 09/09/12 0500 09/10/12 0543 09/11/12 0500 09/12/12 0705 09/13/12 0510  WBC 21.4* 19.6* 19.3* 20.9* 20.6* 15.7* 18.1*  NEUTROABS  --   --  16.8*  --   --   --   --   HGB 11.0* 11.7* 11.5* 11.6* 11.1* 10.7* 11.4*  HCT 33.2* 35.2* 33.8* 34.8* 32.3* 32.4* 33.9*  MCV 93.0 92.9 91.1 91.8 91.2 93.4 92.4  PLT 337 360 347 353 301 318 335   PROTIME: No results found for this basename: LABPROT, INR,  in the last 72 hours Liver Function Tests:  Recent Labs  09/13/12 0510  ALBUMIN 2.5*   No results found for this basename: LIPASE, AMYLASE,  in the last 72 hours BNP: BNP (last 3 results)  Recent Labs  08/31/12 0500 09/06/12 0545 09/11/12 0500  PROBNP 7566.0* 1793.0* 1709.0*      ASSESSMENT AND PLAN:  Principal Problem:   Acute-on-chronic respiratory failure Active Problems:   CARCINOMA, LUNG, SQUAMOUS CELL   CHRONIC OBSTRUCTIVE PULMONARY DISEASE, SEVERE   HTN (hypertension)   DM (diabetes mellitus) type II controlled with renal manifestation   Atrial fibrillation  CKD  (chronic kidney disease), stage III   Cellulitis of left ankle   Acute on chronic diastolic congestive heart failure   Leukocytosis   Elevation of cardiac enzymes   Hemoptysis   Pneumonia due to Gram-negative bacteria   Acute encephalopathy  Recurrent  Hemoptysis; i think we are at the point of abandoning the idea of anitcoagulation and cardioversion   Will see what pulm says in am  Consider return to 5[address] 5W Continue amio-- will need to make decision re amio also if we are not going to cardiovert  Renal function stable/improved    Signed, Sherryl Manges MD  09/13/2012

## 2012-09-13 NOTE — Progress Notes (Signed)
ANTIBIOTIC CONSULT NOTE   Pharmacy Consult for Primaxin Indication: ESBL Pneumonia  No Known Allergies   Labs:  Recent Labs  09/11/12 0500 09/12/12 0705 09/13/12 0510  WBC 20.6* 15.7* 18.1*  HGB 11.1* 10.7* 11.4*  PLT 301 318 335  CREATININE 2.45*  --  2.19*    Assessment: 23 YOM with chronic respiratory failure d/t COPD/CHF, who had completed 9 days of Levaquin for pneumonia, BAL culture (6/17) is positive for ESBL E.Coli.  He is afebrile, wbc elevated at 18.1, scr 2.19, est. crcl ~ 30 ml/min  Day 10 of Primaxin therapy  Goal of Therapy:  Appropriate Primaxin dosing  Plan:  -Continue  Primaxin 250 mg iv q 6 hrs - f/u renal function - Restart Xarelto when appropriate  Thank you. Link Snuffer, PharmD, BCPS Clinical Pharmacist [phone]  09/13/2012,12:56 PM

## 2012-09-13 NOTE — Progress Notes (Signed)
PATIENT DETAILS Name: [name] BORGERDING Sr. Age: 79 y.o. Sex: male Date of Birth: 1933/02/08 Admit Date: 08/27/2012 Admitting Physician Clydia Llano, MD WUJ:[id number] [name], MD  Subjective: Back on telemetry, still has hemoptysis. Bronchoscopic biopsy pathology is pending  Assessment/Plan: Acute on CHRONIC OBSTRUCTIVE PULMONARY DISEASE, SEVERE - with increased work of breathing this am. Continues on 4L of oxygen via n/c - Will transfer to stepdown and patient will likely require bi-pap and increased monitoring / RN care - Lasix infusion decreased from 20 mg / hour to 10 mg/hour by cardiology on 6/27. - strict Is and Os. These are now being tracked correctly. Although they still do no correlate with daily weights. - Cont supportive care with nebs/MDI  - prednisone decreased to 20 mg on 6/22.  slow taper  Acute on chronic diastolic heart failure - Cardiology following and managing diuresis.  Lasix 120 bid until 6/26. Changed to 20 mg/hour on 6/26, then on 6/27 changed to 10 mg/hour -CXR 6/27 with fluid overload and PNA.  BNP is stable at 1700. - strict Is&Os - these are being tracked accurately as of 6/24. - Creatinine slowly trending up (likely due to diuresis) [facility] was consulted for assistance 6/27 - Do not lie flat, keep head of bed raised. - 2 gm sodium diet, 1200 ml fluid restriction. -Heart failure team was consulted and they recommended all extremity wrapping. -Diuretics switched to Demadex 40 mg by mouth twice a day  Anasarca /Ascites /pulmonary edema  - Very difficult situation - In afib, with low albumin, not responding well to diuresis - Have requested assistance from both the CHF and Nephrology services. (6/27).  Hemoptysis -Etiology? Recurrence of underlying malignancy vs PNA -Still with fresh clots in the cup and blood mixed with water in the suction tank. 6/27 -Hgb relatively stable (approx 11.1) -Status post a bronchoscopy 09/01/12 with no endobronchial lesions  seen, only old clot seen on bronchoscopy -Consulted ENT (Dr. [name]) 6/26 who performed fiberoptic laryngoscopy and found no pathology in the upper most respiratory/gi tract. -Bronchoscopy was redone on 6/27 showed a lesion along the old suture line, concerning for recurrence.  PNA -BAL positive for ESBL E Coli and Candida albicans -completed 9 days of levaquin-stop Levaquin, now on Primaxin. I added Diflucan. -Appreciate ID consult.  Dr. Drue Second recommends 10 - 14 days of antibiotic   Rash on buttocks,  Scrotum, lower legs, groin and feet. -Spoke with Dr. [name] (patient's Dermatologist) on the telephone.  She feels this is at least partially a disseminated fungal infection as he has had extensive fungal infections in the past and is significantly immunocompromised. -Lamisil 250 mg po qd x 14 days per Dr. Sw[place]and -triamcinolone / silvadene cream TID to affected areas. -Will continue with valtrex started 6/23 for possible shingles on buttocks. -Avoid Gabapentin as it caused neurological changes.  Atrial fibrillation - Unfortunately off anti-coagulation given hemoptysis, per Dr. [name] ok to start Epixiban 2-3 days after hemoptysis resolves. -On metoprolol and Cardizem for rate control -Tolerating Amiodarone well  Dysarthia (6/25) -Resolved.  Likely the result of gabapentin initiated 6/24. D/c'd 6/25. -Neuro consulted.  CT head negative for stroke.  CKD (chronic kidney disease), stage III  - Creatinine slowly increasing likely due to diuresis. - [facility] consulted 6/27 to assist with diuresis and renal function.  Leukocytosis - WBC elevated, started to trend down. - Uncertain etiology. Multifactorial from possible pneumonia, on steroids - Monitor  Hypertension - Controlled- continue with current medication  Diabetes - Lantus decreased  to 8 units, continue with SSI-s - patient with mild hypoglycemia this am (6/27)  CORONARY ARTERY DISEASE (nonobstructive disease 2006  catheterization)/Elevation of cardiac enzymes  -no CP and likely due to acute HF, demand ischemia from recent hypoxia and worsened renal function - Cardiology following  Cellulitis of left ankle  -has decreased rednessboth legs c/w stasis dermatitis- resolved   NSCLC s/p RULectomy and adjuvant chemo in 2006 - Does have a new lung nodule-will need close followup as an outpatient - Oncology has seen this admission, and will monitor lung nodule outpatient.  Disposition: Remain inpatient.  Transfer to 5 W.  DVT Prophylaxis:  SCD's  Code Status: Full code  Family Communication Spouse at bedside  Procedures:  None  CONSULTS: Cardiology Pulmonology Infectious disease. Ear Nose and throat Oncology Dermatology Nephrology Advanced Heart Failure Team   MEDICATIONS: Scheduled Meds: . amiodarone  400 mg Oral BID  . arformoterol  15 mcg Nebulization BID  . atorvastatin  40 mg Oral QHS  . barrier cream  1 application Topical BID  . Chlorhexidine Gluconate Cloth  6 each Topical Q0600  . citalopram  10 mg Oral Daily  . diltiazem  180 mg Oral BID  . diphenhydrAMINE  25 mg Oral Q6H  . docusate sodium  200 mg Oral BID  . ezetimibe  10 mg Oral Daily  . feeding supplement  237 mL Oral Q24H  . furosemide  160 mg Intravenous Q8H  . guaiFENesin  1,200 mg Oral BID  . hydrALAZINE  50 mg Oral BID  . imipenem-cilastatin  250 mg Intravenous Q6H  . insulin aspart  0-9 Units Subcutaneous TID WC  . insulin glargine  8 Units Subcutaneous QHS  . lidocaine  15 mL Mouth/Throat Once  . magnesium oxide  400 mg Oral Daily  . metolazone  5 mg Oral BID  . metoprolol succinate  25 mg Oral BID WC  . multivitamin  1 tablet Oral Daily  . mupirocin ointment  1 application Nasal BID  . pantoprazole  40 mg Oral Daily  . polyethylene glycol  17 g Oral BID  . predniSONE  20 mg Oral Daily  . senna-docusate  3 tablet Oral QHS  . sodium chloride  3 mL Intravenous Q12H  . terbinafine  250 mg Oral Daily   . tiotropium  18 mcg Inhalation Daily  . triamcinolone cream   Topical TID  . valACYclovir  1,000 mg Oral TID   Continuous Infusions: . sodium chloride 20 mL/hr at 09/11/12 2258   PRN Meds:.ALPRAZolam, Glycerin (Adult), levalbuterol, morphine injection, nitroGLYCERIN, ondansetron (ZOFRAN) IV, ondansetron, oxyCODONE, polyvinyl alcohol, sodium chloride, traZODone  Antibiotics: Anti-infectives   Start     Dose/Rate Route Frequency Ordered Stop   09/11/12 1000  terbinafine (LAMISIL) tablet 250 mg     250 mg Oral Daily 09/11/12 0724 09/25/12 0959   09/08/12 1100  fluconazole (DIFLUCAN) tablet 100 mg  Status:  Discontinued     100 mg Oral Daily 09/08/12 1013 09/11/12 0724   09/07/12 1600  valACYclovir (VALTREX) tablet 1,000 mg     1,000 mg Oral 3 times daily 09/07/12 1303 09/14/12 1559   09/04/12 1200  imipenem-cilastatin (PRIMAXIN) 250 mg in sodium chloride 0.9 % 100 mL IVPB     250 mg 200 mL/hr over 30 Minutes Intravenous 4 times per day 09/04/12 0856     08/29/12 1200  levofloxacin (LEVAQUIN) tablet 500 mg  Status:  Discontinued     500 mg Oral Every 48 hours 08/29/12 0957 09/04/12  1008   08/27/12 0900  vancomycin (VANCOCIN) 1,250 mg in sodium chloride 0.9 % 250 mL IVPB  Status:  Discontinued     1,250 mg 166.7 mL/hr over 90 Minutes Intravenous Every 24 hours 08/27/12 0826 08/28/12 1809   08/27/12 0900  piperacillin-tazobactam (ZOSYN) IVPB 3.375 g  Status:  Discontinued     3.375 g 12.5 mL/hr over 240 Minutes Intravenous 3 times per day 08/27/12 0826 08/28/12 1809   08/27/12 0600  levofloxacin (LEVAQUIN) IVPB 500 mg     500 mg 100 mL/hr over 60 Minutes Intravenous  Once 08/27/12 0554 08/27/12 0735       PHYSICAL EXAM: Vital signs in last 24 hours: Filed Vitals:   09/12/12 2205 09/13/12 0521 09/13/12 0752 09/13/12 0958  BP: 140/76 137/80  150/80  Pulse: 80 52  66  Temp: 98.2 F (36.8 C) 98.2 F (36.8 C)    TempSrc: Oral Axillary    Resp: 18 18    Height:      Weight:   92.4 kg (203 lb 11.3 oz)    SpO2: 97% 92% 94% 97%    Weight change: -0.6 kg (-1 lb 5.2 oz) Filed Weights   09/11/12 0651 09/12/12 0500 09/13/12 0521  Weight: 95.5 kg (210 lb 8.6 oz) 93 kg (205 lb 0.4 oz) 92.4 kg (203 lb 11.3 oz)   Body mass index is 31.9 kg/(m^2).   Gen Exam: Awake and alert, appears tired, mild resp distress. Always pleasant Neck: Supple, No JVD.   Chest: minimal crackles, poor air movement. Mildly increased work of breathing CVS: S1 S2 Regular, no murmurs.  Abdomen: soft, BS +, non tender, non distended.  Extremities 2-3+ edema on dorsum of feet bilaterally, lower extremities warm to touch.  Neurologic:  No acute changes, Non focal Skin: multiple areas of rash.  Erythematous scale across his buttocks and scrotum.  Similar appearing rash on his distal lower extremities.   Plantar portion of feet are decreasing in redness (confluent erythema), and groin is red as well w/o scale - shows improvement over 6/25.   Intake/Output from previous day:  Intake/Output Summary (Last 24 hours) at 09/13/12 1209 Last data filed at 09/13/12 1058  Gross per 24 hour  Intake   1680 ml  Output   3500 ml  Net  -1820 ml     LAB RESULTS: CBC  Recent Labs Lab 09/09/12 0500 09/10/12 0543 09/11/12 0500 09/12/12 0705 09/13/12 0510  WBC 19.3* 20.9* 20.6* 15.7* 18.1*  HGB 11.5* 11.6* 11.1* 10.7* 11.4*  HCT 33.8* 34.8* 32.3* 32.4* 33.9*  PLT 347 353 301 318 335  MCV 91.1 91.8 91.2 93.4 92.4  MCH 31.0 30.6 31.4 30.8 31.1  MCHC 34.0 33.3 34.4 33.0 33.6  RDW 15.5 15.6* 15.7* 15.6* 15.3  LYMPHSABS 1.5  --   --   --   --   MONOABS 0.9  --   --   --   --   EOSABS 0.0  --   --   --   --   BASOSABS 0.0  --   --   --   --     Chemistries   Recent Labs Lab 09/08/12 1425 09/09/12 0500 09/10/12 0543 09/11/12 0500 09/13/12 0510  NA 135 135 136 138 136  K 4.2 3.6 3.5 4.0 3.3*  CL 98 98 99 99 95*  CO2 26 29 29 28  33*  GLUCOSE 336* 134* 133* 87 154*  BUN 62* 60* 63* 64* 55*   CREATININE 2.36* 2.28* 2.30*  2.45* 2.19*  CALCIUM 7.9* 8.2* 8.2* 8.3* 8.3*    CBG:  Recent Labs Lab 09/12/12 1156 09/12/12 1827 09/12/12 2110 09/13/12 0552 09/13/12 1102  GLUCAP 175* 196* 185* 126* 165*    Urine Studies MICROBIOLOGY: Recent Results (from the past 240 hour(s))  MRSA PCR SCREENING     Status: Abnormal   Collection Time    09/11/12  2:06 PM      Result Value Range Status   MRSA by PCR POSITIVE (*) NEGATIVE Final   Comment:            The GeneXpert MRSA Assay (FDA     approved for NASAL specimens     only), is one component of a     comprehensive MRSA colonization     surveillance program. It is not     intended to diagnose MRSA     infection nor to guide or     monitor treatment for     MRSA infections.     RESULT CALLED TO, READ BACK BY AND VERIFIED WITH:     MILLER RN 15:40 09/11/12 ([email or web address])    RADIOLOGY STUDIES/RESULTS: Dg Chest 2 View  09/01/2012   *RADIOLOGY REPORT*  Clinical Data: Shortness of breath.  CHEST - 2 VIEW  Comparison: 08/31/2012.  Findings: The left subclavian Port-A-Cath tip it is most likely in the mid SVC at the level of the carina.  Stable pacer wires.  The heart is enlarged but unchanged.  Persistent bilateral infiltrates and effusions.  IMPRESSION:  1.  The Port-A-Cath tip appears to be in the mid SVC. 2.  Persistent bilateral infiltrates and small effusions.   Original Report Authenticated By: Rudie Meyer, [name]   Ct Chest Wo Contrast  08/30/2012   *RADIOLOGY REPORT*  Clinical Data: Hemoptysis.  Pneumonia versus edema.  CT CHEST WITHOUT CONTRAST  Technique:  Multidetector CT imaging of the chest was performed following the standard protocol without IV contrast.  Comparison: Chest radiograph 08/30/2012 and chest CT 02/05/2012  Findings: Heart is enlarged.  Cardiomegaly appears stable. Stable prominent fat density, consistent with lipoma, between the left and right atria.  Dual lead pacer is present.  There is heavy coronary artery  atherosclerotic calcification of all three coronary arteries.  Aortic valvular calcifications are present.  A left IJ central venous catheter terminates in the superior vena cava. Thyroid gland and thoracic inlet appear within normal limits.  Heavy atherosclerotic calcification of the thoracic aorta, without aneurysm.  Several mediastinal lymph nodes are visualized, and within normal limits for size.  No pathologically enlarged lymph nodes are seen in the thorax.  Evaluation for the hilar lymph nodes is somewhat limited without intravenous contrast.  Small bilateral pleural effusions are present.  Multilevel degenerative changes of the thoracic spine.  Multiple benign hemangiomas in the thoracic spine vertebral bodies.  No acute or suspicious osseous abnormality.  No acute findings are identified in the abdomen.  Surgical clips are seen in the left upper quadrant.  There are postsurgical changes of the right lower lobectomy.  There is a background of centrilobular emphysema.  Stable scarring in the right upper lobe anteriorly.  No definite intralobular septal thickening.  There is markedly asymmetric airspace disease in the inferior and posterior aspect of the right upper lobe, where there are areas of consolidation.  There are ground-glass opacities which are new compared to prior chest CT in the left upper lobe, left lower lobe, and right middle lobe.  5 mm pulmonary nodule in the left  upper lobe peripherally on image #27 of the lung windows is new.  IMPRESSION:  1.  Markedly asymmetric bilateral airspace disease.  Airspace disease is most prominent, with areas of consolidation, in the posterior and inferior right upper lobe ( the patient has a history of right lower lobectomy).  Findings in the right upper lobe are favored to be due to pneumonia or aspiration. 2.  New ground-glass opacities in the left upper lobe and left lower lobe may be due to early infection.  Underlying mild pulmonary edema cannot be  excluded in this patient with cardiomegaly and small bilateral pleural effusions. 3.  New 5 mm pulmonary nodule left upper lobe. If the patient is at high risk for bronchogenic carcinoma, follow-up chest CT at 6-12 months is recommended.  If the patient is at low risk for bronchogenic carcinoma, follow-up chest CT at 12 months is recommended.  This recommendation follows the consensus statement: Guidelines for Management of Small Pulmonary Nodules Detected on CT Scans: A Statement from the Fleischner Society as published in Radiology 2005; 237:395-400. 4.  Cardiomegaly with three-vessel coronary artery disease and dual lead cardiac pacer. 5.  Small bilateral pleural effusions 6.  Aortic valve calcifications. This finding raises the possibility of aortic valvular stenosis.  .   Original Report Authenticated By: Britta Mccreedy, [name]   Dg Chest Port 1 View  08/31/2012   *RADIOLOGY REPORT*  Clinical Data: Follow-up edema  PORTABLE CHEST - 1 VIEW  Comparison: Yesterday  Findings: The tip of the left subclavian Port-A-Cath has changed its orientation.  Based on a single view, it is likely in the azygos vein.  Right subclavian dual lead pacemaker device and leads are stable and intact.  Bilateral pleural effusions left greater than right are stable.  Diffuse edema is stable. Cardiomegaly.  IMPRESSION: Left subclavian Port-A-Cath tip has changed its position and is likely in the azygos vein.  This can be confirmed with a lateral view.  Stable airspace disease and pleural effusions.   Original Report Authenticated By: Jolaine Click, [name]   Dg Chest Port 1 View  08/30/2012   *RADIOLOGY REPORT*  Clinical Data: Respiratory failure.  PORTABLE CHEST - 1 VIEW  Comparison: 08/29/2012 and 08/27/2012  Findings: Prominent interstitial markings could represent underlying edema.  There are persistent interstitial and airspace densities in the right mid and lower lung region.  Heart size remains enlarged.  Again noted is a right dual  lead cardiac pacemaker.  Left subclavian Port-A-Cath in the SVC region.  Aortic arch is heavily calcified.  IMPRESSION:  Prominent interstitial markings may represent underlying edema. Again noted are increased densities in the right mid and lower lung region which could represent asymmetric edema but cannot exclude an infectious etiology.   Original Report Authenticated By: Richarda Overlie, [name]   Dg Chest Port 1 View  08/29/2012   *RADIOLOGY REPORT*  Clinical Data: Follow-up CHF  PORTABLE CHEST - 1 VIEW  Comparison: Prior chest x-ray 08/27/2012  Findings: Stable position of left subclavian central venous catheter with the tip in the mid superior vena cava.  Right subclavian approach cardiac rhythm maintenance device with leads in the right atrium and right ventricle.  Slightly improved inspiratory volumes with clearing in the right base.  Stable elevation of the left hemidiaphragm resulting in blunting of the left costophrenic angle.  Decreasing interstitial edema with the residual asymmetric patchy opacity in the right mid and lower lobe. Small residual right pleural effusion.  Stable cardiomegaly. Aortic atherosclerosis again noted.  No  pneumothorax.  IMPRESSION:  1.  Decreasing pulmonary edema with improved inspiratory volumes and clearing in the right base. 2.  Residual asymmetric patchy opacity in the right mid and lower lung may reflect residual asymmetric alveolar edema, or pneumonia. 3.  Persistent small right pleural effusion, chronic elevation of the left hemidiaphragm and cardiomegaly.   Original Report Authenticated By: Malachy Moan, [name]   Dg Chest Portable 1 View  08/27/2012   *RADIOLOGY REPORT*  Clinical Data: Shortness of breath.  History of lung cancer post lobectomy.  PORTABLE CHEST - 1 VIEW  Comparison: 02/02/2012  Findings: Shallow inspiration.  Cardiac enlargement with pulmonary vascular congestion and perihilar edema.  Bilateral pleural effusions.  Atelectasis in the lung bases.  No  pneumothorax. Changes have developed since the previous study.  Postoperative changes in the right chest.  Stable appearance of cardiac pacemaker and left central venous catheter.  Calcified and tortuous aorta.  IMPRESSION: Interval development of cardiac enlargement, pulmonary vascular congestion, perihilar edema, and bilateral pleural effusions.   Original Report Authenticated By: Burman Nieves, [name]    [facility] Pager:[phone]  If 7PM-7AM, please contact night-coverage [email or web address] Password [email or web address] 09/13/2012, 12:09 PM   LOS: 17 days

## 2012-09-14 ENCOUNTER — Inpatient Hospital Stay (HOSPITAL_COMMUNITY): Payer: Medicare Other

## 2012-09-14 ENCOUNTER — Encounter (HOSPITAL_COMMUNITY): Payer: Self-pay | Admitting: Emergency Medicine

## 2012-09-14 DIAGNOSIS — J441 Chronic obstructive pulmonary disease with (acute) exacerbation: Secondary | ICD-10-CM

## 2012-09-14 DIAGNOSIS — F411 Generalized anxiety disorder: Secondary | ICD-10-CM

## 2012-09-14 LAB — RENAL FUNCTION PANEL
CO2: 33 mEq/L — ABNORMAL HIGH (ref 19–32)
Chloride: 91 mEq/L — ABNORMAL LOW (ref 96–112)
GFR calc Af Amer: 28 mL/min — ABNORMAL LOW (ref >90)
Glucose, Bld: 138 mg/dL — ABNORMAL HIGH (ref 70–99)
Phosphorus: 3.9 mg/dL (ref 2.3–4.6)
Potassium: 2.9 mEq/L — ABNORMAL LOW (ref 3.5–5.1)
Sodium: 133 mEq/L — ABNORMAL LOW (ref 135–145)

## 2012-09-14 LAB — EXPECTORATED SPUTUM ASSESSMENT W GRAM STAIN, RFLX TO RESP C

## 2012-09-14 LAB — CBC
Hemoglobin: 11.4 g/dL — ABNORMAL LOW (ref 13.0–17.0)
RBC: 3.68 MIL/uL — ABNORMAL LOW (ref 4.22–5.81)
WBC: 15.2 10*3/uL — ABNORMAL HIGH (ref 4.0–10.5)

## 2012-09-14 LAB — IRON AND TIBC
Saturation Ratios: 11 % — ABNORMAL LOW (ref 20–55)
UIBC: 295 ug/dL (ref 125–400)

## 2012-09-14 LAB — GLUCOSE, CAPILLARY: Glucose-Capillary: 122 mg/dL — ABNORMAL HIGH (ref 70–99)

## 2012-09-14 MED ORDER — BUDESONIDE 0.25 MG/2ML IN SUSP
0.5000 mg | Freq: Two times a day (BID) | RESPIRATORY_TRACT | Status: DC
  Administered 2012-09-14 – 2012-09-16 (×4): 0.5 mg via RESPIRATORY_TRACT
  Filled 2012-09-14 (×7): qty 4

## 2012-09-14 MED ORDER — POTASSIUM CHLORIDE CRYS ER 20 MEQ PO TBCR
60.0000 meq | EXTENDED_RELEASE_TABLET | Freq: Four times a day (QID) | ORAL | Status: AC
  Administered 2012-09-14 (×2): 60 meq via ORAL
  Filled 2012-09-14 (×2): qty 3

## 2012-09-14 MED ORDER — FUROSEMIDE 80 MG PO TABS
160.0000 mg | ORAL_TABLET | Freq: Four times a day (QID) | ORAL | Status: DC
  Administered 2012-09-14 – 2012-09-15 (×4): 160 mg via ORAL
  Filled 2012-09-14 (×9): qty 2

## 2012-09-14 MED ORDER — MINERAL OIL RE ENEM
1.0000 | ENEMA | Freq: Once | RECTAL | Status: AC
  Administered 2012-09-14: 1 via RECTAL
  Filled 2012-09-14: qty 1

## 2012-09-14 MED ORDER — PREDNISONE 10 MG PO TABS
10.0000 mg | ORAL_TABLET | Freq: Every day | ORAL | Status: DC
  Administered 2012-09-15 – 2012-09-16 (×2): 10 mg via ORAL
  Filled 2012-09-14 (×3): qty 1

## 2012-09-14 MED ORDER — POTASSIUM CHLORIDE CRYS ER 20 MEQ PO TBCR
40.0000 meq | EXTENDED_RELEASE_TABLET | Freq: Once | ORAL | Status: AC
  Administered 2012-09-14: 40 meq via ORAL
  Filled 2012-09-14: qty 2

## 2012-09-14 MED ORDER — DOXYCYCLINE HYCLATE 100 MG PO TABS
100.0000 mg | ORAL_TABLET | Freq: Two times a day (BID) | ORAL | Status: DC
  Administered 2012-09-14 – 2012-09-15 (×3): 100 mg via ORAL
  Filled 2012-09-14 (×5): qty 1

## 2012-09-14 NOTE — Progress Notes (Addendum)
Physical Therapy Treatment Patient Details Name: [name] STUBER Sr. MRN: [id number] DOB: 1932/09/17 Today's Date: 09/14/2012 Time: 4098-1191 PT Time Calculation (min): 26 min  PT Assessment / Plan / Recommendation  PT Comments   Pt progressing well towards all goals however remains to have generalized weakness and decreased activity tolerance. Pt remains to experience SOB even on 4LO2 via [place].  Follow Up Recommendations  Home health PT;Supervision/Assistance - 24 hour     Does the patient have the potential to tolerate intense rehabilitation     Barriers to Discharge        Equipment Recommendations       Recommendations for Other Services    Frequency Min 3X/week   Progress towards PT Goals Progress towards PT goals: Progressing toward goals  Plan Current plan remains appropriate    Precautions / Restrictions Precautions Precautions: Fall Precaution Comments: [place]ton 4lO2 va [place] Restrictions Weight Bearing Restrictions: No   Pertinent Vitals/Pain Denies pain    Mobility  Bed Mobility Bed Mobility: Supine to Sit;Sit to Supine Supine to Sit: 6: Modified independent (Device/Increase time);HOB elevated Sitting - Scoot to Edge of Bed: 5: Supervision Sit to Supine: 6: Modified independent (Device/Increase time);HOB elevated Details for Bed Mobility Assistance: increased time, + SOB but safe technique Transfers Transfers: Sit to Stand;Stand to Sit Sit to Stand: 5: Supervision;With upper extremity assist;From bed;From chair/3-in-1 Stand to Sit: 5: Supervision;With upper extremity assist;To chair/3-in-1;To bed Details for Transfer Assistance: v/c's to push up from bed/chair Ambulation/Gait Ambulation/Gait Assistance: 4: Min guard Ambulation Distance (Feet): 60 Feet (x2) Assistive device: Rolling walker Ambulation/Gait Assistance Details: no episodes of LOB,  + SOB, slow/steady pace. increased trunk flexion Gait Pattern: Step-through pattern;Decreased stride length;Wide base of  support Gait velocity: decr General Gait Details: pt required a 5 min rest break between ambulation sessions    Exercises     PT Diagnosis:    PT Problem List:   PT Treatment Interventions:     PT Goals (current goals can now be found in the care plan section)    Visit Information  Last PT Received On: 09/14/12 Assistance Needed: +1 History of Present Illness: acute on chronic respiratory failure, CHF    Subjective Data  Subjective: Pt received supine in bed agreeable to ambulate   Cognition  Cognition Arousal/Alertness: Awake/alert Behavior During Therapy: WFL for tasks assessed/performed Overall Cognitive Status: Within Functional Limits for tasks assessed    Balance     End of Session PT - End of Session Equipment Utilized During Treatment: Gait belt;Oxygen Activity Tolerance: Patient tolerated treatment well Patient left: in bed;with family/visitor present;with nursing/sitter in room (transportation arrived to take pt to test) Nurse Communication: Mobility status   GP     Marcene Brawn 09/14/2012, 1:25 PM  Lewis Shock, PT, DPT Pager #: [phone] Office #: [phone]

## 2012-09-14 NOTE — Progress Notes (Signed)
Continues IV fluids changed to 20 cc/hr per IV team member, following hospital police for central line.

## 2012-09-14 NOTE — Progress Notes (Signed)
PATIENT DETAILS Name: [name] NYDAM Sr. Age: 79 y.o. Sex: male Date of Birth: 01-26-1933 Admit Date: 08/27/2012 Admitting Physician Clydia Llano, MD ZOX:[id number] [name], MD  Subjective: Feeling much better.  Small amount of hemoptysis today. Awaiting path from Bronchoscopy last week.  Assessment/Plan: Acute on CHRONIC OBSTRUCTIVE PULMONARY DISEASE, SEVERE - No respiratory distress. Continues on oxygen via n/c - Cont supportive care with nebs/MDI  - prednisone decreased to 10 mg on 6/30.  slow taper  Acute on chronic diastolic heart failure -CXR 6/27 with fluid overload and PNA.  BNP is stable at 1700. - strict Is&Os  - Creatinine slowly trending up (likely due to diuresis) [facility] was consulted for assistance 6/27 - Do not lie flat, keep head of bed raised. - 2 gm sodium diet, 1200 ml fluid restriction. -Heart failure team was consulted and they recommended all 4 extremity wrapping.  Acute on CKD (chronic kidney disease), stage III  -[facility] consulted 6/27 to assist with diuresis and renal function. -Diuretics changed to lasix 160 mg IV q 8 and metolazone 5 mg x 2 days. -Diuretics now narrowed to lasix only 160 mg IV  q 8. -Clinically patient is improved.   Anasarca /Ascites /pulmonary edema  - please see above under Acute on chronic CKD - Very difficult situation - In afib, with low albumin, not responding well to diuresis - Have requested assistance from both the CHF and Nephrology services. (6/27).  Hemoptysis -Etiology? Recurrence of underlying malignancy vs PNA. -Hgb relatively stable -Consulted ENT (Dr. [name]) 6/26 who performed fiberoptic laryngoscopy and found no pathology in the upper most respiratory/gi tract. -Bronchoscopy was done on 6/17 then redone on 6/27.  showed a lesion along the old suture line, concerning for recurrence.    Path pending. -  PNA -BAL positive for ESBL E Coli and Candida albicans -completed 9 days of levaquin -completed 10  days of Primaxin as of 6/30. -Oral Diflucan.   Rash on buttocks,  Scrotum, lower legs, groin and feet. -Spoke with Dr. [name] (patient's Dermatologist) on the telephone.  She feels this is at least partially a disseminated fungal infection as he has had extensive fungal infections in the past and is significantly immunocompromised. -Lamisil 250 mg po qd x 14 days per Dr. Swaziland - changed back to oral diflucan 6/28. -triamcinolone / silvadene cream TID to affected areas. -Completed 7 days of valtrex for possible shingles on buttocks. -Avoid Gabapentin as it caused neurological changes.  Atrial fibrillation - Unfortunately off anti-coagulation given hemoptysis, per Dr. [name] ok to start Epixiban 2-3 days after hemoptysis resolves. -On metoprolol and Cardizem for rate control -Tolerating Amiodarone well  Dysarthia (6/25) -Resolved.  Likely the result of gabapentin initiated 6/24. D/c'd 6/25. -Neuro consulted.  CT head negative for stroke.  Leukocytosis - WBC started to trend down. - Uncertain etiology. Multifactorial from possible pneumonia, on steroids - Monitor  Hypertension - Controlled- continue with current medication  Diabetes - Lantus decreased to 8 units, continue with SSI-s - patient with mild hypoglycemia this am (6/27)  CORONARY ARTERY DISEASE (nonobstructive disease 2006 catheterization)/Elevation of cardiac enzymes  -no CP and likely due to acute HF, demand ischemia from recent hypoxia and worsened renal function - Cardiology following  Cellulitis of left ankle  -has decreased rednessboth legs c/w stasis dermatitis- resolved   NSCLC s/p RULectomy and adjuvant chemo in 2006 - Does have a new lung nodule-will need close followup as an outpatient - Oncology has seen this admission, and will monitor  lung nodule outpatient.  Disposition: Remain inpatient.  Transfer to 4700.    DVT Prophylaxis:  SCD's  Code Status: Full code  Family Communication Spouse at  bedside  Procedures:  None  CONSULTS: Cardiology Pulmonology Infectious disease. Ear Nose and throat Oncology Dermatology Nephrology Advanced Heart Failure Team   MEDICATIONS: Scheduled Meds: . amiodarone  400 mg Oral BID  . arformoterol  15 mcg Nebulization BID  . atorvastatin  40 mg Oral QHS  . barrier cream  1 application Topical BID  . budesonide  0.5 mg Nebulization BID  . Chlorhexidine Gluconate Cloth  6 each Topical Q0600  . citalopram  10 mg Oral Daily  . diltiazem  180 mg Oral BID  . diphenhydrAMINE  25 mg Oral Q6H  . docusate sodium  200 mg Oral BID  . ezetimibe  10 mg Oral Daily  . feeding supplement  237 mL Oral Q24H  . fluconazole  100 mg Oral Daily  . furosemide  160 mg Oral Q6H  . guaiFENesin  1,200 mg Oral BID  . insulin aspart  0-9 Units Subcutaneous TID WC  . insulin glargine  8 Units Subcutaneous QHS  . lidocaine  15 mL Mouth/Throat Once  . magnesium oxide  400 mg Oral Daily  . metoprolol succinate  25 mg Oral BID WC  . multivitamin  1 tablet Oral Daily  . mupirocin ointment  1 application Nasal BID  . pantoprazole  40 mg Oral Daily  . polyethylene glycol  17 g Oral BID  . potassium chloride  40 mEq Oral Once  . potassium chloride  60 mEq Oral Q6H  . [START ON 09/15/2012] predniSONE  10 mg Oral QPC breakfast  . senna-docusate  3 tablet Oral QHS  . sodium chloride  3 mL Intravenous Q12H  . terbinafine  250 mg Oral Daily  . tiotropium  18 mcg Inhalation Daily  . triamcinolone cream   Topical TID   Continuous Infusions: . sodium chloride 20 mL/hr at 09/11/12 2258   PRN Meds:.ALPRAZolam, Glycerin (Adult), levalbuterol, morphine injection, nitroGLYCERIN, ondansetron (ZOFRAN) IV, ondansetron, oxyCODONE, polyvinyl alcohol, sodium chloride, traZODone  Antibiotics: Anti-infectives   Start     Dose/Rate Route Frequency Ordered Stop   09/13/12 1330  fluconazole (DIFLUCAN) tablet 100 mg     100 mg Oral Daily 09/13/12 1213     09/11/12 1000   terbinafine (LAMISIL) tablet 250 mg     250 mg Oral Daily 09/11/12 0724 09/25/12 0959   09/08/12 1100  fluconazole (DIFLUCAN) tablet 100 mg  Status:  Discontinued     100 mg Oral Daily 09/08/12 1013 09/11/12 0724   09/07/12 1600  valACYclovir (VALTREX) tablet 1,000 mg     1,000 mg Oral 3 times daily 09/07/12 1303 09/14/12 0952   09/04/12 1200  imipenem-cilastatin (PRIMAXIN) 250 mg in sodium chloride 0.9 % 100 mL IVPB  Status:  Discontinued     250 mg 200 mL/hr over 30 Minutes Intravenous 4 times per day 09/04/12 0856 09/14/12 1037   08/29/12 1200  levofloxacin (LEVAQUIN) tablet 500 mg  Status:  Discontinued     500 mg Oral Every 48 hours 08/29/12 0957 09/04/12 1008   08/27/12 0900  vancomycin (VANCOCIN) 1,250 mg in sodium chloride 0.9 % 250 mL IVPB  Status:  Discontinued     1,250 mg 166.7 mL/hr over 90 Minutes Intravenous Every 24 hours 08/27/12 0826 08/28/12 1809   08/27/12 0900  piperacillin-tazobactam (ZOSYN) IVPB 3.375 g  Status:  Discontinued  3.375 g 12.5 mL/hr over 240 Minutes Intravenous 3 times per day 08/27/12 0826 08/28/12 1809   08/27/12 0600  levofloxacin (LEVAQUIN) IVPB 500 mg     500 mg 100 mL/hr over 60 Minutes Intravenous  Once 08/27/12 0554 08/27/12 0735       PHYSICAL EXAM: Vital signs in last 24 hours: Filed Vitals:   09/13/12 2049 09/14/12 0633 09/14/12 0634 09/14/12 0946  BP: 113/75 135/81  113/74  Pulse: 69 70  70  Temp:  98.7 F (37.1 C)    TempSrc:  Oral    Resp: 18 18    Height:      Weight:   89.54 kg (197 lb 6.4 oz)   SpO2: 96% 99%      Weight change: -2.86 kg (-6 lb 4.9 oz) Filed Weights   09/12/12 0500 09/13/12 0521 09/14/12 0634  Weight: 93 kg (205 lb 0.4 oz) 92.4 kg (203 lb 11.3 oz) 89.54 kg (197 lb 6.4 oz)   Body mass index is 30.91 kg/(m^2).   Gen Exam: Awake and alert, appears improved.  Always pleasant Neck: Supple, No JVD.   Chest: minimal crackles, good air movement CVS: S1 S2 Regular, no murmurs. Difficult to hear Abdomen:  soft, BS +, non tender, non distended.  Extremities anasarca with weeping of upper extremities R>L.  Lower extremities wrapped with ace.  Neurologic:  No acute changes, Non focal Skin: rash improving.   Intake/Output from previous day:  Intake/Output Summary (Last 24 hours) at 09/14/12 1107 Last data filed at 09/14/12 0901  Gross per 24 hour  Intake   2178 ml  Output   5175 ml  Net  -2997 ml     LAB RESULTS: CBC  Recent Labs Lab 09/09/12 0500 09/10/12 0543 09/11/12 0500 09/12/12 0705 09/13/12 0510 09/14/12 0500  WBC 19.3* 20.9* 20.6* 15.7* 18.1* 15.2*  HGB 11.5* 11.6* 11.1* 10.7* 11.4* 11.4*  HCT 33.8* 34.8* 32.3* 32.4* 33.9* 33.7*  PLT 347 353 301 318 335 325  MCV 91.1 91.8 91.2 93.4 92.4 91.6  MCH 31.0 30.6 31.4 30.8 31.1 31.0  MCHC 34.0 33.3 34.4 33.0 33.6 33.8  RDW 15.5 15.6* 15.7* 15.6* 15.3 15.1  LYMPHSABS 1.5  --   --   --   --   --   MONOABS 0.9  --   --   --   --   --   EOSABS 0.0  --   --   --   --   --   BASOSABS 0.0  --   --   --   --   --     Chemistries   Recent Labs Lab 09/09/12 0500 09/10/12 0543 09/11/12 0500 09/13/12 0510 09/14/12 0500  NA 135 136 138 136 133*  K 3.6 3.5 4.0 3.3* 2.9*  CL 98 99 99 95* 91*  CO2 29 29 28  33* 33*  GLUCOSE 134* 133* 87 154* 138*  BUN 60* 63* 64* 55* 61*  CREATININE 2.28* 2.30* 2.45* 2.19* 2.46*  CALCIUM 8.2* 8.2* 8.3* 8.3* 8.6    CBG:  Recent Labs Lab 09/13/12 0552 09/13/12 1102 09/13/12 1614 09/13/12 2131 09/14/12 0550  GLUCAP 126* 165* 175* 177* 122*    Urine Studies MICROBIOLOGY: Recent Results (from the past 240 hour(s))  MRSA PCR SCREENING     Status: Abnormal   Collection Time    09/11/12  2:06 PM      Result Value Range Status   MRSA by PCR POSITIVE (*) NEGATIVE Final   Comment:  The GeneXpert MRSA Assay (FDA     approved for NASAL specimens     only), is one component of a     comprehensive MRSA colonization     surveillance program. It is not     intended to  diagnose MRSA     infection nor to guide or     monitor treatment for     MRSA infections.     RESULT CALLED TO, READ BACK BY AND VERIFIED WITH:     MILLER RN 15:40 09/11/12 ([email or web address])  CULTURE, BLOOD (ROUTINE X 2)     Status: None   Collection Time    09/13/12  2:11 PM      Result Value Range Status   Specimen Description BLOOD RIGHT ARM   Final   Special Requests BOTTLES DRAWN AEROBIC AND ANAEROBIC 10CC   Final   Culture  Setup Time 09/13/2012 19:02   Final   Culture     Final   Value:        BLOOD CULTURE RECEIVED NO GROWTH TO DATE CULTURE WILL BE HELD FOR 5 DAYS BEFORE ISSUING A FINAL NEGATIVE REPORT   Report Status PENDING   Incomplete  CULTURE, BLOOD (ROUTINE X 2)     Status: None   Collection Time    09/13/12  2:11 PM      Result Value Range Status   Specimen Description BLOOD RIGHT HAND   Final   Special Requests BOTTLES DRAWN AEROBIC AND ANAEROBIC 10CC   Final   Culture  Setup Time 09/13/2012 19:02   Final   Culture     Final   Value:        BLOOD CULTURE RECEIVED NO GROWTH TO DATE CULTURE WILL BE HELD FOR 5 DAYS BEFORE ISSUING A FINAL NEGATIVE REPORT   Report Status PENDING   Incomplete    RADIOLOGY STUDIES/RESULTS: Dg Chest 2 View  09/01/2012   *RADIOLOGY REPORT*  Clinical Data: Shortness of breath.  CHEST - 2 VIEW  Comparison: 08/31/2012.  Findings: The left subclavian Port-A-Cath tip it is most likely in the mid SVC at the level of the carina.  Stable pacer wires.  The heart is enlarged but unchanged.  Persistent bilateral infiltrates and effusions.  IMPRESSION:  1.  The Port-A-Cath tip appears to be in the mid SVC. 2.  Persistent bilateral infiltrates and small effusions.   Original Report Authenticated By: Rudie Meyer, [name]   Ct Chest Wo Contrast  08/30/2012   *RADIOLOGY REPORT*  Clinical Data: Hemoptysis.  Pneumonia versus edema.  CT CHEST WITHOUT CONTRAST  Technique:  Multidetector CT imaging of the chest was performed following the standard protocol without IV  contrast.  Comparison: Chest radiograph 08/30/2012 and chest CT 02/05/2012  Findings: Heart is enlarged.  Cardiomegaly appears stable. Stable prominent fat density, consistent with lipoma, between the left and right atria.  Dual lead pacer is present.  There is heavy coronary artery atherosclerotic calcification of all three coronary arteries.  Aortic valvular calcifications are present.  A left IJ central venous catheter terminates in the superior vena cava. Thyroid gland and thoracic inlet appear within normal limits.  Heavy atherosclerotic calcification of the thoracic aorta, without aneurysm.  Several mediastinal lymph nodes are visualized, and within normal limits for size.  No pathologically enlarged lymph nodes are seen in the thorax.  Evaluation for the hilar lymph nodes is somewhat limited without intravenous contrast.  Small bilateral pleural effusions are present.  Multilevel degenerative changes of the thoracic spine.  Multiple benign hemangiomas in the thoracic spine vertebral bodies.  No acute or suspicious osseous abnormality.  No acute findings are identified in the abdomen.  Surgical clips are seen in the left upper quadrant.  There are postsurgical changes of the right lower lobectomy.  There is a background of centrilobular emphysema.  Stable scarring in the right upper lobe anteriorly.  No definite intralobular septal thickening.  There is markedly asymmetric airspace disease in the inferior and posterior aspect of the right upper lobe, where there are areas of consolidation.  There are ground-glass opacities which are new compared to prior chest CT in the left upper lobe, left lower lobe, and right middle lobe.  5 mm pulmonary nodule in the left upper lobe peripherally on image #27 of the lung windows is new.  IMPRESSION:  1.  Markedly asymmetric bilateral airspace disease.  Airspace disease is most prominent, with areas of consolidation, in the posterior and inferior right upper lobe ( the  patient has a history of right lower lobectomy).  Findings in the right upper lobe are favored to be due to pneumonia or aspiration. 2.  New ground-glass opacities in the left upper lobe and left lower lobe may be due to early infection.  Underlying mild pulmonary edema cannot be excluded in this patient with cardiomegaly and small bilateral pleural effusions. 3.  New 5 mm pulmonary nodule left upper lobe. If the patient is at high risk for bronchogenic carcinoma, follow-up chest CT at 6-12 months is recommended.  If the patient is at low risk for bronchogenic carcinoma, follow-up chest CT at 12 months is recommended.  This recommendation follows the consensus statement: Guidelines for Management of Small Pulmonary Nodules Detected on CT Scans: A Statement from the Fleischner Society as published in Radiology 2005; 237:395-400. 4.  Cardiomegaly with three-vessel coronary artery disease and dual lead cardiac pacer. 5.  Small bilateral pleural effusions 6.  Aortic valve calcifications. This finding raises the possibility of aortic valvular stenosis.  .   Original Report Authenticated By: Britta Mccreedy, [name]   Dg Chest Port 1 View  08/31/2012   *RADIOLOGY REPORT*  Clinical Data: Follow-up edema  PORTABLE CHEST - 1 VIEW  Comparison: Yesterday  Findings: The tip of the left subclavian Port-A-Cath has changed its orientation.  Based on a single view, it is likely in the azygos vein.  Right subclavian dual lead pacemaker device and leads are stable and intact.  Bilateral pleural effusions left greater than right are stable.  Diffuse edema is stable. Cardiomegaly.  IMPRESSION: Left subclavian Port-A-Cath tip has changed its position and is likely in the azygos vein.  This can be confirmed with a lateral view.  Stable airspace disease and pleural effusions.   Original Report Authenticated By: Jolaine Click, [name]   Dg Chest Port 1 View  08/30/2012   *RADIOLOGY REPORT*  Clinical Data: Respiratory failure.  PORTABLE CHEST -  1 VIEW  Comparison: 08/29/2012 and 08/27/2012  Findings: Prominent interstitial markings could represent underlying edema.  There are persistent interstitial and airspace densities in the right mid and lower lung region.  Heart size remains enlarged.  Again noted is a right dual lead cardiac pacemaker.  Left subclavian Port-A-Cath in the SVC region.  Aortic arch is heavily calcified.  IMPRESSION:  Prominent interstitial markings may represent underlying edema. Again noted are increased densities in the right mid and lower lung region which could represent asymmetric edema but cannot exclude an infectious etiology.   Original Report Authenticated By: Madelaine Bhat  Lowella Dandy, [name]   Dg Chest Port 1 View  08/29/2012   *RADIOLOGY REPORT*  Clinical Data: Follow-up CHF  PORTABLE CHEST - 1 VIEW  Comparison: Prior chest x-ray 08/27/2012  Findings: Stable position of left subclavian central venous catheter with the tip in the mid superior vena cava.  Right subclavian approach cardiac rhythm maintenance device with leads in the right atrium and right ventricle.  Slightly improved inspiratory volumes with clearing in the right base.  Stable elevation of the left hemidiaphragm resulting in blunting of the left costophrenic angle.  Decreasing interstitial edema with the residual asymmetric patchy opacity in the right mid and lower lobe. Small residual right pleural effusion.  Stable cardiomegaly. Aortic atherosclerosis again noted.  No pneumothorax.  IMPRESSION:  1.  Decreasing pulmonary edema with improved inspiratory volumes and clearing in the right base. 2.  Residual asymmetric patchy opacity in the right mid and lower lung may reflect residual asymmetric alveolar edema, or pneumonia. 3.  Persistent small right pleural effusion, chronic elevation of the left hemidiaphragm and cardiomegaly.   Original Report Authenticated By: Malachy Moan, [name]   Dg Chest Portable 1 View  08/27/2012   *RADIOLOGY REPORT*  Clinical Data: Shortness  of breath.  History of lung cancer post lobectomy.  PORTABLE CHEST - 1 VIEW  Comparison: 02/02/2012  Findings: Shallow inspiration.  Cardiac enlargement with pulmonary vascular congestion and perihilar edema.  Bilateral pleural effusions.  Atelectasis in the lung bases.  No pneumothorax. Changes have developed since the previous study.  Postoperative changes in the right chest.  Stable appearance of cardiac pacemaker and left central venous catheter.  Calcified and tortuous aorta.  IMPRESSION: Interval development of cardiac enlargement, pulmonary vascular congestion, perihilar edema, and bilateral pleural effusions.   Original Report Authenticated By: Burman Nieves, [name]    [facility] Pager:[phone]  If 7PM-7AM, please contact night-coverage [email or web address] Password [email or web address] 09/14/2012, 11:07 AM   LOS: 18 days

## 2012-09-14 NOTE — Progress Notes (Signed)
Active problems: COPD CHF AF CKD hemoptysis   Studies/Events: FOB 6/17 FOB 6/27   Subj: Purulent sputum. No fresh blood hemoptysis since yest. Scant old dark blood earlier today per report  Obj: Filed Vitals:   09/14/12 0946  BP: 113/74  Pulse: 70  Temp:   Resp:     Physical Exam  Constitutional: He is oriented to person, place, and time. No distress.  HENT:  Head: Normocephalic and atraumatic.  Eyes: EOM are normal. Pupils are equal, round, and reactive to light.  Neck: Normal range of motion.  Pulmonary/Chest: Effort normal. No respiratory distress. He has wheezes.  Irregular, very distant HS, no M noted  Abdominal: Soft. Bowel sounds are normal. There is no tenderness.  Musculoskeletal: He exhibits edema.  Lymphadenopathy:    He has no cervical adenopathy.  Neurological: He is alert and oriented to person, place, and time.     BMET    Component Value Date/Time   NA 133* 09/14/2012 0500   NA 136 01/20/2012 1413   NA 138 10/26/2010 1508   K 2.9* 09/14/2012 0500   K 3.8 01/20/2012 1413   K 4.4 10/26/2010 1508   CL 91* 09/14/2012 0500   CL 102 01/20/2012 1413   CL 95* 10/26/2010 1508   CO2 33* 09/14/2012 0500   CO2 25 01/20/2012 1413   CO2 29 10/26/2010 1508   GLUCOSE 138* 09/14/2012 0500   GLUCOSE 131* 01/20/2012 1413   GLUCOSE 119* 10/26/2010 1508   BUN 61* 09/14/2012 0500   BUN 31.0* 01/20/2012 1413   BUN 23* 10/26/2010 1508   CREATININE 2.46* 09/14/2012 0500   CREATININE 1.9* 01/20/2012 1413   CREATININE 1.3* 10/26/2010 1508   CALCIUM 8.6 09/14/2012 0500   CALCIUM 8.9 01/20/2012 1413   CALCIUM 8.7 10/26/2010 1508   GFRNONAA 24* 09/14/2012 0500   GFRAA 28* 09/14/2012 0500    CBC    Component Value Date/Time   WBC 15.2* 09/14/2012 0500   WBC 12.4* 02/18/2012 0948   WBC 15.2* 10/11/2009 1417   RBC 3.68* 09/14/2012 0500   RBC 3.97* 02/18/2012 0948   HGB 11.4* 09/14/2012 0500   HGB 12.1* 02/18/2012 0948   HGB 12.5* 10/11/2009 1417   HCT 33.7* 09/14/2012 0500   HCT 36.1*  02/18/2012 0948   HCT 35.8* 10/11/2009 1417   PLT 325 09/14/2012 0500   PLT 364 02/18/2012 0948   PLT 393 10/11/2009 1417   MCV 91.6 09/14/2012 0500   MCV 90.9 02/18/2012 0948   MCV 91 10/11/2009 1417   MCH 31.0 09/14/2012 0500   MCH 30.5 02/18/2012 0948   MCH 31.8 10/11/2009 1417   MCHC 33.8 09/14/2012 0500   MCHC 33.5 02/18/2012 0948   MCHC 35.0 10/11/2009 1417   RDW 15.1 09/14/2012 0500   RDW 16.3* 02/18/2012 0948   RDW 12.7 10/11/2009 1417   LYMPHSABS 1.5 09/09/2012 0500   LYMPHSABS 1.8 02/18/2012 0948   LYMPHSABS 3.0 10/11/2009 1417   MONOABS 0.9 09/09/2012 0500   MONOABS 1.2* 02/18/2012 0948   EOSABS 0.0 09/09/2012 0500   EOSABS 0.1 02/18/2012 0948   EOSABS 0.1 10/11/2009 1417   BASOSABS 0.0 09/09/2012 0500   BASOSABS 0.1 02/18/2012 0948   BASOSABS 0.1 10/11/2009 1417    CXR: improved edema patter   IMPRESSION: 1) COPD with persistent wheezing and purulent sputum 2) Hemoptysis - improved to resolved 3) H/O lung ca 4) s/p FOB - path specimens pending  PLAN/RECS:  Nebulizer regimen adjusted D/C imipenem Doxycycline ordered - 5-7 days  planned F/U path If no further hemoptysis, can retry anti-coagulation 7/1   Billy Fischer, MD ; [facility] service Mobile [phone].  After 5:30 PM or weekends, call [phone]

## 2012-09-14 NOTE — Progress Notes (Signed)
Pt oob with walker, tolerates well, pt states hes feeling better today, wife at bedside, will continue to monitor

## 2012-09-14 NOTE — Progress Notes (Signed)
Patient not ready to wear cpap at this time. 

## 2012-09-14 NOTE — Progress Notes (Signed)
Assessment completed, pt up on the chair, VSS, denies any pain or discomfort, no distress noticed. Family member at the bedside. We'll continue with POC.

## 2012-09-14 NOTE — Progress Notes (Signed)
Subjective: Interval History: has no complaint of , says he is a lot better.  Objective: Vital signs in last 24 hours: Temp:  [98.7 F (37.1 C)] 98.7 F (37.1 C) (06/30 2130) Pulse Rate:  [69-70] 70 (06/30 0946) Resp:  [18-19] 18 (06/30 0633) BP: (112-135)/(73-81) 113/74 mmHg (06/30 0946) SpO2:  [96 %-100 %] 99 % (06/30 0633) Weight:  [89.54 kg (197 lb 6.4 oz)] 89.54 kg (197 lb 6.4 oz) (06/30 0634) Weight change: -2.86 kg (-6 lb 4.9 oz)  Intake/Output from previous day: 06/29 0701 - 06/30 0700 In: 2278 [P.O.:1220; I.V.:460; IV Piggyback:598] Out: 6175 [Urine:6175] Intake/Output this shift: Total I/O In: 120 [P.O.:120] Out: -   General appearance: alert, moderately obese and plethoric Resp: diminished breath sounds bilaterally and rales bibasilar Cardio: S1, S2 normal and systolic murmur: holosystolic 2/6, blowing at apex GI: obese pos bs, liver down 5 cm Extremities: edema 3+  Lab Results:  Recent Labs  09/13/12 0510 09/14/12 0500  WBC 18.1* 15.2*  HGB 11.4* 11.4*  HCT 33.9* 33.7*  PLT 335 325   BMET:  Recent Labs  09/13/12 0510 09/14/12 0500  NA 136 133*  K 3.3* 2.9*  CL 95* 91*  CO2 33* 33*  GLUCOSE 154* 138*  BUN 55* 61*  CREATININE 2.19* 2.46*  CALCIUM 8.3* 8.6   No results found for this basename: PTH,  in the last 72 hours Iron Studies: No results found for this basename: IRON, TIBC, TRANSFERRIN, FERRITIN,  in the last 72 hours  Studies/Results: No results found.  I have reviewed the patient's current medications.  Assessment/Plan: 1 CKD 4 vol improving , convert to po Lasix q 6 h. Low K due to Metalozone primarily with Lasix 2 CHF better 3 Hemoptysis 4 DM controlled 5 Obesity 6 HTN coming down with lower vol P po Lasix. D/c Zarox, check Fe    LOS: 18 days   Roanne Haye L 09/14/2012,10:57 AM

## 2012-09-14 NOTE — Progress Notes (Signed)
Patient Name: [name] HETTINGER Sr.      SUBJECTIVE:       Weak SOB imporved  Feels stronger and walked yesterday  More hemoptysis      Past Medical History  Diagnosis Date  . Stricture and stenosis of esophagus 12/07/2004    EGD done on 12/07/2004  . GERD (gastroesophageal reflux disease)   . Hypoxemia     With chronic respiratory failure  . Increased prostate specific antigen (PSA) velocity 02/26/2011  . DM (diabetes mellitus) 02/26/2011  . Gout 02/26/2011  . COPD (chronic obstructive pulmonary disease)     oxygen at home, 4L  . OSA (obstructive sleep apnea) 02/26/2011    CPAP  . Paroxysmal atrial fibrillation   . Depression   . Cardiac arrest 2009    while in hospital   . Hypertension   . HTN (hypertension) 02/26/2011  . Cardiac arrest 2009    while in hospital  . Lung cancer     lung s/p lobectomy x2 on RT lung  . Basal cell carcinoma of nose 02/26/2011  . ETOH abuse 01/29/2012    started drinking again - hx cardiac arrest in 2009 due to dt's with organ failure  . CKD (chronic kidney disease) 02/28/2012  . Chronic diastolic CHF (congestive heart failure)     a. EF 55-60% by echo 2013.  . Sick sinus syndrome     a. s/p Medtronic pacemaker 2010.  . Bradycardia     a. Admitted 2010 with severe bradycardia, runs of paroxysmal VT.   Marland Kitchen Paroxysmal VT     a. In 2010 in setting of marked bradycardia.  Marland Kitchen CAD (coronary artery disease)     a. Nonobstructive CAD by cath 2006.  Marland Kitchen Esophageal ulcer     a. By EGD 10/2011.  Marland Kitchen Hiatal hernia     a. By EGD 10/2011.  . Valvular heart disease     a. Mild MR/mild AI/mild AS by echo 05/2011.    Scheduled Meds:  Scheduled Meds: . amiodarone  400 mg Oral BID  . arformoterol  15 mcg Nebulization BID  . atorvastatin  40 mg Oral QHS  . barrier cream  1 application Topical BID  . Chlorhexidine Gluconate Cloth  6 each Topical Q0600  . citalopram  10 mg Oral Daily  . diltiazem  180 mg Oral BID  . diphenhydrAMINE  25 mg Oral Q6H  .  docusate sodium  200 mg Oral BID  . ezetimibe  10 mg Oral Daily  . feeding supplement  237 mL Oral Q24H  . fluconazole  100 mg Oral Daily  . furosemide  160 mg Intravenous Q8H  . guaiFENesin  1,200 mg Oral BID  . hydrALAZINE  50 mg Oral BID  . imipenem-cilastatin  250 mg Intravenous Q6H  . insulin aspart  0-9 Units Subcutaneous TID WC  . insulin glargine  8 Units Subcutaneous QHS  . lidocaine  15 mL Mouth/Throat Once  . magnesium oxide  400 mg Oral Daily  . metolazone  5 mg Oral BID  . metoprolol succinate  25 mg Oral BID WC  . multivitamin  1 tablet Oral Daily  . mupirocin ointment  1 application Nasal BID  . pantoprazole  40 mg Oral Daily  . polyethylene glycol  17 g Oral BID  . potassium chloride  60 mEq Oral Q6H  . predniSONE  20 mg Oral Daily  . senna-docusate  3 tablet Oral QHS  . sodium chloride  3 mL Intravenous Q12H  .  terbinafine  250 mg Oral Daily  . tiotropium  18 mcg Inhalation Daily  . triamcinolone cream   Topical TID  . valACYclovir  1,000 mg Oral TID   Continuous Infusions: . sodium chloride 20 mL/hr at 09/11/12 2258    PHYSICAL EXAM Filed Vitals:   09/13/12 1615 09/13/12 2049 09/14/12 0633 09/14/12 0634  BP: 112/73 113/75 135/81   Pulse: 70 69 70   Temp: 98.7 F (37.1 C)  98.7 F (37.1 C)   TempSrc: Oral  Oral   Resp: 19 18 18    Height:      Weight:    197 lb 6.4 oz (89.54 kg)  SpO2: 100% 96% 99%     Well developed and nourished in no acute distress HENT normal Neck supple with JVP-7-8 w v wave coarse Regular rate and rhythm, no murmurs or gallops Abd-soft with active BS No Clubbing cyanosis less upper and lower extremity edema; limbs wrapped Skin-warm and dry A & Oriented  Grossly normal sensory and motor function  TELEMETRY: Reviewed telemetry pt in  afib:    Intake/Output Summary (Last 24 hours) at 09/14/12 0831 Last data filed at 09/14/12 0700  Gross per 24 hour  Intake   2278 ml  Output   6175 ml  Net  -3897 ml  Weight down 10   Lbs or so  LABS: Basic Metabolic Panel:  Recent Labs Lab 09/08/12 0540 09/08/12 1425 09/09/12 0500 09/10/12 0543 09/11/12 0500 09/13/12 0510 09/14/12 0500  NA 139 135 135 136 138 136 133*  K 3.7 4.2 3.6 3.5 4.0 3.3* 2.9*  CL 100 98 98 99 99 95* 91*  CO2 29 26 29 29 28  33* 33*  GLUCOSE 127* 336* 134* 133* 87 154* 138*  BUN 61* 62* 60* 63* 64* 55* 61*  CREATININE 2.13* 2.36* 2.28* 2.30* 2.45* 2.19* 2.46*  CALCIUM 8.1* 7.9* 8.2* 8.2* 8.3* 8.3* 8.6  PHOS  --   --   --   --   --  3.7 3.9   Cardiac Enzymes: No results found for this basename: CKTOTAL, CKMB, CKMBINDEX, TROPONINI,  in the last 72 hours CBC:  Recent Labs Lab 09/08/12 0540 09/09/12 0500 09/10/12 0543 09/11/12 0500 09/12/12 0705 09/13/12 0510 09/14/12 0500  WBC 19.6* 19.3* 20.9* 20.6* 15.7* 18.1* 15.2*  NEUTROABS  --  16.8*  --   --   --   --   --   HGB 11.7* 11.5* 11.6* 11.1* 10.7* 11.4* 11.4*  HCT 35.2* 33.8* 34.8* 32.3* 32.4* 33.9* 33.7*  MCV 92.9 91.1 91.8 91.2 93.4 92.4 91.6  PLT 360 347 353 301 318 335 325   PROTIME: No results found for this basename: LABPROT, INR,  in the last 72 hours Liver Function Tests:  Recent Labs  09/13/12 0510 09/14/12 0500  ALBUMIN 2.5* 2.5*   No results found for this basename: LIPASE, AMYLASE,  in the last 72 hours BNP: BNP (last 3 results)  Recent Labs  08/31/12 0500 09/06/12 0545 09/11/12 0500  PROBNP 7566.0* 1793.0* 1709.0*      ASSESSMENT AND PLAN:  Principal Problem:   Acute-on-chronic respiratory failure Active Problems:   CARCINOMA, LUNG, SQUAMOUS CELL   CHRONIC OBSTRUCTIVE PULMONARY DISEASE, SEVERE   HTN (hypertension)   DM (diabetes mellitus) type II controlled with renal manifestation   Atrial fibrillation   CKD (chronic kidney disease), stage III   Cellulitis of left ankle   Acute on chronic diastolic congestive heart failure   Leukocytosis   Elevation of cardiac enzymes  Hemoptysis   Pneumonia due to Gram-negative bacteria    Acute encephalopathy  Recurrent  Hemoptysis; i think we are at the point of abandoning the idea of anitcoagulation and cardioversion   Will see what pulm says today Mobilized fluid yesterday perhaps wraps, Cr stable  Would continue IV lasix one more day and change to demadex in am   Continue amio-- will need to make decision re amio also if we are not going to cardiovert  Renal function stable/improved contnue diuresis    Signed, Sherryl Manges MD  09/14/2012

## 2012-09-14 NOTE — Progress Notes (Signed)
Addendum  Patient seen and examined, chart and data base reviewed.  I agree with the above assessment and plan.  For full details please see Mrs. Algis Downs PA note.  Change to PO Lasix per nephrology, replete the potassium.   Clint Lipps, MD [facility] Pager: [phone] 09/14/2012, 11:35 AM

## 2012-09-15 LAB — RENAL FUNCTION PANEL
CO2: 34 mEq/L — ABNORMAL HIGH (ref 19–32)
Chloride: 91 mEq/L — ABNORMAL LOW (ref 96–112)
GFR calc Af Amer: 26 mL/min — ABNORMAL LOW (ref >90)
GFR calc non Af Amer: 23 mL/min — ABNORMAL LOW (ref >90)
Glucose, Bld: 145 mg/dL — ABNORMAL HIGH (ref 70–99)
Sodium: 133 mEq/L — ABNORMAL LOW (ref 135–145)

## 2012-09-15 LAB — GLUCOSE, CAPILLARY
Glucose-Capillary: 133 mg/dL — ABNORMAL HIGH (ref 70–99)
Glucose-Capillary: 165 mg/dL — ABNORMAL HIGH (ref 70–99)
Glucose-Capillary: 129 mg/dL — ABNORMAL HIGH (ref 70–99)

## 2012-09-15 LAB — CBC
HCT: 34.4 % — ABNORMAL LOW (ref 39.0–52.0)
MCHC: 33.4 g/dL (ref 30.0–36.0)
RDW: 15.2 % (ref 11.5–15.5)

## 2012-09-15 LAB — HEPARIN LEVEL (UNFRACTIONATED): Heparin Unfractionated: 0.83 IU/mL — ABNORMAL HIGH (ref 0.30–0.70)

## 2012-09-15 MED ORDER — AMIODARONE HCL 200 MG PO TABS
200.0000 mg | ORAL_TABLET | Freq: Two times a day (BID) | ORAL | Status: DC
  Administered 2012-09-15 – 2012-09-16 (×2): 200 mg via ORAL
  Filled 2012-09-15 (×3): qty 1

## 2012-09-15 MED ORDER — SODIUM CHLORIDE 0.9 % IV SOLN
1020.0000 mg | Freq: Once | INTRAVENOUS | Status: AC
  Administered 2012-09-15: 1020 mg via INTRAVENOUS
  Filled 2012-09-15: qty 34

## 2012-09-15 MED ORDER — TRIAMCINOLONE ACETONIDE 0.1 % EX CREA
TOPICAL_CREAM | Freq: Three times a day (TID) | CUTANEOUS | Status: DC
  Administered 2012-09-15 – 2012-09-16 (×3): via CUTANEOUS
  Filled 2012-09-15 (×7): qty 85

## 2012-09-15 MED ORDER — SORBITOL 70 % SOLN
960.0000 mL | TOPICAL_OIL | Freq: Once | ORAL | Status: AC
  Administered 2012-09-15: 960 mL via RECTAL
  Filled 2012-09-15: qty 240

## 2012-09-15 MED ORDER — TRIAMCINOLONE ACETONIDE 0.1 % EX CREA
TOPICAL_CREAM | Freq: Three times a day (TID) | CUTANEOUS | Status: DC
  Filled 2012-09-15: qty 85

## 2012-09-15 MED ORDER — HEPARIN BOLUS VIA INFUSION
3000.0000 [IU] | Freq: Once | INTRAVENOUS | Status: DC
  Filled 2012-09-15: qty 3000

## 2012-09-15 MED ORDER — ASPIRIN EC 81 MG PO TBEC
81.0000 mg | DELAYED_RELEASE_TABLET | Freq: Every day | ORAL | Status: DC
  Administered 2012-09-15 – 2012-09-16 (×2): 81 mg via ORAL
  Filled 2012-09-15 (×3): qty 1

## 2012-09-15 MED ORDER — FUROSEMIDE 80 MG PO TABS
160.0000 mg | ORAL_TABLET | Freq: Three times a day (TID) | ORAL | Status: DC
  Administered 2012-09-15 – 2012-09-16 (×3): 160 mg via ORAL
  Filled 2012-09-15 (×5): qty 2

## 2012-09-15 MED ORDER — HEPARIN (PORCINE) IN NACL 100-0.45 UNIT/ML-% IJ SOLN
1150.0000 [IU]/h | INTRAMUSCULAR | Status: DC
  Administered 2012-09-15: 1350 [IU]/h via INTRAVENOUS
  Administered 2012-09-16: 1150 [IU]/h via INTRAVENOUS
  Filled 2012-09-15 (×2): qty 250

## 2012-09-15 NOTE — Progress Notes (Signed)
Physical Therapy Treatment Patient Details Name: [name] ZIEMANN Sr. MRN: [id number] DOB: Jan 24, 1933 Today's Date: 09/15/2012 Time: 4098-1191 PT Time Calculation (min): 26 min  PT Assessment / Plan / Recommendation  PT Comments   Pt remains to have decreased endurance and activity tolerance however improved over the last week. Pt with good home set up and support. Pt safe to return home with HHPT and 24/7 assist/supervision. Pt able to complete 2 steps to enter home with assist from family.   Follow Up Recommendations  Home health PT;Supervision/Assistance - 24 hour     Does the patient have the potential to tolerate intense rehabilitation     Barriers to Discharge        Equipment Recommendations       Recommendations for Other Services    Frequency Min 3X/week   Progress towards PT Goals Progress towards PT goals: Progressing toward goals  Plan Current plan remains appropriate    Precautions / Restrictions Precautions Precautions: Fall Precaution Comments: pt on 4lo2 via [place] Restrictions Weight Bearing Restrictions: No   Pertinent Vitals/Pain Pt denies pain. Noted weeping from R hand - RN notified    Mobility  Bed Mobility Bed Mobility: Supine to Sit;Sit to Supine Supine to Sit: 6: Modified independent (Device/Increase time);HOB elevated Sitting - Scoot to Edge of Bed: 5: Supervision Sit to Supine: 6: Modified independent (Device/Increase time);HOB elevated Details for Bed Mobility Assistance: increased time, + SOB but safe technique Transfers Transfers: Sit to Stand;Stand to Sit Sit to Stand: 5: Supervision;With upper extremity assist;From bed;From chair/3-in-1 Stand to Sit: 5: Supervision;With upper extremity assist;To chair/3-in-1;To bed Details for Transfer Assistance: v/c's to push up from bed/chair Ambulation/Gait Ambulation/Gait Assistance: 4: Min guard Ambulation Distance (Feet): 75 Feet Assistive device: Rolling walker Ambulation/Gait Assistance Details: no  episodes of LOB Gait Pattern: Step-through pattern;Decreased stride length;Wide base of support Gait velocity: decr Stairs: Yes Stair Management Technique: One rail Right (HHA on Left, can reach both at home) Number of Stairs: 2 (to mimic home set up)    Exercises     PT Diagnosis:    PT Problem List:   PT Treatment Interventions:     PT Goals (current goals can now be found in the care plan section) Acute Rehab PT Goals PT Goal Formulation: With patient Time For Goal Achievement: 09/22/12 Potential to Achieve Goals: Good  Visit Information  Last PT Received On: 09/15/12 Assistance Needed: +1 (2nd person for lines/chair follow helpful) History of Present Illness: acute on chronic respiratory failure, CHF    Subjective Data  Subjective: Pt received supine in bed agreeable to PT. Spouse reports "he's suppose to go home tomorrow."   Cognition  Cognition Arousal/Alertness: Awake/alert Behavior During Therapy: WFL for tasks assessed/performed Overall Cognitive Status: Within Functional Limits for tasks assessed    Balance     End of Session PT - End of Session Equipment Utilized During Treatment: Gait belt;Oxygen Activity Tolerance: Patient tolerated treatment well Patient left: in chair;with nursing/sitter in room;with family/visitor present Nurse Communication: Mobility status   GP     [name] Stevenson 09/15/2012, 1:23 PM  [name] Stevenson, PT, DPT Pager #: [phone] Office #: [phone]

## 2012-09-15 NOTE — Progress Notes (Signed)
The patient's ace wraps were rewrapped.  Weeping was noted on his right upper extremity.

## 2012-09-15 NOTE — Progress Notes (Signed)
Placed patient on Cpap of 10, medium full face mask, and 4lpm 02 bleed in.  Patient is tolerating well

## 2012-09-15 NOTE — Progress Notes (Signed)
Patient Name: [name] EVILSIZER Sr.      SUBJECTIVE:       Weak SOB imporved  Feels stronger and walked yesterday  More hemoptysis      Past Medical History  Diagnosis Date  . Stricture and stenosis of esophagus 12/07/2004    EGD done on 12/07/2004  . GERD (gastroesophageal reflux disease)   . Hypoxemia     With chronic respiratory failure  . Increased prostate specific antigen (PSA) velocity 02/26/2011  . DM (diabetes mellitus) 02/26/2011  . Gout 02/26/2011  . COPD (chronic obstructive pulmonary disease)     oxygen at home, 4L  . OSA (obstructive sleep apnea) 02/26/2011    CPAP  . Paroxysmal atrial fibrillation   . Depression   . Cardiac arrest 2009    while in hospital   . Hypertension   . HTN (hypertension) 02/26/2011  . Cardiac arrest 2009    while in hospital  . Lung cancer     lung s/p lobectomy x2 on RT lung  . Basal cell carcinoma of nose 02/26/2011  . ETOH abuse 01/29/2012    started drinking again - hx cardiac arrest in 2009 due to dt's with organ failure  . CKD (chronic kidney disease) 02/28/2012  . Chronic diastolic CHF (congestive heart failure)     a. EF 55-60% by echo 2013.  . Sick sinus syndrome     a. s/p Medtronic pacemaker 2010.  . Bradycardia     a. Admitted 2010 with severe bradycardia, runs of paroxysmal VT.   Marland Kitchen Paroxysmal VT     a. In 2010 in setting of marked bradycardia.  Marland Kitchen CAD (coronary artery disease)     a. Nonobstructive CAD by cath 2006.  Marland Kitchen Esophageal ulcer     a. By EGD 10/2011.  Marland Kitchen Hiatal hernia     a. By EGD 10/2011.  . Valvular heart disease     a. Mild MR/mild AI/mild AS by echo 05/2011.    Scheduled Meds:  Scheduled Meds: . amiodarone  400 mg Oral BID  . arformoterol  15 mcg Nebulization BID  . atorvastatin  40 mg Oral QHS  . barrier cream  1 application Topical BID  . budesonide  0.5 mg Nebulization BID  . Chlorhexidine Gluconate Cloth  6 each Topical Q0600  . citalopram  10 mg Oral Daily  . diltiazem  180 mg Oral BID    . diphenhydrAMINE  25 mg Oral Q6H  . docusate sodium  200 mg Oral BID  . doxycycline  100 mg Oral Q12H  . ezetimibe  10 mg Oral Daily  . feeding supplement  237 mL Oral Q24H  . ferumoxytol  1,020 mg Intravenous Once  . fluconazole  100 mg Oral Daily  . furosemide  160 mg Oral Q6H  . guaiFENesin  1,200 mg Oral BID  . insulin aspart  0-9 Units Subcutaneous TID WC  . insulin glargine  8 Units Subcutaneous QHS  . lidocaine  15 mL Mouth/Throat Once  . magnesium oxide  400 mg Oral Daily  . metoprolol succinate  25 mg Oral BID WC  . multivitamin  1 tablet Oral Daily  . mupirocin ointment  1 application Nasal BID  . pantoprazole  40 mg Oral Daily  . polyethylene glycol  17 g Oral BID  . predniSONE  10 mg Oral QPC breakfast  . senna-docusate  3 tablet Oral QHS  . sodium chloride  3 mL Intravenous Q12H  . terbinafine  250 mg Oral  Daily  . tiotropium  18 mcg Inhalation Daily  . triamcinolone cream (KENALOG), silver sulfADIAZINE (SILVADENE) 1 %   Apply externally TID   Continuous Infusions:    PHYSICAL EXAM Filed Vitals:   09/14/12 2114 09/14/12 2316 09/15/12 0300 09/15/12 0834  BP: 134/72 134/76 141/85   Pulse: 70  71   Temp: 98.3 F (36.8 C)  97.3 F (36.3 C)   TempSrc: Oral     Resp: 20  20   Height:      Weight:   202 lb 13.2 oz (92 kg)   SpO2: 97%  98% 100%    Well developed and nourished in no acute distress HENT normal Neck supple with JVP-7-8 w v wave coarse Regular rate and rhythm, no murmurs or gallops Abd-soft with active BS No Clubbing cyanosis less upper and lower extremity edema; limbs wrapped Skin-warm and dry A & Oriented  Grossly normal sensory and motor function  TELEMETRY: Reviewed telemetry pt in  afib:    Intake/Output Summary (Last 24 hours) at 09/15/12 0849 Last data filed at 09/15/12 0729  Gross per 24 hour  Intake    963 ml  Output   4750 ml  Net  -3787 ml  Weight down 10  Lbs or so  LABS: Basic Metabolic Panel:  Recent Labs Lab  09/08/12 1425 09/09/12 0500 09/10/12 0543 09/11/12 0500  09/13/12 0510 09/14/12 0500 09/15/12 0455  NA 135 135 136 138  --  136 133* 133*  K 4.2 3.6 3.5 4.0  --  3.3* 2.9* 3.7  CL 98 98 99 99  --  95* 91* 91*  CO2 26 29 29 28   --  33* 33* 34*  GLUCOSE 336* 134* 133* 87  --  154* 138* 145*  BUN 62* 60* 63* 64*  --  55* 61* 66*  CREATININE 2.36* 2.28* 2.30* 2.45*  --  2.19* 2.46* 2.57*  CALCIUM 7.9* 8.2* 8.2* 8.3*  --  8.3* 8.6 8.6  PHOS  --   --   --   --   < > 3.7 3.9 3.3  < > = values in this interval not displayed. Cardiac Enzymes: No results found for this basename: CKTOTAL, CKMB, CKMBINDEX, TROPONINI,  in the last 72 hours CBC:  Recent Labs Lab 09/09/12 0500 09/10/12 0543 09/11/12 0500 09/12/12 0705 09/13/12 0510 09/14/12 0500 09/15/12 0455  WBC 19.3* 20.9* 20.6* 15.7* 18.1* 15.2* 15.7*  NEUTROABS 16.8*  --   --   --   --   --   --   HGB 11.5* 11.6* 11.1* 10.7* 11.4* 11.4* 11.5*  HCT 33.8* 34.8* 32.3* 32.4* 33.9* 33.7* 34.4*  MCV 91.1 91.8 91.2 93.4 92.4 91.6 91.7  PLT 347 353 301 318 335 325 303   PROTIME: No results found for this basename: LABPROT, INR,  in the last 72 hours Liver Function Tests:  Recent Labs  09/14/12 0500 09/15/12 0455  ALBUMIN 2.5* 2.6*   No results found for this basename: LIPASE, AMYLASE,  in the last 72 hours BNP: BNP (last 3 results)  Recent Labs  08/31/12 0500 09/06/12 0545 09/11/12 0500  PROBNP 7566.0* 1793.0* 1709.0*      ASSESSMENT AND PLAN:  Principal Problem:   Acute-on-chronic respiratory failure Active Problems:   CARCINOMA, LUNG, SQUAMOUS CELL   CHRONIC OBSTRUCTIVE PULMONARY DISEASE, SEVERE   HTN (hypertension)   DM (diabetes mellitus) type II controlled with renal manifestation   Atrial fibrillation   CKD (chronic kidney disease), stage III   Cellulitis  of left ankle   Acute on chronic diastolic congestive heart failure   Leukocytosis   Elevation of cardiac enzymes   Hemoptysis   Pneumonia due to  Gram-negative bacteria   Acute encephalopathy   Obstructive chronic bronchitis with exacerbation  Recurrent  Hemoptysis ; i think we are at the point of abandoning the idea of anitcoagulation and cardioversion         Continue amio-- will need to make decision re amio also if we are not going to cardiovert  Renal function stable/improved contnue diuresis  Should we use demadex instedad of lasix ?? Q to renal  I disagree with DrJD about the role of coumadin vs NOACs and if we do anticoagulate we will try and come to consensus    Signed, Sherryl Manges MD  09/15/2012

## 2012-09-15 NOTE — Progress Notes (Addendum)
ANTICOAGULATION CONSULT NOTE - Initial Consult  Pharmacy Consult for Heparin Indication: atrial fibrillation  No Known Allergies  Patient Measurements: Height: 5\' 7"  (170.2 cm) Weight: 202 lb 13.2 oz (92 kg) (Bed Scale) IBW/kg (Calculated) : 66.1 Heparin Dosing Weight:  ~ 74 kg  Vital Signs: Temp: 98.5 F (36.9 C) (07/01 1421) Temp src: Oral (07/01 1421) BP: 107/64 mmHg (07/01 1421) Pulse Rate: 68 (07/01 1421)  Labs:  Recent Labs  09/13/12 0510 09/14/12 0500 09/15/12 0455  HGB 11.4* 11.4* 11.5*  HCT 33.9* 33.7* 34.4*  PLT 335 325 303  CREATININE 2.19* 2.46* 2.57*   Estimated Creatinine Clearance: 26 ml/min (by C-G formula based on Cr of 2.57).  Medical History: Past Medical History  Diagnosis Date  . Stricture and stenosis of esophagus 12/07/2004    EGD done on 12/07/2004  . GERD (gastroesophageal reflux disease)   . Hypoxemia     With chronic respiratory failure  . Increased prostate specific antigen (PSA) velocity 02/26/2011  . DM (diabetes mellitus) 02/26/2011  . Gout 02/26/2011  . COPD (chronic obstructive pulmonary disease)     oxygen at home, 4L  . OSA (obstructive sleep apnea) 02/26/2011    CPAP  . Paroxysmal atrial fibrillation   . Depression   . Cardiac arrest 2009    while in hospital   . Hypertension   . HTN (hypertension) 02/26/2011  . Cardiac arrest 2009    while in hospital  . Lung cancer     lung s/p lobectomy x2 on RT lung  . Basal cell carcinoma of nose 02/26/2011  . ETOH abuse 01/29/2012    started drinking again - hx cardiac arrest in 2009 due to dt's with organ failure  . CKD (chronic kidney disease) 02/28/2012  . Chronic diastolic CHF (congestive heart failure)     a. EF 55-60% by echo 2013.  . Sick sinus syndrome     a. s/p Medtronic pacemaker 2010.  . Bradycardia     a. Admitted 2010 with severe bradycardia, runs of paroxysmal VT.   Marland Kitchen Paroxysmal VT     a. In 2010 in setting of marked bradycardia.  Marland Kitchen CAD (coronary artery  disease)     a. Nonobstructive CAD by cath 2006.  Marland Kitchen Esophageal ulcer     a. By EGD 10/2011.  Marland Kitchen Hiatal hernia     a. By EGD 10/2011.  . Valvular heart disease     a. Mild MR/mild AI/mild AS by echo 05/2011.   Assessment: 79 yo male with multiple medical problems and now new onset Afib with RVR.  We have been asked to begin IV Heparin for him.  His CBC is stable as well as his platelets.  No noted bleeding complications.  He is obese at 92 kg and we will use an adjusted weight of 74 kg for dosing.  Goal of Therapy:  Heparin level 0.3-0.7 units/ml Monitor platelets by anticoagulation protocol: Yes   Plan:  1.  Begin IV heparin without a bolus with recent bleeding and then IV bolus and IV heparin at 1350 units/hr. 2.  Obtain a heparin level 6-7 hours after starting and then daily heparin level and CBC.  Nadara Mustard, PharmD., MS Clinical Pharmacist Pager:  [phone] Thank you for allowing pharmacy to be part of this patients care team. 09/15/2012,2:47 PM

## 2012-09-15 NOTE — Progress Notes (Signed)
Addendum  Patient seen and examined, chart and data base reviewed.  I agree with the above assessment and plan.  For full details please see Mrs. Algis Downs PA note.  Much improved with diuresis, appreciated all other specialists  input. Advised to continue 4 extremities wrapping at home.   Clint Lipps, MD [facility] Pager: [phone] 09/15/2012, 1:19 PM

## 2012-09-15 NOTE — Progress Notes (Signed)
PATIENT DETAILS Name: [name] CORRALES Sr. Age: 79 y.o. Sex: male Date of Birth: 1932/11/28 Admit Date: 08/27/2012 Admitting Physician Clydia Llano, MD [id number] [name], MD   Approaching discharge.  He has completed antibiotics for ESBL PNA.  He is being diuresed with oral lasix.  Do not anticipate restarting anticoagulation until the lung has had a chance to heal.  Likely D/C on low dose aspirin pending pulmonology's opinion.    Subjective: Receiving neb treatment.  Feels much better.  Appears to have more energy. Still with weeping on his arms.  No bowel movement.  1 small sputum covered old brown clot of blood in the cup.  Assessment/Plan:  Acute on CHRONIC OBSTRUCTIVE PULMONARY DISEASE, SEVERE - No respiratory distress. Continues on oxygen via n/c - Cont supportive care with nebs/MDI  - prednisone decreased to 10 mg on 6/30.  slow taper  Acute on chronic diastolic heart failure -clinically, much improved with diuresis. -CXR 6/27 with fluid overload and PNA.  BNP is stable at 1700. - strict Is&Os  - Creatinine slowly trending up (likely due to diuresis) [facility] was consulted for assistance 6/27 - Do not lie flat, keep head of bed raised. - 2 gm sodium diet, 1200 ml fluid restriction. -Heart failure team was consulted and they recommended all 4 extremity wrapping.  Acute on CKD (chronic kidney disease), stage III  -[facility] consulted 6/27 to assist with diuresis and renal function. -Diuretics changed to lasix 160 mg po q 6  -Clinically patient is improved, although creatinine up to 2.66 (7/1)  Anasarca Rosalene Billings /pulmonary edema  - please see above under Acute on chronic CKD - Very difficult situation - In afib, with low albumin - Have requested assistance from both the CHF and Nephrology services. (6/27).  Hemoptysis -Appears to be resolving.   -?Timing and need to restart anticoagulation  - will start low dose aspirin for now.   -Etiology? Recurrence of  underlying malignancy vs PNA. -Hgb relatively stable -Consulted ENT (Dr. [name]) 6/26 who performed fiberoptic laryngoscopy and found no pathology in the upper most respiratory/gi tract. -Bronchoscopy was done on 6/17 then redone on 6/27.  showed a lesion along the old suture line, concerning for recurrence.    Path from 6/27 negative for malignancy.  PNA -BAL positive for ESBL E Coli and Candida albicans -completed 9 days of levaquin -completed 10 days of Primaxin as of 6/30. -Oral Diflucan.  Rash on buttocks,  Scrotum, lower legs, groin and feet. -Spoke with Dr. [name] (patient's Dermatologist) on the telephone.  She feels this is at least partially a disseminated fungal infection as he has had extensive fungal infections in the past and is significantly immunocompromised. -Lamisil 250 mg po qd x 14 days per Dr. Swaziland - changed back to oral diflucan 6/28. -triamcinolone / silvadene cream TID to affected areas. -Completed 7 days of valtrex for possible shingles on buttocks. -Avoid Gabapentin as it caused neurological changes.  Atrial fibrillation - off anti-coagulation given hemoptysis - On metoprolol and Cardizem for rate control -Tolerating Amiodarone well  Dysarthia (6/25) -Resolved.  Likely the result of gabapentin initiated 6/24. D/c'd 6/25. -Neuro consulted.  CT head negative for stroke.  Leukocytosis - WBC started to trend down. - Uncertain etiology. Multifactorial from possible pneumonia, on steroids - Monitor  Hypertension - Controlled- continue with current medication  Diabetes - Lantus decreased to 8 units, continue with SSI-s - patient with mild hypoglycemia this am (6/27)  CORONARY ARTERY DISEASE (nonobstructive disease 2006 catheterization)/Elevation of  cardiac enzymes  -no CP and likely due to acute HF, demand ischemia from recent hypoxia and worsened renal function - Cardiology following  Cellulitis of left ankle  -has decreased rednessboth legs c/w  stasis dermatitis- resolved   NSCLC s/p RULectomy and adjuvant chemo in 2006 - Path from 6/27 bronch biopsy is negative for malignancy. - Does have a new lung nodule-will need close followup as an outpatient - Oncology has seen this admission, and will monitor lung nodule outpatient.  Disposition: Remain inpatient.  Transfer to 4700.    DVT Prophylaxis:  SCD's  Code Status: Full code  Family Communication Spouse at bedside  Procedures:  None  CONSULTS: Cardiology Pulmonology Infectious disease. Ear Nose and throat Oncology Dermatology Nephrology Advanced Heart Failure Team   MEDICATIONS: Scheduled Meds: . amiodarone  400 mg Oral BID  . arformoterol  15 mcg Nebulization BID  . atorvastatin  40 mg Oral QHS  . barrier cream  1 application Topical BID  . budesonide  0.5 mg Nebulization BID  . Chlorhexidine Gluconate Cloth  6 each Topical Q0600  . citalopram  10 mg Oral Daily  . diltiazem  180 mg Oral BID  . diphenhydrAMINE  25 mg Oral Q6H  . docusate sodium  200 mg Oral BID  . doxycycline  100 mg Oral Q12H  . ezetimibe  10 mg Oral Daily  . feeding supplement  237 mL Oral Q24H  . ferumoxytol  1,020 mg Intravenous Once  . fluconazole  100 mg Oral Daily  . furosemide  160 mg Oral Q6H  . guaiFENesin  1,200 mg Oral BID  . insulin aspart  0-9 Units Subcutaneous TID WC  . insulin glargine  8 Units Subcutaneous QHS  . lidocaine  15 mL Mouth/Throat Once  . magnesium oxide  400 mg Oral Daily  . metoprolol succinate  25 mg Oral BID WC  . multivitamin  1 tablet Oral Daily  . mupirocin ointment  1 application Nasal BID  . pantoprazole  40 mg Oral Daily  . polyethylene glycol  17 g Oral BID  . predniSONE  10 mg Oral QPC breakfast  . senna-docusate  3 tablet Oral QHS  . sodium chloride  3 mL Intravenous Q12H  . sorbitol, milk of mag, mineral oil, glycerin (SMOG) enema  960 mL Rectal Once  . terbinafine  250 mg Oral Daily  . tiotropium  18 mcg Inhalation Daily  .  triamcinolone cream (KENALOG), silver sulfADIAZINE (SILVADENE) 1 %   Apply externally TID   Continuous Infusions:   PRN Meds:.ALPRAZolam, Glycerin (Adult), levalbuterol, morphine injection, nitroGLYCERIN, ondansetron (ZOFRAN) IV, ondansetron, oxyCODONE, polyvinyl alcohol, sodium chloride, traZODone  Antibiotics: Anti-infectives   Start     Dose/Rate Route Frequency Ordered Stop   09/14/12 1430  doxycycline (VIBRA-TABS) tablet 100 mg     100 mg Oral Every 12 hours 09/14/12 1326     09/13/12 1330  fluconazole (DIFLUCAN) tablet 100 mg     100 mg Oral Daily 09/13/12 1213     09/11/12 1000  terbinafine (LAMISIL) tablet 250 mg     250 mg Oral Daily 09/11/12 0724 09/25/12 0959   09/08/12 1100  fluconazole (DIFLUCAN) tablet 100 mg  Status:  Discontinued     100 mg Oral Daily 09/08/12 1013 09/11/12 0724   09/07/12 1600  valACYclovir (VALTREX) tablet 1,000 mg     1,000 mg Oral 3 times daily 09/07/12 1303 09/14/12 0952   09/04/12 1200  imipenem-cilastatin (PRIMAXIN) 250 mg in sodium chloride 0.9 %  100 mL IVPB  Status:  Discontinued     250 mg 200 mL/hr over 30 Minutes Intravenous 4 times per day 09/04/12 0856 09/14/12 1037   08/29/12 1200  levofloxacin (LEVAQUIN) tablet 500 mg  Status:  Discontinued     500 mg Oral Every 48 hours 08/29/12 0957 09/04/12 1008   08/27/12 0900  vancomycin (VANCOCIN) 1,250 mg in sodium chloride 0.9 % 250 mL IVPB  Status:  Discontinued     1,250 mg 166.7 mL/hr over 90 Minutes Intravenous Every 24 hours 08/27/12 0826 08/28/12 1809   08/27/12 0900  piperacillin-tazobactam (ZOSYN) IVPB 3.375 g  Status:  Discontinued     3.375 g 12.5 mL/hr over 240 Minutes Intravenous 3 times per day 08/27/12 0826 08/28/12 1809   08/27/12 0600  levofloxacin (LEVAQUIN) IVPB 500 mg     500 mg 100 mL/hr over 60 Minutes Intravenous  Once 08/27/12 0554 08/27/12 0735       PHYSICAL EXAM: Vital signs in last 24 hours: Filed Vitals:   09/14/12 2316 09/15/12 0300 09/15/12 0834 09/15/12  1000  BP: 134/76 141/85  122/83  Pulse:  71  69  Temp:  97.3 F (36.3 C)  98.4 F (36.9 C)  TempSrc:    Oral  Resp:  20  20  Height:      Weight:  92 kg (202 lb 13.2 oz)    SpO2:  98% 100% 100%    Weight change: 2.46 kg (5 lb 6.8 oz) Filed Weights   09/13/12 0521 09/14/12 0634 09/15/12 0300  Weight: 92.4 kg (203 lb 11.3 oz) 89.54 kg (197 lb 6.4 oz) 92 kg (202 lb 13.2 oz)   Body mass index is 31.76 kg/(m^2).   Gen Exam: Awake and alert, appears improved.  Always pleasant Neck: Supple, No JVD.   Chest: minimal crackles, good air movement CVS: S1 S2 Regular, no murmurs. Difficult to hear Abdomen: soft, BS +, non tender, non distended.  Extremities anasarca with weeping of upper extremities R>L.  Lower extremities wrapped with ace.  Neurologic:  No acute changes, Non focal Skin: rash improving.   Intake/Output from previous day:  Intake/Output Summary (Last 24 hours) at 09/15/12 1015 Last data filed at 09/15/12 0934  Gross per 24 hour  Intake   1083 ml  Output   4750 ml  Net  -3667 ml     LAB RESULTS: CBC  Recent Labs Lab 09/09/12 0500  09/11/12 0500 09/12/12 0705 09/13/12 0510 09/14/12 0500 09/15/12 0455  WBC 19.3*  < > 20.6* 15.7* 18.1* 15.2* 15.7*  HGB 11.5*  < > 11.1* 10.7* 11.4* 11.4* 11.5*  HCT 33.8*  < > 32.3* 32.4* 33.9* 33.7* 34.4*  PLT 347  < > 301 318 335 325 303  MCV 91.1  < > 91.2 93.4 92.4 91.6 91.7  MCH 31.0  < > 31.4 30.8 31.1 31.0 30.7  MCHC 34.0  < > 34.4 33.0 33.6 33.8 33.4  RDW 15.5  < > 15.7* 15.6* 15.3 15.1 15.2  LYMPHSABS 1.5  --   --   --   --   --   --   MONOABS 0.9  --   --   --   --   --   --   EOSABS 0.0  --   --   --   --   --   --   BASOSABS 0.0  --   --   --   --   --   --   < > =  values in this interval not displayed.  Chemistries   Recent Labs Lab 09/10/12 0543 09/11/12 0500 09/13/12 0510 09/14/12 0500 09/15/12 0455  NA 136 138 136 133* 133*  K 3.5 4.0 3.3* 2.9* 3.7  CL 99 99 95* 91* 91*  CO2 29 28 33* 33* 34*   GLUCOSE 133* 87 154* 138* 145*  BUN 63* 64* 55* 61* 66*  CREATININE 2.30* 2.45* 2.19* 2.46* 2.57*  CALCIUM 8.2* 8.3* 8.3* 8.6 8.6    CBG:  Recent Labs Lab 09/13/12 2131 09/14/12 0550 09/14/12 1104 09/14/12 1622 09/14/12 2137  GLUCAP 177* 122* 173* 254* 149*    Urine Studies MICROBIOLOGY: Recent Results (from the past 240 hour(s))  MRSA PCR SCREENING     Status: Abnormal   Collection Time    09/11/12  2:06 PM      Result Value Range Status   MRSA by PCR POSITIVE (*) NEGATIVE Final   Comment:            The GeneXpert MRSA Assay (FDA     approved for NASAL specimens     only), is one component of a     comprehensive MRSA colonization     surveillance program. It is not     intended to diagnose MRSA     infection nor to guide or     monitor treatment for     MRSA infections.     RESULT CALLED TO, READ BACK BY AND VERIFIED WITH:     MILLER RN 15:40 09/11/12 ([email or web address])  CULTURE, BLOOD (ROUTINE X 2)     Status: None   Collection Time    09/13/12  2:11 PM      Result Value Range Status   Specimen Description BLOOD RIGHT ARM   Final   Special Requests BOTTLES DRAWN AEROBIC AND ANAEROBIC 10CC   Final   Culture  Setup Time 09/13/2012 19:02   Final   Culture     Final   Value:        BLOOD CULTURE RECEIVED NO GROWTH TO DATE CULTURE WILL BE HELD FOR 5 DAYS BEFORE ISSUING A FINAL NEGATIVE REPORT   Report Status PENDING   Incomplete  CULTURE, BLOOD (ROUTINE X 2)     Status: None   Collection Time    09/13/12  2:11 PM      Result Value Range Status   Specimen Description BLOOD RIGHT HAND   Final   Special Requests BOTTLES DRAWN AEROBIC AND ANAEROBIC 10CC   Final   Culture  Setup Time 09/13/2012 19:02   Final   Culture     Final   Value:        BLOOD CULTURE RECEIVED NO GROWTH TO DATE CULTURE WILL BE HELD FOR 5 DAYS BEFORE ISSUING A FINAL NEGATIVE REPORT   Report Status PENDING   Incomplete  CULTURE, EXPECTORATED SPUTUM-ASSESSMENT     Status: None   Collection Time     09/14/12  5:24 PM      Result Value Range Status   Specimen Description SPUTUM   Final   Special Requests Normal   Final   Sputum evaluation     Final   Value: THIS SPECIMEN IS ACCEPTABLE. RESPIRATORY CULTURE REPORT TO FOLLOW.   Report Status 09/14/2012 FINAL   Final  CULTURE, RESPIRATORY (NON-EXPECTORATED)     Status: None   Collection Time    09/14/12  5:24 PM      Result Value Range Status   Specimen Description SPUTUM  Final   Special Requests NONE   Final   Gram Stain     Final   Value: NO WBC SEEN     NO SQUAMOUS EPITHELIAL CELLS SEEN     NO ORGANISMS SEEN   Culture Culture reincubated for better growth   Final   Report Status PENDING   Incomplete    RADIOLOGY STUDIES/RESULTS: Dg Chest 2 View  09/01/2012   *RADIOLOGY REPORT*  Clinical Data: Shortness of breath.  CHEST - 2 VIEW  Comparison: 08/31/2012.  Findings: The left subclavian Port-A-Cath tip it is most likely in the mid SVC at the level of the carina.  Stable pacer wires.  The heart is enlarged but unchanged.  Persistent bilateral infiltrates and effusions.  IMPRESSION:  1.  The Port-A-Cath tip appears to be in the mid SVC. 2.  Persistent bilateral infiltrates and small effusions.   Original Report Authenticated By: Rudie Meyer, [name]   Ct Chest Wo Contrast  08/30/2012   *RADIOLOGY REPORT*  Clinical Data: Hemoptysis.  Pneumonia versus edema.  CT CHEST WITHOUT CONTRAST  Technique:  Multidetector CT imaging of the chest was performed following the standard protocol without IV contrast.  Comparison: Chest radiograph 08/30/2012 and chest CT 02/05/2012  Findings: Heart is enlarged.  Cardiomegaly appears stable. Stable prominent fat density, consistent with lipoma, between the left and right atria.  Dual lead pacer is present.  There is heavy coronary artery atherosclerotic calcification of all three coronary arteries.  Aortic valvular calcifications are present.  A left IJ central venous catheter terminates in the superior vena  cava. Thyroid gland and thoracic inlet appear within normal limits.  Heavy atherosclerotic calcification of the thoracic aorta, without aneurysm.  Several mediastinal lymph nodes are visualized, and within normal limits for size.  No pathologically enlarged lymph nodes are seen in the thorax.  Evaluation for the hilar lymph nodes is somewhat limited without intravenous contrast.  Small bilateral pleural effusions are present.  Multilevel degenerative changes of the thoracic spine.  Multiple benign hemangiomas in the thoracic spine vertebral bodies.  No acute or suspicious osseous abnormality.  No acute findings are identified in the abdomen.  Surgical clips are seen in the left upper quadrant.  There are postsurgical changes of the right lower lobectomy.  There is a background of centrilobular emphysema.  Stable scarring in the right upper lobe anteriorly.  No definite intralobular septal thickening.  There is markedly asymmetric airspace disease in the inferior and posterior aspect of the right upper lobe, where there are areas of consolidation.  There are ground-glass opacities which are new compared to prior chest CT in the left upper lobe, left lower lobe, and right middle lobe.  5 mm pulmonary nodule in the left upper lobe peripherally on image #27 of the lung windows is new.  IMPRESSION:  1.  Markedly asymmetric bilateral airspace disease.  Airspace disease is most prominent, with areas of consolidation, in the posterior and inferior right upper lobe ( the patient has a history of right lower lobectomy).  Findings in the right upper lobe are favored to be due to pneumonia or aspiration. 2.  New ground-glass opacities in the left upper lobe and left lower lobe may be due to early infection.  Underlying mild pulmonary edema cannot be excluded in this patient with cardiomegaly and small bilateral pleural effusions. 3.  New 5 mm pulmonary nodule left upper lobe. If the patient is at high risk for bronchogenic  carcinoma, follow-up chest CT at 6-12 months is  recommended.  If the patient is at low risk for bronchogenic carcinoma, follow-up chest CT at 12 months is recommended.  This recommendation follows the consensus statement: Guidelines for Management of Small Pulmonary Nodules Detected on CT Scans: A Statement from the Fleischner Society as published in Radiology 2005; 237:395-400. 4.  Cardiomegaly with three-vessel coronary artery disease and dual lead cardiac pacer. 5.  Small bilateral pleural effusions 6.  Aortic valve calcifications. This finding raises the possibility of aortic valvular stenosis.  .   Original Report Authenticated By: Britta Mccreedy, [name]   Dg Chest Port 1 View  08/31/2012   *RADIOLOGY REPORT*  Clinical Data: Follow-up edema  PORTABLE CHEST - 1 VIEW  Comparison: Yesterday  Findings: The tip of the left subclavian Port-A-Cath has changed its orientation.  Based on a single view, it is likely in the azygos vein.  Right subclavian dual lead pacemaker device and leads are stable and intact.  Bilateral pleural effusions left greater than right are stable.  Diffuse edema is stable. Cardiomegaly.  IMPRESSION: Left subclavian Port-A-Cath tip has changed its position and is likely in the azygos vein.  This can be confirmed with a lateral view.  Stable airspace disease and pleural effusions.   Original Report Authenticated By: Jolaine Click, [name]   Dg Chest Port 1 View  08/30/2012   *RADIOLOGY REPORT*  Clinical Data: Respiratory failure.  PORTABLE CHEST - 1 VIEW  Comparison: 08/29/2012 and 08/27/2012  Findings: Prominent interstitial markings could represent underlying edema.  There are persistent interstitial and airspace densities in the right mid and lower lung region.  Heart size remains enlarged.  Again noted is a right dual lead cardiac pacemaker.  Left subclavian Port-A-Cath in the SVC region.  Aortic arch is heavily calcified.  IMPRESSION:  Prominent interstitial markings may represent underlying  edema. Again noted are increased densities in the right mid and lower lung region which could represent asymmetric edema but cannot exclude an infectious etiology.   Original Report Authenticated By: Richarda Overlie, [name]   Dg Chest Port 1 View  08/29/2012   *RADIOLOGY REPORT*  Clinical Data: Follow-up CHF  PORTABLE CHEST - 1 VIEW  Comparison: Prior chest x-ray 08/27/2012  Findings: Stable position of left subclavian central venous catheter with the tip in the mid superior vena cava.  Right subclavian approach cardiac rhythm maintenance device with leads in the right atrium and right ventricle.  Slightly improved inspiratory volumes with clearing in the right base.  Stable elevation of the left hemidiaphragm resulting in blunting of the left costophrenic angle.  Decreasing interstitial edema with the residual asymmetric patchy opacity in the right mid and lower lobe. Small residual right pleural effusion.  Stable cardiomegaly. Aortic atherosclerosis again noted.  No pneumothorax.  IMPRESSION:  1.  Decreasing pulmonary edema with improved inspiratory volumes and clearing in the right base. 2.  Residual asymmetric patchy opacity in the right mid and lower lung may reflect residual asymmetric alveolar edema, or pneumonia. 3.  Persistent small right pleural effusion, chronic elevation of the left hemidiaphragm and cardiomegaly.   Original Report Authenticated By: Malachy Moan, [name]   Dg Chest Portable 1 View  08/27/2012   *RADIOLOGY REPORT*  Clinical Data: Shortness of breath.  History of lung cancer post lobectomy.  PORTABLE CHEST - 1 VIEW  Comparison: 02/02/2012  Findings: Shallow inspiration.  Cardiac enlargement with pulmonary vascular congestion and perihilar edema.  Bilateral pleural effusions.  Atelectasis in the lung bases.  No pneumothorax. Changes have developed since the previous  study.  Postoperative changes in the right chest.  Stable appearance of cardiac pacemaker and left central venous catheter.   Calcified and tortuous aorta.  IMPRESSION: Interval development of cardiac enlargement, pulmonary vascular congestion, perihilar edema, and bilateral pleural effusions.   Original Report Authenticated By: Burman Nieves, [name]    [facility] Pager:[phone]  If 7PM-7AM, please contact night-coverage [email or web address] Password [email or web address] 09/15/2012, 10:15 AM   LOS: 19 days

## 2012-09-15 NOTE — Progress Notes (Signed)
PULMONARY  / CRITICAL CARE MEDICINE  Name: [name] MCKEONE Sr. MRN: [id number] DOB: [birth year]    ADMISSION DATE:  08/27/2012 CONSULTATION DATE:  08/28/2012  REFERRING MD :  Berton Mount PRIMARY SERVICE:  [facility]  CHIEF COMPLAINT:  Short of breath  BRIEF PATIENT DESCRIPTION:  79 yo male former smoker presented with severe dyspnea from acute pulmonary edema, acute diastolic dysfunction, and new onset A fib with RVR.  He is followed by Dr. [name] for severe COPD, chronic respiratory failure on home oxygen, OSA on BiPAP, and NSCLC s/p RULectomy and adjuvant chemo in 2006.  PCCM consulted to assist with respiratory management, and to assist determination whether he is candidate for cardioversion.  He was recently treated as outpt for Lt ankle cellulitis.  SIGNIFICANT EVENTS: 6/12> Admit with A fib RVR, acute diastolic heart failure 6/14> Hemoptysis on Xarelto=> stopped, placed on Levaquin/ Solumedrol. 6/15> wife reports similar lumps of bloody phlegm thru the night; seems diminished this AM, persist rhonchi/congestion.  STUDIES:  6/12 Echo >> mod LVH, EF 65 to 70%, mild/mod LA and RA dilation 615 CT chest>>>Markedly asymmetric bilateral airspace disease. Airspace disease is most prominent, with areas of consolidation, in the posterior and inferior right upper lobe ( the patient has a history of right lower lobectomy). Findings in the right upper lobe are favored to be due to pneumonia or aspiration. New ground-glass opacities in the left upper lobe and left lower lobe.  New 5 mm pulmonary nodule left upper lobe. 5. Small bilateral pleural effusions   LINES / TUBES: PIV  CULTURES: Blood 6/12 >>neg MRSA screen 6/12 >> POSITIVE BAL 6/17>>> rare GNR, rare yeast>>>E Coli (sens imipenem) and candida BCx2 6/29>>> Sputum 6/30>>>  PATHOLOGY 6/17 Bronchial washings>>>no malignant cells, multiple fungal elements 6/27 ENB RML>>>benign cells 6/27 Bronchial washing>>>no malignant cells, no fungal  6/27  Bronchial brushing RML>>>no malignant cells 6/27 EBBx: benign mucosa  ANTIBIOTICS: Doxycycline 6/10 >> 6/12 Vancomycin 6/12 >>6/14 Zosyn 6/12 >> 6/14 Levaquin 6/14(e coli in lung)>>>6/20 Imipenem 6/20 >>6/30  Doxycycline 6/30>>> Diflucan 6/29>>>   Subjective/Overnight: Tolerating CPAP 10 with 4L O2, one episode of light brown sputum production   VITAL SIGNS: Temp:  [97.3 F (36.3 C)-99.3 F (37.4 C)] 97.3 F (36.3 C) (07/01 0300) Pulse Rate:  [70-74] 71 (07/01 0300) Resp:  [19-20] 20 (07/01 0300) BP: (106-141)/(65-85) 141/85 mmHg (07/01 0300) SpO2:  [97 %-100 %] 100 % (07/01 0834) Weight:  [202 lb 13.2 oz (92 kg)] 202 lb 13.2 oz (92 kg) (07/01 0300) 4 liters [place]  PHYSICAL EXAMINATION: General:  Chronically ill appearing male, No distress, speaks in full sentences, using accessory muscles Neuro:  Alert, follows commands, normal strength, seems anxious HEENT:  Mm moist, no JVD Cardiovascular:  Irregular, tachycardic, no murmur Lungs:  Decreased breath sounds, basilar rales, no wheeze Abdomen:  Soft, non tender, + bowel sounds Musculoskeletal:  1+ leg edema R>L    Recent Labs Lab 09/13/12 0510 09/14/12 0500 09/15/12 0455  NA 136 133* 133*  K 3.3* 2.9* 3.7  CL 95* 91* 91*  CO2 33* 33* 34*  BUN 55* 61* 66*  CREATININE 2.19* 2.46* 2.57*  GLUCOSE 154* 138* 145*    Recent Labs Lab 09/13/12 0510 09/14/12 0500 09/15/12 0455  HGB 11.4* 11.4* 11.5*  HCT 33.9* 33.7* 34.4*  WBC 18.1* 15.2* 15.7*  PLT 335 325 303   Dg Chest 2 View  09/14/2012   *RADIOLOGY REPORT*  Clinical Data: Shortness of breath.  Evaluate infiltrates.  History of right upper  lobectomy for non-small cell lung cancer.  CHEST - 2 VIEW  Comparison: Chest radiograph 09/11/2012 and 09/01/2012 and chest CT 09/08/2012  Findings: Left chest Port-A-Cath terminates in the superior vena cava.  Right-sided dual lead pacemaker is stable.  Stable cardiomegaly.  There are surgical clips in the right hilar region.   There are small bilateral pleural effusions.  Left basilar opacity is favored to be due to atelectasis.  Aeration of the perihilar regions of both lungs and the right lung base has improved compared to the recent chest radiograph of 09/11/2012.  IMPRESSION: 1.  Overall, improving aeration of the lungs since 09/11/2012. 2.  Persistent small bilateral pleural effusions and bibasilar opacities.   Original Report Authenticated By: Britta Mccreedy, [name]    BNP (last 3 results)  Recent Labs  08/31/12 0500 09/06/12 0545 09/11/12 0500  PROBNP 7566.0* 1793.0* 1709.0*     ASSESSMENT / PLAN:  Hemoptysis in setting of COPD exac and anticoagulation for new AFib. - resolved Purulent bronchitis  - Sputum GS,cx sent 6/30 Plan: -Begin retrial anticoagulation (heparin infusion without bolus ordered)  Initiate warfarin in day or two if hemoptysis does not recur -abx as above, plan doxy for 5-7 days   New LUL 5mm pulm nodule.  No Endobronchial lesion seen on FOB. (Previously followed by Dr Welton Flakes for oncology)  Plan: F/u nodule later with imaging, no intervention at this time   Acute on chronic respiratory failure 2nd to acute pulmonary edema - clinically and radiographically improved.   Plan: -Cont oxygen with goal SpO2 > 90% -Prednisone 10mg  QD, plan for d/c on this dose with consideration for further taper to off in office  -CPAP qhs -continue GFN 1200mg  Bid -cont brovana nebulizer BID with prn xopenex nebulizer, spiriva, pulmicort   Renal Insuffic:    Plan: -lasix titration per Nephrology and Cardiology  Insomnia.  Plan: -prn xanax, trazodone  Canary Brim, NP-C [facility] Pgr: [phone] or [phone]  Billy Fischer, MD ; [facility] service Mobile [phone].  After 5:30 PM or weekends, call [phone]

## 2012-09-15 NOTE — Progress Notes (Signed)
Subjective: Interval History: has complaints better.  Objective: Vital signs in last 24 hours: Temp:  [97.3 F (36.3 C)-99.3 F (37.4 C)] 97.3 F (36.3 C) (07/01 0300) Pulse Rate:  [70-74] 71 (07/01 0300) Resp:  [19-20] 20 (07/01 0300) BP: (106-141)/(65-85) 141/85 mmHg (07/01 0300) SpO2:  [97 %-100 %] 100 % (07/01 0834) Weight:  [92 kg (202 lb 13.2 oz)] 92 kg (202 lb 13.2 oz) (07/01 0300) Weight change: 2.46 kg (5 lb 6.8 oz)  Intake/Output from previous day: 06/30 0701 - 07/01 0700 In: 963 [P.O.:960; I.V.:3] Out: 2975 [Urine:2975] Intake/Output this shift: Total I/O In: -  Out: 1775 [Urine:1775]  General appearance: alert, cooperative and plethoric Resp: diminished breath sounds bilaterally Cardio: irregularly irregular rhythm and systolic murmur: holosystolic 2/6, blowing at apex GI: pos bs,liver down 5 cm, soft Extremities: edema 2+  Lab Results:  Recent Labs  09/14/12 0500 09/15/12 0455  WBC 15.2* 15.7*  HGB 11.4* 11.5*  HCT 33.7* 34.4*  PLT 325 303   BMET:  Recent Labs  09/14/12 0500 09/15/12 0455  NA 133* 133*  K 2.9* 3.7  CL 91* 91*  CO2 33* 34*  GLUCOSE 138* 145*  BUN 61* 66*  CREATININE 2.46* 2.57*  CALCIUM 8.6 8.6   No results found for this basename: PTH,  in the last 72 hours Iron Studies:  Recent Labs  09/14/12 1130  IRON 37*  TIBC 332    Studies/Results: Dg Chest 2 View  09/14/2012   *RADIOLOGY REPORT*  Clinical Data: Shortness of breath.  Evaluate infiltrates.  History of right upper lobectomy for non-small cell lung cancer.  CHEST - 2 VIEW  Comparison: Chest radiograph 09/11/2012 and 09/01/2012 and chest CT 09/08/2012  Findings: Left chest Port-A-Cath terminates in the superior vena cava.  Right-sided dual lead pacemaker is stable.  Stable cardiomegaly.  There are surgical clips in the right hilar region.  There are small bilateral pleural effusions.  Left basilar opacity is favored to be due to atelectasis.  Aeration of the perihilar  regions of both lungs and the right lung base has improved compared to the recent chest radiograph of 09/11/2012.  IMPRESSION: 1.  Overall, improving aeration of the lungs since 09/11/2012. 2.  Persistent small bilateral pleural effusions and bibasilar opacities.   Original Report Authenticated By: Britta Mccreedy, [name]    I have reviewed the patient's current medications.  Assessment/Plan: 1 CKD 4 vol improving on po Lasix, can d/c on tid Lasix, cont qid while here if here 1-2 d.   2 Anemia stable, fe low , replete 3 DM controlled 4 COPD stable 5 Hemoptysis per Pulm  6 Afib Must use Coumdin at his low and falling GFR 7 HPTH P 8  P Lasix po, Fe iv, Coumadin    LOS: 19 days   [name] Stevenson L 09/15/2012,8:40 AM

## 2012-09-15 NOTE — Progress Notes (Signed)
Spoke with pt about readiness for nighttime BIPAP, patient requests for RT to check back approximately around 2300.

## 2012-09-16 ENCOUNTER — Telehealth: Payer: Self-pay | Admitting: Emergency Medicine

## 2012-09-16 DIAGNOSIS — J449 Chronic obstructive pulmonary disease, unspecified: Secondary | ICD-10-CM

## 2012-09-16 DIAGNOSIS — I5033 Acute on chronic diastolic (congestive) heart failure: Secondary | ICD-10-CM

## 2012-09-16 DIAGNOSIS — J9620 Acute and chronic respiratory failure, unspecified whether with hypoxia or hypercapnia: Secondary | ICD-10-CM

## 2012-09-16 DIAGNOSIS — I4891 Unspecified atrial fibrillation: Secondary | ICD-10-CM

## 2012-09-16 LAB — CBC
HCT: 33.4 % — ABNORMAL LOW (ref 39.0–52.0)
Hemoglobin: 11.4 g/dL — ABNORMAL LOW (ref 13.0–17.0)
MCHC: 34.1 g/dL (ref 30.0–36.0)
MCV: 92 fL (ref 78.0–100.0)
RDW: 15.2 % (ref 11.5–15.5)

## 2012-09-16 LAB — GLUCOSE, CAPILLARY: Glucose-Capillary: 142 mg/dL — ABNORMAL HIGH (ref 70–99)

## 2012-09-16 MED ORDER — BUDESONIDE 0.25 MG/2ML IN SUSP: 0.5000 mg | Freq: Two times a day (BID) | RESPIRATORY_TRACT | Status: AC

## 2012-09-16 MED ORDER — GUAIFENESIN ER 600 MG PO TB12: 1200.0000 mg | ORAL_TABLET | Freq: Two times a day (BID) | ORAL | Status: AC

## 2012-09-16 MED ORDER — HEPARIN SOD (PORK) LOCK FLUSH 100 UNIT/ML IV SOLN
500.0000 [IU] | INTRAVENOUS | Status: AC | PRN
  Administered 2012-09-16: 500 [IU]

## 2012-09-16 MED ORDER — OXYCODONE HCL 5 MG PO TABS: 5.0000 mg | ORAL_TABLET | ORAL | Status: DC | PRN

## 2012-09-16 MED ORDER — LEVALBUTEROL HCL 0.63 MG/3ML IN NEBU: 0.6300 mg | INHALATION_SOLUTION | RESPIRATORY_TRACT | Status: AC | PRN

## 2012-09-16 MED ORDER — INSULIN ASPART 100 UNIT/ML ~~LOC~~ SOLN: SUBCUTANEOUS | Status: DC

## 2012-09-16 MED ORDER — METOPROLOL SUCCINATE ER 25 MG PO TB24: 25.0000 mg | ORAL_TABLET | Freq: Two times a day (BID) | ORAL | Status: AC

## 2012-09-16 MED ORDER — FUROSEMIDE 80 MG PO TABS: 160.0000 mg | ORAL_TABLET | Freq: Three times a day (TID) | ORAL | Status: AC

## 2012-09-16 MED ORDER — FLUCONAZOLE 100 MG PO TABS: 100.0000 mg | ORAL_TABLET | Freq: Every day | ORAL | Status: AC

## 2012-09-16 MED ORDER — ARFORMOTEROL TARTRATE 15 MCG/2ML IN NEBU: 15.0000 ug | INHALATION_SOLUTION | Freq: Two times a day (BID) | RESPIRATORY_TRACT | Status: AC

## 2012-09-16 MED ORDER — BD GETTING STARTED TAKE HOME KIT: 3/10ML X 30G SYRINGES
1.0000 | Freq: Once | Status: AC
  Administered 2012-09-16: 1
  Filled 2012-09-16: qty 1

## 2012-09-16 MED ORDER — INSULIN ASPART 100 UNIT/ML ~~LOC~~ SOLN: 0.0000 [IU] | Freq: Three times a day (TID) | SUBCUTANEOUS | Status: DC

## 2012-09-16 MED ORDER — PREDNISONE 5 MG PO TABS: 10.0000 mg | ORAL_TABLET | Freq: Every day | ORAL | Status: AC

## 2012-09-16 MED ORDER — SENNOSIDES-DOCUSATE SODIUM 8.6-50 MG PO TABS: 3.0000 | ORAL_TABLET | Freq: Every day | ORAL | Status: AC

## 2012-09-16 MED ORDER — "INSULIN SYRINGE-NEEDLE U-100 31G X 5/16"" 0.3 ML MISC": 1.0000 | Freq: Four times a day (QID) | Status: AC

## 2012-09-16 MED ORDER — DILTIAZEM HCL ER COATED BEADS 180 MG PO CP24: 180.0000 mg | ORAL_CAPSULE | Freq: Two times a day (BID) | ORAL | Status: AC

## 2012-09-16 MED ORDER — AMIODARONE HCL 200 MG PO TABS: 200.0000 mg | ORAL_TABLET | Freq: Two times a day (BID) | ORAL | Status: AC

## 2012-09-16 MED ORDER — ENSURE COMPLETE PO LIQD: 237.0000 mL | ORAL | Status: AC

## 2012-09-16 MED ORDER — LEVALBUTEROL HCL 0.63 MG/3ML IN NEBU: 1.0000 | INHALATION_SOLUTION | RESPIRATORY_TRACT | Status: DC | PRN

## 2012-09-16 MED ORDER — INSULIN GLARGINE 100 UNIT/ML ~~LOC~~ SOLN: 8.0000 [IU] | Freq: Every day | SUBCUTANEOUS | Status: AC

## 2012-09-16 NOTE — Telephone Encounter (Signed)
Called, spoke with pt's wife. Pt just came home today after being in the hospital x 3 wks. Wife is unable to lift pt and is requesting order to be sent to [facility] for a lift.  I spoke with RB.  Ok to order a lift for pt.  Also, pls place order for a Home Health assessment.  I spoke with pt's wife.  Informed her of above.  She is aware orders have been placed and was very grateful for this.

## 2012-09-16 NOTE — Progress Notes (Signed)
Skin underneath ace wraps was checked.  All ace wraps were rewrapped.

## 2012-09-16 NOTE — Discharge Summary (Signed)
Physician Discharge Summary  [name] Newcomer Sr. AVW:[id number] DOB: 1933/01/11 DOA: 08/27/2012  PCP: [name], MD Nephrologist:  Dr. Briant Cedar Pulmonologist:  Dr. [name] Cardiologist:  Drs. Brackbill and Graciela Husbands  Admit date: 08/27/2012 Discharge date: 09/16/2012  Time spent: 60 minutes  Recommendations for Outpatient Follow-up:   Home Health RN, PT, Aide  Patient has been started on insulin (metformin discontinued due to kidney failure) will need on-going DM management  He will follow up with Nephrology in 2 weeks for diuresis and on-going CKD  He will follow up with Cardiology to determine whether or not cardioversion is appropriate in the future and on-going management of D-CHF  He will follow up with pulmonology for recent hemoptysis and prednisone tapering  He will follow up with Oncology in the next 2 months for monitoring of new lung nodule.  Discharge Diagnoses:  Principal Problem:   Acute-on-chronic respiratory failure Active Problems:   CARCINOMA, LUNG, SQUAMOUS CELL   CHRONIC OBSTRUCTIVE PULMONARY DISEASE, SEVERE   HTN (hypertension)   DM (diabetes mellitus) type II controlled with renal manifestation   Atrial fibrillation   CKD (chronic kidney disease), stage III   Cellulitis of left ankle   Acute on chronic diastolic congestive heart failure   Leukocytosis   Elevation of cardiac enzymes   Hemoptysis   Pneumonia due to Gram-negative bacteria   Acute encephalopathy   Obstructive chronic bronchitis with exacerbation   Discharge Condition: stablizing  Diet recommendation: low salt heart healthy  Filed Weights   09/14/12 0634 09/15/12 0300 09/16/12 0722  Weight: 89.54 kg (197 lb 6.4 oz) 92 kg (202 lb 13.2 oz) 87.091 kg (192 lb)    History of present illness at the time of admission:  [name] HARREL Sr. is a 79 y.o. male with past medical history of chronic respiratory failure on 4 L of oxygen at home secondary to COPD, OSA, CHF, A. fib and CKD. Patient came  into the hospital because of shortness of breath. The history obtained mainly from his wife as he is on BiPAP. Patient reported having shortness of breath for the past 4-5 days, this is associated with some cough, and subjective fevers. Patient was recently started on doxycycline for presumed cellulitis around his left heel. The shortness of breath progressively gotten worse so he came into the hospital last night with severe SOB. Patient placed on BiPAP, initial evaluation in the ED showed chest x-ray negative for pneumonia, there is small bilateral effusions and vascular congestion consistent with CHF. Patient also has WBC count of 21K. Patient admitted to the hospital for further evaluation.   Hospital Course:   PNA  -Bronchial Lavage culture was positive for ESBL E Coli and Candida albicans  -completed 9 days of levaquin  -Infectious disease was consulted and recommend that the patient complete 10 days of Primaxin.  Completed as of 6/30.  -Symptoms are much improved. -He was started on oral diflucan inpatient and will finish his course of treatment with 5 more doses outpatient.  Acute on chronic diastolic heart failure  -Exacerbated by P-afib -Patient was difficult to diuresis but is now clinically much improved with diuresis. He will be discharged on 160 mg of lasix TID to be adjusted by nephrology in follow up in 2 weeks. -There were some difficulties tracking I&Os in the hospital but it is estimated that he diuresed 24L. -BNP is stable at 1700.  -Creatinine slowly trending up (likely due to diuresis) [facility] was consulted for assistance. He sees Dr. Briant Cedar outpatient. -  2 gm sodium diet, 1200 ml fluid restriction.  -Heart failure team was consulted and they recommended wrapping of all 4 extremities which helped greatly with his anasarca.  Atrial fibrillation  -[facility], Dr. Graciela Husbands, followed the patient through out his stay. -[name] was originally started on  anticoagulation in preparation for cardioversion but developed hemoptysis so the anticoag was discontinued.   -He was started on amiodarone at 400 mg bid.  This was decreased to 200 mg bid 24 hours before discharge. -On metoprolol and Cardizem for rate control  -Per EP's note:  continue amiodarone for now but will need to make decision about long term amio use if we are not going to cardiovert.   Hemoptysis  -[facility] followed the patient thru out his hospital stay. -The patient began coughing up bright red blood with clots after being on a heparin drip for two days and having one dose of Xeralto. -All anticoagulation was discontinued.  Unfortunately the patient continued to cough up blood for approximately two weeks.   -Bronchoscopy was done on 6/17.  Bleeding was noted but the source was not found.  Bronchial washings did not show malignant cells on path. -ENT was consulted (Dr. [name]) 6/26.  They performed fiberoptic laryngoscopy and found no pathology in the upper most respiratory/gi tract.  -A second Bronchoscopy was done on  6/27. This time a lesion along the RML suture line, from previous lobectomy, was found.  Endobronchial biopsies were obtained.  Path from 6/27 biopsies are negative for malignancy.  -The hemoptysis finally trailed off on 7/1 and on 7/2 he has had no hemoptysis.   -His hemoglobin has not dropped significantly.  It is 11.4 on discharge. -After discussions between the primary team and cardiology it was decided to wait several weeks and then to consider whether or not anticoagulation will be restarted.    Acute on CKD (chronic kidney disease), stage III  -[facility] consulted 6/27 to assist with diuresis and renal function.  -Metolazone was added to lasix as the patient seemed to be lasix resistant.  Metolazone was discontinued after 2 days. -On discharge, diuretics were changed to lasix 160 mg po q 8  -Clinically patient is improved, although creatinine up  to 2.66 (7/1)   Acute on CHRONIC OBSTRUCTIVE PULMONARY DISEASE, SEVERE  - No respiratory distress. Continues on oxygen via n/c  - Cont supportive care with nebs/MDI  - prednisone decreased to 10 mg on 6/30.  - He will continue on 10 mg of prednisone daily and follow up with [facility] outpatient.  Anasarca /Ascites /pulmonary edema  - please see above under Acute on chronic CKD  - Very difficult situation  - In afib, with low albumin  - requested assistance from both the CHF and Nephrology services. (6/27).  - By the time of discharge ascities was much improved due to diuresis and Ace Wrapping of the extremities  Rash on buttocks, Scrotum, lower legs, groin and feet.  -Spoke with Dr. [name] (patient's Dermatologist) on the telephone. She feels this is a disseminated fungal infection as he has had extensive fungal infections in the past and is significantly immunocompromised.  -He was started on oral diflucan and will continue on it for 5 days after discharge. -triamcinolone / silvadene cream TID to affected areas.  -Completed 7 days of valtrex for possible shingles on buttocks.   Dysarthia (6/25)  -Resolved. Likely the result of gabapentin initiated 6/24. D/c'd 6/25.  -Neuro consulted. CT head negative for stroke.  Leukocytosis  - WBC trending down.  - Uncertain etiology. Multifactorial from possible pneumonia, on steroids  - Monitor   Hypertension  -BP med dosing has been adjusted during this hospitalization - more for rate control than pressure control.   -BP stable on cardizem, high dose lasix, and metoprolol  Diabetes  - Lantus decreased to 8 units, continue with SSI-s  - Metformin discontinued due to declining renal function.  CORONARY ARTERY DISEASE (nonobstructive disease 2006 catheterization)/Elevation of cardiac enzymes  -no CP and likely due to acute HF, demand ischemia from recent hypoxia and worsened renal function  -Asa 81 mg daily.  Cellulitis of  left ankle  -has decreased rednessboth legs c/w stasis dermatitis- resolved   NSCLC s/p RULectomy and adjuvant chemo in 2006  - Path from 6/27 bronch biopsy is negative for malignancy.  - Does have a new lung nodule-will need close followup as an outpatient  - Oncology has seen this admission, and will monitor lung nodule outpatient.   Procedures:  Bronchoscopy X 2 on 6/17 and 6/27  Fiber Optic Laryngoscopy 6/26  Consultations: Cardiology  Pulmonology  Infectious disease.  Ear Nose and throat  Oncology  Dermatology (phone call) Nephrology  Advanced Heart Failure Team   Discharge Exam: Filed Vitals:   09/16/12 0008 09/16/12 0647 09/16/12 0722 09/16/12 0846  BP:  107/68  104/62  Pulse:  70  70  Temp:  98.5 F (36.9 C)  98.5 F (36.9 C)  TempSrc:  Oral  Oral  Resp: 19 16    Height:      Weight:   87.091 kg (192 lb)   SpO2:  92%  100%    General:  Awake, alert, breathing comfortably, very pleasant Chest:  Minimally coarse breath sounds bilaterally, no accessory muscle use CV:  Rrr, 2/6 systolic murmur, lower extremity edema is much improved. Abdomen:  Obese, soft, nt, nd, +BS Extremities:  Able to move all 5 with 5/5 strength,  Upper arms still with anasarca now wrapped in ace bandages, lower extremity edema virtually gone. Skin:  Rash on Groin, buttocks, scrotum and lower extremities much improved.  Receeding - no longer erythematous rather, tan in color.  Discharge Instructions      Discharge Orders   Future Appointments Provider Department Dept Phone   10/01/2012 4:15 PM Julio Sicks, NP [facility] [phone]   10/16/2012 1:30 PM Leslye Peer, MD [facility] [phone]   02/25/2013 1:30 PM Corwin Levins, MD [facility] [phone]   Future Orders Complete By Expires     Diet - low sodium heart healthy  As directed     Increase activity slowly  As directed         Medication List    STOP taking these  medications       allopurinol 100 MG tablet  Commonly known as:  ZYLOPRIM     budesonide-formoterol 160-4.5 MCG/ACT inhaler  Commonly known as:  SYMBICORT     colchicine 0.6 MG tablet     diltiazem 240 MG 24 hr capsule  Commonly known as:  TIAZAC     doxycycline 100 MG tablet  Commonly known as:  VIBRA-TABS     Fluticasone-Salmeterol 500-50 MCG/DOSE Aepb  Commonly known as:  ADVAIR     hydrALAZINE 50 MG tablet  Commonly known as:  APRESOLINE     HYDROcodone-acetaminophen 7.5-325 MG per tablet  Commonly known as:  NORCO     metFORMIN 500 MG tablet  Commonly  known as:  GLUCOPHAGE      TAKE these medications       albuterol 108 (90 BASE) MCG/ACT inhaler  Commonly known as:  PROVENTIL HFA;VENTOLIN HFA  Inhale 2 puffs into the lungs every 6 (six) hours as needed for wheezing.     ALPRAZolam 0.5 MG tablet  Commonly known as:  XANAX  Take 1 tablet (0.5 mg total) by mouth 3 (three) times daily as needed. For anxiety/sleeplessness.     amiodarone 200 MG tablet  Commonly known as:  PACERONE  Take 1 tablet (200 mg total) by mouth 2 (two) times daily.     arformoterol 15 MCG/2ML Nebu  Commonly known as:  BROVANA  Take 2 mLs (15 mcg total) by nebulization 2 (two) times daily.     aspirin 81 MG tablet  Take 81 mg by mouth daily.     atorvastatin 40 MG tablet  Commonly known as:  LIPITOR  Take 40 mg by mouth at bedtime.     budesonide 0.25 MG/2ML nebulizer solution  Commonly known as:  PULMICORT  Take 4 mLs (0.5 mg total) by nebulization 2 (two) times daily.     citalopram 10 MG tablet  Commonly known as:  CELEXA  Take 1 tablet (10 mg total) by mouth daily.     diltiazem 180 MG 24 hr capsule  Commonly known as:  CARDIZEM CD  Take 1 capsule (180 mg total) by mouth 2 (two) times daily.     Eye Vitamins Tabs  Take 1 tablet by mouth 2 (two) times daily.     ezetimibe 10 MG tablet  Commonly known as:  ZETIA  Take 10 mg by mouth daily.     feeding supplement Liqd   Take 237 mLs by mouth daily.     fish oil-omega-3 fatty acids 1000 MG capsule  Take 2 g by mouth daily.     fluconazole 100 MG tablet  Commonly known as:  DIFLUCAN  Take 1 tablet (100 mg total) by mouth daily.     furosemide 80 MG tablet  Commonly known as:  LASIX  Take 2 tablets (160 mg total) by mouth every 8 (eight) hours.     guaiFENesin 600 MG 12 hr tablet  Commonly known as:  MUCINEX  Take 2 tablets (1,200 mg total) by mouth 2 (two) times daily.     hydroxypropyl methylcellulose 2.5 % ophthalmic solution  Commonly known as:  ISOPTO TEARS  Place 2 drops into both eyes as needed. For dry/irritated eyes.     insulin aspart 100 UNIT/ML injection  Commonly known as:  novoLOG  Inject 0-9 Units into the skin 3 (three) times daily with meals.     insulin glargine 100 UNIT/ML injection  Commonly known as:  LANTUS  Inject 0.08 mLs (8 Units total) into the skin at bedtime.     Insulin Syringe-Needle U-100 31G X 5/16" 0.3 ML Misc  Commonly known as:  B-D INSULIN SYRINGE  1 Syringe by Does not apply route 4 (four) times daily.     levalbuterol 0.63 MG/3ML nebulizer solution  Commonly known as:  XOPENEX  Take 3 mLs (0.63 mg total) by nebulization every 3 (three) hours as needed for wheezing or shortness of breath.     levalbuterol 0.63 MG/3ML nebulizer solution  Commonly known as:  XOPENEX  Take 3 mLs (0.63 mg total) by nebulization every 4 (four) hours as needed for wheezing.     magnesium oxide 400 (241.3 MG) MG tablet  Commonly  known as:  DIASENSE MAGNESIUM  Take 1 tablet (400 mg total) by mouth daily.     metoprolol succinate 25 MG 24 hr tablet  Commonly known as:  TOPROL-XL  Take 1 tablet (25 mg total) by mouth 2 (two) times daily with a meal.     multivitamin per tablet  Take 1 tablet by mouth daily.     nitroGLYCERIN 0.4 MG SL tablet  Commonly known as:  NITROSTAT  Place 1 tablet (0.4 mg total) under the tongue every 5 (five) minutes as needed for chest pain.      omeprazole 20 MG capsule  Commonly known as:  PRILOSEC  Take 1 tablet by mouth once daily. PHARMACY-PLEASE D/C NEXIUM RX...TOO EXPENSIVE FOR PATIENT.     oxyCODONE 5 MG immediate release tablet  Commonly known as:  Oxy IR/ROXICODONE  Take 1 tablet (5 mg total) by mouth every 4 (four) hours as needed.     potassium chloride SA 20 MEQ tablet  Commonly known as:  K-DUR,KLOR-CON  Take 1 tablet (20 mEq total) by mouth daily.     predniSONE 5 MG tablet  Commonly known as:  DELTASONE  Take 2 tablets (10 mg total) by mouth daily.     senna-docusate 8.6-50 MG per tablet  Commonly known as:  Senokot-S  Take 3 tablets by mouth at bedtime.     temazepam 30 MG capsule  Commonly known as:  RESTORIL  Take 1 capsule (30 mg total) by mouth at bedtime.     tiotropium 18 MCG inhalation capsule  Commonly known as:  SPIRIVA  Place 1 capsule (18 mcg total) into inhaler and inhale daily.     vitamin B-12 100 MCG tablet  Commonly known as:  CYANOCOBALAMIN  Take 50 mcg by mouth daily.     vitamin E 600 UNIT capsule  Take 600 Units by mouth 2 (two) times daily.       No Known Allergies Follow-up Information   Follow up with MATTINGLY,MICHAEL T, MD. Schedule an appointment as soon as possible for a visit in 2 weeks.   Contact information:   [facility] [phone]       Follow up with Sherryl Manges, MD In 3 weeks.   Contact information:   [address] [place] [address] [phone]       Follow up with Leslye Peer., MD On 10/16/2012. (Appt at 1:30)    Contact information:   [address] [place] [address] [phone]       Follow up with PARRETT,TAMMY, NP On 10/01/2012. (Appt at 4:15)    Contact information:   [address] [place] [address] [phone]       Follow up with [name], MD In 4 weeks. (follow up for diabetes management and general health)    Contact information:   [address] [place] [address] [phone]       Follow up with Drue Second, MD. Schedule an appointment as soon as possible for a visit in 2 months.   Contact information:   [address] [place] [address] [phone]       Follow up with [facility]. (Home health physical therapy, nurse, and nurse's aide.)    Contact information:   [phone]       The results of significant diagnostics from this hospitalization (including imaging, microbiology, ancillary and laboratory) are listed below for reference.    Significant Diagnostic  Studies: Dg Chest 2 View  09/14/2012   *RADIOLOGY REPORT*  Clinical Data: Shortness of breath.  Evaluate infiltrates.  History of right upper lobectomy for non-small cell lung cancer.  CHEST - 2 VIEW  Comparison: Chest radiograph 09/11/2012 and 09/01/2012 and chest CT 09/08/2012  Findings: Left chest Port-A-Cath terminates in the superior vena cava.  Right-sided dual lead pacemaker is stable.  Stable cardiomegaly.  There are surgical clips in the right hilar region.  There are small bilateral pleural effusions.  Left basilar opacity is favored to be due to atelectasis.  Aeration of the perihilar regions of both lungs and the right lung base has improved compared to the recent chest radiograph of 09/11/2012.  IMPRESSION: 1.  Overall, improving aeration of the lungs since 09/11/2012. 2.  Persistent small bilateral pleural effusions and bibasilar opacities.   Original Report Authenticated By: Britta Mccreedy, [name]   Dg Chest 2 View  09/01/2012   *RADIOLOGY REPORT*  Clinical Data: Shortness of breath.  CHEST - 2 VIEW  Comparison: 08/31/2012.  Findings: The left subclavian Port-A-Cath tip it is most likely in the mid SVC at the level of the carina.  Stable pacer wires.  The heart is enlarged but unchanged.  Persistent bilateral infiltrates and effusions.  IMPRESSION:  1.  The Port-A-Cath tip appears to be in the mid SVC. 2.  Persistent bilateral  infiltrates and small effusions.   Original Report Authenticated By: Rudie Meyer, [name]   Ct Head Wo Contrast  09/12/2012   *RADIOLOGY REPORT*  Clinical Data: Evolving cerebral infarct. New onset atrial fibrillation and TIA symptoms.  CT HEAD WITHOUT CONTRAST  Technique:  Contiguous axial images were obtained from the base of the skull through the vertex without contrast.  Comparison: CT head without contrast 09/09/2012  Findings: Mild generalized atrophy and white matter disease is present.  The ventricles are proportionate to the degree of atrophy.  No acute cortical infarct, hemorrhage, or mass lesion is present.  The paranasal sinuses and mastoid air cells are clear.  The osseous skull is intact.  IMPRESSION:  1.  No acute intracranial abnormality or significant interval change. 2.  Mild atrophy white matter disease is not significantly changed from the most recent examination.   Original Report Authenticated By: Marin Roberts, [name]   Ct Head Wo Contrast  09/09/2012   *RADIOLOGY REPORT*  Clinical Data: Stroke.  The patient developed right-sided numbness and slurred speech last evening.  Remote history of lung cancer.  CT HEAD WITHOUT CONTRAST  Technique:  Contiguous axial images were obtained from the base of the skull through the vertex without contrast.  Comparison: CT head without and with contrast 10/01/2004.  Findings: Mild generalized atrophy and white matter disease is present. There is slight progression white matter disease since the prior exam.  No acute cortical infarct, hemorrhage, or mass lesion is present.  Ventricles are proportionate to the degree of atrophy. No significant extra-axial fluid collection is present.  The paranasal sinuses and mastoid air cells are clear.  The osseous skull is intact.  IMPRESSION:  1.  Slight progression of atrophy and white matter disease, likely reflecting sequelae of chronic microvascular ischemia. 2.  No acute intracranial abnormality.   Original  Report Authenticated By: Marin Roberts, [name]   Ct Chest Wo Contrast  09/08/2012   *RADIOLOGY REPORT*  Clinical Data: Hemoptysis.  Prior right upper lobectomy for non- small cell lung cancer with chemotherapy 2006.  CT CHEST WITHOUT CONTRAST  Technique:  Multidetector CT imaging of the  chest was performed following the standard protocol without IV contrast.  Comparison: Several prior CT scans most recent 08/30/2012 and 02/05/2012  Findings: There is been minimal improvement in the pulmonary parenchymal changes involving both lungs asymmetric greater on the right.  What remains may represent result of infection/aspiration. Interstitial spread of tumor not entirely excluded although a secondary less likely consideration.  Follow-up until complete clearance is recommended given the patient's history.  5 mm left upper lobe nodule  new compared to 2013 examination. Tumor not excluded.  Top normal sized low pretracheal lymph node with short axis dimension of 9.3 mm.  Cardiomegaly with sequential pacemaker in place.  Prominent coronary artery calcifications.  No pericardial effusion.  Atherosclerotic type changes of the aorta with ectasia similar to the prior exam.  Visualized upper abdominal structures without acute abnormality.  No bony destructive lesion.  Degenerative changes thoracic spine.  IMPRESSION: There is been minimal improvement in the pulmonary parenchymal changes involving both lungs asymmetric greater on the right.  What remains may represent result of infection/aspiration.  Interstitial spread of tumor not entirely excluded although a secondary less likely consideration.  Follow-up until complete clearance is recommended given the patient's history.  5 mm left upper lobe nodule  new compared to 2013 examination. Tumor not excluded.  Please see above.   Original Report Authenticated By: Lacy Duverney, [name]   Ct Chest Wo Contrast  08/30/2012   *RADIOLOGY REPORT*  Clinical Data: Hemoptysis.   Pneumonia versus edema.  CT CHEST WITHOUT CONTRAST  Technique:  Multidetector CT imaging of the chest was performed following the standard protocol without IV contrast.  Comparison: Chest radiograph 08/30/2012 and chest CT 02/05/2012  Findings: Heart is enlarged.  Cardiomegaly appears stable. Stable prominent fat density, consistent with lipoma, between the left and right atria.  Dual lead pacer is present.  There is heavy coronary artery atherosclerotic calcification of all three coronary arteries.  Aortic valvular calcifications are present.  A left IJ central venous catheter terminates in the superior vena cava. Thyroid gland and thoracic inlet appear within normal limits.  Heavy atherosclerotic calcification of the thoracic aorta, without aneurysm.  Several mediastinal lymph nodes are visualized, and within normal limits for size.  No pathologically enlarged lymph nodes are seen in the thorax.  Evaluation for the hilar lymph nodes is somewhat limited without intravenous contrast.  Small bilateral pleural effusions are present.  Multilevel degenerative changes of the thoracic spine.  Multiple benign hemangiomas in the thoracic spine vertebral bodies.  No acute or suspicious osseous abnormality.  No acute findings are identified in the abdomen.  Surgical clips are seen in the left upper quadrant.  There are postsurgical changes of the right lower lobectomy.  There is a background of centrilobular emphysema.  Stable scarring in the right upper lobe anteriorly.  No definite intralobular septal thickening.  There is markedly asymmetric airspace disease in the inferior and posterior aspect of the right upper lobe, where there are areas of consolidation.  There are ground-glass opacities which are new compared to prior chest CT in the left upper lobe, left lower lobe, and right middle lobe.  5 mm pulmonary nodule in the left upper lobe peripherally on image #27 of the lung windows is new.  IMPRESSION:  1.  Markedly  asymmetric bilateral airspace disease.  Airspace disease is most prominent, with areas of consolidation, in the posterior and inferior right upper lobe ( the patient has a history of right lower lobectomy).  Findings in the right  upper lobe are favored to be due to pneumonia or aspiration. 2.  New ground-glass opacities in the left upper lobe and left lower lobe may be due to early infection.  Underlying mild pulmonary edema cannot be excluded in this patient with cardiomegaly and small bilateral pleural effusions. 3.  New 5 mm pulmonary nodule left upper lobe. If the patient is at high risk for bronchogenic carcinoma, follow-up chest CT at 6-12 months is recommended.  If the patient is at low risk for bronchogenic carcinoma, follow-up chest CT at 12 months is recommended.  This recommendation follows the consensus statement: Guidelines for Management of Small Pulmonary Nodules Detected on CT Scans: A Statement from the Fleischner Society as published in Radiology 2005; 237:395-400. 4.  Cardiomegaly with three-vessel coronary artery disease and dual lead cardiac pacer. 5.  Small bilateral pleural effusions 6.  Aortic valve calcifications. This finding raises the possibility of aortic valvular stenosis.  .   Original Report Authenticated By: Britta Mccreedy, [name]   Dg Chest Port 1 View  08/31/2012   *RADIOLOGY REPORT*  Clinical Data: Follow-up edema  PORTABLE CHEST - 1 VIEW  Comparison: Yesterday  Findings: The tip of the left subclavian Port-A-Cath has changed its orientation.  Based on a single view, it is likely in the azygos vein.  Right subclavian dual lead pacemaker device and leads are stable and intact.  Bilateral pleural effusions left greater than right are stable.  Diffuse edema is stable. Cardiomegaly.  IMPRESSION: Left subclavian Port-A-Cath tip has changed its position and is likely in the azygos vein.  This can be confirmed with a lateral view.  Stable airspace disease and pleural effusions.   Original  Report Authenticated By: Jolaine Click, [name]   Dg Chest Port 1 View  08/30/2012   *RADIOLOGY REPORT*  Clinical Data: Respiratory failure.  PORTABLE CHEST - 1 VIEW  Comparison: 08/29/2012 and 08/27/2012  Findings: Prominent interstitial markings could represent underlying edema.  There are persistent interstitial and airspace densities in the right mid and lower lung region.  Heart size remains enlarged.  Again noted is a right dual lead cardiac pacemaker.  Left subclavian Port-A-Cath in the SVC region.  Aortic arch is heavily calcified.  IMPRESSION:  Prominent interstitial markings may represent underlying edema. Again noted are increased densities in the right mid and lower lung region which could represent asymmetric edema but cannot exclude an infectious etiology.   Original Report Authenticated By: Richarda Overlie, [name]   Dg Chest Port 1 View  08/29/2012   *RADIOLOGY REPORT*  Clinical Data: Follow-up CHF  PORTABLE CHEST - 1 VIEW  Comparison: Prior chest x-ray 08/27/2012  Findings: Stable position of left subclavian central venous catheter with the tip in the mid superior vena cava.  Right subclavian approach cardiac rhythm maintenance device with leads in the right atrium and right ventricle.  Slightly improved inspiratory volumes with clearing in the right base.  Stable elevation of the left hemidiaphragm resulting in blunting of the left costophrenic angle.  Decreasing interstitial edema with the residual asymmetric patchy opacity in the right mid and lower lobe. Small residual right pleural effusion.  Stable cardiomegaly. Aortic atherosclerosis again noted.  No pneumothorax.  IMPRESSION:  1.  Decreasing pulmonary edema with improved inspiratory volumes and clearing in the right base. 2.  Residual asymmetric patchy opacity in the right mid and lower lung may reflect residual asymmetric alveolar edema, or pneumonia. 3.  Persistent small right pleural effusion, chronic elevation of the left hemidiaphragm and  cardiomegaly.  Original Report Authenticated By: Malachy Moan, [name]   Dg Chest Portable 1 View  08/27/2012   *RADIOLOGY REPORT*  Clinical Data: Shortness of breath.  History of lung cancer post lobectomy.  PORTABLE CHEST - 1 VIEW  Comparison: 02/02/2012  Findings: Shallow inspiration.  Cardiac enlargement with pulmonary vascular congestion and perihilar edema.  Bilateral pleural effusions.  Atelectasis in the lung bases.  No pneumothorax. Changes have developed since the previous study.  Postoperative changes in the right chest.  Stable appearance of cardiac pacemaker and left central venous catheter.  Calcified and tortuous aorta.  IMPRESSION: Interval development of cardiac enlargement, pulmonary vascular congestion, perihilar edema, and bilateral pleural effusions.   Original Report Authenticated By: Burman Nieves, [name]   Dg Chest Port 1v Same Day  09/11/2012   *RADIOLOGY REPORT*  Clinical Data: 79 year old male shortness of breath.  Lung cancer. Hemoptysis.  PORTABLE CHEST - 1 VIEW SAME DAY  Comparison: Chest CT 09/08/2012 and earlier.  Findings: AP portable semi upright view at 0936 hours.  Stable right chest cardiac pacemaker and left chest Port-A-Cath which is currently accessed.  Kyphotic view.  Stable cardiac size and mediastinal contours. Bilateral pleural blunting and is stable, without evidence of significant effusion on the recent CT. Bibasilar increased opacity has not significantly changed.  No pneumothorax.  Patchy right perihilar opacity is mildly less confluent than on 09/01/2012.  IMPRESSION: Little change since 09/01/2012.  Bibasilar opacity with pleural blunting (but no effusion on the recent CT) and patchy right perihilar opacity.   Original Report Authenticated By: Erskine Speed, [name]    Microbiology: Recent Results (from the past 240 hour(s))  MRSA PCR SCREENING     Status: Abnormal   Collection Time    09/11/12  2:06 PM      Result Value Range Status   MRSA by PCR  POSITIVE (*) NEGATIVE Final   Comment:            The GeneXpert MRSA Assay (FDA     approved for NASAL specimens     only), is one component of a     comprehensive MRSA colonization     surveillance program. It is not     intended to diagnose MRSA     infection nor to guide or     monitor treatment for     MRSA infections.     RESULT CALLED TO, READ BACK BY AND VERIFIED WITH:     MILLER RN 15:40 09/11/12 ([email or web address])  CULTURE, BLOOD (ROUTINE X 2)     Status: None   Collection Time    09/13/12  2:11 PM      Result Value Range Status   Specimen Description BLOOD RIGHT ARM   Final   Special Requests BOTTLES DRAWN AEROBIC AND ANAEROBIC 10CC   Final   Culture  Setup Time 09/13/2012 19:02   Final   Culture     Final   Value:        BLOOD CULTURE RECEIVED NO GROWTH TO DATE CULTURE WILL BE HELD FOR 5 DAYS BEFORE ISSUING A FINAL NEGATIVE REPORT   Report Status PENDING   Incomplete  CULTURE, BLOOD (ROUTINE X 2)     Status: None   Collection Time    09/13/12  2:11 PM      Result Value Range Status   Specimen Description BLOOD RIGHT HAND   Final   Special Requests BOTTLES DRAWN AEROBIC AND ANAEROBIC 10CC   Final   Culture  Setup Time 09/13/2012  19:02   Final   Culture     Final   Value:        BLOOD CULTURE RECEIVED NO GROWTH TO DATE CULTURE WILL BE HELD FOR 5 DAYS BEFORE ISSUING A FINAL NEGATIVE REPORT   Report Status PENDING   Incomplete  CULTURE, EXPECTORATED SPUTUM-ASSESSMENT     Status: None   Collection Time    09/14/12  5:24 PM      Result Value Range Status   Specimen Description SPUTUM   Final   Special Requests Normal   Final   Sputum evaluation     Final   Value: THIS SPECIMEN IS ACCEPTABLE. RESPIRATORY CULTURE REPORT TO FOLLOW.   Report Status 09/14/2012 FINAL   Final  CULTURE, RESPIRATORY (NON-EXPECTORATED)     Status: None   Collection Time    09/14/12  5:24 PM      Result Value Range Status   Specimen Description SPUTUM   Final   Special Requests NONE   Final   Gram  Stain     Final   Value: NO WBC SEEN     NO SQUAMOUS EPITHELIAL CELLS SEEN     NO ORGANISMS SEEN   Culture NORMAL OROPHARYNGEAL FLORA   Final   Report Status PENDING   Incomplete     Labs: Basic Metabolic Panel:  Recent Labs Lab 09/10/12 0543 09/11/12 0500 09/13/12 0510 09/14/12 0500 09/15/12 0455  NA 136 138 136 133* 133*  K 3.5 4.0 3.3* 2.9* 3.7  CL 99 99 95* 91* 91*  CO2 29 28 33* 33* 34*  GLUCOSE 133* 87 154* 138* 145*  BUN 63* 64* 55* 61* 66*  CREATININE 2.30* 2.45* 2.19* 2.46* 2.57*  CALCIUM 8.2* 8.3* 8.3* 8.6 8.6  PHOS  --   --  3.7 3.9 3.3   Liver Function Tests:  Recent Labs Lab 09/13/12 0510 09/14/12 0500 09/15/12 0455  ALBUMIN 2.5* 2.5* 2.6*   CBC:  Recent Labs Lab 09/12/12 0705 09/13/12 0510 09/14/12 0500 09/15/12 0455 09/16/12 0920  WBC 15.7* 18.1* 15.2* 15.7* 18.2*  HGB 10.7* 11.4* 11.4* 11.5* 11.4*  HCT 32.4* 33.9* 33.7* 34.4* 33.4*  MCV 93.4 92.4 91.6 91.7 92.0  PLT 318 335 325 303 266   BNP: BNP (last 3 results)  Recent Labs  08/31/12 0500 09/06/12 0545 09/11/12 0500  PROBNP 7566.0* 1793.0* 1709.0*   CBG:  Recent Labs Lab 09/15/12 1146 09/15/12 1636 09/15/12 2142 09/16/12 0704 09/16/12 1142  GLUCAP 166* 165* 129* 102* 142*       Signed:  Stephani Police, PA-C  [facility] 09/16/2012, 12:32 PM

## 2012-09-16 NOTE — Progress Notes (Signed)
Subjective: Interval History: has no complaints, doing better.  Objective: Vital signs in last 24 hours: Temp:  [98.4 F (36.9 C)-98.5 F (36.9 C)] 98.5 F (36.9 C) (07/02 0846) Pulse Rate:  [68-71] 70 (07/02 0846) Resp:  [16-20] 16 (07/02 0647) BP: (104-124)/(62-83) 104/62 mmHg (07/02 0846) SpO2:  [92 %-100 %] 100 % (07/02 0846) Weight:  [87.091 kg (192 lb)] 87.091 kg (192 lb) (07/02 0722) Weight change:   Intake/Output from previous day: 07/01 0701 - 07/02 0700 In: 1160 [P.O.:1160] Out: 2826 [Urine:2825; Stool:1] Intake/Output this shift: Total I/O In: 180 [P.O.:180] Out: 1850 [Urine:1850]  General appearance: alert, cooperative and plethoric Resp: diminished breath sounds bilaterally and rales bibasilar Cardio: S1, S2 normal and systolic murmur: holosystolic 2/6, blowing at apex GI: pos bs, obese, liver down 5 cm Extremities: edema 2+  Lab Results:  Recent Labs  09/14/12 0500 09/15/12 0455  WBC 15.2* 15.7*  HGB 11.4* 11.5*  HCT 33.7* 34.4*  PLT 325 303   BMET:  Recent Labs  09/14/12 0500 09/15/12 0455  NA 133* 133*  K 2.9* 3.7  CL 91* 91*  CO2 33* 34*  GLUCOSE 138* 145*  BUN 61* 66*  CREATININE 2.46* 2.57*  CALCIUM 8.6 8.6   No results found for this basename: PTH,  in the last 72 hours Iron Studies:  Recent Labs  09/14/12 1130  IRON 37*  TIBC 332    Studies/Results: Dg Chest 2 View  09/14/2012   *RADIOLOGY REPORT*  Clinical Data: Shortness of breath.  Evaluate infiltrates.  History of right upper lobectomy for non-small cell lung cancer.  CHEST - 2 VIEW  Comparison: Chest radiograph 09/11/2012 and 09/01/2012 and chest CT 09/08/2012  Findings: Left chest Port-A-Cath terminates in the superior vena cava.  Right-sided dual lead pacemaker is stable.  Stable cardiomegaly.  There are surgical clips in the right hilar region.  There are small bilateral pleural effusions.  Left basilar opacity is favored to be due to atelectasis.  Aeration of the  perihilar regions of both lungs and the right lung base has improved compared to the recent chest radiograph of 09/11/2012.  IMPRESSION: 1.  Overall, improving aeration of the lungs since 09/11/2012. 2.  Persistent small bilateral pleural effusions and bibasilar opacities.   Original Report Authenticated By: Britta Mccreedy, [name]    I have reviewed the patient's current medications.  Assessment/Plan: 1 CKD 4 Vol xs improving, would d/c on tid 160 mg Lasix.  Will need to f/u with Dr. Briant Cedar within 2 wk. 2 Anemia stabel 3 COPD 4 DM stable P will see as outpatient, s/o for now.    LOS: 20 days   [name] Stevenson L 09/16/2012,9:14 AM

## 2012-09-16 NOTE — Progress Notes (Signed)
Will review as outpt the role of anticoagulation and DCCV and make decsion at taht point regarding amiodarone

## 2012-09-16 NOTE — Progress Notes (Signed)
NUTRITION FOLLOW UP  Intervention:   Continue Ensure Complete po daily, each supplement provides 350 kcal and 13 grams of protein. RD will continue to follow.   New Nutrition Dx:   Increased nutrient needs r/t COPD and acute medical issues AEB estimated needs. Ongoing.  Goal:   PO intake to meet >/=90% estimated nutrition needs. Met  Monitor:   PO intake, weight trends, oral supplement acceptance  Assessment:   Continues to have small amount of hemoptysis, s/p bronch. Bronch showed lesion at old lobectomy, concerning for malignancy. Pt to d/c home today.  BSE completed 6/25 - SLP recommending Regular diet with thin liquids. Pt continues to eat 50-100% most meals. Weight is stable. Family is bringing in outside foods to help with oral intake.  Ensure Complete po daily.  Height: Ht Readings from Last 1 Encounters:  08/27/12 5\' 7"  (1.702 m)    Weight Status:   Wt Readings from Last 1 Encounters:  09/16/12 192 lb (87.091 kg)  Admit wt 206 lb - wt trending down 2/2 lasix use.  Re-estimated needs:  Kcal: 2050-2350  Protein: 93-112g  Fluid: ~2.0 L/day   Skin: skin tear L arm  Diet Order: Cardiac   Intake/Output Summary (Last 24 hours) at 09/16/12 1213 Last data filed at 09/16/12 4540  Gross per 24 hour  Intake    860 ml  Output   2626 ml  Net  -1766 ml    Last BM: 7/1   Labs:   Recent Labs Lab 09/13/12 0510 09/14/12 0500 09/15/12 0455  NA 136 133* 133*  K 3.3* 2.9* 3.7  CL 95* 91* 91*  CO2 33* 33* 34*  BUN 55* 61* 66*  CREATININE 2.19* 2.46* 2.57*  CALCIUM 8.3* 8.6 8.6  PHOS 3.7 3.9 3.3  GLUCOSE 154* 138* 145*    CBG (last 3)   Recent Labs  09/15/12 2142 09/16/12 0704 09/16/12 1142  GLUCAP 129* 102* 142*    Scheduled Meds: . amiodarone  200 mg Oral BID  . arformoterol  15 mcg Nebulization BID  . aspirin EC  81 mg Oral Daily  . atorvastatin  40 mg Oral QHS  . barrier cream  1 application Topical BID  . bd getting started take home kit  1  kit Other Once  . budesonide  0.5 mg Nebulization BID  . citalopram  10 mg Oral Daily  . diltiazem  180 mg Oral BID  . diphenhydrAMINE  25 mg Oral Q6H  . docusate sodium  200 mg Oral BID  . ezetimibe  10 mg Oral Daily  . feeding supplement  237 mL Oral Q24H  . fluconazole  100 mg Oral Daily  . furosemide  160 mg Oral Q8H  . guaiFENesin  1,200 mg Oral BID  . insulin aspart  0-9 Units Subcutaneous TID WC  . insulin glargine  8 Units Subcutaneous QHS  . lidocaine  15 mL Mouth/Throat Once  . magnesium oxide  400 mg Oral Daily  . metoprolol succinate  25 mg Oral BID WC  . multivitamin  1 tablet Oral Daily  . pantoprazole  40 mg Oral Daily  . polyethylene glycol  17 g Oral BID  . predniSONE  10 mg Oral QPC breakfast  . senna-docusate  3 tablet Oral QHS  . sodium chloride  3 mL Intravenous Q12H  . terbinafine  250 mg Oral Daily  . tiotropium  18 mcg Inhalation Daily  . triamcinolone cream (KENALOG), silver sulfADIAZINE (SILVADENE) 1 %   Apply externally  TID    Jarold Motto MS, RD, LDN Pager: [phone] After-hours pager: [phone]

## 2012-09-16 NOTE — Progress Notes (Signed)
09/16/12 1145 Spoke with pt. and spouse about home health needs.  Pt. will dc home today with spouse.  Spouse states she would like HH to visit tomorrow.  TC to Hilda Lias, with [facility], and there will be a home visit for tomorrow scheduled.  In addition, noted DME referral for nebulizer.  Ordered this thru [facility].  Pt. has home oxygen thru [facility] as well.  Pt. will have his son to assist pt. as well. Tera Mater, RN, BSN NCM [phone]

## 2012-09-16 NOTE — Progress Notes (Signed)
Patient: [name] LEINO Sr. Date of Encounter: 09/16/2012, 10:46 AM Admit date: 08/27/2012     Subjective  Mr. Menees resting comfortably this AM. His wife is at bedside. He denies CP, SOB or palpitations.   Objective  Physical Exam: Vitals: BP 104/62  Pulse 70  Temp(Src) 98.5 F (36.9 C) (Oral)  Resp 16  Ht 5\' 7"  (1.702 m)  Wt 192 lb (87.091 kg)  BMI 30.06 kg/m2  SpO2 100% General: Well developed, chronically ill appearing 79 year old male in no acute distress. Neck: Supple. No JVD. Lungs: Diminished breath sounds throughout but clear bilaterally to auscultation without wheezes, rales, or rhonchi. Breathing is unlabored. Heart: S1 S2 present without murmur, rub or gallop.  Abdomen: Soft, non-distended. Extremities: No clubbing or cyanosis. No edema. Dressings in place bilaterally.  Neuro: Alert and oriented X 3. Moves all extremities spontaneously. No focal deficits.  Intake/Output:  Intake/Output Summary (Last 24 hours) at 09/16/12 1046 Last data filed at 09/16/12 1610  Gross per 24 hour  Intake    860 ml  Output   2901 ml  Net  -2041 ml    Inpatient Medications:  . amiodarone  200 mg Oral BID  . arformoterol  15 mcg Nebulization BID  . aspirin EC  81 mg Oral Daily  . atorvastatin  40 mg Oral QHS  . barrier cream  1 application Topical BID  . budesonide  0.5 mg Nebulization BID  . citalopram  10 mg Oral Daily  . diltiazem  180 mg Oral BID  . diphenhydrAMINE  25 mg Oral Q6H  . docusate sodium  200 mg Oral BID  . ezetimibe  10 mg Oral Daily  . feeding supplement  237 mL Oral Q24H  . fluconazole  100 mg Oral Daily  . furosemide  160 mg Oral Q8H  . guaiFENesin  1,200 mg Oral BID  . insulin aspart  0-9 Units Subcutaneous TID WC  . insulin glargine  8 Units Subcutaneous QHS  . lidocaine  15 mL Mouth/Throat Once  . magnesium oxide  400 mg Oral Daily  . metoprolol succinate  25 mg Oral BID WC  . multivitamin  1 tablet Oral Daily  . pantoprazole  40 mg Oral Daily   . polyethylene glycol  17 g Oral BID  . predniSONE  10 mg Oral QPC breakfast  . senna-docusate  3 tablet Oral QHS  . sodium chloride  3 mL Intravenous Q12H  . terbinafine  250 mg Oral Daily  . tiotropium  18 mcg Inhalation Daily  . triamcinolone cream (KENALOG), silver sulfADIAZINE (SILVADENE) 1 %   Apply externally TID    Labs:  Recent Labs  09/14/12 0500 09/15/12 0455  NA 133* 133*  K 2.9* 3.7  CL 91* 91*  CO2 33* 34*  GLUCOSE 138* 145*  BUN 61* 66*  CREATININE 2.46* 2.57*  CALCIUM 8.6 8.6  PHOS 3.9 3.3    Recent Labs  09/14/12 0500 09/15/12 0455  ALBUMIN 2.5* 2.6*    Recent Labs  09/15/12 0455 09/16/12 0920  WBC 15.7* 18.2*  HGB 11.5* 11.4*  HCT 34.4* 33.4*  MCV 91.7 92.0  PLT 303 266    Recent Labs  09/14/12 1130  TIBC 332  IRON 37*    Radiology/Studies: Dg Chest 2 View  09/14/2012   *RADIOLOGY REPORT*  Clinical Data: Shortness of breath.  Evaluate infiltrates.  History of right upper lobectomy for non-small cell lung cancer.  CHEST - 2 VIEW  Comparison:  Chest radiograph 09/11/2012 and 09/01/2012 and chest CT 09/08/2012  Findings: Left chest Port-A-Cath terminates in the superior vena cava.  Right-sided dual lead pacemaker is stable.  Stable cardiomegaly.  There are surgical clips in the right hilar region.  There are small bilateral pleural effusions.  Left basilar opacity is favored to be due to atelectasis.  Aeration of the perihilar regions of both lungs and the right lung base has improved compared to the recent chest radiograph of 09/11/2012.  IMPRESSION: 1.  Overall, improving aeration of the lungs since 09/11/2012. 2.  Persistent small bilateral pleural effusions and bibasilar opacities.   Original Report Authenticated By: Britta Mccreedy, [name]   Ct Head Wo Contrast  09/12/2012   *RADIOLOGY REPORT*  Clinical Data: Evolving cerebral infarct. New onset atrial fibrillation and TIA symptoms.  CT HEAD WITHOUT CONTRAST  Technique:  Contiguous axial images  were obtained from the base of the skull through the vertex without contrast.  Comparison: CT head without contrast 09/09/2012  Findings: Mild generalized atrophy and white matter disease is present.  The ventricles are proportionate to the degree of atrophy.  No acute cortical infarct, hemorrhage, or mass lesion is present.  The paranasal sinuses and mastoid air cells are clear.  The osseous skull is intact.  IMPRESSION:  1.  No acute intracranial abnormality or significant interval change. 2.  Mild atrophy white matter disease is not significantly changed from the most recent examination.   Original Report Authenticated By: Marin Roberts, [name]    Telemetry: V paced with underlying AFib    Assessment and Plan  1. Acute on chronic respiratory failure / severe COPD 2. Hemoptysis - bronchial washings negative on 06/27; will continue low dose ASA for now and reevaluate for anticoagulation in 2-3 weeks 3. Acute on chronic diastolic HF - volume management per Nephrology - per Dr. Deterding's note today would DC on Lasix 160 mg TID  4. Atrial fibrillation - continue amiodarone for now but will need to make decision about long term amio use if we are not going to cardiovert 5. PNA 6. CKD  Dr. Graciela Husbands to see Signed, Rick Duff PA-C

## 2012-09-16 NOTE — Discharge Summary (Signed)
Addendum  Patient seen and examined, chart and data base reviewed.  I agree with the above assessment and plan.  For full details please see Mrs. Algis Downs PA note.  Prolonged and rather complicated hospital stay.   Changes include Lasix dosage, no anticoagulation and addition of insulin.   Clint Lipps, MD [facility] Pager: [phone] 09/16/2012, 12:44 PM

## 2012-09-16 NOTE — Progress Notes (Signed)
ANTICOAGULATION CONSULT NOTE  Pharmacy Consult for Heparin Indication: atrial fibrillation  No Known Allergies  Patient Measurements: Height: 5\' 7"  (170.2 cm) Weight: 202 lb 13.2 oz (92 kg) (Bed Scale) IBW/kg (Calculated) : 66.1 Heparin Dosing Weight:  ~ 74 kg  Vital Signs: Temp: 98.4 F (36.9 C) (07/01 2205) Temp src: Oral (07/01 2205) BP: 124/83 mmHg (07/01 2205) Pulse Rate: 71 (07/01 2205)  Labs:  Recent Labs  09/13/12 0510 09/14/12 0500 09/15/12 0455 09/15/12 2311  HGB 11.4* 11.4* 11.5*  --   HCT 33.9* 33.7* 34.4*  --   PLT 335 325 303  --   HEPARINUNFRC  --   --   --  0.83*  CREATININE 2.19* 2.46* 2.57*  --    Estimated Creatinine Clearance: 26 ml/min (by C-G formula based on Cr of 2.57).  Medi Assessment: 79 yo male with new onset Afib for heparin  Goal of Therapy:  Heparin level 0.3-0.7 units/ml Monitor platelets by anticoagulation protocol: Yes   Plan:  Decrease heparin 1150 units/hr Follow-up am labs.  Geannie Risen, PharmD, BCPS  09/16/2012,12:04 AM

## 2012-09-17 ENCOUNTER — Telehealth: Payer: Self-pay | Admitting: Internal Medicine

## 2012-09-17 LAB — CULTURE, RESPIRATORY W GRAM STAIN
Culture: NORMAL
Gram Stain: NONE SEEN

## 2012-09-17 NOTE — Telephone Encounter (Signed)
noted 

## 2012-09-17 NOTE — Telephone Encounter (Signed)
Ok to cont the metformin, does not cause kidney damage

## 2012-09-17 NOTE — Telephone Encounter (Signed)
Samples ok this time

## 2012-09-17 NOTE — Telephone Encounter (Signed)
Informed the son of MD instructions.  Informed samples are ready for pickup at the front desk.

## 2012-09-17 NOTE — Telephone Encounter (Signed)
[facility] called back to inform the patient did not go to the ER.  [name] (the son) said he did not need to go.  HHRN stated he had a 4cm x 5cm gash on his hand and has a bruise on his right hip.  HHRN has put a non adherent bandage on his hand for now.  HHRN stated the patient is in the bed and not sure how they will get him up.  She said he should have gone to rehab from the hospital and not home.  Please advise on bandage instructions.

## 2012-09-17 NOTE — Telephone Encounter (Signed)
Pt needs needles, test strips, and insulin. He is on lantis and novolog.  They need help getting these because they can't afford it.  Do we have samples of the insulin they can have.  It will cost $700.00 to buy the insulin.  His BS is 147. Is the metforman bad for his kidneys. Ok to talk to the son.  His wife is really tired.

## 2012-09-17 NOTE — Telephone Encounter (Signed)
I would still consider ER for laceration stitches or at least the steristrips if skin is too thin

## 2012-09-17 NOTE — Telephone Encounter (Signed)
[name] from [facility] was at the patients home.  He fell this morning.  He has cuts on his arm and his hip is painful.  Per Zella Ball, Dr. Raphael Gibney CMA, advised Zella Ball to have him go to the ER.

## 2012-09-17 NOTE — Telephone Encounter (Signed)
Informed HHRN of MD instructions.

## 2012-09-18 ENCOUNTER — Telehealth: Payer: Self-pay | Admitting: Physician Assistant

## 2012-09-18 NOTE — Telephone Encounter (Signed)
Pt's wife called answering service on July 4th. He has a spot on his ankle that is extremely tender to the touch. She states she is sure it is cellulitis and requested I call in antibiotics. I asked her if she meant to call the cardiologist for this issue. She said she initially tried primary care but was told to call 911. I told her I do not feel comfortable calling in antibiotics and feel that he needs to be evaluated in person either in ED or urgent care. This could be gout, DVT, cellulitis, any number of things. She states she can barely lift him up and he cannot walk because of the pain. No other acute symptoms. I said in this case perhaps she should call 911 because she should not injure her own self trying to lift him. She verbalized understanding and gratitude. Dayna Dunn PA-C

## 2012-09-20 LAB — CULTURE, BLOOD (ROUTINE X 2)

## 2012-09-21 ENCOUNTER — Inpatient Hospital Stay (HOSPITAL_COMMUNITY)
Admission: EM | Admit: 2012-09-21 | Discharge: 2012-10-16 | DRG: 291 | Disposition: E | Payer: Medicare Other | Attending: Internal Medicine | Admitting: Internal Medicine

## 2012-09-21 ENCOUNTER — Encounter (HOSPITAL_COMMUNITY): Payer: Self-pay | Admitting: Emergency Medicine

## 2012-09-21 ENCOUNTER — Emergency Department (HOSPITAL_COMMUNITY): Payer: Medicare Other

## 2012-09-21 DIAGNOSIS — N058 Unspecified nephritic syndrome with other morphologic changes: Secondary | ICD-10-CM | POA: Diagnosis present

## 2012-09-21 DIAGNOSIS — K5900 Constipation, unspecified: Secondary | ICD-10-CM | POA: Diagnosis present

## 2012-09-21 DIAGNOSIS — Z66 Do not resuscitate: Secondary | ICD-10-CM | POA: Diagnosis present

## 2012-09-21 DIAGNOSIS — I129 Hypertensive chronic kidney disease with stage 1 through stage 4 chronic kidney disease, or unspecified chronic kidney disease: Secondary | ICD-10-CM | POA: Diagnosis present

## 2012-09-21 DIAGNOSIS — Z950 Presence of cardiac pacemaker: Secondary | ICD-10-CM

## 2012-09-21 DIAGNOSIS — J156 Pneumonia due to other aerobic Gram-negative bacteria: Secondary | ICD-10-CM

## 2012-09-21 DIAGNOSIS — N179 Acute kidney failure, unspecified: Secondary | ICD-10-CM | POA: Diagnosis not present

## 2012-09-21 DIAGNOSIS — Z9981 Dependence on supplemental oxygen: Secondary | ICD-10-CM

## 2012-09-21 DIAGNOSIS — I10 Essential (primary) hypertension: Secondary | ICD-10-CM | POA: Diagnosis present

## 2012-09-21 DIAGNOSIS — R221 Localized swelling, mass and lump, neck: Secondary | ICD-10-CM

## 2012-09-21 DIAGNOSIS — E1165 Type 2 diabetes mellitus with hyperglycemia: Secondary | ICD-10-CM | POA: Diagnosis present

## 2012-09-21 DIAGNOSIS — K219 Gastro-esophageal reflux disease without esophagitis: Secondary | ICD-10-CM | POA: Diagnosis present

## 2012-09-21 DIAGNOSIS — I495 Sick sinus syndrome: Secondary | ICD-10-CM | POA: Diagnosis present

## 2012-09-21 DIAGNOSIS — Z801 Family history of malignant neoplasm of trachea, bronchus and lung: Secondary | ICD-10-CM

## 2012-09-21 DIAGNOSIS — J9620 Acute and chronic respiratory failure, unspecified whether with hypoxia or hypercapnia: Secondary | ICD-10-CM | POA: Diagnosis not present

## 2012-09-21 DIAGNOSIS — I5033 Acute on chronic diastolic (congestive) heart failure: Principal | ICD-10-CM | POA: Diagnosis present

## 2012-09-21 DIAGNOSIS — W19XXXA Unspecified fall, initial encounter: Secondary | ICD-10-CM | POA: Diagnosis present

## 2012-09-21 DIAGNOSIS — R0902 Hypoxemia: Secondary | ICD-10-CM | POA: Diagnosis present

## 2012-09-21 DIAGNOSIS — E1129 Type 2 diabetes mellitus with other diabetic kidney complication: Secondary | ICD-10-CM

## 2012-09-21 DIAGNOSIS — K117 Disturbances of salivary secretion: Secondary | ICD-10-CM

## 2012-09-21 DIAGNOSIS — D638 Anemia in other chronic diseases classified elsewhere: Secondary | ICD-10-CM | POA: Diagnosis present

## 2012-09-21 DIAGNOSIS — R042 Hemoptysis: Secondary | ICD-10-CM

## 2012-09-21 DIAGNOSIS — R32 Unspecified urinary incontinence: Secondary | ICD-10-CM | POA: Diagnosis not present

## 2012-09-21 DIAGNOSIS — Z85118 Personal history of other malignant neoplasm of bronchus and lung: Secondary | ICD-10-CM

## 2012-09-21 DIAGNOSIS — I82629 Acute embolism and thrombosis of deep veins of unspecified upper extremity: Secondary | ICD-10-CM | POA: Diagnosis present

## 2012-09-21 DIAGNOSIS — K449 Diaphragmatic hernia without obstruction or gangrene: Secondary | ICD-10-CM | POA: Diagnosis present

## 2012-09-21 DIAGNOSIS — N183 Chronic kidney disease, stage 3 unspecified: Secondary | ICD-10-CM | POA: Diagnosis present

## 2012-09-21 DIAGNOSIS — R220 Localized swelling, mass and lump, head: Secondary | ICD-10-CM | POA: Diagnosis present

## 2012-09-21 DIAGNOSIS — F3289 Other specified depressive episodes: Secondary | ICD-10-CM | POA: Diagnosis present

## 2012-09-21 DIAGNOSIS — M109 Gout, unspecified: Secondary | ICD-10-CM | POA: Diagnosis present

## 2012-09-21 DIAGNOSIS — G4733 Obstructive sleep apnea (adult) (pediatric): Secondary | ICD-10-CM | POA: Diagnosis present

## 2012-09-21 DIAGNOSIS — J189 Pneumonia, unspecified organism: Secondary | ICD-10-CM | POA: Diagnosis present

## 2012-09-21 DIAGNOSIS — I4891 Unspecified atrial fibrillation: Secondary | ICD-10-CM | POA: Diagnosis present

## 2012-09-21 DIAGNOSIS — IMO0002 Reserved for concepts with insufficient information to code with codable children: Secondary | ICD-10-CM | POA: Diagnosis present

## 2012-09-21 DIAGNOSIS — E876 Hypokalemia: Secondary | ICD-10-CM | POA: Diagnosis not present

## 2012-09-21 DIAGNOSIS — F1020 Alcohol dependence, uncomplicated: Secondary | ICD-10-CM | POA: Diagnosis present

## 2012-09-21 DIAGNOSIS — E8809 Other disorders of plasma-protein metabolism, not elsewhere classified: Secondary | ICD-10-CM | POA: Diagnosis present

## 2012-09-21 DIAGNOSIS — R0600 Dyspnea, unspecified: Secondary | ICD-10-CM

## 2012-09-21 DIAGNOSIS — C3490 Malignant neoplasm of unspecified part of unspecified bronchus or lung: Secondary | ICD-10-CM | POA: Diagnosis present

## 2012-09-21 DIAGNOSIS — Z8674 Personal history of sudden cardiac arrest: Secondary | ICD-10-CM

## 2012-09-21 DIAGNOSIS — Z8249 Family history of ischemic heart disease and other diseases of the circulatory system: Secondary | ICD-10-CM

## 2012-09-21 DIAGNOSIS — H547 Unspecified visual loss: Secondary | ICD-10-CM | POA: Diagnosis present

## 2012-09-21 DIAGNOSIS — Z9221 Personal history of antineoplastic chemotherapy: Secondary | ICD-10-CM

## 2012-09-21 DIAGNOSIS — S61409A Unspecified open wound of unspecified hand, initial encounter: Secondary | ICD-10-CM | POA: Diagnosis present

## 2012-09-21 DIAGNOSIS — F17200 Nicotine dependence, unspecified, uncomplicated: Secondary | ICD-10-CM | POA: Diagnosis present

## 2012-09-21 DIAGNOSIS — I82621 Acute embolism and thrombosis of deep veins of right upper extremity: Secondary | ICD-10-CM

## 2012-09-21 DIAGNOSIS — I509 Heart failure, unspecified: Secondary | ICD-10-CM | POA: Diagnosis present

## 2012-09-21 DIAGNOSIS — I2510 Atherosclerotic heart disease of native coronary artery without angina pectoris: Secondary | ICD-10-CM | POA: Diagnosis present

## 2012-09-21 DIAGNOSIS — E860 Dehydration: Secondary | ICD-10-CM | POA: Diagnosis present

## 2012-09-21 DIAGNOSIS — I5032 Chronic diastolic (congestive) heart failure: Secondary | ICD-10-CM

## 2012-09-21 DIAGNOSIS — R1310 Dysphagia, unspecified: Secondary | ICD-10-CM | POA: Diagnosis present

## 2012-09-21 DIAGNOSIS — R627 Adult failure to thrive: Secondary | ICD-10-CM | POA: Diagnosis present

## 2012-09-21 DIAGNOSIS — R5381 Other malaise: Secondary | ICD-10-CM | POA: Diagnosis present

## 2012-09-21 DIAGNOSIS — J449 Chronic obstructive pulmonary disease, unspecified: Secondary | ICD-10-CM

## 2012-09-21 DIAGNOSIS — H3530 Unspecified macular degeneration: Secondary | ICD-10-CM | POA: Diagnosis present

## 2012-09-21 DIAGNOSIS — J441 Chronic obstructive pulmonary disease with (acute) exacerbation: Secondary | ICD-10-CM | POA: Diagnosis present

## 2012-09-21 DIAGNOSIS — R21 Rash and other nonspecific skin eruption: Secondary | ICD-10-CM | POA: Diagnosis present

## 2012-09-21 DIAGNOSIS — Y92009 Unspecified place in unspecified non-institutional (private) residence as the place of occurrence of the external cause: Secondary | ICD-10-CM

## 2012-09-21 DIAGNOSIS — E43 Unspecified severe protein-calorie malnutrition: Secondary | ICD-10-CM | POA: Diagnosis present

## 2012-09-21 DIAGNOSIS — F329 Major depressive disorder, single episode, unspecified: Secondary | ICD-10-CM | POA: Diagnosis present

## 2012-09-21 DIAGNOSIS — Z515 Encounter for palliative care: Secondary | ICD-10-CM

## 2012-09-21 DIAGNOSIS — E871 Hypo-osmolality and hyponatremia: Secondary | ICD-10-CM | POA: Diagnosis not present

## 2012-09-21 HISTORY — DX: Other disorders of plasma-protein metabolism, not elsewhere classified: E88.09

## 2012-09-21 HISTORY — DX: Hemoptysis: R04.2

## 2012-09-21 HISTORY — DX: Malignant neoplasm of unspecified part of unspecified bronchus or lung: C34.90

## 2012-09-21 HISTORY — DX: Unspecified mycosis: B49

## 2012-09-21 HISTORY — DX: Pneumonia, unspecified organism: J18.9

## 2012-09-21 LAB — CBC WITH DIFFERENTIAL/PLATELET
Basophils Absolute: 0 10*3/uL (ref 0.0–0.1)
Basophils Relative: 0 % (ref 0–1)
Eosinophils Absolute: 0 10*3/uL (ref 0.0–0.7)
MCH: 31.9 pg (ref 26.0–34.0)
MCHC: 33.7 g/dL (ref 30.0–36.0)
Neutro Abs: 15.6 10*3/uL — ABNORMAL HIGH (ref 1.7–7.7)
Neutrophils Relative %: 83 % — ABNORMAL HIGH (ref 43–77)
Platelets: 242 10*3/uL (ref 150–400)
RDW: 17.6 % — ABNORMAL HIGH (ref 11.5–15.5)

## 2012-09-21 LAB — POCT I-STAT 3, ART BLOOD GAS (G3+)
Acid-Base Excess: 7 mmol/L — ABNORMAL HIGH (ref 0.0–2.0)
Bicarbonate: 31.1 mEq/L — ABNORMAL HIGH (ref 20.0–24.0)
pCO2 arterial: 42.2 mmHg (ref 35.0–45.0)
pO2, Arterial: 113 mmHg — ABNORMAL HIGH (ref 80.0–100.0)

## 2012-09-21 LAB — MRSA PCR SCREENING: MRSA by PCR: POSITIVE — AB

## 2012-09-21 LAB — TROPONIN I
Troponin I: <0.3 ng/mL (ref <0.30)
Troponin I: <0.3 ng/mL (ref <0.30)

## 2012-09-21 LAB — COMPREHENSIVE METABOLIC PANEL
AST: 31 U/L (ref 0–37)
Albumin: 2.4 g/dL — ABNORMAL LOW (ref 3.5–5.2)
Alkaline Phosphatase: 110 U/L (ref 39–117)
BUN: 86 mg/dL — ABNORMAL HIGH (ref 6–23)
Chloride: 85 mEq/L — ABNORMAL LOW (ref 96–112)
Potassium: 3.1 mEq/L — ABNORMAL LOW (ref 3.5–5.1)
Total Bilirubin: 0.5 mg/dL (ref 0.3–1.2)
Total Protein: 5.9 g/dL — ABNORMAL LOW (ref 6.0–8.3)

## 2012-09-21 LAB — URINALYSIS, ROUTINE W REFLEX MICROSCOPIC
Hgb urine dipstick: NEGATIVE
Nitrite: NEGATIVE
Protein, ur: 100 mg/dL — AB
Urobilinogen, UA: 0.2 mg/dL (ref 0.0–1.0)

## 2012-09-21 LAB — URINE MICROSCOPIC-ADD ON

## 2012-09-21 LAB — GLUCOSE, CAPILLARY: Glucose-Capillary: 249 mg/dL — ABNORMAL HIGH (ref 70–99)

## 2012-09-21 LAB — CG4 I-STAT (LACTIC ACID): Lactic Acid, Venous: 1.91 mmol/L (ref 0.5–2.2)

## 2012-09-21 LAB — PRO B NATRIURETIC PEPTIDE: Pro B Natriuretic peptide (BNP): 806.5 pg/mL — ABNORMAL HIGH (ref 0–450)

## 2012-09-21 LAB — PROCALCITONIN: Procalcitonin: 0.29 ng/mL

## 2012-09-21 MED ORDER — OMEGA-3 FATTY ACIDS 1000 MG PO CAPS: 2.0000 g | ORAL_CAPSULE | Freq: Every day | ORAL | Status: DC

## 2012-09-21 MED ORDER — ATORVASTATIN CALCIUM 40 MG PO TABS
40.0000 mg | ORAL_TABLET | Freq: Every day | ORAL | Status: DC
  Administered 2012-09-21 – 2012-09-27 (×7): 40 mg via ORAL
  Filled 2012-09-21 (×8): qty 1

## 2012-09-21 MED ORDER — CIPROFLOXACIN IN D5W 400 MG/200ML IV SOLN
400.0000 mg | INTRAVENOUS | Status: DC
  Administered 2012-09-21: 400 mg via INTRAVENOUS
  Filled 2012-09-21 (×2): qty 200

## 2012-09-21 MED ORDER — METOPROLOL SUCCINATE ER 25 MG PO TB24
25.0000 mg | ORAL_TABLET | Freq: Two times a day (BID) | ORAL | Status: DC
  Administered 2012-09-21 – 2012-09-28 (×13): 25 mg via ORAL
  Filled 2012-09-21 (×19): qty 1

## 2012-09-21 MED ORDER — METHYLPREDNISOLONE SODIUM SUCC 125 MG IJ SOLR
80.0000 mg | Freq: Three times a day (TID) | INTRAMUSCULAR | Status: DC
  Administered 2012-09-21 (×2): 80 mg via INTRAVENOUS
  Filled 2012-09-21 (×5): qty 1.28

## 2012-09-21 MED ORDER — FLUCONAZOLE 100 MG PO TABS
100.0000 mg | ORAL_TABLET | Freq: Every day | ORAL | Status: DC
  Administered 2012-09-21 – 2012-09-27 (×7): 100 mg via ORAL
  Filled 2012-09-21 (×8): qty 1

## 2012-09-21 MED ORDER — EZETIMIBE 10 MG PO TABS
10.0000 mg | ORAL_TABLET | Freq: Every day | ORAL | Status: DC
  Administered 2012-09-21 – 2012-09-27 (×7): 10 mg via ORAL
  Filled 2012-09-21 (×8): qty 1

## 2012-09-21 MED ORDER — NITROGLYCERIN 0.4 MG SL SUBL: 0.4000 mg | SUBLINGUAL_TABLET | SUBLINGUAL | Status: DC | PRN

## 2012-09-21 MED ORDER — HYPROMELLOSE (GONIOSCOPIC) 2.5 % OP SOLN
2.0000 [drp] | OPHTHALMIC | Status: DC | PRN
  Filled 2012-09-21: qty 15

## 2012-09-21 MED ORDER — ACETAMINOPHEN 650 MG RE SUPP: 650.0000 mg | Freq: Four times a day (QID) | RECTAL | Status: DC | PRN

## 2012-09-21 MED ORDER — EYE VITAMINS PO TABS: 1.0000 | ORAL_TABLET | Freq: Two times a day (BID) | ORAL | Status: DC

## 2012-09-21 MED ORDER — POTASSIUM CHLORIDE CRYS ER 20 MEQ PO TBCR
20.0000 meq | EXTENDED_RELEASE_TABLET | Freq: Every day | ORAL | Status: DC
  Administered 2012-09-21 – 2012-09-27 (×7): 20 meq via ORAL
  Filled 2012-09-21 (×10): qty 1

## 2012-09-21 MED ORDER — ALBUTEROL SULFATE (5 MG/ML) 0.5% IN NEBU
2.5000 mg | INHALATION_SOLUTION | Freq: Four times a day (QID) | RESPIRATORY_TRACT | Status: DC
  Administered 2012-09-21 – 2012-09-22 (×5): 2.5 mg via RESPIRATORY_TRACT
  Filled 2012-09-21 (×5): qty 0.5

## 2012-09-21 MED ORDER — SODIUM CHLORIDE 0.9 % IJ SOLN
3.0000 mL | Freq: Two times a day (BID) | INTRAMUSCULAR | Status: DC
  Administered 2012-09-21 – 2012-09-27 (×7): 3 mL via INTRAVENOUS

## 2012-09-21 MED ORDER — OXYCODONE HCL 5 MG PO TABS
5.0000 mg | ORAL_TABLET | ORAL | Status: DC | PRN
  Administered 2012-09-21 – 2012-09-27 (×11): 5 mg via ORAL
  Filled 2012-09-21 (×11): qty 1

## 2012-09-21 MED ORDER — ONDANSETRON HCL 4 MG PO TABS: 4.0000 mg | ORAL_TABLET | Freq: Four times a day (QID) | ORAL | Status: DC | PRN

## 2012-09-21 MED ORDER — ONDANSETRON HCL 4 MG/2ML IJ SOLN: 4.0000 mg | Freq: Four times a day (QID) | INTRAMUSCULAR | Status: DC | PRN

## 2012-09-21 MED ORDER — MINERAL OIL RE ENEM
1.0000 | ENEMA | Freq: Every day | RECTAL | Status: DC
  Administered 2012-09-22 – 2012-09-27 (×4): 1 via RECTAL
  Filled 2012-09-21 (×9): qty 1

## 2012-09-21 MED ORDER — SODIUM CHLORIDE 0.9 % IV SOLN: 250.0000 mL | INTRAVENOUS | Status: DC | PRN

## 2012-09-21 MED ORDER — ALPRAZOLAM 0.5 MG PO TABS
0.5000 mg | ORAL_TABLET | Freq: Three times a day (TID) | ORAL | Status: DC | PRN
  Administered 2012-09-21 – 2012-09-27 (×12): 0.5 mg via ORAL
  Filled 2012-09-21 (×6): qty 1
  Filled 2012-09-21: qty 2
  Filled 2012-09-21 (×5): qty 1

## 2012-09-21 MED ORDER — CHLORHEXIDINE GLUCONATE CLOTH 2 % EX PADS
6.0000 | MEDICATED_PAD | Freq: Every day | CUTANEOUS | Status: AC
  Administered 2012-09-22 – 2012-09-25 (×4): 6 via TOPICAL

## 2012-09-21 MED ORDER — ASPIRIN 81 MG PO TABS: 81.0000 mg | ORAL_TABLET | Freq: Every day | ORAL | Status: DC

## 2012-09-21 MED ORDER — VANCOMYCIN HCL IN DEXTROSE 1-5 GM/200ML-% IV SOLN
1000.0000 mg | Freq: Once | INTRAVENOUS | Status: AC
  Administered 2012-09-21: 1000 mg via INTRAVENOUS
  Filled 2012-09-21: qty 200

## 2012-09-21 MED ORDER — VITAMIN E 180 MG (400 UNIT) PO CAPS
400.0000 [IU] | ORAL_CAPSULE | Freq: Two times a day (BID) | ORAL | Status: DC
  Administered 2012-09-21 – 2012-09-27 (×13): 400 [IU] via ORAL
  Filled 2012-09-21 (×15): qty 1

## 2012-09-21 MED ORDER — HEPARIN SODIUM (PORCINE) 5000 UNIT/ML IJ SOLN: 5000.0000 [IU] | Freq: Three times a day (TID) | INTRAMUSCULAR | Status: DC

## 2012-09-21 MED ORDER — DILTIAZEM HCL ER COATED BEADS 180 MG PO CP24
180.0000 mg | ORAL_CAPSULE | Freq: Two times a day (BID) | ORAL | Status: DC
  Administered 2012-09-21 – 2012-09-27 (×13): 180 mg via ORAL
  Filled 2012-09-21 (×16): qty 1

## 2012-09-21 MED ORDER — GUAIFENESIN ER 600 MG PO TB12
1200.0000 mg | ORAL_TABLET | Freq: Two times a day (BID) | ORAL | Status: DC
  Administered 2012-09-21 – 2012-09-27 (×13): 1200 mg via ORAL
  Filled 2012-09-21 (×15): qty 2

## 2012-09-21 MED ORDER — MUPIROCIN 2 % EX OINT
1.0000 "application " | TOPICAL_OINTMENT | Freq: Two times a day (BID) | CUTANEOUS | Status: AC
  Administered 2012-09-21 – 2012-09-25 (×9): 1 via NASAL
  Filled 2012-09-21: qty 22

## 2012-09-21 MED ORDER — INSULIN GLARGINE 100 UNIT/ML ~~LOC~~ SOLN
3.0000 [IU] | Freq: Every day | SUBCUTANEOUS | Status: DC
  Administered 2012-09-21 – 2012-09-23 (×3): 3 [IU] via SUBCUTANEOUS
  Filled 2012-09-21 (×3): qty 0.03

## 2012-09-21 MED ORDER — FUROSEMIDE 10 MG/ML IJ SOLN: 80.0000 mg | Freq: Two times a day (BID) | INTRAMUSCULAR | Status: DC

## 2012-09-21 MED ORDER — SENNOSIDES-DOCUSATE SODIUM 8.6-50 MG PO TABS: 3.0000 | ORAL_TABLET | Freq: Every day | ORAL | Status: DC

## 2012-09-21 MED ORDER — MAGNESIUM OXIDE 400 (241.3 MG) MG PO TABS
400.0000 mg | ORAL_TABLET | Freq: Every day | ORAL | Status: DC
  Administered 2012-09-21 – 2012-09-27 (×7): 400 mg via ORAL
  Filled 2012-09-21 (×8): qty 1

## 2012-09-21 MED ORDER — AMIODARONE HCL 200 MG PO TABS
200.0000 mg | ORAL_TABLET | Freq: Two times a day (BID) | ORAL | Status: DC
  Administered 2012-09-21 – 2012-09-27 (×13): 200 mg via ORAL
  Filled 2012-09-21 (×15): qty 1

## 2012-09-21 MED ORDER — VITAMIN B-12 100 MCG PO TABS
50.0000 ug | ORAL_TABLET | Freq: Every day | ORAL | Status: DC
  Administered 2012-09-21 – 2012-09-27 (×7): 50 ug via ORAL
  Filled 2012-09-21 (×8): qty 1

## 2012-09-21 MED ORDER — DEXTROSE 5 % IV SOLN
2.0000 g | Freq: Once | INTRAVENOUS | Status: AC
  Administered 2012-09-21: 2 g via INTRAVENOUS
  Filled 2012-09-21: qty 2

## 2012-09-21 MED ORDER — ASPIRIN EC 81 MG PO TBEC
81.0000 mg | DELAYED_RELEASE_TABLET | Freq: Every day | ORAL | Status: DC
  Administered 2012-09-21 – 2012-09-27 (×7): 81 mg via ORAL
  Filled 2012-09-21 (×8): qty 1

## 2012-09-21 MED ORDER — NITROGLYCERIN 0.4 MG SL SUBL
0.4000 mg | SUBLINGUAL_TABLET | Freq: Once | SUBLINGUAL | Status: AC
  Administered 2012-09-21: 0.4 mg via SUBLINGUAL

## 2012-09-21 MED ORDER — PANTOPRAZOLE SODIUM 40 MG PO TBEC
40.0000 mg | DELAYED_RELEASE_TABLET | Freq: Every day | ORAL | Status: DC
  Administered 2012-09-21 – 2012-09-27 (×7): 40 mg via ORAL
  Filled 2012-09-21 (×6): qty 1

## 2012-09-21 MED ORDER — CITALOPRAM HYDROBROMIDE 10 MG PO TABS
10.0000 mg | ORAL_TABLET | Freq: Every day | ORAL | Status: DC
  Administered 2012-09-22 – 2012-09-27 (×6): 10 mg via ORAL
  Filled 2012-09-21 (×7): qty 1

## 2012-09-21 MED ORDER — INSULIN ASPART 100 UNIT/ML ~~LOC~~ SOLN
0.0000 [IU] | Freq: Three times a day (TID) | SUBCUTANEOUS | Status: DC
  Administered 2012-09-22 (×3): 3 [IU] via SUBCUTANEOUS
  Administered 2012-09-23 (×2): 5 [IU] via SUBCUTANEOUS
  Administered 2012-09-23: 9 [IU] via SUBCUTANEOUS
  Administered 2012-09-24 (×2): 2 [IU] via SUBCUTANEOUS
  Administered 2012-09-24: 5 [IU] via SUBCUTANEOUS
  Administered 2012-09-25: 1 [IU] via SUBCUTANEOUS
  Administered 2012-09-25: 2 [IU] via SUBCUTANEOUS
  Administered 2012-09-25 – 2012-09-26 (×2): 1 [IU] via SUBCUTANEOUS
  Administered 2012-09-26: 5 [IU] via SUBCUTANEOUS
  Administered 2012-09-26: 2 [IU] via SUBCUTANEOUS
  Administered 2012-09-27 (×3): 1 [IU] via SUBCUTANEOUS
  Administered 2012-09-28: 2 [IU] via SUBCUTANEOUS

## 2012-09-21 MED ORDER — SENNA 8.6 MG PO TABS
1.0000 | ORAL_TABLET | Freq: Two times a day (BID) | ORAL | Status: DC
  Administered 2012-09-21 – 2012-09-27 (×13): 8.6 mg via ORAL
  Filled 2012-09-21 (×15): qty 1

## 2012-09-21 MED ORDER — OMEGA-3-ACID ETHYL ESTERS 1 G PO CAPS
2.0000 g | ORAL_CAPSULE | Freq: Every day | ORAL | Status: DC
  Administered 2012-09-21 – 2012-09-27 (×7): 2 g via ORAL
  Filled 2012-09-21 (×9): qty 2

## 2012-09-21 MED ORDER — MAGNESIUM SULFATE 40 MG/ML IJ SOLN
2.0000 g | Freq: Once | INTRAMUSCULAR | Status: AC
  Administered 2012-09-21: 2 g via INTRAVENOUS
  Filled 2012-09-21: qty 50

## 2012-09-21 MED ORDER — VANCOMYCIN HCL IN DEXTROSE 1-5 GM/200ML-% IV SOLN: 1000.0000 mg | INTRAVENOUS | Status: DC

## 2012-09-21 MED ORDER — ADULT MULTIVITAMIN W/MINERALS CH
1.0000 | ORAL_TABLET | Freq: Every day | ORAL | Status: DC
  Administered 2012-09-21 – 2012-09-27 (×7): 1 via ORAL
  Filled 2012-09-21 (×8): qty 1

## 2012-09-21 MED ORDER — SODIUM CHLORIDE 0.9 % IJ SOLN
3.0000 mL | Freq: Two times a day (BID) | INTRAMUSCULAR | Status: DC
  Administered 2012-09-21 – 2012-09-27 (×7): 3 mL via INTRAVENOUS

## 2012-09-21 MED ORDER — SODIUM CHLORIDE 0.9 % IJ SOLN: 3.0000 mL | INTRAMUSCULAR | Status: DC | PRN

## 2012-09-21 MED ORDER — VITAMIN E 45 MG (100 UNIT) PO CAPS: 600.0000 [IU] | ORAL_CAPSULE | Freq: Two times a day (BID) | ORAL | Status: DC

## 2012-09-21 MED ORDER — IPRATROPIUM BROMIDE 0.02 % IN SOLN
0.5000 mg | Freq: Four times a day (QID) | RESPIRATORY_TRACT | Status: DC
  Administered 2012-09-21 – 2012-09-22 (×5): 0.5 mg via RESPIRATORY_TRACT
  Filled 2012-09-21 (×5): qty 2.5

## 2012-09-21 MED ORDER — DOCUSATE SODIUM 100 MG PO CAPS
100.0000 mg | ORAL_CAPSULE | Freq: Two times a day (BID) | ORAL | Status: DC
  Administered 2012-09-21 – 2012-09-27 (×13): 100 mg via ORAL
  Filled 2012-09-21 (×15): qty 1

## 2012-09-21 MED ORDER — ENSURE COMPLETE PO LIQD
237.0000 mL | ORAL | Status: DC
  Administered 2012-09-21: 237 mL via ORAL

## 2012-09-21 MED ORDER — IPRATROPIUM BROMIDE 0.02 % IN SOLN
0.5000 mg | Freq: Once | RESPIRATORY_TRACT | Status: AC
  Administered 2012-09-21: 0.5 mg via RESPIRATORY_TRACT

## 2012-09-21 MED ORDER — MULTIVITAMINS PO TABS: 1.0000 | ORAL_TABLET | Freq: Every day | ORAL | Status: DC

## 2012-09-21 MED ORDER — FUROSEMIDE 10 MG/ML IJ SOLN
100.0000 mg | Freq: Three times a day (TID) | INTRAVENOUS | Status: DC
  Filled 2012-09-21 (×2): qty 10

## 2012-09-21 MED ORDER — ALBUTEROL SULFATE (5 MG/ML) 0.5% IN NEBU
5.0000 mg | INHALATION_SOLUTION | Freq: Once | RESPIRATORY_TRACT | Status: AC
  Administered 2012-09-21: 5 mg via RESPIRATORY_TRACT

## 2012-09-21 MED ORDER — FUROSEMIDE 80 MG PO TABS
160.0000 mg | ORAL_TABLET | Freq: Two times a day (BID) | ORAL | Status: DC
  Administered 2012-09-21 – 2012-09-22 (×2): 160 mg via ORAL
  Filled 2012-09-21 (×4): qty 2

## 2012-09-21 MED ORDER — HEPARIN SODIUM (PORCINE) 5000 UNIT/ML IJ SOLN
5000.0000 [IU] | Freq: Three times a day (TID) | INTRAMUSCULAR | Status: DC
  Administered 2012-09-21 – 2012-09-22 (×3): 5000 [IU] via SUBCUTANEOUS
  Filled 2012-09-21 (×5): qty 1

## 2012-09-21 MED ORDER — ACETAMINOPHEN 325 MG PO TABS: 650.0000 mg | ORAL_TABLET | Freq: Four times a day (QID) | ORAL | Status: DC | PRN

## 2012-09-21 MED ORDER — SODIUM CHLORIDE 0.9 % IV SOLN
250.0000 mg | Freq: Four times a day (QID) | INTRAVENOUS | Status: DC
  Administered 2012-09-21 – 2012-09-22 (×3): 250 mg via INTRAVENOUS
  Filled 2012-09-21 (×6): qty 250

## 2012-09-21 MED ORDER — DEXTROSE 5 % IV SOLN: 2.0000 g | INTRAVENOUS | Status: DC

## 2012-09-21 MED ORDER — METHYLPREDNISOLONE SODIUM SUCC 125 MG IJ SOLR
125.0000 mg | Freq: Once | INTRAMUSCULAR | Status: AC
  Administered 2012-09-21: 125 mg via INTRAVENOUS
  Filled 2012-09-21: qty 2

## 2012-09-21 NOTE — ED Notes (Signed)
BIB [facility]. From home via EMS (resp dist). Home O2 80's sats. Transport on BiPap. Pacemaker. Port-a-cath in situ.  RUE:[id number] 70hr 96%Bipap CBG 201. Albuterol 10mg , Ipratropium 0.5mg 

## 2012-09-21 NOTE — Consult Note (Signed)
CARDIOLOGY CONSULT NOTE  Patient ID: [name] Guin., MRN: [id number], DOB/AGE: 1932/05/08 79 y.o. Admit date: 09/26/2012   Date of Consult: 10/06/2012 Primary Physician: [name], MD Primary Cardiologist: Patty Sermons  (EP - Graciela Husbands)  Chief Complaint: SOB, weakness, lethargic Reason for Consult: SOB, afib, CHF  HPI: [name] Stevenson is a 79 y/o M with complex PMH including chronic respiratory failure on home O2 secondary to COPD on chronic prednisone, PAF, HTN, chronic diastolic CHF (EF 11-91% in 08/2012), SSS s/p pacemaker, nonobstructive CAD 2006, DM, CKD, EtOH use (prior h/o DTs), lung cancer, OSA, and recent prolonged hopsitalization for PNA/CHF/COPD/hemoptysis. He came to the [facility] today due to SOB. We are asked to assist in management of atrial fibrillation and CHF.  He was in the hospital for a prolonged admission 6/12-09/16/12 for PNA (bronchial lavage + ESBL E Coli, candida albicans -> treated with Levaquin, Primaxin, diflucan), diastolic CHF (complicated by PAF, low albumin, CKD, requiring metolazone and high dose Lasix), pulmonary edema/anasarca/ascites (improved with diuresis and ACE wrapping), rash (felt to be disseminated fungal infection, also treated with Valtrex for possible buttocks shingles), and hemoptysis. During that admission, he was started anticoag for afib but after just 2 days of heparin drip then transition to 1 dose of Xarelto, he developed hemoptysis that lasted 2 weeks of his hospitalization. Bronchoscopy 6/17 did not determine a source - bronchial washings did not show malignant cells on path. ENT did not find any pathology in the upper most respiratory/GI tract by fiberoptic laryngoscopy. A second bronchoscopy was done on 09/11/12 - this time a lesion along the RML suture line from previous lobectomy was found, but without evidence for malignancy on biopsy. He did have a new lung nodule seen on chest CT and close outpatient f/u was recommended. Dr. Graciela Husbands with had been following the  patient primarily. After [name] Stevenson developed hemoptysis, anticoagulation was discontinued and he was not a candidate for cardioversion during that admission due to need for anticoag. Dr. Graciela Husbands recommended to wait several weeks, then re-visit the role of cardioversion and/or anticoagulation. Amiodarone was started - it was continued at discharge, but it was felt that a decision would need to be made in the future about long-term use if he is not cardioverted. Outpt followup was recommended.  He has not done well since discharge on 09/16/12. On 7/3, his wife says he sustained a fall just because he was so weak when he stood up. He sustained L hand skin tears. He did not pass out. HHRN tried to get him to come to the hospital but he refused. Since that time he's gotten progressively weaker and more SOB. EMS was called today due to respiratory distress. He has been unable to get up without assistance. Upon EMS arrival, O2 sat was in the 80s and he was brought in on bipap. He received albuterol and atrovent nebs. He has had production of yellowish sputum but no further hemoptysis. Labs show Na 131, K 3.1, Cl 85, CO2 33, BUN 86, Cr 2.75 (1 week ago, 66/2.57), albumin 2.4, troponin neg x 1, pBNP 806 (previously 6000-7500 when in CHF). WBC 18.9, elevated neutrophils. Lactic acid 1.91 and procalcitonin 0.29. CXR shows Persistent effusions and lower lobe atelectasis/infiltrate. He was reportedly lethargic on admission but currently is more alert. SOB has improved somewhat on bipap but he feels very weak and hurts all over. He still has a significant red rash on his buttocks. VSS at present.  Past Medical History  Diagnosis Date  .  Stricture and stenosis of esophagus 12/07/2004    EGD done on 12/07/2004  . GERD (gastroesophageal reflux disease)   . Hypoxemia     With chronic respiratory failure  . Increased prostate specific antigen (PSA) velocity 02/26/2011  . DM (diabetes mellitus) 02/26/2011  . Gout 02/26/2011    . COPD (chronic obstructive pulmonary disease)     a. oxygen at home, 4L. b. On prednisone.  . OSA (obstructive sleep apnea) 02/26/2011    CPAP  . Paroxysmal atrial fibrillation   . Depression   . Cardiac arrest 2009    while in hospital   . Hypertension   . HTN (hypertension) 02/26/2011  . Cardiac arrest 2009    while in hospital  . Non-small cell lung cancer     a. s/p RUL lobectomy, adjuvant chemo 2006. b. New lung nodule seen 08/2012, OP f/u recommended.  . Basal cell carcinoma of nose 02/26/2011  . ETOH abuse 01/29/2012    started drinking again - hx cardiac arrest in 2009 due to dt's with organ failure  . CKD (chronic kidney disease) 02/28/2012  . Chronic diastolic CHF (congestive heart failure)     a. EF 65-70% 08/2012. b. Complex picture with ascites/anasarca/low albumin/CKD.  . Sick sinus syndrome     a. s/p [employer] pacemaker 2010.  . Bradycardia     a. Admitted 2010 with severe bradycardia, runs of paroxysmal VT.   Marland Kitchen Paroxysmal VT     a. In 2010 in setting of marked bradycardia.  Marland Kitchen CAD (coronary artery disease)     a. Nonobstructive CAD by cath 2006.  Marland Kitchen Esophageal ulcer     a. By EGD 10/2011.  Marland Kitchen Hiatal hernia     a. By EGD 10/2011.  . Valvular heart disease     a. Mild MR/mild AI/mild AS by echo 05/2011.  Marland Kitchen Hemoptysis     a. 08/2012:  Bronchoscopy 09/01/12 did not determine a source - bronchial washings did not show malignant cells on path. ENT did not find any pathology in the upper most respiratory/gi tract by fiberoptic laryngoscopy. A second bronchoscopy was done on 09/11/12 - this time a lesion along the RML suture line from previous lobectomy was found, but without evidence for malignancy on biopsy.   . Pneumonia     a. Hosp 08/2012 with + ESBL E coli, candida albicans on BAL tx with levaquin, primaxin, diflucan - complex hospitalization with CKD, anasarca, CHF, afib, hemoptysis.  . Fungal infection     a. Rash 08/2012 - felt to be disseminated fungal infection, also  treated with Valtrex for possible buttocks shingles.  . Hypoalbuminemia     a. 08/2012.      Most Recent Cardiac Studies: 08/27/12 2D Echo - Left ventricle: The cavity size was normal. Wall thickness was increased in a pattern of moderate LVH. Systolic function was vigorous. The estimated ejection fraction was in the range of 65% to 70%. Wall motion was normal; there were no regional wall motion abnormalities. - Left atrium: The atrium was mildly to moderately dilated. - Right atrium: The atrium was mildly to moderately dilated.   Cardiac Cath 10/2004  DESCRIPTION OF PROCEDURE: The patient was brought to cardiac cath lab after  appropriate informed consent. He was prepped and draped in a sterile  fashion. Approximately 20 cc of 1% lidocaine was used for local anesthesia.  A 4-French sheath was placed in right femoral artery without difficulty.  Coronary angiography was then performed. Multiple attempts were made in  placing the pigtail catheter across the aortic valve and were successful;  however, the patient had a significant ventricular ectopy on each passing of  the catheter across the aortic valve. Therefore no ventriculogram was  performed.  RESULTS:  1. Left main: No significant disease.  2. LAD: Diffuse mild to moderate luminal irregularities with mid vessel  showing 30-40% stenosis. It is a moderate-size vessel throughout its  course with a wrap around of the apex.  3. D1/D2: Small vessels with mild luminal irregularity.  4. LCX: Nondominant with mild luminal irregularities.  5. OM1/OM2/OM3: Moderate-sized with mild luminal irregularities.  6. RCA: Dominant with mild to moderate luminal irregularities. No  significant obstructive disease noted in the PDA or PLV.  7. No left ventriculogram was done secondary to ventricular ectopy.  8. Limited right femoral angiogram was performed showing significant right  femoral calcification but no significant obstructive peripheral  vascular  disease was noted.  IMPRESSION:  1. Nonobstructive coronary arteries.  2. History of mild left ventricular dysfunction with an ejection fraction  of 45% by recent stress test.  PLAN:  1. From a cardiovascular standpoint, the patient has no significant  obstructive coronary disease. He will be scheduled for an outpatient  echocardiogram to evaluate for any significant valvular heart disease as  well as to  evaluate RV and LV function. He is moderate risk from his comorbidities  for his surgery; however, no further cardiac workup is needed. He may  proceed with surgery as scheduled. Recommend continued perioperative B-  Blockers, Stains, transfusion as needed should Hb fall < 10 to minimize  cardiac demand.     Surgical History:  Past Surgical History  Procedure Laterality Date  . Lung lobectomy      x2 lobes on RT lung  . Knee arthroscopy      bilateral  . Pace maker    . Tonsillectomy    . Hernia repair      x2, esophageal  . Cardiac catheterization    . Esophagogastroduodenoscopy  10/25/2011    Procedure: ESOPHAGOGASTRODUODENOSCOPY (EGD);  Surgeon: Hart Carwin, MD;  Location: [facility];  Service: Endoscopy;  Laterality: N/A;  . Video bronchoscopy N/A 09/01/2012    Procedure: VIDEO BRONCHOSCOPY WITHOUT FLUORO;  Surgeon: Storm Frisk, MD;  Location: [facility] ENDOSCOPY;  Service: Cardiopulmonary;  Laterality: N/A;  . Video bronchoscopy Bilateral 09/11/2012    Procedure: VIDEO BRONCHOSCOPY WITHOUT FLUORO;  Surgeon: Leslye Peer, MD;  Location: [facility] ENDOSCOPY;  Service: Cardiopulmonary;  Laterality: Bilateral;     Home Meds: Prior to Admission medications   Medication Sig Start Date End Date Taking? Authorizing Provider  albuterol (PROVENTIL HFA;VENTOLIN HFA) 108 (90 BASE) MCG/ACT inhaler Inhale 2 puffs into the lungs every 6 (six) hours as needed for wheezing. 02/27/12 02/26/13 Yes Corwin Levins, MD  ALPRAZolam Prudy Feeler) 0.5 MG tablet Take 1 tablet (0.5 mg total) by mouth  3 (three) times daily as needed. For anxiety/sleeplessness. 04/28/12  Yes Corwin Levins, MD  amiodarone (PACERONE) 200 MG tablet Take 1 tablet (200 mg total) by mouth 2 (two) times daily. 09/16/12  Yes Marianne L York, PA-C  arformoterol (BROVANA) 15 MCG/2ML NEBU Take 2 mLs (15 mcg total) by nebulization 2 (two) times daily. 09/16/12  Yes Tora Kindred York, PA-C  aspirin 81 MG chewable tablet Chew 81 mg by mouth daily.   Yes Historical Provider, MD  atorvastatin (LIPITOR) 40 MG tablet Take 40 mg by mouth at bedtime. 02/27/12  Yes Corwin Levins, MD  budesonide (PULMICORT) 0.25 MG/2ML nebulizer solution Take 4 mLs (0.5 mg total) by nebulization 2 (two) times daily. 09/16/12  Yes Marianne L York, PA-C  citalopram (CELEXA) 10 MG tablet Take 1 tablet (10 mg total) by mouth daily. 06/17/12 06/17/13 Yes Corwin Levins, MD  diltiazem (CARDIZEM CD) 180 MG 24 hr capsule Take 1 capsule (180 mg total) by mouth 2 (two) times daily. 09/16/12  Yes Marianne L York, PA-C  ezetimibe (ZETIA) 10 MG tablet Take 10 mg by mouth daily.   Yes Historical Provider, MD  feeding supplement (ENSURE COMPLETE) LIQD Take 237 mLs by mouth daily. 09/16/12  Yes Marianne L York, PA-C  fish oil-omega-3 fatty acids 1000 MG capsule Take 2 g by mouth daily.     Yes Historical Provider, MD  fluconazole (DIFLUCAN) 100 MG tablet Take 1 tablet (100 mg total) by mouth daily. 09/16/12  Yes Marianne L York, PA-C  furosemide (LASIX) 80 MG tablet Take 2 tablets (160 mg total) by mouth every 8 (eight) hours. 09/16/12  Yes Marianne L York, PA-C  guaiFENesin (MUCINEX) 600 MG 12 hr tablet Take 2 tablets (1,200 mg total) by mouth 2 (two) times daily. 09/16/12  Yes Tora Kindred York, PA-C  hydroxypropyl methylcellulose (ISOPTO TEARS) 2.5 % ophthalmic solution Place 2 drops into both eyes as needed. For dry/irritated eyes.   Yes Historical Provider, MD  potassium chloride SA (K-DUR,KLOR-CON) 20 MEQ tablet Take 1 tablet (20 mEq total) by mouth daily. 02/27/12  Yes Corwin Levins, MD    predniSONE (DELTASONE) 5 MG tablet Take 2 tablets (10 mg total) by mouth daily. 09/16/12  Yes Marianne L York, PA-C  senna-docusate (SENOKOT-S) 8.6-50 MG per tablet Take 3 tablets by mouth at bedtime. 09/16/12  Yes Marianne L York, PA-C  temazepam (RESTORIL) 30 MG capsule Take 1 capsule (30 mg total) by mouth at bedtime. 02/27/12 11/28/13 Yes Corwin Levins, MD  tiotropium (SPIRIVA) 18 MCG inhalation capsule Place 1 capsule (18 mcg total) into inhaler and inhale daily. 02/27/12 02/26/13 Yes Corwin Levins, MD  vitamin B-12 (CYANOCOBALAMIN) 100 MCG tablet Take 50 mcg by mouth daily.     Yes Historical Provider, MD  vitamin E 600 UNIT capsule Take 600 Units by mouth 2 (two) times daily.     Yes Historical Provider, MD  insulin aspart (NOVOLOG) 100 UNIT/ML injection CBG 70 - 120: 0 units      CBG 121 - 150: 1 unit      CBG 151 - 200: 2 units      CBG 201 - 250: 3 units      CBG 251 - 300: 5 units      CBG 301 - 350: 7 units      CBG 351 - 400 9 units 09/16/12   Clydia Llano, MD  insulin glargine (LANTUS) 100 UNIT/ML injection Inject 0.08 mLs (8 Units total) into the skin at bedtime. 09/16/12   Tora Kindred York, PA-C  Insulin Syringe-Needle U-100 (B-D INSULIN SYRINGE) 31G X 5/16" 0.3 ML MISC 1 Syringe by Does not apply route 4 (four) times daily. 09/16/12   Tora Kindred York, PA-C  levalbuterol (XOPENEX) 0.63 MG/3ML nebulizer solution Take 3 mLs (0.63 mg total) by nebulization every 3 (three) hours as needed for wheezing or shortness of breath. 09/16/12   Tora Kindred York, PA-C  magnesium oxide (DIASENSE MAGNESIUM) 400 (241.3 MG) MG tablet Take 1 tablet (400 mg total) by mouth daily. 02/27/12   Corwin Levins, MD  metoprolol succinate (TOPROL-XL)  25 MG 24 hr tablet Take 1 tablet (25 mg total) by mouth 2 (two) times daily with a meal. 09/16/12   Tora Kindred York, PA-C  Multiple Vitamins-Minerals (EYE VITAMINS) TABS Take 1 tablet by mouth 2 (two) times daily.      Historical Provider, MD  multivitamin [facility]) per tablet  Take 1 tablet by mouth daily.      Historical Provider, MD  nitroGLYCERIN (NITROSTAT) 0.4 MG SL tablet Place 1 tablet (0.4 mg total) under the tongue every 5 (five) minutes as needed for chest pain. 08/14/12   Cassell Clement, MD  omeprazole (PRILOSEC) 20 MG capsule Take 1 tablet by mouth once daily. PHARMACY-PLEASE D/C NEXIUM RX...TOO EXPENSIVE FOR PATIENT. 04/23/12   Hart Carwin, MD  oxyCODONE (OXY IR/ROXICODONE) 5 MG immediate release tablet Take 1 tablet (5 mg total) by mouth every 4 (four) hours as needed. 09/16/12   Stephani Police, PA-C    Inpatient Medications:  . albuterol  2.5 mg Nebulization Q6H WA  . ciprofloxacin  400 mg Intravenous Q24H  . imipenem-cilastatin  250 mg Intravenous Q6H  . ipratropium  0.5 mg Nebulization Q6H      Allergies: No Known Allergies  History   Social History  . Marital Status: Married    Spouse Name: N/A    Number of Children: 3  . Years of Education: N/A   Occupational History  . RETIRED     truck driver   Social History Main Topics  . Smoking status: Former Smoker -- 1.50 packs/day for 50 years    Types: Cigarettes    Quit date: 06/28/1996  . Smokeless tobacco: Current User    Types: Chew  . Alcohol Use: Yes     Comment: 2 beers 2-3 times /week  . Drug Use: No  . Sexually Active: Not Currently   Other Topics Concern  . Not on file   Social History Narrative  . No narrative on file     Family History  Problem Relation Age of Onset  . Heart disease Father   . Lung cancer Mother   . Cancer Other     lung cancer  . Heart disease Other   . Hypertension Other      Review of Systems: General: negative for chills, fever Cardiovascular: negative for chest pain, edema. Has not exerted himself so unclear if any DOE or CP with exertion. Has had orthopnea. Dermatological: negative for rash Respiratory: negative for cough or wheezing. No further hemoptysis Urologic: negative for hematuria Abdominal: negative for nausea, vomiting,  diarrhea, bright red blood per rectum, melena, or hematemesis Neurologic: negative for syncope All other systems reviewed and are otherwise negative except as noted above.  Labs:  Recent Labs  09/25/2012 1211  TROPONINI <0.30   Lab Results  Component Value Date   WBC 18.9* 09/28/2012   HGB 11.5* 09/23/2012   HCT 34.1* 10/04/2012   MCV 94.7 10/02/2012   PLT 242 09/16/2012     Recent Labs Lab 10/07/2012 1211  NA 131*  K 3.1*  CL 85*  CO2 33*  BUN 86*  CREATININE 2.75*  CALCIUM 8.6  PROT 5.9*  BILITOT 0.5  ALKPHOS 110  ALT 37  AST 31  GLUCOSE 194*   Lab Results  Component Value Date   CHOL 156 08/25/2012   HDL 53.60 08/25/2012   LDLCALC 84 08/25/2012   TRIG 94.0 08/25/2012    Radiology/Studies:  Ct Chest Wo Contrast LAST ADMISSION 09/08/2012   *RADIOLOGY REPORT*  Clinical  Data: Hemoptysis.  Prior right upper lobectomy for non- small cell lung cancer with chemotherapy 2006.  CT CHEST WITHOUT CONTRAST  Technique:  Multidetector CT imaging of the chest was performed following the standard protocol without IV contrast.  Comparison: Several prior CT scans most recent 08/30/2012 and 02/05/2012  Findings: There is been minimal improvement in the pulmonary parenchymal changes involving both lungs asymmetric greater on the right.  What remains may represent result of infection/aspiration. Interstitial spread of tumor not entirely excluded although a secondary less likely consideration.  Follow-up until complete clearance is recommended given the patient's history.  5 mm left upper lobe nodule  new compared to 2013 examination. Tumor not excluded.  Top normal sized low pretracheal lymph node with short axis dimension of 9.3 mm.  Cardiomegaly with sequential pacemaker in place.  Prominent coronary artery calcifications.  No pericardial effusion.  Atherosclerotic type changes of the aorta with ectasia similar to the prior exam.  Visualized upper abdominal structures without acute abnormality.  No bony  destructive lesion.  Degenerative changes thoracic spine.  IMPRESSION: There is been minimal improvement in the pulmonary parenchymal changes involving both lungs asymmetric greater on the right.  What remains may represent result of infection/aspiration.  Interstitial spread of tumor not entirely excluded although a secondary less likely consideration.  Follow-up until complete clearance is recommended given the patient's history.  5 mm left upper lobe nodule  new compared to 2013 examination. Tumor not excluded.  Please see above.   Original Report Authenticated By: Lacy Duverney, [name]   Dg Chest Portable 1 View 10/11/2012   *RADIOLOGY REPORT*  Clinical Data: Respiratory distress.  PORTABLE CHEST - 1 VIEW  Comparison: 09/14/2012  Findings: Dual lead pacemaker is in place.  Left subclavian central line has its tip in the SVC.  Cardiac silhouette is enlarged. There is pleural fluid bilaterally with some atelectasis in the lower lungs.  Upper lungs are clear.  IMPRESSION: Persistent effusions and lower lobe atelectasis/infiltrate.   Original Report Authenticated By: Paulina Fusi, [name]   EKG: V paced, intermittent intrinsic beats rate ~65  Physical Exam: Blood pressure 141/77, pulse 72, temperature 97.5 F (36.4 C), temperature source Axillary, resp. rate 23, height 5' 6.93" (1.7 m), weight 192 lb 0.3 oz (87.1 kg), SpO2 92.00%. General: Chronically ill appearing WM laying flat in bed on bipap, no acute distress Head: Normocephalic, atraumatic, sclera non-icteric, no xanthomas, nares are without discharge.  Neck:  JVD hard to see. Does not appear elevated. Lungs: Coarse, rhonchorous throughout. On bipap. No rales or wheezes. Breathing is unlabored. Heart: RRR with S1 S2. No murmurs, rubs, or gallops appreciated. Abdomen: Soft, non-tender, non-distended with normoactive bowel sounds. No hepatomegaly. No rebound/guarding. No obvious abdominal masses. Msk:  Strength and tone appear normal for age. Extremities:  No clubbing or cyanosis. No edema except for RUE. LUE skin tear  Distal pedal pulses are in tact 2+ bilaterally. Neuro: Alert and oriented X 3. No focal deficit. Psych:  Responds to questions appropriately with a normal affect.   Assessment and Plan:  1. Acute on chronic respiratory failure, multifactorial, with persistent pleural effusions and possible LLL infiltrate 2. HCAP with recent hospitalization for PNA (+ESBL E coli and candida by BAL 08/2012) 3. Severe COPD with exacerbation, on chronic O2 and steroids at home 4. Chronic diastolic CHF 5. Acute on chronic kidney disease 6. Paroxysmal atrial fibrillation 7. Recent hemoptysis 08/2012 for 2 weeks 8. Known lung CA (BALs neg for malignancy last admission during hemoptysis, but  new pulm nodule noted on CT chest) 9. HTN 10. Recent fungal skin infection on buttocks  11. Hypoalbuminemia 12. SSS s/p pacemaker  Signed, Dayna Dunn PA-C 10/05/2012, 4:00 PM   Patient seen and examined with Ronie Spies, PA-C. We discussed all aspects of the encounter. I agree with the assessment and plan as stated above.   The main issues now for [name] Stevenson appear to respiratory failure due to an infectious etiology and an overall general decline. From cardiac perspective there does not appear to be much to do. His volume status does not appear elevated and in fact his BNP is far lower than it has been in a long time. AF also remains rate controlled.  Will continue lasix 160 po bid (was on tid at home) and current AF regimen. Not candidate for anticoagulation due to recent hemoptysis. Can increase lasix or switch to VI to keep I/Os even. Will place Foley at wife's request due to inability to stand and incontinence.  RUE is swollen and this may be related to PPM or DVT but not candidate for anti-coagulation so will not pursue aggressive w/u.  Given his weakness and mounting medical problems, I suspect best plan at this point is to consider Palliative Care  involvement.  Daniel Bensimhon,MD 4:54 PM

## 2012-09-21 NOTE — ED Notes (Signed)
Aerosol tx.'s given late due to acute increased acuity, called for more staff.

## 2012-09-21 NOTE — Consult Note (Signed)
PULMONARY  / CRITICAL CARE MEDICINE  Name: [name] NEPOMUCENO Sr. MRN: [id number] DOB: 10-09-1932    ADMISSION DATE:  21-Oct-2012 CONSULTATION DATE:  2012-10-21  REFERRING MD :  Dewayne Shorter PRIMARY SERVICE:  Internal Medicine  CHIEF COMPLAINT:  SOB  BRIEF PATIENT DESCRIPTION: 79 year old male W/ PMH Chronic resp failure on chronic pred and oxygen), admitted 7/7 with SOB and acute on chronicrespiratory failure (just discharged 7/2).   SIGNIFICANT EVENTS / STUDIES:  7/7 Admit  LINES / TUBES: R CW PAC >>  CULTURES: 7/7 blood x2 >>  ANTIBIOTICS:  COmpleted 10 ds of Primaxin 6/30 7/7 Cefepime >> 7/7 7/7 Vancomycin >> 7/7 Imipenem >> 7/7 Cipro >>  HISTORY OF PRESENT ILLNESS:  [name] Stevenson is a 78 year old male with a history of COPD and recent ESBL pneumonia. He was discharged on 7/2 (adm 6/12 ) and sustained a fall on day of discharge, with difficulty getting him up. His family notes that he has been SOB since discharge and has been requiring 4 L/min [place] at home. His SOB has continued to worsen and he is bringing up yellow sputum.   His SOB acutely worsened today and EMS was called. On arrival they noted SpO2 in the 80's, and was placed on CPAP by EMS, which was transitioned to BiPaP by the ED. He has received broad spectrum antibiotics while in ED.  PMH  Of NSCLC s/p RULectomy and adjuvant chemo in 2006    We are consulted for respiratory failure.  PAST MEDICAL HISTORY :  Past Medical History  Diagnosis Date  . Stricture and stenosis of esophagus 12/07/2004    EGD done on 12/07/2004  . GERD (gastroesophageal reflux disease)   . Hypoxemia     With chronic respiratory failure  . Increased prostate specific antigen (PSA) velocity 02/26/2011  . DM (diabetes mellitus) 02/26/2011  . Gout 02/26/2011  . COPD (chronic obstructive pulmonary disease)     a. oxygen at home, 4L. b. On prednisone.  . OSA (obstructive sleep apnea) 02/26/2011    CPAP  . Paroxysmal atrial fibrillation   . Depression    . Cardiac arrest 2009    while in hospital   . Hypertension   . HTN (hypertension) 02/26/2011  . Cardiac arrest 2009    while in hospital  . Lung cancer     a. lung s/p lobectomy x2 on RT lung b. New lung nodule seen 08/2012, OP f/u recommended.  . Basal cell carcinoma of nose 02/26/2011  . ETOH abuse 01/29/2012    started drinking again - hx cardiac arrest in 2009 due to dt's with organ failure  . CKD (chronic kidney disease) 02/28/2012  . Chronic diastolic CHF (congestive heart failure)     a. EF 65-70% 08/2012. b. Complex picture with ascites/anasarca/low albumin/CKD.  . Sick sinus syndrome     a. s/p [employer] pacemaker 2010.  . Bradycardia     a. Admitted 2010 with severe bradycardia, runs of paroxysmal VT.   Marland Kitchen Paroxysmal VT     a. In 2010 in setting of marked bradycardia.  Marland Kitchen CAD (coronary artery disease)     a. Nonobstructive CAD by cath 2006.  Marland Kitchen Esophageal ulcer     a. By EGD 10/2011.  Marland Kitchen Hiatal hernia     a. By EGD 10/2011.  . Valvular heart disease     a. Mild MR/mild AI/mild AS by echo 05/2011.  Marland Kitchen Hemoptysis     a. 08/2012:  Bronchoscopy 09/01/12 did not determine  a source - bronchial washings did not show malignant cells on path. ENT did not find any pathology in the upper most respiratory/gi tract by fiberoptic laryngoscopy. A second bronchoscopy was done on 09/11/12 - this time a lesion along the RML suture line from previous lobectomy was found, but without evidence for malignancy on biopsy.   . Pneumonia     a. Hosp 08/2012 with + ESBL E coli, candida albicans on BAL tx with levaquin, primaxin, diflucan - complex hospitalization with CKD, anasarca, CHF, afib, hemoptysis.  . Fungal infection     a. Rash 08/2012 - felt to be disseminated fungal infection, also treated with Valtrex for possible buttocks shingles.  . Hypoalbuminemia     a. 08/2012.   Past Surgical History  Procedure Laterality Date  . Lung lobectomy      x2 lobes on RT lung  . Knee arthroscopy       bilateral  . Pace maker    . Tonsillectomy    . Hernia repair      x2, esophageal  . Cardiac catheterization    . Esophagogastroduodenoscopy  10/25/2011    Procedure: ESOPHAGOGASTRODUODENOSCOPY (EGD);  Surgeon: Hart Carwin, MD;  Location: [facility];  Service: Endoscopy;  Laterality: N/A;  . Video bronchoscopy N/A 09/01/2012    Procedure: VIDEO BRONCHOSCOPY WITHOUT FLUORO;  Surgeon: Storm Frisk, MD;  Location: [facility] ENDOSCOPY;  Service: Cardiopulmonary;  Laterality: N/A;  . Video bronchoscopy Bilateral 09/11/2012    Procedure: VIDEO BRONCHOSCOPY WITHOUT FLUORO;  Surgeon: Leslye Peer, MD;  Location: [facility] ENDOSCOPY;  Service: Cardiopulmonary;  Laterality: Bilateral;   Prior to Admission medications   Medication Sig Start Date End Date Taking? Authorizing Provider  albuterol (PROVENTIL HFA;VENTOLIN HFA) 108 (90 BASE) MCG/ACT inhaler Inhale 2 puffs into the lungs every 6 (six) hours as needed for wheezing. 02/27/12 02/26/13 Yes Corwin Levins, MD  ALPRAZolam Prudy Feeler) 0.5 MG tablet Take 1 tablet (0.5 mg total) by mouth 3 (three) times daily as needed. For anxiety/sleeplessness. 04/28/12  Yes Corwin Levins, MD  amiodarone (PACERONE) 200 MG tablet Take 1 tablet (200 mg total) by mouth 2 (two) times daily. 09/16/12  Yes Marianne L York, PA-C  arformoterol (BROVANA) 15 MCG/2ML NEBU Take 2 mLs (15 mcg total) by nebulization 2 (two) times daily. 09/16/12  Yes Tora Kindred York, PA-C  aspirin 81 MG chewable tablet Chew 81 mg by mouth daily.   Yes Historical Provider, MD  atorvastatin (LIPITOR) 40 MG tablet Take 40 mg by mouth at bedtime. 02/27/12  Yes Corwin Levins, MD  budesonide (PULMICORT) 0.25 MG/2ML nebulizer solution Take 4 mLs (0.5 mg total) by nebulization 2 (two) times daily. 09/16/12  Yes Marianne L York, PA-C  citalopram (CELEXA) 10 MG tablet Take 1 tablet (10 mg total) by mouth daily. 06/17/12 06/17/13 Yes Corwin Levins, MD  diltiazem (CARDIZEM CD) 180 MG 24 hr capsule Take 1 capsule (180 mg total) by mouth 2  (two) times daily. 09/16/12  Yes Marianne L York, PA-C  ezetimibe (ZETIA) 10 MG tablet Take 10 mg by mouth daily.   Yes Historical Provider, MD  feeding supplement (ENSURE COMPLETE) LIQD Take 237 mLs by mouth daily. 09/16/12  Yes Marianne L York, PA-C  fish oil-omega-3 fatty acids 1000 MG capsule Take 2 g by mouth daily.     Yes Historical Provider, MD  fluconazole (DIFLUCAN) 100 MG tablet Take 1 tablet (100 mg total) by mouth daily. 09/16/12  Yes Marianne L York, PA-C  furosemide (LASIX)  80 MG tablet Take 2 tablets (160 mg total) by mouth every 8 (eight) hours. 09/16/12  Yes Marianne L York, PA-C  guaiFENesin (MUCINEX) 600 MG 12 hr tablet Take 2 tablets (1,200 mg total) by mouth 2 (two) times daily. 09/16/12  Yes Tora Kindred York, PA-C  hydroxypropyl methylcellulose (ISOPTO TEARS) 2.5 % ophthalmic solution Place 2 drops into both eyes as needed. For dry/irritated eyes.   Yes Historical Provider, MD  potassium chloride SA (K-DUR,KLOR-CON) 20 MEQ tablet Take 1 tablet (20 mEq total) by mouth daily. 02/27/12  Yes Corwin Levins, MD  predniSONE (DELTASONE) 5 MG tablet Take 2 tablets (10 mg total) by mouth daily. 09/16/12  Yes Marianne L York, PA-C  senna-docusate (SENOKOT-S) 8.6-50 MG per tablet Take 3 tablets by mouth at bedtime. 09/16/12  Yes Marianne L York, PA-C  temazepam (RESTORIL) 30 MG capsule Take 1 capsule (30 mg total) by mouth at bedtime. 02/27/12 11/28/13 Yes Corwin Levins, MD  tiotropium (SPIRIVA) 18 MCG inhalation capsule Place 1 capsule (18 mcg total) into inhaler and inhale daily. 02/27/12 02/26/13 Yes Corwin Levins, MD  vitamin B-12 (CYANOCOBALAMIN) 100 MCG tablet Take 50 mcg by mouth daily.     Yes Historical Provider, MD  vitamin E 600 UNIT capsule Take 600 Units by mouth 2 (two) times daily.     Yes Historical Provider, MD  insulin aspart (NOVOLOG) 100 UNIT/ML injection CBG 70 - 120: 0 units      CBG 121 - 150: 1 unit      CBG 151 - 200: 2 units      CBG 201 - 250: 3 units      CBG 251 - 300: 5 units       CBG 301 - 350: 7 units      CBG 351 - 400 9 units 09/16/12   Clydia Llano, MD  insulin glargine (LANTUS) 100 UNIT/ML injection Inject 0.08 mLs (8 Units total) into the skin at bedtime. 09/16/12   Tora Kindred York, PA-C  Insulin Syringe-Needle U-100 (B-D INSULIN SYRINGE) 31G X 5/16" 0.3 ML MISC 1 Syringe by Does not apply route 4 (four) times daily. 09/16/12   Tora Kindred York, PA-C  levalbuterol (XOPENEX) 0.63 MG/3ML nebulizer solution Take 3 mLs (0.63 mg total) by nebulization every 3 (three) hours as needed for wheezing or shortness of breath. 09/16/12   Tora Kindred York, PA-C  magnesium oxide (DIASENSE MAGNESIUM) 400 (241.3 MG) MG tablet Take 1 tablet (400 mg total) by mouth daily. 02/27/12   Corwin Levins, MD  metoprolol succinate (TOPROL-XL) 25 MG 24 hr tablet Take 1 tablet (25 mg total) by mouth 2 (two) times daily with a meal. 09/16/12   Stephani Police, PA-C  Multiple Vitamins-Minerals (EYE VITAMINS) TABS Take 1 tablet by mouth 2 (two) times daily.      Historical Provider, MD  multivitamin [facility]) per tablet Take 1 tablet by mouth daily.      Historical Provider, MD  nitroGLYCERIN (NITROSTAT) 0.4 MG SL tablet Place 1 tablet (0.4 mg total) under the tongue every 5 (five) minutes as needed for chest pain. 08/14/12   Cassell Clement, MD   No Known Allergies  FAMILY HISTORY:  Family History  Problem Relation Age of Onset  . Heart disease Father   . Lung cancer Mother   . Cancer Other     lung cancer  . Heart disease Other   . Hypertension Other    SOCIAL HISTORY:  reports that he quit smoking  about 16 years ago. His smoking use included Cigarettes. He has a 75 pack-year smoking history. His smokeless tobacco use includes Chew. He reports that  drinks alcohol. He reports that he does not use illicit drugs.  REVIEW OF SYSTEMS:   Constitutional: Negative for fever, chills, weight loss, malaise/fatigue and diaphoresis.  HENT: Negative for hearing loss, ear pain, nosebleeds, congestion, sore  throat, neck pain, tinnitus and ear discharge.   Eyes: Negative for blurred vision, double vision, photophobia, pain, discharge and redness.  Respiratory: Negative for wheezing and stridor. Positive for hemoptysis, sputum production, shortness of breath,   Cardiovascular: Negative for chest pain, palpitations, orthopnea, claudication, leg swelling and PND.  Gastrointestinal: Negative for heartburn, nausea, vomiting, abdominal pain, diarrhea, constipation, blood in stool and melena.  Genitourinary: Negative for dysuria, urgency, frequency, hematuria and flank pain.  Musculoskeletal: Negative for myalgias, back pain, joint pain and falls.  Skin: Negative for itching and rash.  Neurological: Negative for dizziness, tingling, tremors, sensory change, speech change, focal weakness, seizures, loss of consciousness, weakness and headaches.  Endo/Heme/Allergies: Negative for environmental allergies and polydipsia. Does not bruise/bleed easily.  SUBJECTIVE:    Feels better on BiPap  VITAL SIGNS: Temp:  [97.5 F (36.4 C)-97.8 F (36.6 C)] 97.5 F (36.4 C) (07/07 1436) Pulse Rate:  [72-74] 72 (07/07 1436) Resp:  [22-24] 23 (07/07 1436) BP: (123-141)/(66-80) 141/77 mmHg (07/07 1436) SpO2:  [92 %-100 %] 92 % (07/07 1436) FiO2 (%):  [30 %] 30 % (07/07 1436)  PHYSICAL EXAMINATION: General:  Chronically ill-appearing elderly male, in NAD Neuro:  A&Ox3, follows commands HEENT:  No scleral icterus Neck:  No JVD, trachea midline, no carotid bruit Cardiovascular:  RRR, S1 and S2. +2 radial and DP pulses Lungs:  Scattered rhonchi in anterior lung fields Abdomen:  Round, soft, non-tender, active BS Musculoskeletal:  MAE well and equal Skin:  Skin tears on left hand, left knee. Intact otherwise   Recent Labs Lab 09/15/12 0455 10/20/2012 1211  NA 133* 131*  K 3.7 3.1*  CL 91* 85*  CO2 34* 33*  BUN 66* 86*  CREATININE 2.57* 2.75*  GLUCOSE 145* 194*    Recent Labs Lab 09/15/12 0455  09/16/12 0920 2012-10-20 1211  HGB 11.5* 11.4* 11.5*  HCT 34.4* 33.4* 34.1*  WBC 15.7* 18.2* 18.9*  PLT 303 266 242   Dg Chest Portable 1 View  20-Oct-2012   *RADIOLOGY REPORT*  Clinical Data: Respiratory distress.  PORTABLE CHEST - 1 VIEW  Comparison: 09/14/2012  Findings: Dual lead pacemaker is in place.  Left subclavian central line has its tip in the SVC.  Cardiac silhouette is enlarged. There is pleural fluid bilaterally with some atelectasis in the lower lungs.  Upper lungs are clear.  IMPRESSION: Persistent effusions and lower lobe atelectasis/infiltrate.   Original Report Authenticated By: Paulina Fusi, [name]    ASSESSMENT / PLAN:   Acute on Chronic Respiratory Failure multifactoral in nature, likely causes are progressive deconditioning, COPD (not currently in acute exacerbation: O2 and pred dep), heart failure,  Pneumonia, acute on chronic renal failure Appears to have FTT since discharge. He does have h/o esophageal stricture, so this also raises the concern for aspiration.  CXR unchanged from prior with LLL atelectasis vs effusion H/O OSA (nocturnal CPAP) P: - Continue Bipap PRN and HS - Can transition to nasal cannula for comfort - Continue antibiotics to cover ESBL, prefer Imipenem although I doubt that this is the cause of his worsening - Continue home BD regimen - Continue home  inhaled steroid regimen - Would give lasix to keep euvolemic - Likely does not need systemic corticosteroids would quickly discontinue after 24 hours - could consider dysphagia eval and esophogram to eval for reflux IF he improves from current status.  _ If he does improve, would push PT, definitely cannot go home  All other issues:  A on C renal failure, fluid and electrolyte disturbances, mild Anemia of chronic disease, Afib, GERD, DM w/ Hyperglycemia per Primary service.   Discussed in detail with wife & son [name] - DNR issued  Kathrin Greathouse S-ACNP   Care during the described time interval  was provided by me and/or other providers on the critical care team.  I have reviewed this patient's available data, including medical history, events of note, physical examination and test results as part of my evaluation  Oretha Milch  Pulmonary and Critical Care Medicine [facility] Pager: [phone]  10/10/2012, 3:21 PM

## 2012-09-21 NOTE — ED Notes (Signed)
Unable to give meds from new order due to pt. Already on way to 2600

## 2012-09-21 NOTE — H&P (Addendum)
PATIENT DETAILS Name: [name] FESTER Sr. Age: 79 y.o. Sex: male Date of Birth: 1932-10-09 Admit Date: 10/03/2012 [id number] John, MD   CHIEF COMPLAINT:  Worsening shortness of breath  HPI: [name] SITES Sr. is a 79 y.o. male recently discharged after a prolonged hospitalization, with a very complicated Past Medical History of severe COPD, recent ESBL pneumonia, hemoptysis, chronic diastolic heart failure, atrial fibrillation-currently not on anticoagulation who presents today with the above noted complaint. Patient was discharged from this hospital on 09/16/12-upon going home, he sustained a fall on the day of discharge, [name] had difficulty lifting him up back on bed. Family has noticed that he still is very short of breath and is still requiring oxygen to maintain his O2 saturation. Family's also noticed that he was severely weak and had trouble even getting up. Over the past few days, his shortness of breath has worsened, he has had no recurrence of his hemoptysis, but is bringing up yellowish sputum. His shortness of breath particularly worsened today, as a result EMS was called, and they found him to have O2 saturation in the 80s on his usual home O2, he was placed on CPAP and brought to the emergency room, where Bipap was applied. He was found to have worsening of his usual leukocytosis, chest x-ray showed persistent left lower lobe pneumonia, he appears very weak, I was subsequently asked to admit this patient for further evaluation and treatment. During my evaluation, patient was on BiPAP, he was awake but very lethargic. Family members including his spouse and son were at bedside. After long discussion with family, it was decided that we not escalate care beyond BiPAP, as as result the patient will be made a DO NOT RESUSCITATE. There is no history of hemoptysis No history of headache No history of chest pain No history of nausea vomiting and diarrhea  ALLERGIES:  No Known Allergies  PAST  MEDICAL HISTORY: Past Medical History  Diagnosis Date  . Stricture and stenosis of esophagus 12/07/2004    EGD done on 12/07/2004  . GERD (gastroesophageal reflux disease)   . Hypoxemia     With chronic respiratory failure  . Increased prostate specific antigen (PSA) velocity 02/26/2011  . DM (diabetes mellitus) 02/26/2011  . Gout 02/26/2011  . COPD (chronic obstructive pulmonary disease)     oxygen at home, 4L  . OSA (obstructive sleep apnea) 02/26/2011    CPAP  . Paroxysmal atrial fibrillation   . Depression   . Cardiac arrest 2009    while in hospital   . Hypertension   . HTN (hypertension) 02/26/2011  . Cardiac arrest 2009    while in hospital  . Lung cancer     lung s/p lobectomy x2 on RT lung  . Basal cell carcinoma of nose 02/26/2011  . ETOH abuse 01/29/2012    started drinking again - hx cardiac arrest in 2009 due to dt's with organ failure  . CKD (chronic kidney disease) 02/28/2012  . Chronic diastolic CHF (congestive heart failure)     a. EF 55-60% by echo 2013.  . Sick sinus syndrome     a. s/p [employer] pacemaker 2010.  . Bradycardia     a. Admitted 2010 with severe bradycardia, runs of paroxysmal VT.   [name] Stevenson Paroxysmal VT     a. In 2010 in setting of marked bradycardia.  [name] Stevenson CAD (coronary artery disease)     a. Nonobstructive CAD by cath 2006.  [name] Stevenson Esophageal ulcer     a. By  EGD 10/2011.  [name] Stevenson Hiatal hernia     a. By EGD 10/2011.  . Valvular heart disease     a. Mild MR/mild AI/mild AS by echo 05/2011.    PAST SURGICAL HISTORY: Past Surgical History  Procedure Laterality Date  . Lung lobectomy      x2 lobes on RT lung  . Knee arthroscopy      bilateral  . Pace maker    . Tonsillectomy    . Hernia repair      x2, esophageal  . Cardiac catheterization    . Esophagogastroduodenoscopy  10/25/2011    Procedure: ESOPHAGOGASTRODUODENOSCOPY (EGD);  Surgeon: Hart Carwin, MD;  Location: [facility];  Service: Endoscopy;  Laterality: N/A;  . Video bronchoscopy N/A  09/01/2012    Procedure: VIDEO BRONCHOSCOPY WITHOUT FLUORO;  Surgeon: Storm Frisk, MD;  Location: [facility] ENDOSCOPY;  Service: Cardiopulmonary;  Laterality: N/A;  . Video bronchoscopy Bilateral 09/11/2012    Procedure: VIDEO BRONCHOSCOPY WITHOUT FLUORO;  Surgeon: Leslye Peer, MD;  Location: [facility] END[facility]COPY;  Service: Cardiopulmonary;  Laterality: Bilateral;    MEDICATIONS AT HOME: Prior to Admission medications   Medication Sig Start Date End Date Taking? Authorizing Provider  albuterol (PROVENTIL HFA;VENTOLIN HFA) 108 (90 BASE) MCG/ACT inhaler Inhale 2 puffs into the lungs every 6 (six) hours as needed for wheezing. 02/27/12 02/26/13  Corwin Levins, MD  ALPRAZolam Prudy Feeler) 0.5 MG tablet Take 1 tablet (0.5 mg total) by mouth 3 (three) times daily as needed. For anxiety/sleeplessness. 04/28/12   Corwin Levins, MD  amiodarone (PACERONE) 200 MG tablet Take 1 tablet (200 mg total) by mouth 2 (two) times daily. 09/16/12   Tora Kindred York, PA-C  arformoterol (BROVANA) 15 MCG/2ML NEBU Take 2 mLs (15 mcg total) by nebulization 2 (two) times daily. 09/16/12   Tora Kindred York, PA-C  aspirin 81 MG tablet Take 81 mg by mouth daily.      Historical Provider, MD  atorvastatin (LIPITOR) 40 MG tablet Take 40 mg by mouth at bedtime. 02/27/12   Corwin Levins, MD  budesonide (PULMICORT) 0.25 MG/2ML nebulizer solution Take 4 mLs (0.5 mg total) by nebulization 2 (two) times daily. 09/16/12   Tora Kindred York, PA-C  citalopram (CELEXA) 10 MG tablet Take 1 tablet (10 mg total) by mouth daily. 06/17/12 06/17/13  Corwin Levins, MD  diltiazem (CARDIZEM CD) 180 MG 24 hr capsule Take 1 capsule (180 mg total) by mouth 2 (two) times daily. 09/16/12   Tora Kindred York, PA-C  ezetimibe (ZETIA) 10 MG tablet Take 10 mg by mouth daily.    Historical Provider, MD  feeding supplement (ENSURE COMPLETE) LIQD Take 237 mLs by mouth daily. 09/16/12   Tora Kindred York, PA-C  fish oil-omega-3 fatty acids 1000 MG capsule Take 2 g by mouth daily.      Historical  Provider, MD  fluconazole (DIFLUCAN) 100 MG tablet Take 1 tablet (100 mg total) by mouth daily. 09/16/12   Tora Kindred York, PA-C  furosemide (LASIX) 80 MG tablet Take 2 tablets (160 mg total) by mouth every 8 (eight) hours. 09/16/12   Tora Kindred York, PA-C  guaiFENesin (MUCINEX) 600 MG 12 hr tablet Take 2 tablets (1,200 mg total) by mouth 2 (two) times daily. 09/16/12   Stephani Police, PA-C  hydroxypropyl methylcellulose (ISOPTO TEARS) 2.5 % ophthalmic solution Place 2 drops into both eyes as needed. For dry/irritated eyes.    Historical Provider, MD  insulin aspart (NOVOLOG) 100 UNIT/ML injection CBG 70 -  120: 0 units      CBG 121 - 150: 1 unit      CBG 151 - 200: 2 units      CBG 201 - 250: 3 units      CBG 251 - 300: 5 units      CBG 301 - 350: 7 units      CBG 351 - 400 9 units 09/16/12   Clydia Llano, MD  insulin glargine (LANTUS) 100 UNIT/ML injection Inject 0.08 mLs (8 Units total) into the skin at bedtime. 09/16/12   Tora Kindred York, PA-C  Insulin Syringe-Needle U-100 (B-D INSULIN SYRINGE) 31G X 5/16" 0.3 ML MISC 1 Syringe by Does not apply route 4 (four) times daily. 09/16/12   Tora Kindred York, PA-C  levalbuterol (XOPENEX) 0.63 MG/3ML nebulizer solution Take 3 mLs (0.63 mg total) by nebulization every 3 (three) hours as needed for wheezing or shortness of breath. 09/16/12   Tora Kindred York, PA-C  levalbuterol (XOPENEX) 0.63 MG/3ML nebulizer solution Take 3 mLs (0.63 mg total) by nebulization every 4 (four) hours as needed for wheezing. 09/16/12   Tora Kindred York, PA-C  magnesium oxide (DIASENSE MAGNESIUM) 400 (241.3 MG) MG tablet Take 1 tablet (400 mg total) by mouth daily. 02/27/12   Corwin Levins, MD  metoprolol succinate (TOPROL-XL) 25 MG 24 hr tablet Take 1 tablet (25 mg total) by mouth 2 (two) times daily with a meal. 09/16/12   Stephani Police, PA-C  Multiple Vitamins-Minerals (EYE VITAMINS) TABS Take 1 tablet by mouth 2 (two) times daily.      Historical Provider, MD  multivitamin [facility]) per  tablet Take 1 tablet by mouth daily.      Historical Provider, MD  nitroGLYCERIN (NITROSTAT) 0.4 MG SL tablet Place 1 tablet (0.4 mg total) under the tongue every 5 (five) minutes as needed for chest pain. 08/14/12   Cassell Clement, MD  omeprazole (PRILOSEC) 20 MG capsule Take 1 tablet by mouth once daily. PHARMACY-PLEASE D/C NEXIUM RX...TOO EXPENSIVE FOR PATIENT. 04/23/12   Hart Carwin, MD  oxyCODONE (OXY IR/ROXICODONE) 5 MG immediate release tablet Take 1 tablet (5 mg total) by mouth every 4 (four) hours as needed. 09/16/12   Tora Kindred York, PA-C  potassium chloride SA (K-DUR,KLOR-CON) 20 MEQ tablet Take 1 tablet (20 mEq total) by mouth daily. 02/27/12   Corwin Levins, MD  predniSONE (DELTASONE) 5 MG tablet Take 2 tablets (10 mg total) by mouth daily. 09/16/12   Tora Kindred York, PA-C  senna-docusate (SENOKOT-S) 8.6-50 MG per tablet Take 3 tablets by mouth at bedtime. 09/16/12   Tora Kindred York, PA-C  temazepam (RESTORIL) 30 MG capsule Take 1 capsule (30 mg total) by mouth at bedtime. 02/27/12 11/28/13  Corwin Levins, MD  tiotropium (SPIRIVA) 18 MCG inhalation capsule Place 1 capsule (18 mcg total) into inhaler and inhale daily. 02/27/12 02/26/13  Corwin Levins, MD  vitamin B-12 (CYANOCOBALAMIN) 100 MCG tablet Take 50 mcg by mouth daily.      Historical Provider, MD  vitamin E 600 UNIT capsule Take 600 Units by mouth 2 (two) times daily.      Historical Provider, MD    FAMILY HISTORY: Family History  Problem Relation Age of Onset  . Heart disease Father   . Lung cancer Mother   . Cancer Other     lung cancer  . Heart disease Other   . Hypertension Other     SOCIAL HISTORY:  reports that he quit smoking about  16 years ago. His smoking use included Cigarettes. He has a 75 pack-year smoking history. His smokeless tobacco use includes Chew. He reports that  drinks alcohol. He reports that he does not use illicit drugs.  REVIEW OF SYSTEMS:  Constitutional:   No  weight loss, night sweats,  Fevers,  chills, fatigue.  HEENT:    No headaches, Difficulty swallowing,Tooth/dental problems,Sore throat,  No sneezing, itching, ear ache, nasal congestion, post nasal drip,   Cardio-vascular: No chest pain, swelling in lower extremities, anasarca,  dizziness, palpitations  GI:  No heartburn, indigestion, abdominal pain, nausea, vomiting, diarrhea, change in       bowel habits, loss of appetite  Resp:   No coughing up of blood.No chest wall deformity  Skin:  no rash or lesions.  GU:  no dysuria, change in color of urine, no urgency or frequency.  No flank pain.  Musculoskeletal: No joint pain or swelling.  No decreased range of motion.  No back pain.  Psych: No change in mood or affect. No depression or anxiety.  No memory loss.   PHYSICAL EXAM: Blood pressure 141/77, pulse 72, temperature 97.5 F (36.4 C), temperature source Axillary, resp. rate 23, SpO2 92.00%.  General appearance :Awake, alert but lethargic.on BiPAP. Follows commands  HEENT: Atraumatic and Normocephalic, pupils equally reactive to light and accomodation Neck: supple, no JVD. No cervical lymphadenopathy.  Chest:Good air entry bilaterally, bibasilar rales.  CVS: S1 S2 regular, no murmurs.  Abdomen: Bowel sounds present, Non tender and not distended with no gaurding, rigidity or rebound. Extremities: B/L Lower Ext shows no edema, both legs are warm to touch.Right Arm swollen than compared to last admission Neurology:  Non focal Skin:No Rash Wounds:N/A  LABS ON ADMISSION:   Recent Labs  10/06/2012 1211  NA 131*  K 3.1*  CL 85*  CO2 33*  GLUCOSE 194*  BUN 86*  CREATININE 2.75*  CALCIUM 8.6    Recent Labs  09/15/2012 1211  AST 31  ALT 37  ALKPHOS 110  BILITOT 0.5  PROT 5.9*  ALBUMIN 2.4*   No results found for this basename: LIPASE, AMYLASE,  in the last 72 hours  Recent Labs  10/02/2012 1211  WBC 18.9*  NEUTROABS 15.6*  HGB 11.5*  HCT 34.1*  MCV 94.7  PLT 242    Recent Labs   09/20/2012 1211  TROPONINI <0.30   No results found for this basename: DDIMER,  in the last 72 hours No components found with this basename: POCBNP,    RADIOLOGIC STUDIES ON ADMISSION: Dg Chest Portable 1 View  10/01/2012   *RADIOLOGY REPORT*  Clinical Data: Respiratory distress.  PORTABLE CHEST - 1 VIEW  Comparison: 09/14/2012  Findings: Dual lead pacemaker is in place.  Left subclavian central line has its tip in the SVC.  Cardiac silhouette is enlarged. There is pleural fluid bilaterally with some atelectasis in the lower lungs.  Upper lungs are clear.  IMPRESSION: Persistent effusions and lower lobe atelectasis/infiltrate.   Original Report Authenticated By: Paulina Fusi, [name]     EKG: Independently reviewed. Paced rhythm with a lot of artifacts.   ASSESSMENT AND PLAN: Present on Admission:  . Acute on chronic respiratory failure  -Multifactorial from combination of-HCAP, Severe COPD with exac, OSA, Acute Diastolic Heart Failure - Continue with BiPAP, start on IV Lasix, Solu-Medrol, scheduled nebulized bronchodilators and empiric antibiotics with vancomycin, Primaxin and ciprofloxacin. Hopefully with these measures, patient can be weaned off the BiPAP and placed back on usual regimen of oxygen. -  Will admit to a step down unit and monitor closely there. Have asked both pulmonology and cardiology to consult.  [name] Stevenson Possible HCAP - Continue with empiric vancomycin, Primaxin and ciprofloxacin-De- escalate antibiotics if clinically improved in the next few days - Obtain blood cultures   . COPD with exacerbation  - History of underlying severe COPD and on home oxygen  - Placed on steroids, scheduled nebulized bronchodilators. Antibiotics as indicated above   .? Acute on chronic diastolic heart failure  - On high doses of diuretics at home-160 mg 3 times a day  - BNP low, x-ray of the chest does not show pulmonary edema  - Mild failure at best, consulting cardiology, continue high-dose Lasix  with 100 mg TID - Monitor renal function closely   .Right arm swelling -?etiology -check doppler to make sure no DVT  . Atrial fibrillation - Paced rhythm-likely underlying A. fib  - Anticoagulation was held last admission in the setting of hemoptysis-no further hemoptysis since discharge  - Monitor in telemetry, continue Cardizem, metoprolol and amiodarone  - Have consulted cardiology   . OSA (obstructive sleep apnea) - Currently on BiPAP, once weaned off BiPAP and placed on CPAP   . HTN (hypertension) - Continue home meds   . GERD - Continue PPI   . DM (diabetes mellitus) type II controlled with renal manifestation - Place on sliding scale insulin, monitor CBGs closely.   . Recent prolonged hospitalization, deconditioned and failing to thrive-very poor prognosis. After discussion with family at bedside, patient now DO NOT RESUSCITATE. If no clinical improvement in the next few days, may need a palliative care evaluation for further clarification of goals of care.  Further plan will depend as patient's clinical course evolves and further radiologic and laboratory data become available. Patient will be monitored closely.  DVT Prophylaxis: Prophylactic heparin-as no hemoptysis since discharge. His monitor closely   Code Status: DNR  Total time spent for admission equals 45 minutes.  [facility] Pager [phone]  If 7PM-7AM, please contact night-coverage [email or web address] Password [facility] 10/07/2012, 2:43 PM

## 2012-09-21 NOTE — ED Notes (Signed)
Lactic acid results given to Dr. Beaton 

## 2012-09-21 NOTE — Progress Notes (Addendum)
ANTIBIOTIC CONSULT NOTE - INITIAL  Pharmacy Consult for Vancomycin, Cefepime Indication: rule out pneumonia  No Known Allergies  Patient Measurements: Weight = 87.1 kg  Vital Signs: Temp: 97.8 F (36.6 C) (07/07 1202) Temp src: Axillary (07/07 1202) BP: 138/80 mmHg (07/07 1240) Pulse Rate: 73 (07/07 1240)  Labs:  Recent Labs  30-Sep-2012 1211  WBC 18.9*  HGB 11.5*  PLT 242  CREATININE 2.75*   Microbiology: Recent Results (from the past 720 hour(s))  CULTURE, BLOOD (ROUTINE X 2)     Status: None   Collection Time    08/27/12  8:30 AM      Result Value Range Status   Specimen Description BLOOD LEFT HAND   Final   Special Requests BOTTLES DRAWN AEROBIC ONLY 5CC   Final   Culture  Setup Time 08/27/2012 14:16   Final   Culture NO GROWTH 5 DAYS   Final   Report Status 09/02/2012 FINAL   Final  MRSA PCR SCREENING     Status: Abnormal   Collection Time    08/27/12  8:38 AM      Result Value Range Status   MRSA by PCR POSITIVE (*) NEGATIVE Final   Comment:            The GeneXpert MRSA Assay (FDA     approved for NASAL specimens     only), is one component of a     comprehensive MRSA colonization     surveillance program. It is not     intended to diagnose MRSA     infection nor to guide or     monitor treatment for     MRSA infections.     RESULT CALLED TO, READ BACK BY AND VERIFIED WITH:     Esperanza Richters AT 1148 08/27/12 BY K BARR  CULTURE, BLOOD (ROUTINE X 2)     Status: None   Collection Time    08/27/12  8:40 AM      Result Value Range Status   Specimen Description BLOOD LEFT HAND   Final   Special Requests BOTTLES DRAWN AEROBIC ONLY 4CC   Final   Culture  Setup Time 08/27/2012 14:16   Final   Culture NO GROWTH 5 DAYS   Final   Report Status 09/02/2012 FINAL   Final  URINE CULTURE     Status: None   Collection Time    08/27/12  8:28 PM      Result Value Range Status   Specimen Description URINE, CLEAN CATCH   Final   Special Requests NONE   Final   Culture   Setup Time 08/28/2012 09:51   Final   Colony Count NO GROWTH   Final   Culture NO GROWTH   Final   Report Status 08/29/2012 FINAL   Final  CULTURE, EXPECTORATED SPUTUM-ASSESSMENT     Status: None   Collection Time    08/31/12  8:38 AM      Result Value Range Status   Specimen Description SPUTUM   Final   Special Requests NONE   Final   Sputum evaluation     Final   Value: MICROSCOPIC FINDINGS SUGGEST THAT THIS SPECIMEN IS NOT REPRESENTATIVE OF LOWER RESPIRATORY SECRETIONS. PLEASE RECOLLECT.     CALLED TO Mackie Pai 08/31/12 0913 BY K SCHULTZ   Report Status 08/31/2012 FINAL   Final  CULTURE, EXPECTORATED SPUTUM-ASSESSMENT     Status: None   Collection Time    08/31/12  1:10 PM  Result Value Range Status   Specimen Description SPUTUM   Final   Special Requests NONE   Final   Sputum evaluation     Final   Value: THIS SPECIMEN IS ACCEPTABLE. RESPIRATORY CULTURE REPORT TO FOLLOW.   Report Status 08/31/2012 FINAL   Final  CULTURE, RESPIRATORY (NON-EXPECTORATED)     Status: None   Collection Time    08/31/12  1:10 PM      Result Value Range Status   Specimen Description SPUTUM   Final   Special Requests NONE   Final   Gram Stain     Final   Value: RARE WBC PRESENT, PREDOMINANTLY PMN     RARE SQUAMOUS EPITHELIAL CELLS PRESENT     NO ORGANISMS SEEN   Culture NORMAL OROPHARYNGEAL FLORA   Final   Report Status 09/02/2012 FINAL   Final  AFB CULTURE WITH SMEAR     Status: None   Collection Time    09/01/12 11:19 AM      Result Value Range Status   Specimen Description BRONCHIAL WASHINGS   Final   Special Requests NONE   Final   ACID FAST SMEAR NO ACID FAST BACILLI SEEN   Final   Culture     Final   Value: CULTURE WILL BE EXAMINED FOR 6 WEEKS BEFORE ISSUING A FINAL REPORT   Report Status PENDING   Incomplete  FUNGUS CULTURE W SMEAR     Status: None   Collection Time    09/01/12 11:19 AM      Result Value Range Status   Specimen Description BRONCHIAL WASHINGS   Final    Special Requests NONE   Final   Fungal Smear FEW HYPHAL ELEMENTS   Final   Culture CANDIDA ALBICANS   Final   Report Status PENDING   Incomplete  LEGIONELLA CULTURE     Status: None   Collection Time    09/01/12 11:19 AM      Result Value Range Status   Specimen Description BRONCHIAL WASHINGS   Final   Special Requests NONE   Final   Culture NO LEGIONELLA ISOLATED   Final   Report Status 09/06/2012 FINAL   Final  CULTURE, RESPIRATORY (NON-EXPECTORATED)     Status: None   Collection Time    09/01/12 11:19 AM      Result Value Range Status   Specimen Description BRONCHIAL WASHINGS   Final   Special Requests NONE   Final   Gram Stain     Final   Value: RARE WBC PRESENT, PREDOMINANTLY PMN     RARE SQUAMOUS EPITHELIAL CELLS PRESENT     RARE YEAST     RARE GRAM NEGATIVE RODS   Culture     Final   Value: FEW ESCHERICHIA COLI     Note: Confirmed Extended Spectrum Beta-Lactamase Producer (ESBL) CRITICAL RESULT CALLED TO, READ BACK BY AND VERIFIED WITH: JENNIFER Z@7 :45AM ON 09/04/12 BY DANTS     FEW CANDIDA ALBICANS   Report Status 09/04/2012 FINAL   Final   Organism ID, Bacteria ESCHERICHIA COLI   Final  MRSA PCR SCREENING     Status: Abnormal   Collection Time    09/11/12  2:06 PM      Result Value Range Status   MRSA by PCR POSITIVE (*) NEGATIVE Final   Comment:            The GeneXpert MRSA Assay (FDA     approved for NASAL specimens     only), is  one component of a     comprehensive MRSA colonization     surveillance program. It is not     intended to diagnose MRSA     infection nor to guide or     monitor treatment for     MRSA infections.     RESULT CALLED TO, READ BACK BY AND VERIFIED WITH:     MILLER RN 15:40 09/11/12 ([email or web address])  CULTURE, BLOOD (ROUTINE X 2)     Status: None   Collection Time    09/13/12  2:11 PM      Result Value Range Status   Specimen Description BLOOD RIGHT ARM   Final   Special Requests BOTTLES DRAWN AEROBIC AND ANAEROBIC 10CC   Final   Culture   Setup Time 09/13/2012 19:02   Final   Culture NO GROWTH 5 DAYS   Final   Report Status 09/20/2012 FINAL   Final  CULTURE, BLOOD (ROUTINE X 2)     Status: None   Collection Time    09/13/12  2:11 PM      Result Value Range Status   Specimen Description BLOOD RIGHT HAND   Final   Special Requests BOTTLES DRAWN AEROBIC AND ANAEROBIC 10CC   Final   Culture  Setup Time 09/13/2012 19:02   Final   Culture NO GROWTH 5 DAYS   Final   Report Status 09/20/2012 FINAL   Final  CULTURE, EXPECTORATED SPUTUM-ASSESSMENT     Status: None   Collection Time    09/14/12  5:24 PM      Result Value Range Status   Specimen Description SPUTUM   Final   Special Requests Normal   Final   Sputum evaluation     Final   Value: THIS SPECIMEN IS ACCEPTABLE. RESPIRATORY CULTURE REPORT TO FOLLOW.   Report Status 09/14/2012 FINAL   Final  CULTURE, RESPIRATORY (NON-EXPECTORATED)     Status: None   Collection Time    09/14/12  5:24 PM      Result Value Range Status   Specimen Description SPUTUM   Final   Special Requests NONE   Final   Gram Stain     Final   Value: NO WBC SEEN     NO SQUAMOUS EPITHELIAL CELLS SEEN     NO ORGANISMS SEEN   Culture NORMAL OROPHARYNGEAL FLORA   Final   Report Status 09/17/2012 FINAL   Final    Medical History: Past Medical History  Diagnosis Date  . Stricture and stenosis of esophagus 12/07/2004    EGD done on 12/07/2004  . GERD (gastroesophageal reflux disease)   . Hypoxemia     With chronic respiratory failure  . Increased prostate specific antigen (PSA) velocity 02/26/2011  . DM (diabetes mellitus) 02/26/2011  . Gout 02/26/2011  . COPD (chronic obstructive pulmonary disease)     oxygen at home, 4L  . OSA (obstructive sleep apnea) 02/26/2011    CPAP  . Paroxysmal atrial fibrillation   . Depression   . Cardiac arrest 2009    while in hospital   . Hypertension   . HTN (hypertension) 02/26/2011  . Cardiac arrest 2009    while in hospital  . Lung cancer     lung s/p  lobectomy x2 on RT lung  . Basal cell carcinoma of nose 02/26/2011  . ETOH abuse 01/29/2012    started drinking again - hx cardiac arrest in 2009 due to dt's with organ failure  . CKD (chronic kidney disease) 02/28/2012  .  Chronic diastolic CHF (congestive heart failure)     a. EF 55-60% by echo 2013.  . Sick sinus syndrome     a. s/p Medtronic pacemaker 2010.  . Bradycardia     a. Admitted 2010 with severe bradycardia, runs of paroxysmal VT.   Marland Kitchen Paroxysmal VT     a. In 2010 in setting of marked bradycardia.  Marland Kitchen CAD (coronary artery disease)     a. Nonobstructive CAD by cath 2006.  Marland Kitchen Esophageal ulcer     a. By EGD 10/2011.  Marland Kitchen Hiatal hernia     a. By EGD 10/2011.  . Valvular heart disease     a. Mild MR/mild AI/mild AS by echo 05/2011.    Assessment: 79 year old recently discharged from hospital July 2 readmitted today with SOB.  Past medical history of chronic respiratory failure on 4 L of oxygen at home secondary to COPD, OSA.  Also with history of CHF, Afib, and CKD   Scr = 2.75, CrCl = 27 ml/min  Goal of Therapy:  Vancomycin trough level 15-20 mcg/ml Appropriate Cefepime dosing  Plan:  1) Vancomycin 1 Gram iv Q 24 hours 2) Cefepime 2 Gram iv Q 24 hours 3) Follow up Scr, culture, progress  Thank you. Okey Regal, PharmD [phone]  10/02/2012,1:31 PM

## 2012-09-21 NOTE — ED Provider Notes (Signed)
History    CSN: [id number] Arrival date & time 10/02/2012  1158  First MD Initiated Contact with Patient 10/11/2012 1200     Chief Complaint  Patient presents with  . Respiratory Distress   (Consider location/radiation/quality/duration/timing/severity/associated sxs/prior Treatment) HPI Comments: [name] ELTRINGHAM Sr. is a 79 y.o. male h/o CHF, COPD (home O2 2L [place]), who was recently admitted and DC from this hospital, here with an acute SOB.  Patient was found at home to be hypoxic to 88% on his home O2.  EMS placed him on CPAP and his O2 sats jumped up to 100%.  Patient denies any chest pain.  He states he has been short of breath for a few days, but it has acutely worsened this morning.  Patient states he has been compliant with a ll of his medications.  ROS is otherwise negative.  The history is provided by the patient and a relative.   Past Medical History  Diagnosis Date  . Stricture and stenosis of esophagus 12/07/2004    EGD done on 12/07/2004  . GERD (gastroesophageal reflux disease)   . Hypoxemia     With chronic respiratory failure  . Increased prostate specific antigen (PSA) velocity 02/26/2011  . DM (diabetes mellitus) 02/26/2011  . Gout 02/26/2011  . COPD (chronic obstructive pulmonary disease)     oxygen at home, 4L  . OSA (obstructive sleep apnea) 02/26/2011    CPAP  . Paroxysmal atrial fibrillation   . Depression   . Cardiac arrest 2009    while in hospital   . Hypertension   . HTN (hypertension) 02/26/2011  . Cardiac arrest 2009    while in hospital  . Lung cancer     lung s/p lobectomy x2 on RT lung  . Basal cell carcinoma of nose 02/26/2011  . ETOH abuse 01/29/2012    started drinking again - hx cardiac arrest in 2009 due to dt's with organ failure  . CKD (chronic kidney disease) 02/28/2012  . Chronic diastolic CHF (congestive heart failure)     a. EF 55-60% by echo 2013.  . Sick sinus syndrome     a. s/p Medtronic pacemaker 2010.  . Bradycardia     a.  Admitted 2010 with severe bradycardia, runs of paroxysmal VT.   Marland Kitchen Paroxysmal VT     a. In 2010 in setting of marked bradycardia.  Marland Kitchen CAD (coronary artery disease)     a. Nonobstructive CAD by cath 2006.  Marland Kitchen Esophageal ulcer     a. By EGD 10/2011.  Marland Kitchen Hiatal hernia     a. By EGD 10/2011.  . Valvular heart disease     a. Mild MR/mild AI/mild AS by echo 05/2011.   Past Surgical History  Procedure Laterality Date  . Lung lobectomy      x2 lobes on RT lung  . Knee arthroscopy      bilateral  . Pace maker    . Tonsillectomy    . Hernia repair      x2, esophageal  . Cardiac catheterization    . Esophagogastroduodenoscopy  10/25/2011    Procedure: ESOPHAGOGASTRODUODENOSCOPY (EGD);  Surgeon: Hart Carwin, MD;  Location: [facility];  Service: Endoscopy;  Laterality: N/A;  . Video bronchoscopy N/A 09/01/2012    Procedure: VIDEO BRONCHOSCOPY WITHOUT FLUORO;  Surgeon: Storm Frisk, MD;  Location: [facility] ENDOSCOPY;  Service: Cardiopulmonary;  Laterality: N/A;  . Video bronchoscopy Bilateral 09/11/2012    Procedure: VIDEO BRONCHOSCOPY WITHOUT FLUORO;  Surgeon: Les Pou  [name], MD;  Location: [facility] ENDOSCOPY;  Service: Cardiopulmonary;  Laterality: Bilateral;   Family History  Problem Relation Age of Onset  . Heart disease Father   . Lung cancer Mother   . Cancer Other     lung cancer  . Heart disease Other   . Hypertension Other    History  Substance Use Topics  . Smoking status: Former Smoker -- 1.50 packs/day for 50 years    Types: Cigarettes    Quit date: 06/28/1996  . Smokeless tobacco: Current User    Types: Chew  . Alcohol Use: Yes     Comment: 2 beers 2-3 times /week    Review of Systems 10 Systems reviewed and are negative for acute change except as noted in the HPI.   Allergies  Review of patient's allergies indicates no known allergies.  Home Medications   Current Outpatient Rx  Name  Route  Sig  Dispense  Refill  . albuterol (PROVENTIL HFA;VENTOLIN HFA) 108 (90 BASE)  MCG/ACT inhaler   Inhalation   Inhale 2 puffs into the lungs every 6 (six) hours as needed for wheezing.   1 Inhaler   6   . ALPRAZolam (XANAX) 0.5 MG tablet   Oral   Take 1 tablet (0.5 mg total) by mouth 3 (three) times daily as needed. For anxiety/sleeplessness.   90 tablet   3   . amiodarone (PACERONE) 200 MG tablet   Oral   Take 1 tablet (200 mg total) by mouth 2 (two) times daily.   60 tablet   3   . arformoterol (BROVANA) 15 MCG/2ML NEBU   Nebulization   Take 2 mLs (15 mcg total) by nebulization 2 (two) times daily.   120 mL   3   . aspirin 81 MG tablet   Oral   Take 81 mg by mouth daily.           Marland Kitchen atorvastatin (LIPITOR) 40 MG tablet   Oral   Take 40 mg by mouth at bedtime.         . budesonide (PULMICORT) 0.25 MG/2ML nebulizer solution   Nebulization   Take 4 mLs (0.5 mg total) by nebulization 2 (two) times daily.   60 mL   12   . citalopram (CELEXA) 10 MG tablet   Oral   Take 1 tablet (10 mg total) by mouth daily.   90 tablet   3   . diltiazem (CARDIZEM CD) 180 MG 24 hr capsule   Oral   Take 1 capsule (180 mg total) by mouth 2 (two) times daily.   60 capsule   3   . ezetimibe (ZETIA) 10 MG tablet   Oral   Take 10 mg by mouth daily.         . feeding supplement (ENSURE COMPLETE) LIQD   Oral   Take 237 mLs by mouth daily.   60 Bottle   6   . fish oil-omega-3 fatty acids 1000 MG capsule   Oral   Take 2 g by mouth daily.           . fluconazole (DIFLUCAN) 100 MG tablet   Oral   Take 1 tablet (100 mg total) by mouth daily.   5 tablet   0   . furosemide (LASIX) 80 MG tablet   Oral   Take 2 tablets (160 mg total) by mouth every 8 (eight) hours.   120 tablet   1   . guaiFENesin (MUCINEX) 600 MG 12 hr tablet  Oral   Take 2 tablets (1,200 mg total) by mouth 2 (two) times daily.         . hydroxypropyl methylcellulose (ISOPTO TEARS) 2.5 % ophthalmic solution   Both Eyes   Place 2 drops into both eyes as needed. For  dry/irritated eyes.         . insulin aspart (NOVOLOG) 100 UNIT/ML injection      CBG 70 - 120: 0 units      CBG 121 - 150: 1 unit      CBG 151 - 200: 2 units      CBG 201 - 250: 3 units      CBG 251 - 300: 5 units      CBG 301 - 350: 7 units      CBG 351 - 400 9 units   1 vial   12   . insulin glargine (LANTUS) 100 UNIT/ML injection   Subcutaneous   Inject 0.08 mLs (8 Units total) into the skin at bedtime.   10 mL   12   . Insulin Syringe-Needle U-100 (B-D INSULIN SYRINGE) 31G X 5/16" 0.3 ML MISC   Does not apply   1 Syringe by Does not apply route 4 (four) times daily.   100 each   5   . levalbuterol (XOPENEX) 0.63 MG/3ML nebulizer solution   Nebulization   Take 3 mLs (0.63 mg total) by nebulization every 3 (three) hours as needed for wheezing or shortness of breath.   3 mL   12   . levalbuterol (XOPENEX) 0.63 MG/3ML nebulizer solution   Nebulization   Take 3 mLs (0.63 mg total) by nebulization every 4 (four) hours as needed for wheezing.   3 mL   12     Please give the patient the individual nebulizer a ...   . magnesium oxide (DIASENSE MAGNESIUM) 400 (241.3 MG) MG tablet   Oral   Take 1 tablet (400 mg total) by mouth daily.   90 tablet   3   . metoprolol succinate (TOPROL-XL) 25 MG 24 hr tablet   Oral   Take 1 tablet (25 mg total) by mouth 2 (two) times daily with a meal.   60 tablet   3   . Multiple Vitamins-Minerals (EYE VITAMINS) TABS   Oral   Take 1 tablet by mouth 2 (two) times daily.           . multivitamin (THERAGRAN) per tablet   Oral   Take 1 tablet by mouth daily.           . nitroGLYCERIN (NITROSTAT) 0.4 MG SL tablet   Sublingual   Place 1 tablet (0.4 mg total) under the tongue every 5 (five) minutes as needed for chest pain.   25 tablet   prn   . omeprazole (PRILOSEC) 20 MG capsule      Take 1 tablet by mouth once daily. PHARMACY-PLEASE D/C NEXIUM RX...TOO EXPENSIVE FOR PATIENT.   90 capsule   1   . oxyCODONE (OXY  IR/ROXICODONE) 5 MG immediate release tablet   Oral   Take 1 tablet (5 mg total) by mouth every 4 (four) hours as needed.   30 tablet   0   . potassium chloride SA (K-DUR,KLOR-CON) 20 MEQ tablet   Oral   Take 1 tablet (20 mEq total) by mouth daily.   90 tablet   3   . predniSONE (DELTASONE) 5 MG tablet   Oral   Take 2 tablets (10 mg total)  by mouth daily.   60 tablet   0   . senna-docusate (SENOKOT-S) 8.6-50 MG per tablet   Oral   Take 3 tablets by mouth at bedtime.         . temazepam (RESTORIL) 30 MG capsule   Oral   Take 1 capsule (30 mg total) by mouth at bedtime.   90 capsule   1   . tiotropium (SPIRIVA) 18 MCG inhalation capsule   Inhalation   Place 1 capsule (18 mcg total) into inhaler and inhale daily.   90 capsule   3   . vitamin B-12 (CYANOCOBALAMIN) 100 MCG tablet   Oral   Take 50 mcg by mouth daily.           . vitamin E 600 UNIT capsule   Oral   Take 600 Units by mouth 2 (two) times daily.            BP 138/80  Pulse 73  Temp(Src) 97.8 F (36.6 C) (Axillary)  Resp 23  SpO2 100% Physical Exam  Nursing note and vitals reviewed. Constitutional: Vital signs are normal. He appears well-developed and well-nourished.  Non-toxic appearance. He does not appear ill. He appears distressed.  HENT:  Head: Normocephalic and atraumatic.  Nose: Nose normal.  Eyes: Conjunctivae and EOM are normal. Pupils are equal, round, and reactive to light. No scleral icterus.  Neck: Normal range of motion. Neck supple. No tracheal deviation, no edema, no erythema and normal range of motion present. No mass and no thyromegaly present.  Cardiovascular: Normal rate, regular rhythm, S1 normal, S2 normal, normal heart sounds, intact distal pulses and normal pulses.  Exam reveals no gallop and no friction rub.   No murmur heard. Pulses:      Radial pulses are 2+ on the right side, and 2+ on the left side.       Dorsalis pedis pulses are 2+ on the right side, and 2+ on the  left side.  Pulmonary/Chest: He is in respiratory distress. He has wheezes. He has no rhonchi. He has rales. He exhibits no tenderness.  Abdominal: Soft. Normal appearance and bowel sounds are normal. He exhibits no distension, no ascites and no mass. There is no hepatosplenomegaly. There is no tenderness. There is no rebound, no guarding and no CVA tenderness.  Musculoskeletal: Normal range of motion. He exhibits no edema and no tenderness.  Lymphadenopathy:    He has no cervical adenopathy.  Neurological: He has normal strength. No sensory deficit. GCS eye subscore is 4. GCS verbal subscore is 5. GCS motor subscore is 6.  Skin: Skin is warm and intact. No petechiae and no rash noted. He is diaphoretic. No erythema. No pallor.    ED Course  Procedures (including critical care time) Labs Reviewed  CBC WITH DIFFERENTIAL - Abnormal; Notable for the following:    WBC 18.9 (*)    RBC 3.60 (*)    Hemoglobin 11.5 (*)    HCT 34.1 (*)    RDW 17.6 (*)    Neutrophils Relative % 83 (*)    Neutro Abs 15.6 (*)    Lymphocytes Relative 10 (*)    Monocytes Absolute 1.3 (*)    All other components within normal limits  COMPREHENSIVE METABOLIC PANEL - Abnormal; Notable for the following:    Sodium 131 (*)    Potassium 3.1 (*)    Chloride 85 (*)    CO2 33 (*)    Glucose, Bld 194 (*)    BUN 86 (*)  Creatinine, Ser 2.75 (*)    Total Protein 5.9 (*)    Albumin 2.4 (*)    GFR calc non Af Amer 21 (*)    GFR calc Af Amer 24 (*)    All other components within normal limits  PRO B NATRIURETIC PEPTIDE - Abnormal; Notable for the following:    Pro B Natriuretic peptide (BNP) 806.5 (*)    All other components within normal limits  POCT I-STAT 3, BLOOD GAS (G3+) - Abnormal; Notable for the following:    pH, Arterial 7.475 (*)    pO2, Arterial 113.0 (*)    Bicarbonate 31.1 (*)    Acid-Base Excess 7.0 (*)    All other components within normal limits  CULTURE, BLOOD (ROUTINE X 2)  CULTURE, BLOOD  (ROUTINE X 2)  TROPONIN I  BLOOD GAS, ARTERIAL  PROCALCITONIN   Dg Chest Portable 1 View  09/28/2012   *RADIOLOGY REPORT*  Clinical Data: Respiratory distress.  PORTABLE CHEST - 1 VIEW  Comparison: 09/14/2012  Findings: Dual lead pacemaker is in place.  Left subclavian central line has its tip in the SVC.  Cardiac silhouette is enlarged. There is pleural fluid bilaterally with some atelectasis in the lower lungs.  Upper lungs are clear.  IMPRESSION: Persistent effusions and lower lobe atelectasis/infiltrate.   Original Report Authenticated By: Paulina Fusi, [name]   No diagnosis found.  Date: 09/17/2012  Rate: 61  Rhythm: normal sinus rhythm  QRS Axis: indeterminate  Intervals: normal  ST/T Wave abnormalities: nonspecific ST changes  Conduction Disutrbances:none  Narrative Interpretation: wide QRS, ventricularly paced  Old EKG Reviewed: unchanged   MDM  Patient presented to the ED in critical condition. He was immediately place on bipap, received neb treatments, steroids, magnesium for treatment.  CXR as above revealed an infiltrate, WBC 19, consistent with pneumonia causing his COPD exacerbation.  Since he was recently in the hospital, he will be treated for HCAP with vanc and cefepime.  Family made aware and are amendable to the plan.  Will continue Bipap until he transitions to step down unit.   CRITICAL CARE Performed by: Sherylann Vangorden   Total critical care time: .  Critical care time was exclusive of separately billable procedures and treating other patients.  Critical care was necessary to treat or prevent imminent or life-threatening deterioration.  Critical care was time spent personally by me on the following activities: development of treatment plan with patient and/or surrogate as well as nursing, discussions with consultants, evaluation of patient's response to treatment, examination of patient, obtaining history from patient or surrogate, ordering and performing  treatments and interventions, ordering and review of laboratory studies, ordering and review of radiographic studies, pulse oximetry and re-evaluation of patient's condition.   Tomasita Crumble, MD 10/13/2012 1343  Tomasita Crumble, MD 09/25/2012 1610  Tomasita Crumble, MD 10/02/12 1423

## 2012-09-22 DIAGNOSIS — E8809 Other disorders of plasma-protein metabolism, not elsewhere classified: Secondary | ICD-10-CM | POA: Diagnosis present

## 2012-09-22 DIAGNOSIS — E871 Hypo-osmolality and hyponatremia: Secondary | ICD-10-CM

## 2012-09-22 DIAGNOSIS — R0902 Hypoxemia: Secondary | ICD-10-CM | POA: Diagnosis present

## 2012-09-22 DIAGNOSIS — I82629 Acute embolism and thrombosis of deep veins of unspecified upper extremity: Secondary | ICD-10-CM

## 2012-09-22 LAB — GLUCOSE, CAPILLARY: Glucose-Capillary: 212 mg/dL — ABNORMAL HIGH (ref 70–99)

## 2012-09-22 LAB — URINE CULTURE: Culture: NO GROWTH

## 2012-09-22 LAB — COMPREHENSIVE METABOLIC PANEL
ALT: 32 U/L (ref 0–53)
AST: 23 U/L (ref 0–37)
Albumin: 2.2 g/dL — ABNORMAL LOW (ref 3.5–5.2)
Calcium: 9 mg/dL (ref 8.4–10.5)
Creatinine, Ser: 3.07 mg/dL — ABNORMAL HIGH (ref 0.50–1.35)
Sodium: 131 mEq/L — ABNORMAL LOW (ref 135–145)
Total Protein: 5.7 g/dL — ABNORMAL LOW (ref 6.0–8.3)

## 2012-09-22 LAB — CBC
HCT: 30 % — ABNORMAL LOW (ref 39.0–52.0)
Hemoglobin: 10.1 g/dL — ABNORMAL LOW (ref 13.0–17.0)
MCH: 31.5 pg (ref 26.0–34.0)
MCV: 93.5 fL (ref 78.0–100.0)
RBC: 3.21 MIL/uL — ABNORMAL LOW (ref 4.22–5.81)

## 2012-09-22 MED ORDER — SODIUM CHLORIDE 0.9 % IV SOLN: 250.0000 mL | INTRAVENOUS | Status: DC | PRN

## 2012-09-22 MED ORDER — IPRATROPIUM BROMIDE 0.02 % IN SOLN
0.5000 mg | Freq: Four times a day (QID) | RESPIRATORY_TRACT | Status: DC | PRN
  Administered 2012-09-23 – 2012-09-24 (×3): 0.5 mg via RESPIRATORY_TRACT
  Filled 2012-09-22 (×3): qty 2.5

## 2012-09-22 MED ORDER — GLUCERNA SHAKE PO LIQD
237.0000 mL | Freq: Two times a day (BID) | ORAL | Status: DC
  Administered 2012-09-23 – 2012-09-27 (×8): 237 mL via ORAL

## 2012-09-22 MED ORDER — METHYLPREDNISOLONE SODIUM SUCC 125 MG IJ SOLR
60.0000 mg | Freq: Three times a day (TID) | INTRAMUSCULAR | Status: DC
  Administered 2012-09-22 – 2012-09-23 (×4): 60 mg via INTRAVENOUS
  Filled 2012-09-22 (×5): qty 0.96

## 2012-09-22 MED ORDER — ALBUTEROL SULFATE (5 MG/ML) 0.5% IN NEBU
2.5000 mg | INHALATION_SOLUTION | Freq: Four times a day (QID) | RESPIRATORY_TRACT | Status: DC | PRN
  Administered 2012-09-22 – 2012-09-24 (×4): 2.5 mg via RESPIRATORY_TRACT
  Filled 2012-09-22 (×4): qty 0.5

## 2012-09-22 MED ORDER — MUPIROCIN CALCIUM 2 % EX CREA
TOPICAL_CREAM | Freq: Two times a day (BID) | CUTANEOUS | Status: DC
  Administered 2012-09-22: 1 via TOPICAL
  Administered 2012-09-23 – 2012-09-27 (×5): via TOPICAL
  Filled 2012-09-22 (×2): qty 15

## 2012-09-22 MED ORDER — HEPARIN (PORCINE) IN NACL 100-0.45 UNIT/ML-% IJ SOLN
800.0000 [IU]/h | INTRAMUSCULAR | Status: DC
  Administered 2012-09-22: 1000 [IU]/h via INTRAVENOUS
  Administered 2012-09-23: 900 [IU]/h via INTRAVENOUS
  Administered 2012-09-23: 800 [IU]/h via INTRAVENOUS
  Filled 2012-09-22 (×4): qty 250

## 2012-09-22 NOTE — Progress Notes (Signed)
ANTICOAGULATION CONSULT NOTE - Initial Consult  Pharmacy Consult for heparin Indication: DVT  No Known Allergies  Patient Measurements: Height: 5' 6.5" (168.9 cm) Weight: 192 lb 10.9 oz (87.4 kg) IBW/kg (Calculated) : 64.95 Heparin Dosing Weight: 83kg  Vital Signs: Temp: 98.5 F (36.9 C) (07/08 1454) Temp src: Oral (07/08 1454) BP: 126/66 mmHg (07/08 1454) Pulse Rate: 69 (07/08 1454)  Labs:  Recent Labs  09/26/2012 1211 09/16/2012 1932 09/24/2012 2219 09/22/12 0419 09/22/12 0500  HGB 11.5*  --   --   --  10.1*  HCT 34.1*  --   --   --  30.0*  PLT 242  --   --   --  241  CREATININE 2.75*  --   --   --  3.07*  TROPONINI <0.30 <0.30 <0.30 <0.30  --     Estimated Creatinine Clearance: 21.1 ml/min (by C-G formula based on Cr of 3.07).   Medical History: Past Medical History  Diagnosis Date  . Stricture and stenosis of esophagus 12/07/2004    EGD done on 12/07/2004  . GERD (gastroesophageal reflux disease)   . Hypoxemia     With chronic respiratory failure  . Increased prostate specific antigen (PSA) velocity 02/26/2011  . DM (diabetes mellitus) 02/26/2011  . Gout 02/26/2011  . COPD (chronic obstructive pulmonary disease)     a. oxygen at home, 4L. b. On prednisone.  . OSA (obstructive sleep apnea) 02/26/2011    CPAP  . Paroxysmal atrial fibrillation   . Depression   . Cardiac arrest 2009    while in hospital   . Hypertension   . HTN (hypertension) 02/26/2011  . Cardiac arrest 2009    while in hospital  . Non-small cell lung cancer     a. s/p RUL lobectomy, adjuvant chemo 2006. b. New lung nodule seen 08/2012, OP f/u recommended.  . Basal cell carcinoma of nose 02/26/2011  . ETOH abuse 01/29/2012    started drinking again - hx cardiac arrest in 2009 due to dt's with organ failure  . CKD (chronic kidney disease) 02/28/2012  . Chronic diastolic CHF (congestive heart failure)     a. EF 65-70% 08/2012. b. Complex picture with ascites/anasarca/low albumin/CKD.  .  Sick sinus syndrome     a. s/p [employer] pacemaker 2010.  . Bradycardia     a. Admitted 2010 with severe bradycardia, runs of paroxysmal VT.   Marland Kitchen Paroxysmal VT     a. In 2010 in setting of marked bradycardia.  Marland Kitchen CAD (coronary artery disease)     a. Nonobstructive CAD by cath 2006.  Marland Kitchen Esophageal ulcer     a. By EGD 10/2011.  Marland Kitchen Hiatal hernia     a. By EGD 10/2011.  . Valvular heart disease     a. Mild MR/mild AI/mild AS by echo 05/2011.  Marland Kitchen Hemoptysis     a. 08/2012:  Bronchoscopy 09/01/12 did not determine a source - bronchial washings did not show malignant cells on path. ENT did not find any pathology in the upper most respiratory/gi tract by fiberoptic laryngoscopy. A second bronchoscopy was done on 09/11/12 - this time a lesion along the RML suture line from previous lobectomy was found, but without evidence for malignancy on biopsy.   . Pneumonia     a. Hosp 08/2012 with + ESBL E coli, candida albicans on BAL tx with levaquin, primaxin, diflucan - complex hospitalization with CKD, anasarca, CHF, afib, hemoptysis.  . Fungal infection     a. Rash 08/2012 -  felt to be disseminated fungal infection, also treated with Valtrex for possible buttocks shingles.  . Hypoalbuminemia     a. 08/2012.    Medications:  Prescriptions prior to admission  Medication Sig Dispense Refill  . albuterol (PROVENTIL HFA;VENTOLIN HFA) 108 (90 BASE) MCG/ACT inhaler Inhale 2 puffs into the lungs every 6 (six) hours as needed for wheezing.  1 Inhaler  6  . ALPRAZolam (XANAX) 0.5 MG tablet Take 1 tablet (0.5 mg total) by mouth 3 (three) times daily as needed. For anxiety/sleeplessness.  90 tablet  3  . amiodarone (PACERONE) 200 MG tablet Take 1 tablet (200 mg total) by mouth 2 (two) times daily.  60 tablet  3  . arformoterol (BROVANA) 15 MCG/2ML NEBU Take 2 mLs (15 mcg total) by nebulization 2 (two) times daily.  120 mL  3  . aspirin 81 MG chewable tablet Chew 81 mg by mouth daily.      Marland Kitchen atorvastatin (LIPITOR) 40 MG  tablet Take 40 mg by mouth at bedtime.      . budesonide (PULMICORT) 0.25 MG/2ML nebulizer solution Take 4 mLs (0.5 mg total) by nebulization 2 (two) times daily.  60 mL  12  . citalopram (CELEXA) 10 MG tablet Take 1 tablet (10 mg total) by mouth daily.  90 tablet  3  . diltiazem (CARDIZEM CD) 180 MG 24 hr capsule Take 1 capsule (180 mg total) by mouth 2 (two) times daily.  60 capsule  3  . ezetimibe (ZETIA) 10 MG tablet Take 10 mg by mouth daily.      . feeding supplement (ENSURE COMPLETE) LIQD Take 237 mLs by mouth daily.  60 Bottle  6  . fish oil-omega-3 fatty acids 1000 MG capsule Take 2 g by mouth daily.        . fluconazole (DIFLUCAN) 100 MG tablet Take 1 tablet (100 mg total) by mouth daily.  5 tablet  0  . furosemide (LASIX) 80 MG tablet Take 2 tablets (160 mg total) by mouth every 8 (eight) hours.  120 tablet  1  . guaiFENesin (MUCINEX) 600 MG 12 hr tablet Take 2 tablets (1,200 mg total) by mouth 2 (two) times daily.      . hydroxypropyl methylcellulose (ISOPTO TEARS) 2.5 % ophthalmic solution Place 2 drops into both eyes as needed. For dry/irritated eyes.      . insulin aspart (NOVOLOG) 100 UNIT/ML injection Inject 0-9 Units into the skin 3 (three) times daily with meals. 70-120=0units; 121-150=1units; 151-200=2 units; 201-250=3 units; 251-300=5 units; 301-350= 7 units; 351-400= 9 units      . insulin glargine (LANTUS) 100 UNIT/ML injection Inject 0.08 mLs (8 Units total) into the skin at bedtime.  10 mL  12  . levalbuterol (XOPENEX) 0.63 MG/3ML nebulizer solution Take 3 mLs (0.63 mg total) by nebulization every 3 (three) hours as needed for wheezing or shortness of breath.  3 mL  12  . magnesium oxide (DIASENSE MAGNESIUM) 400 (241.3 MG) MG tablet Take 1 tablet (400 mg total) by mouth daily.  90 tablet  3  . metoprolol succinate (TOPROL-XL) 25 MG 24 hr tablet Take 1 tablet (25 mg total) by mouth 2 (two) times daily with a meal.  60 tablet  3  . Multiple Vitamins-Minerals (EYE VITAMINS) TABS  Take 1 tablet by mouth 2 (two) times daily.        . multivitamin (THERAGRAN) per tablet Take 1 tablet by mouth daily.        . nitroGLYCERIN (NITROSTAT) 0.4 MG  SL tablet Place 1 tablet (0.4 mg total) under the tongue every 5 (five) minutes as needed for chest pain.  25 tablet  prn  . omeprazole (PRILOSEC) 20 MG capsule Take 20 mg by mouth daily.      Marland Kitchen oxycodone (OXY-IR) 5 MG capsule Take 5 mg by mouth every 4 (four) hours as needed for pain.      . potassium chloride SA (K-DUR,KLOR-CON) 20 MEQ tablet Take 1 tablet (20 mEq total) by mouth daily.  90 tablet  3  . predniSONE (DELTASONE) 5 MG tablet Take 2 tablets (10 mg total) by mouth daily.  60 tablet  0  . senna-docusate (SENOKOT-S) 8.6-50 MG per tablet Take 3 tablets by mouth at bedtime.      . temazepam (RESTORIL) 30 MG capsule Take 1 capsule (30 mg total) by mouth at bedtime.  90 capsule  1  . tiotropium (SPIRIVA) 18 MCG inhalation capsule Place 1 capsule (18 mcg total) into inhaler and inhale daily.  90 capsule  3  . vitamin B-12 (CYANOCOBALAMIN) 100 MCG tablet Take 50 mcg by mouth daily.        . vitamin E 600 UNIT capsule Take 600 Units by mouth 2 (two) times daily.        . Insulin Syringe-Needle U-100 (B-D INSULIN SYRINGE) 31G X 5/16" 0.3 ML MISC 1 Syringe by Does not apply route 4 (four) times daily.  100 each  5   Scheduled:  . albuterol  2.5 mg Nebulization Q6H WA  . amiodarone  200 mg Oral BID  . aspirin EC  81 mg Oral Daily  . atorvastatin  40 mg Oral QHS  . Chlorhexidine Gluconate Cloth  6 each Topical Q0600  . citalopram  10 mg Oral Daily  . diltiazem  180 mg Oral BID  . docusate sodium  100 mg Oral BID  . ezetimibe  10 mg Oral Daily  . feeding supplement  237 mL Oral Q24H  . fluconazole  100 mg Oral Daily  . guaiFENesin  1,200 mg Oral BID  . heparin  5,000 Units Subcutaneous Q8H  . insulin aspart  0-9 Units Subcutaneous TID WC  . insulin glargine  3 Units Subcutaneous QHS  . ipratropium  0.5 mg Nebulization Q6H  .  magnesium oxide  400 mg Oral Daily  . methylPREDNISolone (SOLU-MEDROL) injection  60 mg Intravenous TID  . metoprolol succinate  25 mg Oral BID WC  . mineral oil  1 enema Rectal Daily  . multivitamin with minerals  1 tablet Oral Daily  . mupirocin cream   Topical BID  . mupirocin ointment  1 application Nasal BID  . omega-3 acid ethyl esters  2 g Oral Daily  . pantoprazole  40 mg Oral Daily  . potassium chloride SA  20 mEq Oral Daily  . senna  1 tablet Oral BID  . sodium chloride  3 mL Intravenous Q12H  . sodium chloride  3 mL Intravenous Q12H  . vitamin B-12  50 mcg Oral Daily  . vitamin E  400 Units Oral BID    Assessment: 78 yo male with RUE DVT to start heparin.  Pharmacy has been asked to dose heparin. Patient is noted on sq heparin with last dose at 1:30pm today. Hg/Hct= 10.1/30 and pltc= 241. Patient was on heparin followed by Xarelto last admission patient developed hemoptysis (noted on 6/14).  The patient was last on a heparin infusion on 09/16/12 at 1150 units/hr (decreased from 1350 units/hr due to a  heparin level of 0.83)  . At that time no hemoptysis was noted.   Goal of Therapy:  Heparin level= 0.3-0.5 Monitor platelets by anticoagulation protocol: Yes   Plan:  -No heparin bolus dose to recent sq heparin -Begin heparin infusion at 1000 units/hr -Heparin level in 8hrs and daily with CBC daily  Harland German, Pharm D 09/22/2012 4:01 PM

## 2012-09-22 NOTE — Progress Notes (Signed)
Report called to [place], 4700 RN. Pt to transfer to 4738 via bed, mattress overlay ordered. bactroban applied to left hand. VS stable at time of transfer. Wife and belongings at bedside. Meds in chart.  Doppler study is being performed in room now. Will tx pt after study completed. Foley removed per order, condom cath placed. No current questions or complaints at this time.  Chen Saadeh L

## 2012-09-22 NOTE — Progress Notes (Signed)
Chaplain visited pt in response to a spiritual care consult request. Pt was awake, alert and responsive. Pt's son was at the bedside during chaplain's visit and states that "my father's health has improved quickly and expressed optimism about the future. Pt also continue to benefit strong support from family and his community of faith. Chaplain shared words of encouragement, comfort and provided ministry of empathic listening. Chaplain will follow up at a later time. Pt and son expressed their appreciation for the uplifting words shared by the chaplain. Kelle Darting [phone]  09/22/12 1609  Clinical Encounter Type  Visited With Patient and family together  Visit Type Initial;Spiritual support  Referral From Nurse  Consult/Referral To Chaplain  Spiritual Encounters  Spiritual Needs Emotional  Stress Factors  Patient Stress Factors Health changes  Family Stress Factors Exhausted;Major life changes

## 2012-09-22 NOTE — Progress Notes (Signed)
PULMONARY  / CRITICAL CARE MEDICINE  Name: [name] BLYDEN Sr. MRN: [id number] DOB: 1933-01-11    ADMISSION DATE:  10/15/2012 CONSULTATION DATE:  09/17/2012  REFERRING MD :  Dewayne Shorter PRIMARY SERVICE:  Internal Medicine  CHIEF COMPLAINT:  SOB  BRIEF PATIENT DESCRIPTION: 79 year old male W/ PMH Chronic resp failure on chronic pred and oxygen), admitted 7/7 with SOB and acute on chronicrespiratory failure (just discharged 7/2).   SIGNIFICANT EVENTS / STUDIES:  7/7 Admit  LINES / TUBES: R CW PAC >>  CULTURES: 7/7 blood x2 >>  ANTIBIOTICS:  COmpleted 10 ds of Primaxin 6/30 7/7 Cefepime >> 7/7 7/7 Vancomycin >> 7/7 Imipenem >> 7/7 Cipro >>  HISTORY OF PRESENT ILLNESS:  [name] Stevenson is a 79 year old male with a history of COPD and recent ESBL pneumonia. He was discharged on 7/2 (adm 6/12 ) and sustained a fall on day of discharge, with difficulty getting him up. His family notes that he has been SOB since discharge and has been requiring 4 L/min [place] at home. His SOB has continued to worsen and he is bringing up yellow sputum.   His SOB acutely worsened today and EMS was called. On arrival they noted SpO2 in the 80's, and was placed on CPAP by EMS, which was transitioned to BiPaP by the ED. He has received broad spectrum antibiotics while in ED.  PMH  Of NSCLC s/p RULectomy and adjuvant chemo in 2006    has a past medical history of Stricture and stenosis of esophagus (12/07/2004); GERD (gastroesophageal reflux disease); Hypoxemia; Increased prostate specific antigen (PSA) velocity (02/26/2011); DM (diabetes mellitus) (02/26/2011); Gout (02/26/2011); COPD (chronic obstructive pulmonary disease); OSA (obstructive sleep apnea) (02/26/2011); Paroxysmal atrial fibrillation; Depression; Cardiac arrest (2009); Hypertension; HTN (hypertension) (02/26/2011); Cardiac arrest (2009); Non-small cell lung cancer; Basal cell carcinoma of nose (02/26/2011); ETOH abuse (01/29/2012); CKD (chronic kidney disease)  (02/28/2012); Chronic diastolic CHF (congestive heart failure); Sick sinus syndrome; Bradycardia; Paroxysmal VT; CAD (coronary artery disease); Esophageal ulcer; Hiatal hernia; Valvular heart disease; Hemoptysis; Pneumonia; Fungal infection; and Hypoalbuminemia.   has past surgical history that includes Lung lobectomy; Knee arthroscopy; Visual merchandiser; Tonsillectomy; Hernia repair; Cardiac catheterization; Esophagogastroduodenoscopy (10/25/2011); Video bronchoscopy (N/A, 09/01/2012); and Video bronchoscopy (Bilateral, 09/11/2012).    SUBJECTIVE:    Feels better on BiPap  VITAL SIGNS: Temp:  [97.5 F (36.4 C)-98.6 F (37 C)] 97.9 F (36.6 C) (07/08 0826) Pulse Rate:  [70-77] 72 (07/08 0826) Resp:  [15-27] 16 (07/08 0826) BP: (110-141)/(53-80) 127/62 mmHg (07/08 0826) SpO2:  [92 %-100 %] 94 % (07/08 0826) FiO2 (%):  [30 %-39 %] 35 % (07/08 0826) Weight:  [87.1 kg (192 lb 0.3 oz)-87.4 kg (192 lb 10.9 oz)] 87.4 kg (192 lb 10.9 oz) (07/08 0402)  PHYSICAL EXAMINATION: General:  Chronically ill-appearing elderly male, in NAD Neuro:  A&Ox3, follows commands HEENT:  No scleral icterus Neck:  No JVD, trachea midline, no carotid bruit Cardiovascular:  RRR, S1 and S2. +2 radial and DP pulses Lungs:  Scattered rhonchi in anterior lung fields Abdomen:  Round, soft, non-tender, active BS Musculoskeletal:  MAE well and equal Skin:  Skin tears on left hand, left knee. Intact otherwise   Recent Labs Lab 09/20/2012 1211 09/22/12 0500  NA 131* 131*  K 3.1* 3.5  CL 85* 88*  CO2 33* 28  BUN 86* 95*  CREATININE 2.75* 3.07*  GLUCOSE 194* 259*    Recent Labs Lab 09/16/12 0920 09/18/2012 1211 09/22/12 0500  HGB 11.4* 11.5* 10.1*  HCT 33.4* 34.1* 30.0*  WBC 18.2* 18.9* 14.9*  PLT 266 242 241    Recent Labs Lab 10/08/2012 1211  PROBNP 806.5*    Dg Chest Portable 1 View  09/27/2012   *RADIOLOGY REPORT*  Clinical Data: Respiratory distress.  PORTABLE CHEST - 1 VIEW  Comparison: 09/14/2012   Findings: Dual lead pacemaker is in place.  Left subclavian central line has its tip in the SVC.  Cardiac silhouette is enlarged. There is pleural fluid bilaterally with some atelectasis in the lower lungs.  Upper lungs are clear.  IMPRESSION: Persistent effusions and lower lobe atelectasis/infiltrate.   Original Report Authenticated By: Paulina Fusi, [name]    ASSESSMENT / PLAN:   Acute on Chronic Respiratory Failure multifactoral in nature, likely causes are progressive deconditioning, COPD (not currently in acute exacerbation: O2 and pred dep), heart failure,  Pneumonia, acute on chronic renal failure Appears to have FTT since discharge. He does have h/o esophageal stricture, so this also raises the concern for aspiration.  CXR unchanged from prior with LLL atelectasis vs effusion H/O OSA (nocturnal CPAP)  09/22/2012: For unclear reasons was not on BiPAP last night and wife is upset about that  P: - Continue Bipap PRN and QHS - Can transition to nasal cannula for comfort - Continue antibiotics to cover ESBL, prefer Imipenem although I doubt that this is the cause of his worsening - Continue home BD regimen - Continue home inhaled steroid regimen - Would give lasix to keep euvolemic; track bnp as needed - Likely does not need systemic corticosteroids would quickly discontinue after 24 hours - could consider dysphagia eval and esophogram to eval for reflux IF he improves from current status.  _ If he does improve, would push PT, definitely cannot go home  All other issues:  A on C renal failure, fluid and electrolyte disturbances, mild Anemia of chronic disease, Afib, GERD, DM w/ Hyperglycemia per Primary service.   GLOBAL 10/03/2012: Discussed in detail with wife & son [name] Stevenson - DNR issued 09/22/12: Wife updated   Dr. Kalman Shan, [name], [facility].C.P Pulmonary and Critical Care Medicine Staff Physician [facility] [place] [facility] Pager: [phone], If no answer  or between  15:00h - 7:00h: call [phone]  09/22/2012 10:31 AM

## 2012-09-22 NOTE — Progress Notes (Signed)
CRITICAL VALUE ALERT  Critical value received:  Positive MRSA PCR  Date of notification:  10/04/2012  Time of notification:  2030  Critical value read back: yes  Nurse who received alert:  M.Piel, RN  MD notified (1st page):    Time of first page:    MD notified (2nd page):  Time of second page:  Responding MD:    Time MD responded:    MRSA standing orders initiated.  M.Piel, RN 

## 2012-09-22 NOTE — Consult Note (Signed)
WOC consult Note Reason for Consult: Consult requested for left hand skin tear; pt fell prior to admission. Wound type: Full thickness Measurement: 5X5X.2cm Wound bed: 85% red with no skin flap, 15% dark purple loose skin flap Drainage (amount, consistency, odor) small yellow drainage, no odor Periwound: skin intact surrounding Dressing procedure/placement/frequency: Bactroban to promote moist healing and antimicrobial benefits.  Foam dressing to protect from further injury. Wife at bedside to assess wounds.  Left outer ankle with 2X2cm stage 1 wound; present on admission Buttocks with red patchy rash.  Wife states pt had topical perscription cream ordered during previous admission to treat this area but does not know the name.  Please order if desired. Right Buttock with stage 2 wound 2X2X.1cm, 100% red, present on admission Left buttock with stage 2 wound .5X.5X.1cm, 100% red, present on admission Plan: Air mattress to increase airflow to buttocks to promote healing.  Barrier cream to protect skin.  Float heels to reduce pressure. Please re-consult if further assistance is needed.  Thank-you,  Cammie Mcgee MSN, RN, CWOCN, [place], CNS [phone]

## 2012-09-22 NOTE — Progress Notes (Addendum)
INITIAL NUTRITION ASSESSMENT  DOCUMENTATION CODES Per approved criteria  -Obesity Unspecified   INTERVENTION: Discontinue Ensure Complete daily. Add Glucerna Shake po BID, each supplement provides 220 kcal and 10 grams of protein. Continue MVI daily. RD to continue to follow nutrition care plan.  NUTRITION DIAGNOSIS: Increased nutrient needs r/t severe COPD AEB estimated needs  Goal: Intake to meet >90% of estimated nutrition needs.  Monitor:  weight trends, lab trends, I/O's, PO intake, supplement tolerance  Reason for Assessment: MD Consult for Nutrition Assessment  79 y.o. male  Admitting Dx: severe COPD with exacerbation  ASSESSMENT: PMHx of severe COPD, PNA, hemoptysis, chronic diastolic HF, afib. Admitted with SOB, weakness and lethargy. Recently admitted for prolonged admission 6/12-7/2 2/2 PNA.  Per chart review, pt with very poor prognosis 2/2 prolonged recent hospitalization, deconditioning, and FTT.  11% wt loss x 3 months; wife reports that this is fluid. Pt is eating 100% of meals at this time. Ordered for Ensure Complete po daily. Pt was drinking Ensure Complete PO BID PTA.  Pt is at nutrition risk given chronic illnesses and declining weights.  Height: Ht Readings from Last 1 Encounters:  09/26/2012 5' 6.5" (1.689 m)    Weight: Wt Readings from Last 1 Encounters:  09/22/12 192 lb 10.9 oz (87.4 kg)    Ideal Body Weight: 145 lb  % Ideal Body Weight: 132%  Wt Readings from Last 10 Encounters:  09/22/12 192 lb 10.9 oz (87.4 kg)  09/16/12 192 lb (87.091 kg)  09/16/12 192 lb (87.091 kg)  09/16/12 192 lb (87.091 kg)  09/16/12 192 lb (87.091 kg)  08/25/12 205 lb (92.987 kg)  08/18/12 204 lb 12.8 oz (92.897 kg)  08/04/12 201 lb 12.8 oz (91.536 kg)  06/10/12 215 lb (97.523 kg)  06/01/12 217 lb 12.8 oz (98.793 kg)    Usual Body Weight: 215 lb  % Usual Body Weight: 89%  BMI:  Body mass index is 30.64 kg/(m^2). Obese Class I.  Estimated Nutritional  Needs: Kcal: 1700 - 2000 Protein: 100 - 115 g Fluid: 1.8 - 2 liters daily  Skin:  Full thickness skin tear Stage I wound to outer ankle --> seen by WOC RN 7/8  Diet Order: Sodium Restricted  EDUCATION NEEDS: -No education needs identified at this time   Intake/Output Summary (Last 24 hours) at 09/22/12 1444 Last data filed at 09/22/12 1300  Gross per 24 hour  Intake    640 ml  Output   1450 ml  Net   -810 ml    Last BM: 7/1  Labs:   Recent Labs Lab 10/08/2012 1211 09/22/12 0500  NA 131* 131*  K 3.1* 3.5  CL 85* 88*  CO2 33* 28  BUN 86* 95*  CREATININE 2.75* 3.07*  CALCIUM 8.6 9.0  GLUCOSE 194* 259*    CBG (last 3)   Recent Labs  10/12/2012 2140 09/22/12 0825 09/22/12 1130  GLUCAP 303* 212* 268*    Scheduled Meds: . albuterol  2.5 mg Nebulization Q6H WA  . amiodarone  200 mg Oral BID  . aspirin EC  81 mg Oral Daily  . atorvastatin  40 mg Oral QHS  . Chlorhexidine Gluconate Cloth  6 each Topical Q0600  . citalopram  10 mg Oral Daily  . diltiazem  180 mg Oral BID  . docusate sodium  100 mg Oral BID  . ezetimibe  10 mg Oral Daily  . feeding supplement  237 mL Oral Q24H  . fluconazole  100 mg Oral Daily  .  guaiFENesin  1,200 mg Oral BID  . heparin  5,000 Units Subcutaneous Q8H  . insulin aspart  0-9 Units Subcutaneous TID WC  . insulin glargine  3 Units Subcutaneous QHS  . ipratropium  0.5 mg Nebulization Q6H  . magnesium oxide  400 mg Oral Daily  . methylPREDNISolone (SOLU-MEDROL) injection  60 mg Intravenous TID  . metoprolol succinate  25 mg Oral BID WC  . mineral oil  1 enema Rectal Daily  . multivitamin with minerals  1 tablet Oral Daily  . mupirocin cream   Topical BID  . mupirocin ointment  1 application Nasal BID  . omega-3 acid ethyl esters  2 g Oral Daily  . pantoprazole  40 mg Oral Daily  . potassium chloride SA  20 mEq Oral Daily  . senna  1 tablet Oral BID  . sodium chloride  3 mL Intravenous Q12H  . sodium chloride  3 mL  Intravenous Q12H  . vitamin B-12  50 mcg Oral Daily  . vitamin E  400 Units Oral BID    Continuous Infusions:   Past Medical History  Diagnosis Date  . Stricture and stenosis of esophagus 12/07/2004    EGD done on 12/07/2004  . GERD (gastroesophageal reflux disease)   . Hypoxemia     With chronic respiratory failure  . Increased prostate specific antigen (PSA) velocity 02/26/2011  . DM (diabetes mellitus) 02/26/2011  . Gout 02/26/2011  . COPD (chronic obstructive pulmonary disease)     a. oxygen at home, 4L. b. On prednisone.  . OSA (obstructive sleep apnea) 02/26/2011    CPAP  . Paroxysmal atrial fibrillation   . Depression   . Cardiac arrest 2009    while in hospital   . Hypertension   . HTN (hypertension) 02/26/2011  . Cardiac arrest 2009    while in hospital  . Non-small cell lung cancer     a. s/p RUL lobectomy, adjuvant chemo 2006. b. New lung nodule seen 08/2012, OP f/u recommended.  . Basal cell carcinoma of nose 02/26/2011  . ETOH abuse 01/29/2012    started drinking again - hx cardiac arrest in 2009 due to dt's with organ failure  . CKD (chronic kidney disease) 02/28/2012  . Chronic diastolic CHF (congestive heart failure)     a. EF 65-70% 08/2012. b. Complex picture with ascites/anasarca/low albumin/CKD.  . Sick sinus syndrome     a. s/p Medtronic pacemaker 2010.  . Bradycardia     a. Admitted 2010 with severe bradycardia, runs of paroxysmal VT.   Marland Kitchen Paroxysmal VT     a. In 2010 in setting of marked bradycardia.  Marland Kitchen CAD (coronary artery disease)     a. Nonobstructive CAD by cath 2006.  Marland Kitchen Esophageal ulcer     a. By EGD 10/2011.  Marland Kitchen Hiatal hernia     a. By EGD 10/2011.  . Valvular heart disease     a. Mild MR/mild AI/mild AS by echo 05/2011.  Marland Kitchen Hemoptysis     a. 08/2012:  Bronchoscopy 09/01/12 did not determine a source - bronchial washings did not show malignant cells on path. ENT did not find any pathology in the upper most respiratory/gi tract by fiberoptic  laryngoscopy. A second bronchoscopy was done on 09/11/12 - this time a lesion along the RML suture line from previous lobectomy was found, but without evidence for malignancy on biopsy.   . Pneumonia     a. Hosp 08/2012 with + ESBL E coli, candida albicans on BAL  tx with levaquin, primaxin, diflucan - complex hospitalization with CKD, anasarca, CHF, afib, hemoptysis.  . Fungal infection     a. Rash 08/2012 - felt to be disseminated fungal infection, also treated with Valtrex for possible buttocks shingles.  . Hypoalbuminemia     a. 08/2012.    Past Surgical History  Procedure Laterality Date  . Lung lobectomy      x2 lobes on RT lung  . Knee arthroscopy      bilateral  . Pace maker    . Tonsillectomy    . Hernia repair      x2, esophageal  . Cardiac catheterization    . Esophagogastroduodenoscopy  10/25/2011    Procedure: ESOPHAGOGASTRODUODENOSCOPY (EGD);  Surgeon: Hart Carwin, MD;  Location: [facility];  Service: Endoscopy;  Laterality: N/A;  . Video bronchoscopy N/A 09/01/2012    Procedure: VIDEO BRONCHOSCOPY WITHOUT FLUORO;  Surgeon: Storm Frisk, MD;  Location: [facility] ENDOSCOPY;  Service: Cardiopulmonary;  Laterality: N/A;  . Video bronchoscopy Bilateral 09/11/2012    Procedure: VIDEO BRONCHOSCOPY WITHOUT FLUORO;  Surgeon: Leslye Peer, MD;  Location: [facility] ENDOSCOPY;  Service: Cardiopulmonary;  Laterality: Bilateral;    Jarold Motto MS, RD, LDN Pager: [phone] After-hours pager: [phone]

## 2012-09-22 NOTE — Progress Notes (Signed)
Patient Name: [name] STANSBERY Sr.      SUBJECTIVE:  resamitted with resp failure with co2 depdt COPD , possible pneumonia general weakness and chronic rate controlled afib and ? Aspiration  Feels a little better  Past Medical History  Diagnosis Date  . Stricture and stenosis of esophagus 12/07/2004    EGD done on 12/07/2004  . GERD (gastroesophageal reflux disease)   . Hypoxemia     With chronic respiratory failure  . Increased prostate specific antigen (PSA) velocity 02/26/2011  . DM (diabetes mellitus) 02/26/2011  . Gout 02/26/2011  . COPD (chronic obstructive pulmonary disease)     a. oxygen at home, 4L. b. On prednisone.  . OSA (obstructive sleep apnea) 02/26/2011    CPAP  . Paroxysmal atrial fibrillation   . Depression   . Cardiac arrest 2009    while in hospital   . Hypertension   . HTN (hypertension) 02/26/2011  . Cardiac arrest 2009    while in hospital  . Non-small cell lung cancer     a. s/p RUL lobectomy, adjuvant chemo 2006. b. New lung nodule seen 08/2012, OP f/u recommended.  . Basal cell carcinoma of nose 02/26/2011  . ETOH abuse 01/29/2012    started drinking again - hx cardiac arrest in 2009 due to dt's with organ failure  . CKD (chronic kidney disease) 02/28/2012  . Chronic diastolic CHF (congestive heart failure)     a. EF 65-70% 08/2012. b. Complex picture with ascites/anasarca/low albumin/CKD.  . Sick sinus syndrome     a. s/p Medtronic pacemaker 2010.  . Bradycardia     a. Admitted 2010 with severe bradycardia, runs of paroxysmal VT.   Marland Kitchen Paroxysmal VT     a. In 2010 in setting of marked bradycardia.  Marland Kitchen CAD (coronary artery disease)     a. Nonobstructive CAD by cath 2006.  Marland Kitchen Esophageal ulcer     a. By EGD 10/2011.  Marland Kitchen Hiatal hernia     a. By EGD 10/2011.  . Valvular heart disease     a. Mild MR/mild AI/mild AS by echo 05/2011.  Marland Kitchen Hemoptysis     a. 08/2012:  Bronchoscopy 09/01/12 did not determine a source - bronchial washings did not show malignant  cells on path. ENT did not find any pathology in the upper most respiratory/gi tract by fiberoptic laryngoscopy. A second bronchoscopy was done on 09/11/12 - this time a lesion along the RML suture line from previous lobectomy was found, but without evidence for malignancy on biopsy.   . Pneumonia     a. Hosp 08/2012 with + ESBL E coli, candida albicans on BAL tx with levaquin, primaxin, diflucan - complex hospitalization with CKD, anasarca, CHF, afib, hemoptysis.  . Fungal infection     a. Rash 08/2012 - felt to be disseminated fungal infection, also treated with Valtrex for possible buttocks shingles.  . Hypoalbuminemia     a. 08/2012.    Scheduled Meds:  Scheduled Meds: . albuterol  2.5 mg Nebulization Q6H WA  . amiodarone  200 mg Oral BID  . aspirin EC  81 mg Oral Daily  . atorvastatin  40 mg Oral QHS  . Chlorhexidine Gluconate Cloth  6 each Topical Q0600  . ciprofloxacin  400 mg Intravenous Q24H  . citalopram  10 mg Oral Daily  . diltiazem  180 mg Oral BID  . docusate sodium  100 mg Oral BID  . ezetimibe  10 mg Oral Daily  . feeding supplement  237  mL Oral Q24H  . fluconazole  100 mg Oral Daily  . furosemide  160 mg Oral BID  . guaiFENesin  1,200 mg Oral BID  . heparin  5,000 Units Subcutaneous Q8H  . imipenem-cilastatin  250 mg Intravenous Q6H  . insulin aspart  0-9 Units Subcutaneous TID WC  . insulin glargine  3 Units Subcutaneous QHS  . ipratropium  0.5 mg Nebulization Q6H  . magnesium oxide  400 mg Oral Daily  . methylPREDNISolone (SOLU-MEDROL) injection  80 mg Intravenous TID  . metoprolol succinate  25 mg Oral BID WC  . mineral oil  1 enema Rectal Daily  . multivitamin with minerals  1 tablet Oral Daily  . mupirocin ointment  1 application Nasal BID  . omega-3 acid ethyl esters  2 g Oral Daily  . pantoprazole  40 mg Oral Daily  . potassium chloride SA  20 mEq Oral Daily  . senna  1 tablet Oral BID  . sodium chloride  3 mL Intravenous Q12H  . sodium chloride  3 mL  Intravenous Q12H  . vitamin B-12  50 mcg Oral Daily  . vitamin E  400 Units Oral BID   Continuous Infusions:   PHYSICAL EXAM Filed Vitals:   09/22/12 0144 09/22/12 0402 09/22/12 0755 09/22/12 0826  BP:  116/71  127/62  Pulse:  71  72  Temp:  97.6 F (36.4 C)  97.9 F (36.6 C)  TempSrc:  Axillary  Axillary  Resp:  20  16  Height:      Weight:  192 lb 10.9 oz (87.4 kg)    SpO2: 94% 92% 97% 94%    Well developed and nourished on O2HENT normal Neck supple   Coarse  Regular rate and rhythm, no murmurs or gallops Abd-soft with active BS No Clubbing cyanosis min edema x right hand Skin-warm and dry A & Oriented  Grossly normal sensory and motor function   TELEMETRY: Reviewed telemetry pt in afib cvr:    Intake/Output Summary (Last 24 hours) at 09/22/12 0843 Last data filed at 09/22/12 0606  Gross per 24 hour  Intake    200 ml  Output    600 ml  Net   -400 ml    LABS: Basic Metabolic Panel:  Recent Labs Lab October 14, 2012 1211 09/22/12 0500  NA 131* 131*  K 3.1* 3.5  CL 85* 88*  CO2 33* 28  GLUCOSE 194* 259*  BUN 86* 95*  CREATININE 2.75* 3.07*  CALCIUM 8.6 9.0   Cardiac Enzymes:  Recent Labs  October 14, 2012 1932 10-14-2012 2219 09/22/12 0419  TROPONINI <0.30 <0.30 <0.30   CBC:  Recent Labs Lab 09/16/12 0920 Oct 14, 2012 1211 09/22/12 0500  WBC 18.2* 18.9* 14.9*  NEUTROABS  --  15.6*  --   HGB 11.4* 11.5* 10.1*  HCT 33.4* 34.1* 30.0*  MCV 92.0 94.7 93.5  PLT 266 242 241   PROTIME: No results found for this basename: LABPROT, INR,  in the last 72 hours Liver Function Tests:  Recent Labs  10-14-2012 1211 09/22/12 0500  AST 31 23  ALT 37 32  ALKPHOS 110 87  BILITOT 0.5 0.4  PROT 5.9* 5.7*  ALBUMIN 2.4* 2.2*   No results found for this basename: LIPASE, AMYLASE,  in the last 72 hours BNP: BNP (last 3 results)  Recent Labs  09/06/12 0545 09/11/12 0500 October 14, 2012 1211  PROBNP 1793.0* 1709.0* 806.5*     ASSESSMENT AND PLAN:  Principal  Problem:   Acute-on-chronic respiratory failure Active Problems:  GERD   OSA (obstructive sleep apnea)   HTN (hypertension)   DM (diabetes mellitus) type II controlled with renal manifestation   Atrial fibrillation   Chronic diastolic CHF (congestive heart failure)   Obstructive chronic bronchitis with exacerbation  euvoliemic  Worsening renal failure   Signed, Sherryl Manges MD  09/22/2012

## 2012-09-22 NOTE — Progress Notes (Addendum)
[facility] Progress Note [place] TEAM 1 - Stepdown/ICU TEAM   [name] Pace Humber Sr. ZOX:[id number] DOB: 28-May-1932 DOA: 2012/10/16 PCP: [name], MD  Brief narrative: 79 year old male W/ PMH Chronic resp failure on chronic pred and oxygen), admitted 7/7 with SOB and acute on chronic respiratory failure (just discharged 7/2). Pt has been total care since dc and has been unable to ambulate as well. He developed yellow sputum and cough despite completion of anbx's after dc. EMS was called and upon their arrival he was found to have saturations in the 80's so was placed on CPAP. In the ER he was transitioned to BiPAP and was started on broad spectrum anbx's. CXR revealed persistent pleural effusions and ?? LLL infiltrate so he was started on high dose Lasix.   Assessment/Plan: Active Problems:   Acute-on-chronic respiratory failure with Hypoxia due to:   A) Obstructive chronic bronchitis with exacerbation   B)  Acute on chronic diastolic congestive heart failure -clinically does not appear to have focal pneumonia nor CHF -suspect had acute flare of DHF which responded to Lasix since now has successfully weaned from BiPAP noting pt had minimal UOP despite high dose Lasix- he actually appears dry now (see below) -will dc anbx's and Lasix -taper steroids- was on prednisone 10 mg daily at home -VERY deconditioned -suspect leukocytosis from steroids and dehydration- no fever -cont Diflucan since recent broad spectrum anbx's and high risk for candida    CKD (chronic kidney disease), stage III -BUN/Scr has worsened with Lasix so will dc and gently hydrate-was on very high dose Lasix at home given low creatinine clearance -check BMET in am -hold offending meds   Right upper extremity DVT -begin heparin IV for now since CKD- previously not candidate  -legs with edema so since CKD 3 will check LE dopplers as opposed to CT Chest    Dysphagia/Deconditioning -wife states poor intake pre  admit -PT/OT eval -LTAC vs SNF- wife says he;s too much care for her and family at home    HTN (hypertension) -BP soft so watch for hypotension with CCB and BB    Diabetes type 2, uncontrolled -due to acute increase in steroid dose -cont SSI and Lantus and adjust dose based on CBG and PO intake    Atrial fibrillation -rate controlled with meds    Dehydration with hyponatremia -Urine sodium low suggestive of dehydration -gentle IVF hydration    CARCINOMA, LUNG, SQUAMOUS CELL  -tx'd 2006    CORONARY ARTERY DISEASE-nonobstructive disease 2006  catheterization  -no sx's    OSA (obstructive sleep apnea) -?? Nocturnal CPAP    Hypoalbuminemia -this is primary reason for extremity edema- albumin 1.6 -nutrition consult -use ACE wraps   DVT prophylaxis: SCDs Code Status: DO NOT RESUSCITATE Family Communication: Patient and wife at bedside Disposition Plan: Transfer to floor Isolation: Contact isolation for MRSA PCR positive status as well as recent treatment for ESBL Escherichia coli pneumonia Nutritional Status: Compromised with severe protein calorie malnutrition please see above  Consultants: Cardiology Pulmonology  Procedures: None  Antibiotics: Maxipime, Cipro and vancomycin 7/7 >>> 7/8  HPI/Subjective: Patient alert and awake. Able to sit himself up in bed with moderate assistance. Denies chest pain or shortness of breath. Endorses clear to white productive sputum.   Objective: Blood pressure 121/73, pulse 71, temperature 96.4 F (35.8 C), temperature source Axillary, resp. rate 16, height 5' 6.5" (1.689 m), weight 87.4 kg (192 lb 10.9 oz), SpO2 96.00%.  Intake/Output Summary (Last 24 hours)  at 09/22/12 1229 Last data filed at 09/22/12 1129  Gross per 24 hour  Intake    610 ml  Output   1450 ml  Net   -840 ml     Exam: General: No acute respiratory distress Lungs: Clear to auscultation bilaterally except for left basilar rhonchi otherwise without  wheezes or crackles, 35% Ventimask Cardiovascular: AV sequential pacing with regular rate and rhythm without murmur gallop or rub normal S1 and S2, significant 2+ peripheral edema x 4 without JVD Abdomen: Nontender, nondistended, soft, bowel sounds positive, no rebound, no ascites, no appreciable mass Musculoskeletal: No significant cyanosis, clubbing of bilateral lower extremities Neurological: Alert and oriented x 3, moves all extremities x 4 without focal neurological deficits but has diffuse generalized weakness with strength being 3/5, CN 2-12 intact  Scheduled Meds: Scheduled Meds: . albuterol  2.5 mg Nebulization Q6H WA  . amiodarone  200 mg Oral BID  . aspirin EC  81 mg Oral Daily  . atorvastatin  40 mg Oral QHS  . Chlorhexidine Gluconate Cloth  6 each Topical Q0600  . citalopram  10 mg Oral Daily  . diltiazem  180 mg Oral BID  . docusate sodium  100 mg Oral BID  . ezetimibe  10 mg Oral Daily  . feeding supplement  237 mL Oral Q24H  . fluconazole  100 mg Oral Daily  . guaiFENesin  1,200 mg Oral BID  . heparin  5,000 Units Subcutaneous Q8H  . insulin aspart  0-9 Units Subcutaneous TID WC  . insulin glargine  3 Units Subcutaneous QHS  . ipratropium  0.5 mg Nebulization Q6H  . magnesium oxide  400 mg Oral Daily  . methylPREDNISolone (SOLU-MEDROL) injection  60 mg Intravenous TID  . metoprolol succinate  25 mg Oral BID WC  . mineral oil  1 enema Rectal Daily  . multivitamin with minerals  1 tablet Oral Daily  . mupirocin cream   Topical BID  . mupirocin ointment  1 application Nasal BID  . omega-3 acid ethyl esters  2 g Oral Daily  . pantoprazole  40 mg Oral Daily  . potassium chloride SA  20 mEq Oral Daily  . senna  1 tablet Oral BID  . sodium chloride  3 mL Intravenous Q12H  . sodium chloride  3 mL Intravenous Q12H  . vitamin B-12  50 mcg Oral Daily  . vitamin E  400 Units Oral BID   Continuous Infusions:   **Reviewed in detail by the Attending Physician   Data  Reviewed: Basic Metabolic Panel:  Recent Labs Lab 2012-10-02 1211 09/22/12 0500  NA 131* 131*  K 3.1* 3.5  CL 85* 88*  CO2 33* 28  GLUCOSE 194* 259*  BUN 86* 95*  CREATININE 2.75* 3.07*  CALCIUM 8.6 9.0   Liver Function Tests:  Recent Labs Lab 2012-10-02 1211 09/22/12 0500  AST 31 23  ALT 37 32  ALKPHOS 110 87  BILITOT 0.5 0.4  PROT 5.9* 5.7*  ALBUMIN 2.4* 2.2*   No results found for this basename: LIPASE, AMYLASE,  in the last 168 hours No results found for this basename: AMMONIA,  in the last 168 hours CBC:  Recent Labs Lab 09/16/12 0920 Oct 02, 2012 1211 09/22/12 0500  WBC 18.2* 18.9* 14.9*  NEUTROABS  --  15.6*  --   HGB 11.4* 11.5* 10.1*  HCT 33.4* 34.1* 30.0*  MCV 92.0 94.7 93.5  PLT 266 242 241   Cardiac Enzymes:  Recent Labs Lab 10-02-2012 1211 10/02/2012 1932  09/16/2012 2219 09/22/12 0419  TROPONINI <0.30 <0.30 <0.30 <0.30   BNP (last 3 results)  Recent Labs  09/06/12 0545 09/11/12 0500 09/27/2012 1211  PROBNP 1793.0* 1709.0* 806.5*   CBG:  Recent Labs Lab 09/16/12 1142 10/14/2012 1717 09/19/2012 2140 09/22/12 0825 09/22/12 1130  GLUCAP 142* 249* 303* 212* 268*    Recent Results (from the past 240 hour(s))  CULTURE, BLOOD (ROUTINE X 2)     Status: None   Collection Time    09/13/12  2:11 PM      Result Value Range Status   Specimen Description BLOOD RIGHT ARM   Final   Special Requests BOTTLES DRAWN AEROBIC AND ANAEROBIC 10CC   Final   Culture  Setup Time 09/13/2012 19:02   Final   Culture NO GROWTH 5 DAYS   Final   Report Status 09/20/2012 FINAL   Final  CULTURE, BLOOD (ROUTINE X 2)     Status: None   Collection Time    09/13/12  2:11 PM      Result Value Range Status   Specimen Description BLOOD RIGHT HAND   Final   Special Requests BOTTLES DRAWN AEROBIC AND ANAEROBIC 10CC   Final   Culture  Setup Time 09/13/2012 19:02   Final   Culture NO GROWTH 5 DAYS   Final   Report Status 09/20/2012 FINAL   Final  CULTURE, EXPECTORATED  SPUTUM-ASSESSMENT     Status: None   Collection Time    09/14/12  5:24 PM      Result Value Range Status   Specimen Description SPUTUM   Final   Special Requests Normal   Final   Sputum evaluation     Final   Value: THIS SPECIMEN IS ACCEPTABLE. RESPIRATORY CULTURE REPORT TO FOLLOW.   Report Status 09/14/2012 FINAL   Final  CULTURE, RESPIRATORY (NON-EXPECTORATED)     Status: None   Collection Time    09/14/12  5:24 PM      Result Value Range Status   Specimen Description SPUTUM   Final   Special Requests NONE   Final   Gram Stain     Final   Value: NO WBC SEEN     NO SQUAMOUS EPITHELIAL CELLS SEEN     NO ORGANISMS SEEN   Culture NORMAL OROPHARYNGEAL FLORA   Final   Report Status 09/17/2012 FINAL   Final  CULTURE, BLOOD (ROUTINE X 2)     Status: None   Collection Time    10/14/2012  1:46 PM      Result Value Range Status   Specimen Description BLOOD ARM RIGHT   Final   Special Requests     Final   Value: BOTTLES DRAWN AEROBIC AND ANAEROBIC 10CCBLUE 5CCRED   Culture  Setup Time 09/17/2012 17:32   Final   Culture     Final   Value:        BLOOD CULTURE RECEIVED NO GROWTH TO DATE CULTURE WILL BE HELD FOR 5 DAYS BEFORE ISSUING A FINAL NEGATIVE REPORT   Report Status PENDING   Incomplete  CULTURE, BLOOD (ROUTINE X 2)     Status: None   Collection Time    10/11/2012  1:50 PM      Result Value Range Status   Specimen Description BLOOD HAND RIGHT   Final   Special Requests BOTTLES DRAWN AEROBIC ONLY 2.5CC   Final   Culture  Setup Time 10/10/2012 17:32   Final   Culture     Final  Value:        BLOOD CULTURE RECEIVED NO GROWTH TO DATE CULTURE WILL BE HELD FOR 5 DAYS BEFORE ISSUING A FINAL NEGATIVE REPORT   Report Status PENDING   Incomplete  MRSA PCR SCREENING     Status: Abnormal   Collection Time    10/04/2012  5:09 PM      Result Value Range Status   MRSA by PCR POSITIVE (*) NEGATIVE Final   Comment:            The GeneXpert MRSA Assay (FDA     approved for NASAL specimens      only), is one component of a     comprehensive MRSA colonization     surveillance program. It is not     intended to diagnose MRSA     infection nor to guide or     monitor treatment for     MRSA infections.     RESULT CALLED TO, READ BACK BY AND VERIFIED WITH:     Bridget Hartshorn RN [id number] 09/20/2012 A BROWNING     Studies:  Recent x-ray studies have been reviewed in detail by the Attending Physician    Junious Silk, ANP [facility] Office  [phone] Pager [phone]  **If unable to reach the above provider after paging please contact the Flow Manager @ [phone]  On-Call/Text Page:      [email or web address]      password [email or web address]  If 7PM-7AM, please contact night-coverage [email or web address] Password [facility] 09/22/2012, 12:29 PM   LOS: 1 day   I have examined the patient, reviewed the chart and modified the above note which I agree with.   Ayauna Mcnay,MD [phone] 09/22/2012, 2:17 PM

## 2012-09-22 NOTE — Progress Notes (Signed)
VASCULAR LAB PRELIMINARY  PRELIMINARY  PRELIMINARY  PRELIMINARY  Right upper extremity venous duplex completed.    Preliminary report:  Right:  DVT noted in the subclavian and axillary veins.  Superficial thrombosis noted in the basilic vein.  Edmond Ginsberg, RVT 09/22/2012, 1:41 PM

## 2012-09-22 NOTE — Progress Notes (Signed)
Utilization review completed.  P.J. Lamaj Metoyer,RN,BSN Case Manager [phone]  

## 2012-09-22 NOTE — Progress Notes (Signed)
Positive DVT to RUE called to MD Rizwan. [name] Stevenson L

## 2012-09-22 NOTE — Care Management Note (Signed)
  Page 1 of 1   09/22/2012     10:34:02 AM   CARE MANAGEMENT NOTE 09/22/2012  Patient:  [name] Stevenson, [name] Stevenson   Account Number:  [id number]  Date Initiated:  09/22/2012  Documentation initiated by:  Alvira Philips Assessment:   79 yr-old male adm with dx of resp failure; lives with spouse, has home O2, CPAP, and neb machine through [employer]; active with [facility] for RN, PT, aide     In-house referral  Clinical Social Worker      DC Planning Services  CM consult      Per UR Regulation:  Reviewed for med. necessity/level of care/duration of stay  Comments:  09/22/12 1027 Antonis Lor RN MSN BSN CCM Per spouse, pt fell after discharging home on 7/2, has been increasingly weak and SOB.  She states she cannot manage pt @ home and requests transfer to SNF for rehab when medically stable.  Referral to CSW.

## 2012-09-22 NOTE — Progress Notes (Signed)
PT Cancellation Note  Patient Details Name: [name] Stevenson Sr. MRN: [id number] DOB: [birth year]   Cancelled Treatment:     RN deferred OOB mobility at this time due to awaiting doppler study to rule out LE DVT. PT to re-attempt as able.   Aritha Huckeba, Becky Sax 09/22/2012, 4:20 PM

## 2012-09-22 NOTE — Progress Notes (Signed)
Attempted to call report x 1. [phone] RN to call [phone] RN when ready for report. Jakhari Space L

## 2012-09-23 ENCOUNTER — Telehealth: Payer: Self-pay | Admitting: Emergency Medicine

## 2012-09-23 DIAGNOSIS — M7989 Other specified soft tissue disorders: Secondary | ICD-10-CM

## 2012-09-23 LAB — CBC
Hemoglobin: 10 g/dL — ABNORMAL LOW (ref 13.0–17.0)
MCH: 31.9 pg (ref 26.0–34.0)
MCV: 95.5 fL (ref 78.0–100.0)
Platelets: 270 10*3/uL (ref 150–400)
RBC: 3.13 MIL/uL — ABNORMAL LOW (ref 4.22–5.81)
WBC: 18.4 10*3/uL — ABNORMAL HIGH (ref 4.0–10.5)

## 2012-09-23 LAB — GLUCOSE, CAPILLARY
Glucose-Capillary: 279 mg/dL — ABNORMAL HIGH (ref 70–99)
Glucose-Capillary: 297 mg/dL — ABNORMAL HIGH (ref 70–99)
Glucose-Capillary: 268 mg/dL — ABNORMAL HIGH (ref 70–99)

## 2012-09-23 LAB — BASIC METABOLIC PANEL
CO2: 29 mEq/L (ref 19–32)
Calcium: 8.7 mg/dL (ref 8.4–10.5)
Chloride: 88 mEq/L — ABNORMAL LOW (ref 96–112)
Glucose, Bld: 333 mg/dL — ABNORMAL HIGH (ref 70–99)
Potassium: 3.3 mEq/L — ABNORMAL LOW (ref 3.5–5.1)
Sodium: 129 mEq/L — ABNORMAL LOW (ref 135–145)

## 2012-09-23 LAB — HEPARIN LEVEL (UNFRACTIONATED)
Heparin Unfractionated: 0.63 IU/mL (ref 0.30–0.70)
Heparin Unfractionated: 0.65 IU/mL (ref 0.30–0.70)

## 2012-09-23 MED ORDER — SORBITOL 70 % SOLN
960.0000 mL | TOPICAL_OIL | Freq: Once | ORAL | Status: AC
  Administered 2012-09-24: 960 mL via RECTAL
  Filled 2012-09-23: qty 240

## 2012-09-23 NOTE — Progress Notes (Signed)
Pt assessment completed, VSS, denies any pain or discomfort, family member at the bedside. Pt c/o SOB breathing treatment given as ordered no distress noticed. Pt continues on Heparin gtt at 10 mL/hr as ordered. We'll continue with POC.

## 2012-09-23 NOTE — Telephone Encounter (Signed)
Thank you :)

## 2012-09-23 NOTE — Progress Notes (Signed)
ANTICOAGULATION CONSULT NOTE - Follow Up Consult  Pharmacy Consult for heparin Indication: DVT  Labs:  Recent Labs  10/07/2012 1211 09/17/2012 1932 09/22/2012 2219 09/22/12 0419 09/22/12 0500 09/23/12 0100  HGB 11.5*  --   --   --  10.1*  --   HCT 34.1*  --   --   --  30.0*  --   PLT 242  --   --   --  241  --   HEPARINUNFRC  --   --   --   --   --  0.63  CREATININE 2.75*  --   --   --  3.07*  --   TROPONINI <0.30 <0.30 <0.30 <0.30  --   --     Assessment: 79yo male slightly supratherapeutic on heparin given low goal; required rate up to 1250 units/hr on recent admission.  Goal of Therapy:  Heparin level 0.3-0.5 units/ml   Plan:  Will decrease heparin gtt by ~1 unit/kg/hr to 900 units/hr and check level in 8hr.  Vernard Gambles, PharmD, BCPS  09/23/2012,1:39 AM

## 2012-09-23 NOTE — Progress Notes (Signed)
PULMONARY  / CRITICAL CARE MEDICINE  Name: [name] METAYER Sr. MRN: [id number] DOB: [birth year]    ADMISSION DATE:  09/18/2012 CONSULTATION DATE:  10/04/2012  REFERRING MD :  Dewayne Shorter PRIMARY SERVICE:  Internal Medicine  CHIEF COMPLAINT:  SOB  BRIEF PATIENT DESCRIPTION: 79 year old male W/ PMH Chronic resp failure on chronic pred and oxygen), admitted 7/7 with SOB and acute on chronicrespiratory failure (just discharged 7/2).   SIGNIFICANT EVENTS / STUDIES:  7/7 Admit  LINES / TUBES: R CW PAC >>  CULTURES: 7/7 blood x2 >>ng  ANTIBIOTICS:  COmpleted 10 ds of Primaxin 6/30 7/7 Cefepime >> 7/7 7/7 Vancomycin >> 7/7 Imipenem >> 7/7 Cipro >>  HISTORY OF PRESENT ILLNESS:  [name] Stevenson is a 79 year old male with a history of COPD and recent ESBL pneumonia. He was discharged on 7/2 (adm 6/12 ) and sustained a fall on day of discharge, with difficulty getting him up. His family notes that he has been SOB since discharge and has been requiring 4 L/min [place] at home. His SOB has continued to worsen and he is bringing up yellow sputum.   His SOB acutely worsened today and EMS was called. On arrival they noted SpO2 in the 80's, and was placed on CPAP by EMS, which was transitioned to BiPaP by the ED. He has received broad spectrum antibiotics while in ED.  PMH  Of NSCLC s/p RULectomy and adjuvant chemo in 2006    has a past medical history of Stricture and stenosis of esophagus (12/07/2004); GERD (gastroesophageal reflux disease); Hypoxemia; Increased prostate specific antigen (PSA) velocity (02/26/2011); DM (diabetes mellitus) (02/26/2011); Gout (02/26/2011); COPD (chronic obstructive pulmonary disease); OSA (obstructive sleep apnea) (02/26/2011); Paroxysmal atrial fibrillation; Depression; Cardiac arrest (2009); Hypertension; HTN (hypertension) (02/26/2011); Cardiac arrest (2009); Non-small cell lung cancer; Basal cell carcinoma of nose (02/26/2011); ETOH abuse (01/29/2012); CKD (chronic kidney disease)  (02/28/2012); Chronic diastolic CHF (congestive heart failure); Sick sinus syndrome; Bradycardia; Paroxysmal VT; CAD (coronary artery disease); Esophageal ulcer; Hiatal hernia; Valvular heart disease; Hemoptysis; Pneumonia; Fungal infection; and Hypoalbuminemia.   has past surgical history that includes Lung lobectomy; Knee arthroscopy; Visual merchandiser; Tonsillectomy; Hernia repair; Cardiac catheterization; Esophagogastroduodenoscopy (10/25/2011); Video bronchoscopy (N/A, 09/01/2012); and Video bronchoscopy (Bilateral, 09/11/2012).    SUBJECTIVE:    Feels better , reminas on venti mask C/o constipation Family at bedside  VITAL SIGNS: Temp:  [97.5 F (36.4 C)-98.6 F (37 C)] 98 F (36.7 C) (07/09 1500) Pulse Rate:  [70-92] 89 (07/09 1500) Resp:  [20] 20 (07/09 1500) BP: (123-144)/(68-77) 123/74 mmHg (07/09 1500) SpO2:  [93 %-100 %] 95 % (07/09 1500) FiO2 (%):  [35 %] 35 % (07/08 2135) Weight:  [195 lb 5.2 oz (88.6 kg)] 195 lb 5.2 oz (88.6 kg) (07/09 0607)  PHYSICAL EXAMINATION: General:  Chronically ill-appearing elderly male, in NAD Neuro:  A&Ox3, follows commands HEENT:  No scleral icterus Neck:  No JVD, trachea midline, no carotid bruit Cardiovascular:  RRR, S1 and S2. +2 radial and DP pulses Lungs:  Scattered rhonchi in anterior lung fields Abdomen:  Round, soft, non-tender, active BS Musculoskeletal:  MAE well and equal Skin:  Skin tears on left hand, left knee. Intact otherwise   Recent Labs Lab 10/08/2012 1211 09/22/12 0500 09/23/12 0100  NA 131* 131* 129*  K 3.1* 3.5 3.3*  CL 85* 88* 88*  CO2 33* 28 29  BUN 86* 95* 94*  CREATININE 2.75* 3.07* 3.12*  GLUCOSE 194* 259* 333*    Recent Labs Lab 09/25/2012  1211 09/22/12 0500 09/23/12 0100  HGB 11.5* 10.1* 10.0*  HCT 34.1* 30.0* 29.9*  WBC 18.9* 14.9* 18.4*  PLT 242 241 270    Recent Labs Lab 10/05/2012 1211  PROBNP 806.5*    No results found.  ASSESSMENT / PLAN:   Acute on Chronic Respiratory Failure  multifactoral in nature, likely causes are progressive deconditioning, COPD (not currently in acute exacerbation: O2 and pred dep), heart failure,  Pneumonia, acute on chronic renal failure Appears to have FTT since discharge. He does have h/o esophageal stricture, so this also raises the concern for aspiration.  CXR unchanged from prior with LLL atelectasis vs effusion H/O OSA (nocturnal CPAP)  09/22/2012: For unclear reasons was not on BiPAP last night and [name] Stevenson is upset about that  P: - Continue Bipap PRN and QHS - Can transition to nasal cannula for comfort - satn 90% & above acceptable - Continue antibiotics to cover ESBL, prefer Imipenem although I doubt that this is the cause of his worsening - Continue home BD regimen - Continue home inhaled steroid regimen - Would give lasix to keep euvolemic; track bnp as needed - dc systemic corticosteroids  - could consider dysphagia eval and esophogram to eval for reflux IF he improves from current status.  _ If he does improve, would push PT, definitely cannot go home   Constipation- enema planned  RUE DVT - coumadin x 3 mnths   All other issues:  A on C renal failure, fluid and electrolyte disturbances, mild Anemia of chronic disease, Afib, GERD, DM w/ Hyperglycemia per Primary service.   GLOBAL 10/09/2012: Discussed in detail with [name] Stevenson & [name] Stevenson [name] Stevenson - DNR issued Eventual SNF-rehab  [name] Mourning MD. [facility]  Pulmonary & Critical care Pager [phone] If no response call [phone]   09/23/2012 4:18 PM

## 2012-09-23 NOTE — Progress Notes (Signed)
OT Cancellation Note  Patient Details Name: [name] Stevenson Sr. MRN: [id number] DOB: 1932/07/13   Cancelled Treatment:    Reason Eval/Treat Not Completed: Attempted to see pt this AM at 11:16 and pt just back to bed and test being preformed in room. In to see pt now and patient's wife declined due to pt had just taken a Xanax and they had just recently changed out his bed for an air overlay which had made him anxious. Will re-attempt eval tomorrow.  Evette Georges [phone] 09/23/2012, 2:52 PM

## 2012-09-23 NOTE — Progress Notes (Signed)
This afternoon, patient requested and received breathing treatment and xanax for patient having some anxiety. Xanax and breathing treatment given per PRN order. Xanax and breathing treatment both were effective. Will continue to monitor to end of shift.

## 2012-09-23 NOTE — Progress Notes (Addendum)
[facility] Progress Note [place] TEAM 1 - Stepdown/ICU TEAM   [name] Pace Iracheta Sr. HQI:[id number] DOB: [birth year] DOA: 10/08/2012 PCP: [name], MD  Brief narrative: 79 year old male W/ PMH Chronic resp failure on chronic pred and oxygen), admitted 7/7 with SOB and acute on chronic respiratory failure (just discharged 7/2). Pt has been total care since dc and has been unable to ambulate as well. He developed yellow sputum and cough despite completion of anbx's after dc. EMS was called and upon their arrival he was found to have saturations in the 80's so was placed on CPAP. In the ER he was transitioned to BiPAP and was started on broad spectrum anbx's. CXR revealed persistent pleural effusions and ?? LLL infiltrate so he was started on high dose Lasix.   Assessment/Plan: Active Problems:   Acute-on-chronic respiratory failure with Hypoxia due to:   A) Obstructive chronic bronchitis with exacerbation   B)  Acute on chronic diastolic congestive heart failure -clinically does not appear to have focal pneumonia nor CHF -suspect had acute flare of DHF which responded to Lasix since now has successfully weaned from BiPAP noting pt had minimal UOP despite high dose Lasix- he actually appears dry now (see below) -abx  and Lasix were dc'ed on 7/8 per step down rounding team -continue to hold off lasix, cr leveling off, follow -follow and taper steroids- was on prednisone 10 mg daily at home -pulmonary following for further recs -as recommended per pulm will resume primaxin to cover for ESBL Ecoli cultured from BAL/recent pna during recent hospitalization although it is noted that he completed 10days of primaxin 6/30   -suspect leukocytosis from steroids - no fever, follow and recheck -cont Diflucan since recent broad spectrum anbx's and high risk for candida -pt very deconditioned, PT recommending SNF   CKD (chronic kidney disease), stage III -BUN/Scr worsened with Lasix so it was  dc'ed   and pt gently hydrated on 7/8 - cr leveling off today , follow -as above continue holding lasix for today and follow resume in am as appropriate   Right upper extremity DVT -Continue heparin IV since CKD- add coumadin per pharmacy -doppler of legs neg for DVT    ?Dysphagia/Deconditioning -wife states poor intake pre admit - ST for swallow eval -PT recommending SNF     HTN (hypertension) -BP stable, continue CCB and BB    Diabetes type 2, uncontrolled -was worsened by steroid dose, follow with tapering off -cont SSI and increase lantus to 8u  follow and adjust dose based on CBG and PO intake    Atrial fibrillation -rate controlled with meds -cards following    Dehydration with hyponatremia -continue holding off lasix for today, follow recheck in am -urine na <10 in 7/8, also check urine osmo    CARCINOMA, LUNG, SQUAMOUS CELL  -tx'd 2006    CORONARY ARTERY DISEASE-nonobstructive disease 2006  catheterization  -no sx's    OSA (obstructive sleep apnea) -supplemental o2, pulm following for further recs    Hypoalbuminemia -contributing factor to extremity edema- albumin 1.6 -nutrition consulted 7/8   Constipation -SMOG enema and follow  DVT prophylaxis: SCDs Code Status: DO NOT RESUSCITATE Family Communication: Patient and wife at bedside Disposition Plan: Likely to SNFwhen medically stable Isolation: Contact isolation for MRSA PCR positive status as well as recent treatment for ESBL Escherichia coli pneumonia Nutritional Status: Compromised with severe protein calorie malnutrition please see above  Consultants: Cardiology Pulmonology  Procedures: None  Antibiotics: Maxipime, Cipro and  vancomycin 7/7 >>> 7/8  HPI/Subjective: Patient alert and appropriate. Able to sit himself up in bed with min assistance. Denies chest pain . +cough, states breathing 'rough' about the same as yesterday. But states overall feels better and was able to get up with PT today. C/O  constipation and states that fleet enema has not worked so far.   Objective: Blood pressure 123/74, pulse 89, temperature 98 F (36.7 C), temperature source Oral, resp. rate 20, height 5' 6.5" (1.689 m), weight 88.6 kg (195 lb 5.2 oz), SpO2 95.00%.  Intake/Output Summary (Last 24 hours) at 09/23/12 1517 Last data filed at 09/23/12 1500  Gross per 24 hour  Intake    843 ml  Output   1550 ml  Net   -707 ml     Exam: General: No acute respiratory distress Lungs: Clear to auscultation bilaterally except for left basilar rhonchi otherwise without wheezes or crackles, 35% Ventimask Cardiovascular: AV sequential pacing with regular rate and rhythm without murmur gallop or rub normal S1 and S2, significant 2+ peripheral edema x 4 without JVD Abdomen: Nontender, nondistended, soft, bowel sounds positive, no rebound, no ascites, no appreciable mass Extremities: No significant cyanosis,+edema Neurological: Alert and oriented x 3, moves all extremities x 4 without focal neurological deficits but has diffuse generalized weakness with strength being 3/5, CN 2-12 intact  Scheduled Meds: Scheduled Meds: . amiodarone  200 mg Oral BID  . aspirin EC  81 mg Oral Daily  . atorvastatin  40 mg Oral QHS  . Chlorhexidine Gluconate Cloth  6 each Topical Q0600  . citalopram  10 mg Oral Daily  . diltiazem  180 mg Oral BID  . docusate sodium  100 mg Oral BID  . ezetimibe  10 mg Oral Daily  . feeding supplement  237 mL Oral BID BM  . fluconazole  100 mg Oral Daily  . guaiFENesin  1,200 mg Oral BID  . insulin aspart  0-9 Units Subcutaneous TID WC  . insulin glargine  3 Units Subcutaneous QHS  . magnesium oxide  400 mg Oral Daily  . methylPREDNISolone (SOLU-MEDROL) injection  60 mg Intravenous TID  . metoprolol succinate  25 mg Oral BID WC  . mineral oil  1 enema Rectal Daily  . multivitamin with minerals  1 tablet Oral Daily  . mupirocin cream   Topical BID  . mupirocin ointment  1 application Nasal  BID  . omega-3 acid ethyl esters  2 g Oral Daily  . pantoprazole  40 mg Oral Daily  . potassium chloride SA  20 mEq Oral Daily  . senna  1 tablet Oral BID  . sodium chloride  3 mL Intravenous Q12H  . sodium chloride  3 mL Intravenous Q12H  . sorbitol, milk of mag, mineral oil, glycerin (SMOG) enema  960 mL Rectal Once  . vitamin B-12  50 mcg Oral Daily  . vitamin E  400 Units Oral BID   Continuous Infusions: . heparin 800 Units/hr (09/23/12 1347)     Data Reviewed: Basic Metabolic Panel:  Recent Labs Lab 10/14/2012 1211 09/22/12 0500 09/23/12 0100  NA 131* 131* 129*  K 3.1* 3.5 3.3*  CL 85* 88* 88*  CO2 33* 28 29  GLUCOSE 194* 259* 333*  BUN 86* 95* 94*  CREATININE 2.75* 3.07* 3.12*  CALCIUM 8.6 9.0 8.7   Liver Function Tests:  Recent Labs Lab 10/14/2012 1211 09/22/12 0500  AST 31 23  ALT 37 32  ALKPHOS 110 87  BILITOT 0.5 0.4  PROT 5.9* 5.7*  ALBUMIN 2.4* 2.2*   No results found for this basename: LIPASE, AMYLASE,  in the last 168 hours No results found for this basename: AMMONIA,  in the last 168 hours CBC:  Recent Labs Lab 09/23/2012 1211 09/22/12 0500 09/23/12 0100  WBC 18.9* 14.9* 18.4*  NEUTROABS 15.6*  --   --   HGB 11.5* 10.1* 10.0*  HCT 34.1* 30.0* 29.9*  MCV 94.7 93.5 95.5  PLT 242 241 270   Cardiac Enzymes:  Recent Labs Lab 10/10/2012 1211 09/17/2012 1932 09/28/2012 2219 09/22/12 0419  TROPONINI <0.30 <0.30 <0.30 <0.30   BNP (last 3 results)  Recent Labs  09/06/12 0545 09/11/12 0500 10/15/2012 1211  PROBNP 1793.0* 1709.0* 806.5*   CBG:  Recent Labs Lab 09/22/12 1130 09/22/12 1814 09/22/12 2100 09/23/12 0606 09/23/12 1107  GLUCAP 268* 250* 262* 279* 353*    Recent Results (from the past 240 hour(s))  CULTURE, EXPECTORATED SPUTUM-ASSESSMENT     Status: None   Collection Time    09/14/12  5:24 PM      Result Value Range Status   Specimen Description SPUTUM   Final   Special Requests Normal   Final   Sputum evaluation      Final   Value: THIS SPECIMEN IS ACCEPTABLE. RESPIRATORY CULTURE REPORT TO FOLLOW.   Report Status 09/14/2012 FINAL   Final  CULTURE, RESPIRATORY (NON-EXPECTORATED)     Status: None   Collection Time    09/14/12  5:24 PM      Result Value Range Status   Specimen Description SPUTUM   Final   Special Requests NONE   Final   Gram Stain     Final   Value: NO WBC SEEN     NO SQUAMOUS EPITHELIAL CELLS SEEN     NO ORGANISMS SEEN   Culture NORMAL OROPHARYNGEAL FLORA   Final   Report Status 09/17/2012 FINAL   Final  CULTURE, BLOOD (ROUTINE X 2)     Status: None   Collection Time    10/01/2012  1:46 PM      Result Value Range Status   Specimen Description BLOOD ARM RIGHT   Final   Special Requests     Final   Value: BOTTLES DRAWN AEROBIC AND ANAEROBIC 10CCBLUE 5CCRED   Culture  Setup Time 10/08/2012 17:32   Final   Culture     Final   Value:        BLOOD CULTURE RECEIVED NO GROWTH TO DATE CULTURE WILL BE HELD FOR 5 DAYS BEFORE ISSUING A FINAL NEGATIVE REPORT   Report Status PENDING   Incomplete  CULTURE, BLOOD (ROUTINE X 2)     Status: None   Collection Time    09/26/2012  1:50 PM      Result Value Range Status   Specimen Description BLOOD HAND RIGHT   Final   Special Requests BOTTLES DRAWN AEROBIC ONLY 2.5CC   Final   Culture  Setup Time 10/08/2012 17:32   Final   Culture     Final   Value:        BLOOD CULTURE RECEIVED NO GROWTH TO DATE CULTURE WILL BE HELD FOR 5 DAYS BEFORE ISSUING A FINAL NEGATIVE REPORT   Report Status PENDING   Incomplete  MRSA PCR SCREENING     Status: Abnormal   Collection Time    10/08/2012  5:09 PM      Result Value Range Status   MRSA by PCR POSITIVE (*) NEGATIVE Final  Comment:            The GeneXpert MRSA Assay (FDA     approved for NASAL specimens     only), is one component of a     comprehensive MRSA colonization     surveillance program. It is not     intended to diagnose MRSA     infection nor to guide or     monitor treatment for     MRSA  infections.     RESULT CALLED TO, READ BACK BY AND VERIFIED WITH:     Bridget Hartshorn RN 2112 09/26/2012 A BROWNING  URINE CULTURE     Status: None   Collection Time    09/24/2012  6:20 PM      Result Value Range Status   Specimen Description URINE, CATHETERIZED   Final   Special Requests NONE   Final   Culture  Setup Time 09/17/2012 19:16   Final   Colony Count NO GROWTH   Final   Culture NO GROWTH   Final   Report Status 09/22/2012 FINAL   Final     Studies:  I have reviewed all Recent x-ray studies in detail     If 7PM-7AM, please contact night-coverage [email or web address] Password [email or web address] 09/23/2012, 3:17 PM   LOS: 2 days    Trissa Molina C,MD [phone] 09/23/2012, 3:17 PM

## 2012-09-23 NOTE — Progress Notes (Signed)
ANTICOAGULATION CONSULT NOTE - Follow Up Consult  Pharmacy Consult for heparin Indication: DVT  Labs:  Recent Labs  09/23/2012 1211 09/18/2012 1932 10/05/2012 2219 09/22/12 0419 09/22/12 0500 09/23/12 0100 09/23/12 1005  HGB 11.5*  --   --   --  10.1* 10.0*  --   HCT 34.1*  --   --   --  30.0* 29.9*  --   PLT 242  --   --   --  241 270  --   HEPARINUNFRC  --   --   --   --   --  0.63 0.65  CREATININE 2.75*  --   --   --  3.07* 3.12*  --   TROPONINI <0.30 <0.30 <0.30 <0.30  --   --   --     Assessment: 79yo male slightly supratherapeutic on heparin given low goal  Goal of Therapy:  Heparin level 0.3-0.5 units/ml   Plan:  1) Decrease heparin to 800 units / hr 2) Follow up AM heparin level, CBC  Thank you Okey Regal, PharmD [phone]  09/23/2012,12:01 PM

## 2012-09-23 NOTE — Progress Notes (Signed)
VASCULAR LAB PRELIMINARY  PRELIMINARY  PRELIMINARY  PRELIMINARY  Bilateral lower extremity venous duplex  completed.    Preliminary report:  Bilateral:  No evidence of DVT, superficial thrombosis, or Baker's Cyst.    Taylon Louison, RVT 09/23/2012, 11:39 AM

## 2012-09-23 NOTE — Progress Notes (Signed)
Inpatient Diabetes Program Recommendations  AACE/ADA: New Consensus Statement on Inpatient Glycemic Control  Target Ranges:  Prepandial:   less than 140 mg/dL      Peak postprandial:   less than 180 mg/dL (1-2 hours)      Critically ill patients:  140 - 180 mg/dL  Pager:  [phone] Hours:  8 am-10pm   Reason for Visit: Elevated glucose: 333 mg/dL   Inpatient Diabetes Program Recommendations Insulin - Basal: Increase to Lantus to home dose of 8 units  Alfredia Client PhD, RN, BC-ADM Diabetes Coordinator  Office:  [phone] Team Pager:  [phone]

## 2012-09-23 NOTE — Progress Notes (Signed)
Patient Name: [name] HECKARD Sr.      SUBJECTIVE:  resamitted with resp failure with co2 depdt COPD , possible pneumonia general weakness and chronic rate controlled afib and ? Aspiration  Feels a little better  Past Medical History  Diagnosis Date  . Stricture and stenosis of esophagus 12/07/2004    EGD done on 12/07/2004  . GERD (gastroesophageal reflux disease)   . Hypoxemia     With chronic respiratory failure  . Increased prostate specific antigen (PSA) velocity 02/26/2011  . DM (diabetes mellitus) 02/26/2011  . Gout 02/26/2011  . COPD (chronic obstructive pulmonary disease)     a. oxygen at home, 4L. b. On prednisone.  . OSA (obstructive sleep apnea) 02/26/2011    CPAP  . Paroxysmal atrial fibrillation   . Depression   . Cardiac arrest 2009    while in hospital   . Hypertension   . HTN (hypertension) 02/26/2011  . Cardiac arrest 2009    while in hospital  . Non-small cell lung cancer     a. s/p RUL lobectomy, adjuvant chemo 2006. b. New lung nodule seen 08/2012, OP f/u recommended.  . Basal cell carcinoma of nose 02/26/2011  . ETOH abuse 01/29/2012    started drinking again - hx cardiac arrest in 2009 due to dt's with organ failure  . CKD (chronic kidney disease) 02/28/2012  . Chronic diastolic CHF (congestive heart failure)     a. EF 65-70% 08/2012. b. Complex picture with ascites/anasarca/low albumin/CKD.  . Sick sinus syndrome     a. s/p Medtronic pacemaker 2010.  . Bradycardia     a. Admitted 2010 with severe bradycardia, runs of paroxysmal VT.   Marland Kitchen Paroxysmal VT     a. In 2010 in setting of marked bradycardia.  Marland Kitchen CAD (coronary artery disease)     a. Nonobstructive CAD by cath 2006.  Marland Kitchen Esophageal ulcer     a. By EGD 10/2011.  Marland Kitchen Hiatal hernia     a. By EGD 10/2011.  . Valvular heart disease     a. Mild MR/mild AI/mild AS by echo 05/2011.  Marland Kitchen Hemoptysis     a. 08/2012:  Bronchoscopy 09/01/12 did not determine a source - bronchial washings did not show malignant  cells on path. ENT did not find any pathology in the upper most respiratory/gi tract by fiberoptic laryngoscopy. A second bronchoscopy was done on 09/11/12 - this time a lesion along the RML suture line from previous lobectomy was found, but without evidence for malignancy on biopsy.   . Pneumonia     a. Hosp 08/2012 with + ESBL E coli, candida albicans on BAL tx with levaquin, primaxin, diflucan - complex hospitalization with CKD, anasarca, CHF, afib, hemoptysis.  . Fungal infection     a. Rash 08/2012 - felt to be disseminated fungal infection, also treated with Valtrex for possible buttocks shingles.  . Hypoalbuminemia     a. 08/2012.    Scheduled Meds:  Scheduled Meds: . amiodarone  200 mg Oral BID  . aspirin EC  81 mg Oral Daily  . atorvastatin  40 mg Oral QHS  . Chlorhexidine Gluconate Cloth  6 each Topical Q0600  . citalopram  10 mg Oral Daily  . diltiazem  180 mg Oral BID  . docusate sodium  100 mg Oral BID  . ezetimibe  10 mg Oral Daily  . feeding supplement  237 mL Oral BID BM  . fluconazole  100 mg Oral Daily  . guaiFENesin  1,200  mg Oral BID  . insulin aspart  0-9 Units Subcutaneous TID WC  . insulin glargine  3 Units Subcutaneous QHS  . magnesium oxide  400 mg Oral Daily  . methylPREDNISolone (SOLU-MEDROL) injection  60 mg Intravenous TID  . metoprolol succinate  25 mg Oral BID WC  . mineral oil  1 enema Rectal Daily  . multivitamin with minerals  1 tablet Oral Daily  . mupirocin cream   Topical BID  . mupirocin ointment  1 application Nasal BID  . omega-3 acid ethyl esters  2 g Oral Daily  . pantoprazole  40 mg Oral Daily  . potassium chloride SA  20 mEq Oral Daily  . senna  1 tablet Oral BID  . sodium chloride  3 mL Intravenous Q12H  . sodium chloride  3 mL Intravenous Q12H  . vitamin B-12  50 mcg Oral Daily  . vitamin E  400 Units Oral BID   Continuous Infusions: . heparin 900 Units/hr (09/23/12 0139)    PHYSICAL EXAM Filed Vitals:   09/22/12 1454 09/22/12  2135 09/22/12 2138 09/23/12 0607  BP: 126/66  144/68 133/77  Pulse: 69  70 89  Temp: 98.5 F (36.9 C)  98.5 F (36.9 C) 97.5 F (36.4 C)  TempSrc: Oral  Oral Axillary  Resp: 20  20 20   Height:      Weight:    195 lb 5.2 oz (88.6 kg)  SpO2: 100% 100% 97% 93%    Well developed and nourished on O2HENT normal Neck supple   Coarse  Regular rate and rhythm, no murmurs or gallops Abd-soft with active BS No Clubbing cyanosis min edema x right hand Skin-warm and dry A & Oriented  Grossly normal sensory and motor function   TELEMETRY: Reviewed telemetry pt in afib cvr:    Intake/Output Summary (Last 24 hours) at 09/23/12 0834 Last data filed at 09/23/12 0130  Gross per 24 hour  Intake   1020 ml  Output    850 ml  Net    170 ml    LABS: Basic Metabolic Panel:  Recent Labs Lab 09-24-2012 1211 09/22/12 0500 09/23/12 0100  NA 131* 131* 129*  K 3.1* 3.5 3.3*  CL 85* 88* 88*  CO2 33* 28 29  GLUCOSE 194* 259* 333*  BUN 86* 95* 94*  CREATININE 2.75* 3.07* 3.12*  CALCIUM 8.6 9.0 8.7   Cardiac Enzymes:  Recent Labs  September 24, 2012 1932 09/24/12 2219 09/22/12 0419  TROPONINI <0.30 <0.30 <0.30   CBC:  Recent Labs Lab 09/16/12 0920 September 24, 2012 1211 09/22/12 0500 09/23/12 0100  WBC 18.2* 18.9* 14.9* 18.4*  NEUTROABS  --  15.6*  --   --   HGB 11.4* 11.5* 10.1* 10.0*  HCT 33.4* 34.1* 30.0* 29.9*  MCV 92.0 94.7 93.5 95.5  PLT 266 242 241 270   PROTIME: No results found for this basename: LABPROT, INR,  in the last 72 hours Liver Function Tests:  Recent Labs  09-24-2012 1211 09/22/12 0500  AST 31 23  ALT 37 32  ALKPHOS 110 87  BILITOT 0.5 0.4  PROT 5.9* 5.7*  ALBUMIN 2.4* 2.2*   No results found for this basename: LIPASE, AMYLASE,  in the last 72 hours BNP: BNP (last 3 results)  Recent Labs  09/06/12 0545 09/11/12 0500 2012/09/24 1211  PROBNP 1793.0* 1709.0* 806.5*     ASSESSMENT AND PLAN:  Active Problems:   CARCINOMA, LUNG, SQUAMOUS CELL   CORONARY  ARTERY DISEASE-nonobstructive disease 2006 catheterization   Dysphagia  OSA (obstructive sleep apnea)   HTN (hypertension)   Diabetes type 2, uncontrolled   Atrial fibrillation   CKD (chronic kidney disease), stage III   Acute on chronic diastolic congestive heart failure   Acute-on-chronic respiratory failure   Obstructive chronic bronchitis with exacerbation   Dehydration with hyponatremia   Hypoalbuminemia   Hypoxia  euvoliemic  RUE DVT noted  Heparin started per PCP Not sure about right anticoagulant but w GFR at 18 and [place], hep and coumadin prob best bet Continue current meds  Signed, Sherryl Manges MD  09/23/2012

## 2012-09-23 NOTE — Evaluation (Signed)
Physical Therapy Evaluation Patient Details Name: [name] Stevenson. MRN: [id number] DOB: 1932/10/29 Today's Date: 09/23/2012 Time: 4098-1191 PT Time Calculation (min): 30 min  PT Assessment / Plan / Recommendation History of Present Illness  acute/chronic resp failure, CHF, new R UE blood clot  Clinical Impression  Pt extremely deconditioned and requiring 35% venti mask for rest and mobility. Pt unsafe to d/c home at this time due to increased falls risk and pt's spouse inability to physically assist pt at home. Pt to benefit from ST-SNF to address weakness, balance, impairement and decreased activity tolerance to achieve supervision level of function for safe transition home.    PT Assessment  Patient needs continued PT services    Follow Up Recommendations  SNF    Does the patient have the potential to tolerate intense rehabilitation      Barriers to Discharge Decreased caregiver support pt wife unable to physical assist pt. pt fell on day of discharge while getting into bed.    Equipment Recommendations  None recommended by PT    Recommendations for Other Services     Frequency Min 3X/week    Precautions / Restrictions Precautions Precautions: Fall Precaution Comments: pt with fall at home on 7/2 (day of previous d/c). Pt also on venti mask at 35% Restrictions Weight Bearing Restrictions: No   Pertinent Vitals/Pain Pt denies pain      Mobility  Bed Mobility Bed Mobility: Supine to Sit Supine to Sit: 4: Min guard;With rails;HOB elevated Sitting - Scoot to Edge of Bed: 4: Min guard Details for Bed Mobility Assistance: increased time, + SOB with all mobiltiy Transfers Transfers: Sit to Stand;Stand to Sit Sit to Stand: With upper extremity assist;From bed;4: Min assist Stand to Sit: 4: Min assist;With upper extremity assist;To chair/3-in-1 (assist to control descent) Details for Transfer Assistance: v/c's for hand placement, assist to achieve full upright  standing Ambulation/Gait Ambulation/Gait Assistance: 4: Min assist Ambulation Distance (Feet): 10 Feet Assistive device: Rolling walker Ambulation/Gait Assistance Details: cardiopulmonary condition limits ambulation tolerance + SOB, SpO2 at >88% venti mask at 35%. Pt becomes weak in knees and requires to sit quickly Gait Pattern: Step-through pattern;Decreased stride length;Shuffle Gait velocity: decr    Exercises     PT Diagnosis: Difficulty walking;Generalized weakness  PT Problem List: Decreased strength;Decreased activity tolerance;Decreased balance;Decreased mobility;Cardiopulmonary status limiting activity PT Treatment Interventions: DME instruction;Gait training;Patient/family education;Functional mobility training;Therapeutic activities;Therapeutic exercise;Balance training     PT Goals(Current goals can be found in the care plan section) Acute Rehab PT Goals PT Goal Formulation: With patient/family Time For Goal Achievement: 09/16/2012 Potential to Achieve Goals: Good Additional Goals Additional Goal #1: Pt to tolerate 20 reps of LE ther ex program  Visit Information  Last PT Received On: 09/23/12 Assistance Needed: +2 (flor lines/chair follow) History of Present Illness: acute/chronic resp failure, CHF, new R UE blood clot       Prior Functioning  Home Living Family/patient expects to be discharged to:: Skilled nursing facility Communication Communication: HOH Dominant Hand: Right    Cognition  Cognition Arousal/Alertness: Awake/alert Behavior During Therapy: WFL for tasks assessed/performed Overall Cognitive Status: Within Functional Limits for tasks assessed    Extremity/Trunk Assessment Upper Extremity Assessment Upper Extremity Assessment: Generalized weakness;RUE deficits/detail RUE Deficits / Details: shld flex to 80 dg RUE:  (noted edema) Lower Extremity Assessment Lower Extremity Assessment: Generalized weakness Cervical / Trunk Assessment Cervical /  Trunk Assessment: Normal   Balance    End of Session PT - End of Session Equipment  Utilized During Treatment: Gait belt;Oxygen Activity Tolerance: Patient limited by fatigue Patient left: in chair;with call bell/phone within reach;with family/visitor present Nurse Communication: Mobility status  GP     Marcene Brawn 09/23/2012, 10:22 AM  Lewis Shock, PT, DPT Pager #: [phone] Office #: [phone]

## 2012-09-24 DIAGNOSIS — R5381 Other malaise: Secondary | ICD-10-CM

## 2012-09-24 DIAGNOSIS — I10 Essential (primary) hypertension: Secondary | ICD-10-CM

## 2012-09-24 LAB — CBC
Hemoglobin: 10.7 g/dL — ABNORMAL LOW (ref 13.0–17.0)
MCH: 32 pg (ref 26.0–34.0)
MCHC: 33.8 g/dL (ref 30.0–36.0)
Platelets: 324 10*3/uL (ref 150–400)
RDW: 18.7 % — ABNORMAL HIGH (ref 11.5–15.5)

## 2012-09-24 LAB — PROTIME-INR
INR: 1.09 (ref 0.00–1.49)
Prothrombin Time: 13.9 seconds (ref 11.6–15.2)

## 2012-09-24 LAB — BASIC METABOLIC PANEL
BUN: 98 mg/dL — ABNORMAL HIGH (ref 6–23)
Calcium: 9.1 mg/dL (ref 8.4–10.5)
Creatinine, Ser: 2.48 mg/dL — ABNORMAL HIGH (ref 0.50–1.35)
GFR calc Af Amer: 27 mL/min — ABNORMAL LOW (ref >90)
GFR calc non Af Amer: 23 mL/min — ABNORMAL LOW (ref >90)
Glucose, Bld: 219 mg/dL — ABNORMAL HIGH (ref 70–99)
Potassium: 3.6 mEq/L (ref 3.5–5.1)

## 2012-09-24 LAB — GLUCOSE, CAPILLARY: Glucose-Capillary: 277 mg/dL — ABNORMAL HIGH (ref 70–99)

## 2012-09-24 MED ORDER — ALBUTEROL SULFATE (5 MG/ML) 0.5% IN NEBU
2.5000 mg | INHALATION_SOLUTION | RESPIRATORY_TRACT | Status: DC
  Administered 2012-09-24 – 2012-09-28 (×24): 2.5 mg via RESPIRATORY_TRACT
  Filled 2012-09-24 (×27): qty 0.5

## 2012-09-24 MED ORDER — WARFARIN - PHARMACIST DOSING INPATIENT
Freq: Every day | Status: DC
  Administered 2012-09-24: 18:00:00

## 2012-09-24 MED ORDER — WARFARIN VIDEO
Freq: Once | Status: AC
  Administered 2012-09-24: 12:00:00

## 2012-09-24 MED ORDER — WARFARIN SODIUM 7.5 MG PO TABS
7.5000 mg | ORAL_TABLET | Freq: Once | ORAL | Status: AC
  Administered 2012-09-24: 7.5 mg via ORAL
  Filled 2012-09-24: qty 1

## 2012-09-24 MED ORDER — SODIUM CHLORIDE 0.9 % IV SOLN
250.0000 mg | Freq: Four times a day (QID) | INTRAVENOUS | Status: DC
  Filled 2012-09-24 (×3): qty 250

## 2012-09-24 MED ORDER — SODIUM CHLORIDE 0.9 % IV SOLN
250.0000 mg | Freq: Three times a day (TID) | INTRAVENOUS | Status: DC
  Administered 2012-09-24: 250 mg via INTRAVENOUS
  Filled 2012-09-24 (×2): qty 250

## 2012-09-24 MED ORDER — IPRATROPIUM BROMIDE 0.02 % IN SOLN
0.5000 mg | RESPIRATORY_TRACT | Status: DC
  Administered 2012-09-24 – 2012-09-28 (×24): 0.5 mg via RESPIRATORY_TRACT
  Filled 2012-09-24 (×29): qty 2.5

## 2012-09-24 MED ORDER — INSULIN GLARGINE 100 UNIT/ML ~~LOC~~ SOLN
8.0000 [IU] | Freq: Every day | SUBCUTANEOUS | Status: DC
  Administered 2012-09-24 – 2012-09-27 (×4): 8 [IU] via SUBCUTANEOUS
  Filled 2012-09-24 (×5): qty 0.08

## 2012-09-24 MED ORDER — SODIUM CHLORIDE 0.9 % IV SOLN
250.0000 mg | Freq: Once | INTRAVENOUS | Status: AC
  Administered 2012-09-24: 250 mg via INTRAVENOUS
  Filled 2012-09-24: qty 250

## 2012-09-24 MED ORDER — COUMADIN BOOK
Freq: Once | Status: AC
  Administered 2012-09-24: 12:00:00
  Filled 2012-09-24: qty 1

## 2012-09-24 NOTE — Evaluation (Signed)
Clinical/Bedside Swallow Evaluation Patient Details  Name: [name] DEIS Sr. MRN: [id number] Date of Birth: 12-04-1932  Today's Date: 09/24/2012 Time: 1100-1145 SLP Time Calculation (min): 45 min  Past Medical History:  Past Medical History  Diagnosis Date  . Stricture and stenosis of esophagus 12/07/2004    EGD done on 12/07/2004  . GERD (gastroesophageal reflux disease)   . Hypoxemia     With chronic respiratory failure  . Increased prostate specific antigen (PSA) velocity 02/26/2011  . DM (diabetes mellitus) 02/26/2011  . Gout 02/26/2011  . COPD (chronic obstructive pulmonary disease)     a. oxygen at home, 4L. b. On prednisone.  . OSA (obstructive sleep apnea) 02/26/2011    CPAP  . Paroxysmal atrial fibrillation   . Depression   . Cardiac arrest 2009    while in hospital   . Hypertension   . HTN (hypertension) 02/26/2011  . Cardiac arrest 2009    while in hospital  . Non-small cell lung cancer     a. s/p RUL lobectomy, adjuvant chemo 2006. b. New lung nodule seen 08/2012, OP f/u recommended.  . Basal cell carcinoma of nose 02/26/2011  . ETOH abuse 01/29/2012    started drinking again - hx cardiac arrest in 2009 due to dt's with organ failure  . CKD (chronic kidney disease) 02/28/2012  . Chronic diastolic CHF (congestive heart failure)     a. EF 65-70% 08/2012. b. Complex picture with ascites/anasarca/low albumin/CKD.  . Sick sinus syndrome     a. s/p Medtronic pacemaker 2010.  . Bradycardia     a. Admitted 2010 with severe bradycardia, runs of paroxysmal VT.   Marland Kitchen Paroxysmal VT     a. In 2010 in setting of marked bradycardia.  Marland Kitchen CAD (coronary artery disease)     a. Nonobstructive CAD by cath 2006.  Marland Kitchen Esophageal ulcer     a. By EGD 10/2011.  Marland Kitchen Hiatal hernia     a. By EGD 10/2011.  . Valvular heart disease     a. Mild MR/mild AI/mild AS by echo 05/2011.  Marland Kitchen Hemoptysis     a. 08/2012:  Bronchoscopy 09/01/12 did not determine a source - bronchial washings did not show  malignant cells on path. ENT did not find any pathology in the upper most respiratory/gi tract by fiberoptic laryngoscopy. A second bronchoscopy was done on 09/11/12 - this time a lesion along the RML suture line from previous lobectomy was found, but without evidence for malignancy on biopsy.   . Pneumonia     a. Hosp 08/2012 with + ESBL E coli, candida albicans on BAL tx with levaquin, primaxin, diflucan - complex hospitalization with CKD, anasarca, CHF, afib, hemoptysis.  . Fungal infection     a. Rash 08/2012 - felt to be disseminated fungal infection, also treated with Valtrex for possible buttocks shingles.  . Hypoalbuminemia     a. 08/2012.   Past Surgical History:  Past Surgical History  Procedure Laterality Date  . Lung lobectomy      x2 lobes on RT lung  . Knee arthroscopy      bilateral  . Pace maker    . Tonsillectomy    . Hernia repair      x2, esophageal  . Cardiac catheterization    . Esophagogastroduodenoscopy  10/25/2011    Procedure: ESOPHAGOGASTRODUODENOSCOPY (EGD);  Surgeon: Hart Carwin, MD;  Location: [facility];  Service: Endoscopy;  Laterality: N/A;  . Video bronchoscopy N/A 09/01/2012    Procedure: VIDEO BRONCHOSCOPY  WITHOUT FLUORO;  Surgeon: Storm Frisk, MD;  Location[facility] ENDOSCOPY;  Service: Cardiopulmonary;  Laterality: N/A;  . Video bronchoscopy Bilateral 09/11/2012    Procedure: VIDEO BRONCHOSCOPY WITHOUT FLUORO;  Surgeon: Leslye Peer, MD;  Location: [facility] ENDOSCOPY;  Service: Cardiopulmonary;  Laterality: Bilateral;   HPI:  [name] VALENTINE Sr. is a 79 y.o. male recently discharged after a prolonged hospitalization, with a very complicated Past Medical History of severe COPD, recent ESBL pneumonia, hemoptysis, chronic diastolic heart failure, atrial fibrillation-currently not on anticoagulation who presents today with the above noted complaint. BSE ordered to assess risk for aspiration.    Assessment / Plan / Recommendation Clinical Impression  No s/s of  aspiration noted with PO trials.   Patient requiring increased supplemental oxygen with use of Venturi Mask.  Oxygen saturations monitored during PO trials with mask removed briefly for PO trials. Decrease in oxygen saturations to 91 and 88 percent during oral prep stage with solids with obvious decreased endurance.  Due to overall respiratory status and decreased reserve, increasing risk for aspiration, recommend to downgrade diet consistency to dysphagia 1 ( puree) in small  amounts and continue thin liquids.    Diagnostic treatment completed following evaluation focusing on providing skilled education to caregivers on diet recommendations and strategies.  F/u education for spouse recommended.   Recommend full supervision with all meals and stop PO's if coughing occurs and contact ST.   ST to follow closely in acute care setting for diet tolerance.      Aspiration Risk  Moderate    Diet Recommendation Thin liquid;Dysphagia 1 (Puree)   Liquid Administration via: Straw;Cup Medication Administration: Crushed with puree Supervision: Full supervision/cueing for compensatory strategies;Staff feed patient Compensations: Slow rate;Small sips/bites Postural Changes and/or Swallow Maneuvers: Seated upright 90 degrees;Upright 30-60 min after meal    Other  Recommendations Oral Care Recommendations: Oral care QID Other Recommendations: Clarify dietary restrictions   Follow Up Recommendations  Skilled Nursing facility    Frequency and Duration min 2x/week  2 weeks       SLP Swallow Goals Patient will utilize recommended strategies during swallow to increase swallowing safety with: Maximum assistance   Swallow Study Prior Functional Status   Lived at home with spouse    General Date of Onset: 09/23/12 HPI: [name] POTEMPA Sr. is a 79 y.o. male recently discharged after a prolonged hospitalization, with a very complicated Past Medical History of severe COPD, recent ESBL pneumonia, hemoptysis,  chronic diastolic heart failure, atrial fibrillation-currently not on anticoagulation who presents today with the above noted complaint. Type of Study: Bedside swallow evaluation Previous Swallow Assessment: BSE 6/25 with no f/u  Diet Prior to this Study: Regular;Thin liquids Temperature Spikes Noted: No Respiratory Status: Supplemental O2 delivered via (comment) (venturi mask ) History of Recent Intubation: No Behavior/Cognition: Alert;Cooperative;Pleasant mood Oral Cavity - Dentition: Missing dentition Self-Feeding Abilities: Total assist Patient Positioning: Upright in chair Baseline Vocal Quality: Hoarse Volitional Cough: Strong Volitional Swallow: Able to elicit    Oral/Motor/Sensory Function Overall Oral Motor/Sensory Function: Appears within functional limits for tasks assessed   Ice Chips Ice chips: Not tested   Thin Liquid Thin Liquid: Within functional limits Presentation: Cup;Straw    Nectar Thick Nectar Thick Liquid: Not tested   Honey Thick Honey Thick Liquid: Not tested   Puree Puree: Within functional limits Presentation: Spoon   Solid   GO    Solid: Impaired Pharyngeal Phase Impairments: Change in Vital Signs Other Comments: decreased reserve  Moreen Fowler MS, CCC-SLP [phone] [facility] 09/24/2012,12:23 PM

## 2012-09-24 NOTE — Evaluation (Signed)
Occupational Therapy Evaluation Patient Details Name: [name] GALENTINE Sr. MRN: [id number] DOB: [birth year] Today's Date: 09/24/2012 Time: 6578-4696 OT Time Calculation (min): 35 min  OT Assessment / Plan / Recommendation History of present illness acute/chronic resp failure, CHF, new R UE blood clot   Clinical Impression   Pt admitted with above. Pt currently with functional limitations due to the deficits listed below (see OT Problem List). Pt will benefit from skilled OT to increase their safety and independence with ADL and functional mobility for ADL to facilitate discharge to venue listed below.       OT Assessment  Patient needs continued OT Services    Follow Up Recommendations  SNF       Equipment Recommendations  None recommended by OT       Frequency  Min 2X/week    Precautions / Restrictions Precautions Precautions: Fall Precaution Comments: pt with fall at home on 7/2 (day of previous d/c). Pt also on venti mask at 35% Restrictions Weight Bearing Restrictions: No   Pertinent Vitals/Pain Dropped into the 80's on venturi mask at 35%/10 liters, up to 90's within 1 minute of sitting down to rest.    ADL  Toilet Transfer: Minimal assistance (with additional +1 for lines and tubes) Toilet Transfer Method: Sit to stand (ambulating) Equipment Used: Rolling walker;Gait belt (Venturi mask 35%/10 liters (9 not available on portable)) Transfers/Ambulation Related to ADLs: Min A sit to stand and stand to sit, Mod A at time for ambulation due to tendency to have a mild posterior lean. Pt ambulated 14 feet then 32 feet ADL Comments: Wife normally A pt with all BADLs due to it is just too tasking on him and his breathing    OT Diagnosis: Generalized weakness  OT Problem List: Decreased strength;Cardiopulmonary status limiting activity;Impaired balance (sitting and/or standing);Decreased knowledge of use of DME or AE;Increased edema;Impaired UE functional use OT Treatment  Interventions: Therapeutic exercise;Therapeutic activities;DME and/or AE instruction;Patient/family education;Balance training   OT Goals(Current goals can be found in the care plan section) Acute Rehab OT Goals Patient Stated Goal: home OT Goal Formulation: With patient Time For Goal Achievement: 10/01/12 Potential to Achieve Goals: Good  Visit Information  Last OT Received On: 09/24/12 Assistance Needed: +2 History of Present Illness: acute/chronic resp failure, CHF, new R UE blood clot       Prior Functioning     Home Living Family/patient expects to be discharged to:: Skilled nursing facility         Vision/Perception Vision - History Baseline Vision: Wears glasses all the time Patient Visual Report: No change from baseline   Cognition  Cognition Arousal/Alertness: Awake/alert Behavior During Therapy: WFL for tasks assessed/performed Overall Cognitive Status: Within Functional Limits for tasks assessed [facility])    Extremity/Trunk Assessment Upper Extremity Assessment Upper Extremity Assessment: RUE deficits/detail RUE Deficits / Details: Due to newly diagnosed DVT, edematous. Advised that he needs to try and move the arm but not ro resistance just AROM     Mobility Bed Mobility Bed Mobility: Sitting - Scoot to Edge of Bed;Rolling Right;Right Sidelying to Sit Rolling Right: 4: Min guard;With rail Right Sidelying to Sit: 4: Min assist;HOB flat;With rails Sitting - Scoot to Edge of Bed: 4: Min guard Transfers Transfers: Sit to Stand;Stand to Sit Sit to Stand: 4: Min assist;With upper extremity assist;From bed Stand to Sit: 4: Min assist;With upper extremity assist;With armrests;To chair/3-in-1 Details for Transfer Assistance: VCs for safe hand placement  End of Session OT - End of Session Equipment Utilized During Treatment: Gait belt;Rolling walker Activity Tolerance:  (limited by DOE; however better than yesterday) Patient left: in chair;with  family/visitor present    Evette Georges [phone] 09/24/2012, 10:58 AM

## 2012-09-24 NOTE — Consult Note (Signed)
Physical Medicine and Rehabilitation Consult Reason for Consult: Deconditioning, FTT Referring Physician: Dr. Vassie Loll   HPI: [name] Newcomer Sr. is a 79 y.o. male with complex PMH including chronic respiratory failure on home O2 secondary to COPD on chronic prednisone, PAF, HTN, chronic diastolic CHF (EF 21-30% in 08/2012), SSS s/p pacemaker, nonobstructive CAD 2006, DM, CKD, EtOH use (prior h/o DTs), lung cancer, OSA, and recent prolonged hopsitalization  06/12-09/16/12 for PNA/CHF/COPD/hemoptysis. Patient sustained a fall at home on 07/03 with progressive weakness and inability to ambulate. He was admitted on 09/25/2012 with SOB with hypoxia, weakness and lethargy. Patient with persistent effusions and concerns of aspiration due to h/o esophageal stricture. He was treated dose of lasix, broad spectrum antibiotics and BIPAP. Cardiology consulted for input and recommended close monitoring of I/Os and doubted fluid overload as cause of respiratory distress.  Patient with RUE DVT and started on coumadin for treatment. Antibiotics changed to imipenem to cover ESBL.  Patient significantly deconditioned and limited by cardiopulmonary status. Family requesting CIR.   Patient still on 9 L facial mask. Complaints of shortness of breath with activity  Review of Systems  Constitutional: Positive for malaise/fatigue.  HENT: Positive for hearing loss.   Eyes: Positive for blurred vision.       Blind in right eye and limited vision left eye with loss of central vision due to macular degeneration.  Respiratory: Positive for shortness of breath.   Cardiovascular: Positive for leg swelling.  Gastrointestinal: Positive for heartburn and nausea.  Neurological: Positive for weakness.   Past Medical History  Diagnosis Date  . Stricture and stenosis of esophagus 12/07/2004    EGD done on 12/07/2004  . GERD (gastroesophageal reflux disease)   . Hypoxemia     With chronic respiratory failure  . Increased prostate specific  antigen (PSA) velocity 02/26/2011  . DM (diabetes mellitus) 02/26/2011  . Gout 02/26/2011  . COPD (chronic obstructive pulmonary disease)     a. oxygen at home, 4L. b. On prednisone.  . OSA (obstructive sleep apnea) 02/26/2011    CPAP  . Paroxysmal atrial fibrillation   . Depression   . Cardiac arrest 2009    while in hospital   . Hypertension   . HTN (hypertension) 02/26/2011  . Cardiac arrest 2009    while in hospital  . Non-small cell lung cancer     a. s/p RUL lobectomy, adjuvant chemo 2006. b. New lung nodule seen 08/2012, OP f/u recommended.  . Basal cell carcinoma of nose 02/26/2011  . ETOH abuse 01/29/2012    started drinking again - hx cardiac arrest in 2009 due to dt's with organ failure  . CKD (chronic kidney disease) 02/28/2012  . Chronic diastolic CHF (congestive heart failure)     a. EF 65-70% 08/2012. b. Complex picture with ascites/anasarca/low albumin/CKD.  . Sick sinus syndrome     a. s/p [employer] pacemaker 2010.  . Bradycardia     a. Admitted 2010 with severe bradycardia, runs of paroxysmal VT.   Marland Kitchen Paroxysmal VT     a. In 2010 in setting of marked bradycardia.  Marland Kitchen CAD (coronary artery disease)     a. Nonobstructive CAD by cath 2006.  Marland Kitchen Esophageal ulcer     a. By EGD 10/2011.  Marland Kitchen Hiatal hernia     a. By EGD 10/2011.  . Valvular heart disease     a. Mild MR/mild AI/mild AS by echo 05/2011.  Marland Kitchen Hemoptysis     a. 08/2012:  Bronchoscopy 09/01/12 did  not determine a source - bronchial washings did not show malignant cells on path. ENT did not find any pathology in the upper most respiratory/gi tract by fiberoptic laryngoscopy. A second bronchoscopy was done on 09/11/12 - this time a lesion along the RML suture line from previous lobectomy was found, but without evidence for malignancy on biopsy.   . Pneumonia     a. Hosp 08/2012 with + ESBL E coli, candida albicans on BAL tx with levaquin, primaxin, diflucan - complex hospitalization with CKD, anasarca, CHF, afib,  hemoptysis.  . Fungal infection     a. Rash 08/2012 - felt to be disseminated fungal infection, also treated with Valtrex for possible buttocks shingles.  . Hypoalbuminemia     a. 08/2012.   Past Surgical History  Procedure Laterality Date  . Lung lobectomy      x2 lobes on RT lung  . Knee arthroscopy      bilateral  . Pace maker    . Tonsillectomy    . Hernia repair      x2, esophageal  . Cardiac catheterization    . Esophagogastroduodenoscopy  10/25/2011    Procedure: ESOPHAGOGASTRODUODENOSCOPY (EGD);  Surgeon: Hart Carwin, MD;  Location: [facility];  Service: Endoscopy;  Laterality: N/A;  . Video bronchoscopy N/A 09/01/2012    Procedure: VIDEO BRONCHOSCOPY WITHOUT FLUORO;  Surgeon: Storm Frisk, MD;  Location: [facility] ENDOSCOPY;  Service: Cardiopulmonary;  Laterality: N/A;  . Video bronchoscopy Bilateral 09/11/2012    Procedure: VIDEO BRONCHOSCOPY WITHOUT FLUORO;  Surgeon: Leslye Peer, MD;  Location: [facility] ENDOSCOPY;  Service: Cardiopulmonary;  Laterality: Bilateral;   Family History  Problem Relation Age of Onset  . Heart disease Father   . Lung cancer Mother   . Cancer Other     lung cancer  . Heart disease Other   . Hypertension Other    Social History: Married. Was independent prior to June admission. Was homebound and sedentary--no AD. He  reports that he quit smoking about 16 years ago. His smoking use included Cigarettes. He has a 75 pack-year smoking history. His smokeless tobacco use includes Chew. He reports that used to drink alcohol--quit 4-5 years ago. He reports that he does not use illicit drugs.   Allergies: No Known Allergies  Medications Prior to Admission  Medication Sig Dispense Refill  . albuterol (PROVENTIL HFA;VENTOLIN HFA) 108 (90 BASE) MCG/ACT inhaler Inhale 2 puffs into the lungs every 6 (six) hours as needed for wheezing.  1 Inhaler  6  . ALPRAZolam (XANAX) 0.5 MG tablet Take 1 tablet (0.5 mg total) by mouth 3 (three) times daily as needed. For  anxiety/sleeplessness.  90 tablet  3  . amiodarone (PACERONE) 200 MG tablet Take 1 tablet (200 mg total) by mouth 2 (two) times daily.  60 tablet  3  . arformoterol (BROVANA) 15 MCG/2ML NEBU Take 2 mLs (15 mcg total) by nebulization 2 (two) times daily.  120 mL  3  . aspirin 81 MG chewable tablet Chew 81 mg by mouth daily.      Marland Kitchen atorvastatin (LIPITOR) 40 MG tablet Take 40 mg by mouth at bedtime.      . budesonide (PULMICORT) 0.25 MG/2ML nebulizer solution Take 4 mLs (0.5 mg total) by nebulization 2 (two) times daily.  60 mL  12  . citalopram (CELEXA) 10 MG tablet Take 1 tablet (10 mg total) by mouth daily.  90 tablet  3  . diltiazem (CARDIZEM CD) 180 MG 24 hr capsule Take 1 capsule (  180 mg total) by mouth 2 (two) times daily.  60 capsule  3  . ezetimibe (ZETIA) 10 MG tablet Take 10 mg by mouth daily.      . feeding supplement (ENSURE COMPLETE) LIQD Take 237 mLs by mouth daily.  60 Bottle  6  . fish oil-omega-3 fatty acids 1000 MG capsule Take 2 g by mouth daily.        . fluconazole (DIFLUCAN) 100 MG tablet Take 1 tablet (100 mg total) by mouth daily.  5 tablet  0  . furosemide (LASIX) 80 MG tablet Take 2 tablets (160 mg total) by mouth every 8 (eight) hours.  120 tablet  1  . guaiFENesin (MUCINEX) 600 MG 12 hr tablet Take 2 tablets (1,200 mg total) by mouth 2 (two) times daily.      . hydroxypropyl methylcellulose (ISOPTO TEARS) 2.5 % ophthalmic solution Place 2 drops into both eyes as needed. For dry/irritated eyes.      . insulin aspart (NOVOLOG) 100 UNIT/ML injection Inject 0-9 Units into the skin 3 (three) times daily with meals. 70-120=0units; 121-150=1units; 151-200=2 units; 201-250=3 units; 251-300=5 units; 301-350= 7 units; 351-400= 9 units      . insulin glargine (LANTUS) 100 UNIT/ML injection Inject 0.08 mLs (8 Units total) into the skin at bedtime.  10 mL  12  . levalbuterol (XOPENEX) 0.63 MG/3ML nebulizer solution Take 3 mLs (0.63 mg total) by nebulization every 3 (three) hours as  needed for wheezing or shortness of breath.  3 mL  12  . magnesium oxide (DIASENSE MAGNESIUM) 400 (241.3 MG) MG tablet Take 1 tablet (400 mg total) by mouth daily.  90 tablet  3  . metoprolol succinate (TOPROL-XL) 25 MG 24 hr tablet Take 1 tablet (25 mg total) by mouth 2 (two) times daily with a meal.  60 tablet  3  . Multiple Vitamins-Minerals (EYE VITAMINS) TABS Take 1 tablet by mouth 2 (two) times daily.        . multivitamin (THERAGRAN) per tablet Take 1 tablet by mouth daily.        . nitroGLYCERIN (NITROSTAT) 0.4 MG SL tablet Place 1 tablet (0.4 mg total) under the tongue every 5 (five) minutes as needed for chest pain.  25 tablet  prn  . omeprazole (PRILOSEC) 20 MG capsule Take 20 mg by mouth daily.      Marland Kitchen oxycodone (OXY-IR) 5 MG capsule Take 5 mg by mouth every 4 (four) hours as needed for pain.      . potassium chloride SA (K-DUR,KLOR-CON) 20 MEQ tablet Take 1 tablet (20 mEq total) by mouth daily.  90 tablet  3  . predniSONE (DELTASONE) 5 MG tablet Take 2 tablets (10 mg total) by mouth daily.  60 tablet  0  . senna-docusate (SENOKOT-S) 8.6-50 MG per tablet Take 3 tablets by mouth at bedtime.      . temazepam (RESTORIL) 30 MG capsule Take 1 capsule (30 mg total) by mouth at bedtime.  90 capsule  1  . tiotropium (SPIRIVA) 18 MCG inhalation capsule Place 1 capsule (18 mcg total) into inhaler and inhale daily.  90 capsule  3  . vitamin B-12 (CYANOCOBALAMIN) 100 MCG tablet Take 50 mcg by mouth daily.        . vitamin E 600 UNIT capsule Take 600 Units by mouth 2 (two) times daily.        . Insulin Syringe-Needle U-100 (B-D INSULIN SYRINGE) 31G X 5/16" 0.3 ML MISC 1 Syringe by Does not apply route  4 (four) times daily.  100 each  5    Home: Home Living Family/patient expects to be discharged to:: Skilled nursing facility Living Arrangements: Spouse/significant other  Functional History:   Functional Status:  Mobility: Bed Mobility Bed Mobility: Supine to Sit Supine to Sit: 4: Min  guard;With rails;HOB elevated Sitting - Scoot to Edge of Bed: 4: Min guard Transfers Transfers: Sit to Stand;Stand to Sit Sit to Stand: With upper extremity assist;From bed;4: Min assist Stand to Sit: 4: Min assist;With upper extremity assist;To chair/3-in-1 (assist to control descent) Ambulation/Gait Ambulation/Gait Assistance: 4: Min assist Ambulation Distance (Feet): 10 Feet Assistive device: Rolling walker Ambulation/Gait Assistance Details: cardiopulmonary condition limits ambulation tolerance + SOB, SpO2 at >88% venti mask at 35%. Pt becomes weak in knees and requires to sit quickly Gait Pattern: Step-through pattern;Decreased stride length;Shuffle Gait velocity: decr    ADL:    Cognition: Cognition Overall Cognitive Status: Within Functional Limits for tasks assessed Orientation Level: Oriented X4 Cognition Arousal/Alertness: Awake/alert Behavior During Therapy: WFL for tasks assessed/performed Overall Cognitive Status: Within Functional Limits for tasks assessed  Blood pressure 149/70, pulse 70, temperature 96.8 F (36 C), temperature source Axillary, resp. rate 18, height 5' 6.5" (1.689 m), weight 86.138 kg (189 lb 14.4 oz), SpO2 98.00%.  Physical Exam  Nursing note and vitals reviewed. Constitutional: He is oriented to person, place, and time. He appears well-developed and well-nourished. He has a sickly appearance. No distress.  HENT:  Head: Normocephalic and atraumatic.  Eyes: Pupils are equal, round, and reactive to light.  Neck: Normal range of motion. Neck supple.  Cardiovascular: Normal rate.  An irregular rhythm present.  Pulmonary/Chest: He is in respiratory distress (dyspneic with conversation.). He has wheezes.  Abdominal: Soft. Bowel sounds are normal. He exhibits no distension.  Musculoskeletal: He exhibits edema.  Neurological: He is alert and oriented to person, place, and time.  Speech clear. Follows basic commands without difficulty.   Skin: Skin  is warm and dry.  Psychiatric: He has a normal mood and affect. His behavior is normal. Thought content normal.   motor strength is 5/5 in bilateral deltoid, bicep, tricep, grip 4-/5 in bilateral hip flexors knee extensors ankle dorsiflexors and plantar flexor Sensation normal in the upper and lower limbs  Results for orders placed during the hospital encounter of 10/02/2012 (from the past 24 hour(s))  GLUCOSE, CAPILLARY     Status: Abnormal   Collection Time    09/23/12 11:07 AM      Result Value Range   Glucose-Capillary 353 (*) 70 - 99 mg/dL  GLUCOSE, CAPILLARY     Status: Abnormal   Collection Time    09/23/12  4:49 PM      Result Value Range   Glucose-Capillary 297 (*) 70 - 99 mg/dL   Comment 1 Notify RN    GLUCOSE, CAPILLARY     Status: Abnormal   Collection Time    09/23/12  9:04 PM      Result Value Range   Glucose-Capillary 268 (*) 70 - 99 mg/dL  OSMOLALITY, URINE     Status: Abnormal   Collection Time    09/24/12  2:30 AM      Result Value Range   Osmolality, Ur 362 (*) 390 - 1090 mOsm/kg  GLUCOSE, CAPILLARY     Status: Abnormal   Collection Time    09/24/12  6:15 AM      Result Value Range   Glucose-Capillary 188 (*) 70 - 99 mg/dL   No results  found.  Assessment/Plan: Diagnosis: Severe pulmonary deconditioning. Treated for pneumonia. Still with severely compromised respiratory function 1. Does the need for close, 24 hr/day medical supervision in concert with the patient's rehab needs make it unreasonable for this patient to be served in a less intensive setting? Potentially 2. Co-Morbidities requiring supervision/potential complications: Obstructive sleep apnea, hypertension, diabetes, atrial fibrillation, chronic kidney disease, congestive heart failure 3. Due to bladder management, bowel management, safety, skin/wound care, disease management, medication administration, pain management and patient education, does the patient require 24 hr/day rehab nursing?  Potentially 4. Does the patient require coordinated care of a physician, rehab nurse, PT (1-2 hrs/day, 5 days/week) and OT (1-2 hrs/day, 5 days/week) to address physical and functional deficits in the context of the above medical diagnosis(es)? Potentially Addressing deficits in the following areas: balance, endurance, locomotion, strength, transferring, bathing, dressing, feeding, grooming and toileting 5. Can the patient actively participate in an intensive therapy program of at least 3 hrs of therapy per day at least 5 days per week? No 6. The potential for patient to make measurable gains while on inpatient rehab is poor 7. Anticipated functional outcomes upon discharge from inpatient rehab are not applicable with PT, not applicable with OT, not applicable with SLP. 8. Estimated rehab length of stay to reach the above functional goals is: Not applicable 9. Does the patient have adequate social supports to accommodate these discharge functional goals? Potentially 10. Anticipated D/C setting: home vs SNF 11. Anticipated post D/C treatments: HH therapy 12. Overall Rehab/Functional Prognosis: fair  RECOMMENDATIONS: This patient's condition is appropriate for continued rehabilitative care in the following setting: LTACH Patient has agreed to participate in recommended program. Potentially Note that insurance prior authorization may be required for reimbursement for recommended care.  Comment: Lacks endurance and respiratory reserve for comprehensive intensive inpatient rehabilitation. If respiratory status improves to the point where he can tolerate 4 L per nasal cannula can reconsider.    09/24/2012

## 2012-09-24 NOTE — Progress Notes (Signed)
79yo male admitted w/ acute-on-chronic respiratory failure with hypoxia now to begin IV ABX.  Will start Primaxin 250mg  IV Q8H and monitor clinical progression.  Vernard Gambles, PharmD, BCPS 09/24/2012 1:19 AM

## 2012-09-24 NOTE — Progress Notes (Signed)
Upon my arrival, patient is already on CPAP. Per RN, wife placed earlier tonight and knows equipment well. Tolerating well at this time. RT will continue to monitor.

## 2012-09-24 NOTE — Progress Notes (Signed)
79yo male on heparin for DVT now to add Coumadin.  Baseline INR already scheduled with am labs.  Will verify baseline INR prior to dosing and continue to monitor for dose adjustments.  Vernard Gambles, PharmD, BCPS 09/24/2012 12:59 AM

## 2012-09-24 NOTE — Progress Notes (Signed)
[facility] Progress Note [place] TEAM 1 - Stepdown/ICU TEAM   [name] Pace Adrian Sr. ZOX:[id number] DOB: Feb 20, 1933 DOA: 2012-10-11 PCP: [name], MD  Brief narrative: 79 year old male W/ PMH Chronic resp failure on chronic pred and oxygen), admitted 7/7 with SOB and acute on chronic respiratory failure (just discharged 7/2). Pt has been total care since dc and has been unable to ambulate as well. He developed yellow sputum and cough despite completion of anbx's after dc. EMS was called and upon their arrival he was found to have saturations in the 80's so was placed on CPAP. In the ER he was transitioned to BiPAP and was started on broad spectrum anbx's. CXR revealed persistent pleural effusions and ?? LLL infiltrate so he was started on high dose Lasix.   Assessment/Plan: Active Problems:   Acute-on-chronic respiratory failure with Hypoxia due to:   A) Obstructive chronic bronchitis with exacerbation   B)  Acute on chronic diastolic congestive heart failure -clinically does not appear to have focal pneumonia nor CHF -suspect had acute flare of DHF which responded to Lasix since now has successfully weaned from BiPAP noting pt had minimal UOP despite high dose Lasix- he actually appears dry now (see below) -abx  and Lasix were dc'ed on 7/8 per step down rounding team -continue to hold off lasix, cr leveling off, follow -follow and taper steroids- was on prednisone 10 mg daily at home -pulmonary following for further recs -as recommended per pulm  primaxin was resumed to cover for ESBL Ecoli cultured from BAL/recent pna during recent hospitalization although it is noted that he completed 10days of primaxin 6/30   -Discussed patient again to Dr. Freida Busman and he sleeps okay to discontinue the Primaxin, and follow -suspect leukocytosis from steroids - no fever, follow and recheck -cont Diflucan since recent broad spectrum anbx's and high risk for candida -pt very deconditioned, PT  recommending SNF, CIR consulted per pulm   CKD (chronic kidney disease), stage III -BUN/Scr worsened with Lasix so it was  dc'ed  and pt gently hydrated on 7/8  -Cr  trending down, continue to hold off Lasix for now   Right upper extremity DVT -Continue heparin IV since CKD- continue coumadin per pharmacy -doppler of legs neg for DVT -treat with Coumadin for 3mos     Dysphagia/Deconditioning -wife states poor intake pre admit - ST for swallow eval 7/9 -Dysphagia 1 diet recommended per ST -Discussed with Dr. Vassie Loll PT recommending SNF, but wife  prefers CIR/LTAC-discussed with social worker and his issurance as will not approve LTAC, CIR consulted follow     HTN (hypertension) -BP stable, continue CCB and BB    Diabetes type 2, uncontrolled -was worsened by steroid dose, dc'ed per pulm 7/9 -cont SSI and increase lantus to 8u  follow and adjust dose based on CBG and PO intake    Atrial fibrillation -rate controlled with meds -cards following    Dehydration with hyponatremia -urine na <10 in 7/8,  urine osmo low at 362 -Continue holding off Lasix for now    CARCINOMA, LUNG, SQUAMOUS CELL  -tx'd 2006    CORONARY ARTERY DISEASE-nonobstructive disease 2006  catheterization  -no sx's    OSA (obstructive sleep apnea) -supplemental o2, pulm following for further recs    Hypoalbuminemia -contributing factor to extremity edema- albumin 1.6 -nutrition consulted 7/8   Constipation -SMOG enema and follow  DVT prophylaxis: SCDs Code Status: DO NOT RESUSCITATE Family Communication: Patient and wife at bedside Disposition Plan:  Likely to SNFwhen medically stable Isolation: Contact isolation for MRSA PCR positive status as well as recent treatment for ESBL Escherichia coli pneumonia Nutritional Status: Compromised with severe protein calorie malnutrition please see above  Consultants: Cardiology Pulmonology  Procedures: None  Antibiotics: Maxipime, Cipro and vancomycin 7/7  >>> 7/8  HPI/Subjective: Patient alert and appropriate. Sitting up in chair today, per wife was able to ambulate further with PT this am. he states breathing somewhat better today.  Objective: Blood pressure 130/98, pulse 71, temperature 98.2 F (36.8 C), temperature source Oral, resp. rate 22, height 5' 6.5" (1.689 m), weight 86.138 kg (189 lb 14.4 oz), SpO2 95.00%.  Intake/Output Summary (Last 24 hours) at 09/24/12 1625 Last data filed at 09/24/12 1500  Gross per 24 hour  Intake 1726.1 ml  Output   1975 ml  Net -248.9 ml     Exam: General: No acute respiratory distress Lungs: Scattered rhonchi, no wheezes or crackles, remains on  Ventimask Cardiovascular:regular rate and rhythm without murmur gallop or rub normal S1 and S2, without JVD Abdomen: Nontender, nondistended, soft, bowel sounds positive, no rebound, no ascites, no appreciable mass Extremities: No significant cyanosis,no edema Neurological: Alert and oriented x 3, moves all extremities x 4 without focal neurological deficits but has diffuse generalized weakness with strength being 3/5, CN 2-12 intact  Scheduled Meds: Scheduled Meds: . albuterol  2.5 mg Nebulization Q4H  . amiodarone  200 mg Oral BID  . aspirin EC  81 mg Oral Daily  . atorvastatin  40 mg Oral QHS  . Chlorhexidine Gluconate Cloth  6 each Topical Q0600  . citalopram  10 mg Oral Daily  . coumadin book   Does not apply Once  . diltiazem  180 mg Oral BID  . docusate sodium  100 mg Oral BID  . ezetimibe  10 mg Oral Daily  . feeding supplement  237 mL Oral BID BM  . fluconazole  100 mg Oral Daily  . guaiFENesin  1,200 mg Oral BID  . imipenem-cilastatin  250 mg Intravenous Q6H  . insulin aspart  0-9 Units Subcutaneous TID WC  . insulin glargine  8 Units Subcutaneous QHS  . ipratropium  0.5 mg Nebulization Q4H  . magnesium oxide  400 mg Oral Daily  . metoprolol succinate  25 mg Oral BID WC  . mineral oil  1 enema Rectal Daily  . multivitamin with  minerals  1 tablet Oral Daily  . mupirocin cream   Topical BID  . mupirocin ointment  1 application Nasal BID  . omega-3 acid ethyl esters  2 g Oral Daily  . pantoprazole  40 mg Oral Daily  . potassium chloride SA  20 mEq Oral Daily  . senna  1 tablet Oral BID  . sodium chloride  3 mL Intravenous Q12H  . sodium chloride  3 mL Intravenous Q12H  . vitamin B-12  50 mcg Oral Daily  . vitamin E  400 Units Oral BID  . warfarin  7.5 mg Oral ONCE-1800  . Warfarin - Pharmacist Dosing Inpatient   Does not apply q1800   Continuous Infusions: . heparin 800 Units/hr (09/23/12 2049)     Data Reviewed: Basic Metabolic Panel:  Recent Labs Lab 10/02/2012 1211 09/22/12 0500 09/23/12 0100 09/24/12 1015  NA 131* 131* 129* 134*  K 3.1* 3.5 3.3* 3.6  CL 85* 88* 88* 90*  CO2 33* 28 29 30   GLUCOSE 194* 259* 333* 219*  BUN 86* 95* 94* 98*  CREATININE 2.75* 3.07* 3.12*  2.48*  CALCIUM 8.6 9.0 8.7 9.1   Liver Function Tests:  Recent Labs Lab 10/15/2012 1211 09/22/12 0500  AST 31 23  ALT 37 32  ALKPHOS 110 87  BILITOT 0.5 0.4  PROT 5.9* 5.7*  ALBUMIN 2.4* 2.2*   No results found for this basename: LIPASE, AMYLASE,  in the last 168 hours No results found for this basename: AMMONIA,  in the last 168 hours CBC:  Recent Labs Lab 09/18/2012 1211 09/22/12 0500 09/23/12 0100 09/24/12 1010  WBC 18.9* 14.9* 18.4* 17.9*  NEUTROABS 15.6*  --   --   --   HGB 11.5* 10.1* 10.0* 10.7*  HCT 34.1* 30.0* 29.9* 31.7*  MCV 94.7 93.5 95.5 94.9  PLT 242 241 270 324   Cardiac Enzymes:  Recent Labs Lab 09/18/2012 1211 09/15/2012 1932 09/17/2012 2219 09/22/12 0419  TROPONINI <0.30 <0.30 <0.30 <0.30   BNP (last 3 results)  Recent Labs  09/06/12 0545 09/11/12 0500 09/22/2012 1211  PROBNP 1793.0* 1709.0* 806.5*   CBG:  Recent Labs Lab 09/23/12 1649 09/23/12 2104 09/24/12 0615 09/24/12 1054 09/24/12 1604  GLUCAP 297* 268* 188* 183* 277*    Recent Results (from the past 240 hour(s))   CULTURE, EXPECTORATED SPUTUM-ASSESSMENT     Status: None   Collection Time    09/14/12  5:24 PM      Result Value Range Status   Specimen Description SPUTUM   Final   Special Requests Normal   Final   Sputum evaluation     Final   Value: THIS SPECIMEN IS ACCEPTABLE. RESPIRATORY CULTURE REPORT TO FOLLOW.   Report Status 09/14/2012 FINAL   Final  CULTURE, RESPIRATORY (NON-EXPECTORATED)     Status: None   Collection Time    09/14/12  5:24 PM      Result Value Range Status   Specimen Description SPUTUM   Final   Special Requests NONE   Final   Gram Stain     Final   Value: NO WBC SEEN     NO SQUAMOUS EPITHELIAL CELLS SEEN     NO ORGANISMS SEEN   Culture NORMAL OROPHARYNGEAL FLORA   Final   Report Status 09/17/2012 FINAL   Final  CULTURE, BLOOD (ROUTINE X 2)     Status: None   Collection Time    10/09/2012  1:46 PM      Result Value Range Status   Specimen Description BLOOD ARM RIGHT   Final   Special Requests     Final   Value: BOTTLES DRAWN AEROBIC AND ANAEROBIC 10CCBLUE 5CCRED   Culture  Setup Time 09/24/2012 17:32   Final   Culture     Final   Value:        BLOOD CULTURE RECEIVED NO GROWTH TO DATE CULTURE WILL BE HELD FOR 5 DAYS BEFORE ISSUING A FINAL NEGATIVE REPORT   Report Status PENDING   Incomplete  CULTURE, BLOOD (ROUTINE X 2)     Status: None   Collection Time    10/12/2012  1:50 PM      Result Value Range Status   Specimen Description BLOOD HAND RIGHT   Final   Special Requests BOTTLES DRAWN AEROBIC ONLY 2.5CC   Final   Culture  Setup Time 09/20/2012 17:32   Final   Culture     Final   Value:        BLOOD CULTURE RECEIVED NO GROWTH TO DATE CULTURE WILL BE HELD FOR 5 DAYS BEFORE ISSUING A FINAL NEGATIVE REPORT  Report Status PENDING   Incomplete  MRSA PCR SCREENING     Status: Abnormal   Collection Time    10/12/2012  5:09 PM      Result Value Range Status   MRSA by PCR POSITIVE (*) NEGATIVE Final   Comment:            The GeneXpert MRSA Assay (FDA     approved for  NASAL specimens     only), is one component of a     comprehensive MRSA colonization     surveillance program. It is not     intended to diagnose MRSA     infection nor to guide or     monitor treatment for     MRSA infections.     RESULT CALLED TO, READ BACK BY AND VERIFIED WITH:     Bridget Hartshorn RN [id number] 09/27/2012 A BROWNING  URINE CULTURE     Status: None   Collection Time    09/16/2012  6:20 PM      Result Value Range Status   Specimen Description URINE, CATHETERIZED   Final   Special Requests NONE   Final   Culture  Setup Time 09/28/2012 19:16   Final   Colony Count NO GROWTH   Final   Culture NO GROWTH   Final   Report Status 09/22/2012 FINAL   Final     Studies:  I have reviewed all Recent x-ray studies in detail     If 7PM-7AM, please contact night-coverage [email or web address] Password [email or web address] 09/24/2012, 4:25 PM   LOS: 3 days    [name] Stevenson C,MD [phone] 09/24/2012, 4:25 PM

## 2012-09-24 NOTE — Progress Notes (Signed)
Patient Name: [name] HEADLEY Sr.      SUBJECTIVE:  resamitted with resp failure with co2 depdt COPD , possible pneumonia general weakness and chronic rate controlled afib and ? Aspiration  Possible component of HFpEF but not clearly so  Feels a little better; wlaked yesterday 10 ft   Past Medical History  Diagnosis Date  . Stricture and stenosis of esophagus 12/07/2004    EGD done on 12/07/2004  . GERD (gastroesophageal reflux disease)   . Hypoxemia     With chronic respiratory failure  . Increased prostate specific antigen (PSA) velocity 02/26/2011  . DM (diabetes mellitus) 02/26/2011  . Gout 02/26/2011  . COPD (chronic obstructive pulmonary disease)     a. oxygen at home, 4L. b. On prednisone.  . OSA (obstructive sleep apnea) 02/26/2011    CPAP  . Paroxysmal atrial fibrillation   . Depression   . Cardiac arrest 2009    while in hospital   . Hypertension   . HTN (hypertension) 02/26/2011  . Cardiac arrest 2009    while in hospital  . Non-small cell lung cancer     a. s/p RUL lobectomy, adjuvant chemo 2006. b. New lung nodule seen 08/2012, OP f/u recommended.  . Basal cell carcinoma of nose 02/26/2011  . ETOH abuse 01/29/2012    started drinking again - hx cardiac arrest in 2009 due to dt's with organ failure  . CKD (chronic kidney disease) 02/28/2012  . Chronic diastolic CHF (congestive heart failure)     a. EF 65-70% 08/2012. b. Complex picture with ascites/anasarca/low albumin/CKD.  . Sick sinus syndrome     a. s/p [employer] pacemaker 2010.  . Bradycardia     a. Admitted 2010 with severe bradycardia, runs of paroxysmal VT.   Marland Kitchen Paroxysmal VT     a. In 2010 in setting of marked bradycardia.  Marland Kitchen CAD (coronary artery disease)     a. Nonobstructive CAD by cath 2006.  Marland Kitchen Esophageal ulcer     a. By EGD 10/2011.  Marland Kitchen Hiatal hernia     a. By EGD 10/2011.  . Valvular heart disease     a. Mild MR/mild AI/mild AS by echo 05/2011.  Marland Kitchen Hemoptysis     a. 08/2012:  Bronchoscopy  09/01/12 did not determine a source - bronchial washings did not show malignant cells on path. ENT did not find any pathology in the upper most respiratory/gi tract by fiberoptic laryngoscopy. A second bronchoscopy was done on 09/11/12 - this time a lesion along the RML suture line from previous lobectomy was found, but without evidence for malignancy on biopsy.   . Pneumonia     a. Hosp 08/2012 with + ESBL E coli, candida albicans on BAL tx with levaquin, primaxin, diflucan - complex hospitalization with CKD, anasarca, CHF, afib, hemoptysis.  . Fungal infection     a. Rash 08/2012 - felt to be disseminated fungal infection, also treated with Valtrex for possible buttocks shingles.  . Hypoalbuminemia     a. 08/2012.    Scheduled Meds:  Scheduled Meds: . amiodarone  200 mg Oral BID  . aspirin EC  81 mg Oral Daily  . atorvastatin  40 mg Oral QHS  . Chlorhexidine Gluconate Cloth  6 each Topical Q0600  . citalopram  10 mg Oral Daily  . diltiazem  180 mg Oral BID  . docusate sodium  100 mg Oral BID  . ezetimibe  10 mg Oral Daily  . feeding supplement  237 mL Oral BID  BM  . fluconazole  100 mg Oral Daily  . guaiFENesin  1,200 mg Oral BID  . imipenem-cilastatin  250 mg Intravenous Q8H  . insulin aspart  0-9 Units Subcutaneous TID WC  . insulin glargine  8 Units Subcutaneous QHS  . magnesium oxide  400 mg Oral Daily  . metoprolol succinate  25 mg Oral BID WC  . mineral oil  1 enema Rectal Daily  . multivitamin with minerals  1 tablet Oral Daily  . mupirocin cream   Topical BID  . mupirocin ointment  1 application Nasal BID  . omega-3 acid ethyl esters  2 g Oral Daily  . pantoprazole  40 mg Oral Daily  . potassium chloride SA  20 mEq Oral Daily  . senna  1 tablet Oral BID  . sodium chloride  3 mL Intravenous Q12H  . sodium chloride  3 mL Intravenous Q12H  . sorbitol, milk of mag, mineral oil, glycerin (SMOG) enema  960 mL Rectal Once  . vitamin B-12  50 mcg Oral Daily  . vitamin E  400  Units Oral BID  . Warfarin - Pharmacist Dosing Inpatient   Does not apply q1800   Continuous Infusions: . heparin 800 Units/hr (09/23/12 2049)    PHYSICAL EXAM Filed Vitals:   09/23/12 1500 09/23/12 1837 09/23/12 2030 09/24/12 0546  BP: 123/74 133/82 139/75 149/70  Pulse: 89 65 70 70  Temp: 98 F (36.7 C)  97.2 F (36.2 C) 96.8 F (36 C)  TempSrc: Oral  Axillary Axillary  Resp: 20  16 18   Height:      Weight:    189 lb 14.4 oz (86.138 kg)  SpO2: 95%  94% 98%    Well developed and nourished on O2HENT normal Neck supple   Coarse  Regular rate and rhythm, no murmurs or gallops Abd-soft with active BS No Clubbing cyanosis min edema x right hand Skin-warm and dry A & Oriented  Grossly normal sensory and motor function   TELEMETRY: Reviewed telemetry pt in afib cvr:    Intake/Output Summary (Last 24 hours) at 09/24/12 0822 Last data filed at 09/24/12 0700  Gross per 24 hour  Intake 1009.1 ml  Output   2475 ml  Net -1465.9 ml    LABS: Basic Metabolic Panel:  Recent Labs Lab 10/02/2012 1211 09/22/12 0500 09/23/12 0100  NA 131* 131* 129*  K 3.1* 3.5 3.3*  CL 85* 88* 88*  CO2 33* 28 29  GLUCOSE 194* 259* 333*  BUN 86* 95* 94*  CREATININE 2.75* 3.07* 3.12*  CALCIUM 8.6 9.0 8.7   Cardiac Enzymes:  Recent Labs  09/25/2012 1932 10/10/2012 2219 09/22/12 0419  TROPONINI <0.30 <0.30 <0.30   CBC:  Recent Labs Lab 09/19/2012 1211 09/22/12 0500 09/23/12 0100  WBC 18.9* 14.9* 18.4*  NEUTROABS 15.6*  --   --   HGB 11.5* 10.1* 10.0*  HCT 34.1* 30.0* 29.9*  MCV 94.7 93.5 95.5  PLT 242 241 270   PROTIME: No results found for this basename: LABPROT, INR,  in the last 72 hours Liver Function Tests:  Recent Labs  09/20/2012 1211 09/22/12 0500  AST 31 23  ALT 37 32  ALKPHOS 110 87  BILITOT 0.5 0.4  PROT 5.9* 5.7*  ALBUMIN 2.4* 2.2*   No results found for this basename: LIPASE, AMYLASE,  in the last 72 hours BNP: BNP (last 3 results)  Recent Labs   09/06/12 0545 09/11/12 0500 09/18/2012 1211  PROBNP 1793.0* 1709.0* 806.5*  ASSESSMENT AND PLAN:  Active Problems:   CARCINOMA, LUNG, SQUAMOUS CELL   CORONARY ARTERY DISEASE-nonobstructive disease 2006 catheterization   Dysphagia   OSA (obstructive sleep apnea)   HTN (hypertension)   Diabetes type 2, uncontrolled   Atrial fibrillation   CKD (chronic kidney disease), stage III   Acute on chronic diastolic congestive heart failure   Acute-on-chronic respiratory failure   Obstructive chronic bronchitis with exacerbation   Dehydration with hyponatremia   Hypoalbuminemia   Hypoxia  euvoliemic  RUE DVT noted  Heparin started per PCP Not sure about right anticoagulant but w GFR at 18 and [place], hep and coumadin prob best bet Continue current meds Await NHP  Consider CIR Follow Cr  Signed, Sherryl Manges MD  09/24/2012

## 2012-09-24 NOTE — Progress Notes (Signed)
ANTICOAGULATION CONSULT NOTE - Follow Up Consult  Pharmacy Consult for heparin and coumadin Indication: DVT  Labs:  Recent Labs  09/28/2012 1932 09/18/2012 2219 09/22/12 0419 09/22/12 0500 09/23/12 0100 09/23/12 1005 09/24/12 1010 09/24/12 1015  HGB  --   --   --  10.1* 10.0*  --  10.7*  --   HCT  --   --   --  30.0* 29.9*  --  31.7*  --   PLT  --   --   --  241 270  --  324  --   LABPROT  --   --   --   --   --   --   --  13.9  INR  --   --   --   --   --   --   --  1.09  HEPARINUNFRC  --   --   --   --  0.63 0.65  --  0.31  CREATININE  --   --   --  3.07* 3.12*  --   --  2.48*  TROPONINI <0.30 <0.30 <0.30  --   --   --   --   --     Assessment: 79yo male on heparin and coumadin for DVT. Start Coumadin today.  Day 1/5 overlap  Goal of Therapy:  Heparin level 0.3-0.5 units/ml INR goal 2-3   Plan:  1) Continue heparin at 800 units / hr  2) Follow up AM heparin level, CBC 3) Coumadin 7.5mg  x1 today 4) Follow up INR in AM  Thank you Piedad Climes, PharmD Clinical Pharmacist - Resident Pager: [phone] Pharmacy: [phone]  09/24/2012 11:45 AM

## 2012-09-24 NOTE — Progress Notes (Signed)
PT Cancellation Note  Patient Details Name: [name] GRZYWACZ Sr. MRN: [id number] DOB: 03/21/1932   Cancelled Treatment:    Reason Eval/Treat Not Completed: Medical issues which prohibited therapy  Pt too fatigued from working with OT this morning to tolerate PT session at this time.  RT also in room with pt.   [name],[name] 09/24/2012, 3:37 PM Zenovia Jarred, PT, DPT 09/24/2012 Pager: [phone]

## 2012-09-24 NOTE — Progress Notes (Signed)
PULMONARY  / CRITICAL CARE MEDICINE  Name: [name] LARRABEE Sr. MRN: [id number] DOB: Apr 14, 1932    ADMISSION DATE:  10-21-12 CONSULTATION DATE:  2012-10-21  REFERRING MD :  Dewayne Shorter PRIMARY SERVICE:  Internal Medicine  CHIEF COMPLAINT:  SOB  BRIEF PATIENT DESCRIPTION: 79 year old male W/ PMH Chronic resp failure on chronic pred and oxygen), admitted 7/7 with SOB and acute on chronicrespiratory failure (just discharged 7/2).   SIGNIFICANT EVENTS / STUDIES:  7/7 Admit  LINES / TUBES: R CW PAC >>  CULTURES: 7/7 blood x2 >>ng  ANTIBIOTICS:  COmpleted 10 ds of Primaxin 6/30 7/7 Cefepime >> 7/7 7/7 Vancomycin >> 7/7 Imipenem >> 7/7 Cipro >>  HISTORY OF PRESENT ILLNESS:  [name] Stevenson is a 79 year old male with a history of COPD and recent ESBL pneumonia. He was discharged on 7/2 (adm 6/12 ) and sustained a fall on day of discharge, with difficulty getting him up. His family notes that he has been SOB since discharge and has been requiring 4 L/min [place] at home. His SOB has continued to worsen and he is bringing up yellow sputum.   His SOB acutely worsened today and EMS was called. On arrival they noted SpO2 in the 80's, and was placed on CPAP by EMS, which was transitioned to BiPaP by the ED. He has received broad spectrum antibiotics while in ED.  PMH  Of NSCLC s/p RULectomy and adjuvant chemo in 2006    has a past medical history of Stricture and stenosis of esophagus (12/07/2004); GERD (gastroesophageal reflux disease); Hypoxemia; Increased prostate specific antigen (PSA) velocity (02/26/2011); DM (diabetes mellitus) (02/26/2011); Gout (02/26/2011); COPD (chronic obstructive pulmonary disease); OSA (obstructive sleep apnea) (02/26/2011); Paroxysmal atrial fibrillation; Depression; Cardiac arrest (2009); Hypertension; HTN (hypertension) (02/26/2011); Cardiac arrest (2009); Non-small cell lung cancer; Basal cell carcinoma of nose (02/26/2011); ETOH abuse (01/29/2012); CKD (chronic kidney disease)  (02/28/2012); Chronic diastolic CHF (congestive heart failure); Sick sinus syndrome; Bradycardia; Paroxysmal VT; CAD (coronary artery disease); Esophageal ulcer; Hiatal hernia; Valvular heart disease; Hemoptysis; Pneumonia; Fungal infection; and Hypoalbuminemia.   has past surgical history that includes Lung lobectomy; Knee arthroscopy; Visual merchandiser; Tonsillectomy; Hernia repair; Cardiac catheterization; Esophagogastroduodenoscopy (10/25/2011); Video bronchoscopy (N/A, 09/01/2012); and Video bronchoscopy (Bilateral, 09/11/2012).    SUBJECTIVE:    Feels better , remains on venti mask C/o constipation wife at bedside - requests more frequent nebs  VITAL SIGNS: Temp:  [96.8 F (36 C)-98 F (36.7 C)] 96.8 F (36 C) (07/10 0546) Pulse Rate:  [65-89] 70 (07/10 0546) Resp:  [16-20] 18 (07/10 0546) BP: (123-149)/(70-82) 149/70 mmHg (07/10 0546) SpO2:  [86 %-98 %] 91 % (07/10 1041) FiO2 (%):  [35 %] 35 % (07/10 1201) Weight:  [189 lb 14.4 oz (86.138 kg)] 189 lb 14.4 oz (86.138 kg) (07/10 0546)  PHYSICAL EXAMINATION: General:  Chronically ill-appearing elderly male, in NAD Neuro:  A&Ox3, follows commands HEENT:  No scleral icterus Neck:  No JVD, trachea midline, no carotid bruit Cardiovascular:  RRR, S1 and S2. +2 radial and DP pulses Lungs:  Scattered rhonchi in anterior lung fields Abdomen:  Round, soft, non-tender, active BS Musculoskeletal:  MAE well and equal Skin:  Skin tears on left hand, left knee. Intact otherwise   Recent Labs Lab 09/22/12 0500 09/23/12 0100 09/24/12 1015  NA 131* 129* 134*  K 3.5 3.3* 3.6  CL 88* 88* 90*  CO2 28 29 30   BUN 95* 94* 98*  CREATININE 3.07* 3.12* 2.48*  GLUCOSE 259* 333* 219*  Recent Labs Lab 09/22/12 0500 09/23/12 0100 09/24/12 1010  HGB 10.1* 10.0* 10.7*  HCT 30.0* 29.9* 31.7*  WBC 14.9* 18.4* 17.9*  PLT 241 270 324    Recent Labs Lab 09/25/2012 1211  PROBNP 806.5*    No results found.  ASSESSMENT / PLAN:   Acute on  Chronic Respiratory Failure multifactoral in nature, likely causes are progressive deconditioning, COPD (not currently in acute exacerbation: O2 and pred dep), heart failure,  Pneumonia, acute on chronic renal failure Appears to have FTT since discharge. He does have h/o esophageal stricture, so this also raises the concern for aspiration.  CXR unchanged from prior with LLL atelectasis vs effusion H/O OSA (nocturnal CPAP)   P: - Continue Bipap PRN and QHS - Can transition to nasal cannula for comfort - satn 90% & above acceptable, venti mask for now - OK to stop Imipenem -he was treated x 7ds last admit for ESBL - Continue home BD regimen - Continue home inhaled steroid regimen - Would give lasix to keep euvolemic; track bnp as needed - dc systemic corticosteroids  - could consider dysphagia eval and esophogram to eval for reflux IF he improves from current status.  _ If he does improve, would push PT, definitely cannot go home,    Constipation- enema planned  RUE DVT - coumadin x 3 mnths   All other issues:  A on C renal failure, fluid and electrolyte disturbances, mild Anemia of chronic disease, Afib, GERD, DM w/ Hyperglycemia per Primary service.   GLOBAL DNR Eventual SNF-rehab, wife prefers continuity - will place consult for LTAC/ CIR D/w Dr Sanda Klein   Cyril Mourning MD. [facility] Pager [phone] If no response call [phone]   09/24/2012 1:10 PM

## 2012-09-25 ENCOUNTER — Inpatient Hospital Stay (HOSPITAL_COMMUNITY): Payer: Medicare Other

## 2012-09-25 DIAGNOSIS — I82629 Acute embolism and thrombosis of deep veins of unspecified upper extremity: Secondary | ICD-10-CM

## 2012-09-25 LAB — BASIC METABOLIC PANEL
Chloride: 95 mEq/L — ABNORMAL LOW (ref 96–112)
GFR calc non Af Amer: 27 mL/min — ABNORMAL LOW (ref >90)
Glucose, Bld: 152 mg/dL — ABNORMAL HIGH (ref 70–99)
Potassium: 3.4 mEq/L — ABNORMAL LOW (ref 3.5–5.1)
Sodium: 138 mEq/L (ref 135–145)

## 2012-09-25 LAB — GLUCOSE, CAPILLARY
Glucose-Capillary: 125 mg/dL — ABNORMAL HIGH (ref 70–99)
Glucose-Capillary: 193 mg/dL — ABNORMAL HIGH (ref 70–99)

## 2012-09-25 LAB — CBC
HCT: 33.3 % — ABNORMAL LOW (ref 39.0–52.0)
Hemoglobin: 11.2 g/dL — ABNORMAL LOW (ref 13.0–17.0)
MCHC: 33.6 g/dL (ref 30.0–36.0)
RBC: 3.5 MIL/uL — ABNORMAL LOW (ref 4.22–5.81)
WBC: 12.1 10*3/uL — ABNORMAL HIGH (ref 4.0–10.5)

## 2012-09-25 MED ORDER — FUROSEMIDE 20 MG PO TABS
60.0000 mg | ORAL_TABLET | Freq: Two times a day (BID) | ORAL | Status: DC
  Administered 2012-09-26: 60 mg via ORAL
  Filled 2012-09-25 (×4): qty 1

## 2012-09-25 MED ORDER — SODIUM CHLORIDE 0.9 % IJ SOLN
10.0000 mL | INTRAMUSCULAR | Status: DC | PRN
  Administered 2012-09-25 – 2012-09-27 (×2): 10 mL

## 2012-09-25 MED ORDER — SODIUM CHLORIDE 0.9 % IJ SOLN: 10.0000 mL | Freq: Two times a day (BID) | INTRAMUSCULAR | Status: DC

## 2012-09-25 MED ORDER — TEMAZEPAM 15 MG PO CAPS
15.0000 mg | ORAL_CAPSULE | Freq: Every evening | ORAL | Status: DC | PRN
  Administered 2012-09-26: 15 mg via ORAL
  Filled 2012-09-25 (×2): qty 1

## 2012-09-25 NOTE — Progress Notes (Signed)
Pt is on home CPAP unit with full face mask. Pt has 4 lpm oxygen bleed in. Pt is comfortable. RT made pt aware that if he needed anything to please call.

## 2012-09-25 NOTE — Progress Notes (Signed)
I met with pt's wife at bedside. Discussed that patient unable to tolerate intense inpt rehab nor will his [employer] Medicare approve inpt rehab for this diagnosis. Wife requesting LTACH [facility]. I discussed that [facility] insurance may also be a barrier . She then prefers SNF at [facility] and states she is working with SW to obtain bed offer if [facility] admit not possible. We will sign off. Please call for any questions. [phone]

## 2012-09-25 NOTE — Progress Notes (Signed)
Subjective:  Patient is breathing better, less dyspneic.  Having some hemoptysis overnight. Weight down with diuresis. Remains in atrial fib with controlled ventricular response on amiodarone. On metoprolol and diltiazem. EF good 65-70 %  Objective:  Vital Signs in the last 24 hours: Temp:  [98.2 F (36.8 C)-99 F (37.2 C)] 99 F (37.2 C) (07/11 0545) Pulse Rate:  [68-71] 70 (07/11 0545) Resp:  [20-22] 20 (07/11 0545) BP: (130-150)/(75-98) 150/75 mmHg (07/11 0545) SpO2:  [86 %-95 %] 93 % (07/11 0545) FiO2 (%):  [35 %] 35 % (07/10 2131) Weight:  [183 lb 14.4 oz (83.416 kg)] 183 lb 14.4 oz (83.416 kg) (07/11 0545)  Intake/Output from previous day: 07/10 0701 - 07/11 0700 In: 1200 [P.O.:1200] Out: 1150 [Urine:1150] Intake/Output from this shift:    . albuterol  2.5 mg Nebulization Q4H  . amiodarone  200 mg Oral BID  . aspirin EC  81 mg Oral Daily  . atorvastatin  40 mg Oral QHS  . Chlorhexidine Gluconate Cloth  6 each Topical Q0600  . citalopram  10 mg Oral Daily  . diltiazem  180 mg Oral BID  . docusate sodium  100 mg Oral BID  . ezetimibe  10 mg Oral Daily  . feeding supplement  237 mL Oral BID BM  . fluconazole  100 mg Oral Daily  . guaiFENesin  1,200 mg Oral BID  . insulin aspart  0-9 Units Subcutaneous TID WC  . insulin glargine  8 Units Subcutaneous QHS  . ipratropium  0.5 mg Nebulization Q4H  . magnesium oxide  400 mg Oral Daily  . metoprolol succinate  25 mg Oral BID WC  . mineral oil  1 enema Rectal Daily  . multivitamin with minerals  1 tablet Oral Daily  . mupirocin cream   Topical BID  . mupirocin ointment  1 application Nasal BID  . omega-3 acid ethyl esters  2 g Oral Daily  . pantoprazole  40 mg Oral Daily  . potassium chloride SA  20 mEq Oral Daily  . senna  1 tablet Oral BID  . sodium chloride  3 mL Intravenous Q12H  . sodium chloride  3 mL Intravenous Q12H  . vitamin B-12  50 mcg Oral Daily  . vitamin E  400 Units Oral BID  . Warfarin - Pharmacist  Dosing Inpatient   Does not apply q1800   . heparin 800 Units/hr (09/23/12 2049)    Physical Exam: The patient appears to be in no distress. O2 in place.  Head and neck exam reveals that the pupils are equal and reactive.  The extraocular movements are full.  There is no scleral icterus.  Mouth and pharynx are benign.  No lymphadenopathy.  No carotid bruits.  The jugular venous pressure is normal.  Thyroid is not enlarged or tender.  Chest reveals bilateral expiratory wheezing. Heart reveals no abnormal lift or heave.  First and second heart sounds are normal.  There is no murmur gallop rub or click. Pulse is irregular.  The abdomen is soft and nontender.  Bowel sounds are normoactive.  There is no hepatosplenomegaly or mass.  There are no abdominal bruits.  Extremities reveal no edema.    Neurologic exam is normal strength and no lateralizing weakness.  No sensory deficits.  Integument reveals no rash  Lab Results:  Recent Labs  09/23/12 0100 09/24/12 1010  WBC 18.4* 17.9*  HGB 10.0* 10.7*  PLT 270 324    Recent Labs  09/23/12 0100 09/24/12 1015  NA 129* 134*  K 3.3* 3.6  CL 88* 90*  CO2 29 30  GLUCOSE 333* 219*  BUN 94* 98*  CREATININE 3.12* 2.48*   No results found for this basename: TROPONINI, CK, MB,  in the last 72 hours Hepatic Function Panel No results found for this basename: PROT, ALBUMIN, AST, ALT, ALKPHOS, BILITOT, BILIDIR, IBILI,  in the last 72 hours No results found for this basename: CHOL,  in the last 72 hours No results found for this basename: PROTIME,  in the last 72 hours  Imaging: Imaging results have been reviewed  Cardiac Studies: Telemetry shows atrial fib with V pacing. Assessment/Plan:  1. Acute on chronic diastolic CHF 2. Obstructive chronic bronchitis with exacerbation. 3. Paroxysmal atrial fib on heparin/warfarin 4. Hemoptysis 5. Right upper extremity DVT 6. Pacemaker.  Plan: Will recheck chest xray today. Labs today  pending. Continue diuresis.  LOS: 4 days    [name] Stevenson 09/25/2012, 7:39 AM

## 2012-09-25 NOTE — Progress Notes (Signed)
Physical Therapy Treatment Patient Details Name: BRAXDEN LOVERING Sr. MRN: [id number] DOB: October 03, 1932 Today's Date: 09/25/2012 Time: 4098-1191 PT Time Calculation (min): 18 min  PT Assessment / Plan / Recommendation  PT Comments   Pt agreeable to participate in therapy but required increased assistance for transfers & unable to ambulate today as he was in last PT session.  Pt states he feels weak & tired today.     Follow Up Recommendations  SNF     Does the patient have the potential to tolerate intense rehabilitation     Barriers to Discharge        Equipment Recommendations  None recommended by PT    Recommendations for Other Services    Frequency Min 3X/week   Progress towards PT Goals Progress towards PT goals: Not progressing toward goals - comment  Plan Current plan remains appropriate    Precautions / Restrictions Precautions Precautions: Fall Precaution Comments: pt with fall at home on 7/2 (day of previous d/c). Pt also on venti mask at 35% Restrictions Weight Bearing Restrictions: No   Pertinent Vitals/Pain No pain reported.  35% F02 venturi mask.   3/4 dyspnea with stand-pivot transfer bed>recliner.      Mobility  Bed Mobility Bed Mobility: Supine to Sit;Sitting - Scoot to Edge of Bed Supine to Sit: 4: Min assist Sitting - Scoot to Edge of Bed: 3: Mod assist Details for Bed Mobility Assistance: Cues for technique.  (A) with use of draw pad to bring hips closer to EOB.   Transfers Transfers: Sit to Stand;Stand to Sit;Stand Pivot Transfers Sit to Stand: 1: +2 Total assist;With upper extremity assist;From bed Sit to Stand: Patient Percentage: 60% Stand to Sit: 1: +2 Total assist;With armrests;To chair/3-in-1 Stand to Sit: Patient Percentage: 60% Stand Pivot Transfers: 1: +2 Total assist Stand Pivot Transfers: Patient Percentage: 60% Details for Transfer Assistance: cues for hand placement, posture with standing, body positioning before sitting, & technique.  Pt  appeared to be very weak today.  Required increased assist compared to last PT session.   Ambulation/Gait Ambulation/Gait Assistance: Not tested (comment)      PT Goals (current goals can now be found in the care plan section) Acute Rehab PT Goals PT Goal Formulation: With patient/family Time For Goal Achievement: 09/28/2012 Potential to Achieve Goals: Good  Visit Information  Last PT Received On: 09/25/12 Assistance Needed: +2 History of Present Illness: acute/chronic resp failure, CHF, new R UE blood clot    Subjective Data      Cognition  Cognition Arousal/Alertness: Awake/alert Behavior During Therapy: WFL for tasks assessed/performed Overall Cognitive Status: Within Functional Limits for tasks assessed    Balance     End of Session PT - End of Session Equipment Utilized During Treatment: Oxygen Activity Tolerance: Patient limited by fatigue Patient left: in chair;with call bell/phone within reach;with family/visitor present;with nursing/sitter in room Nurse Communication: Mobility status   GP     Lara Mulch 09/25/2012, 1:57 PM   Verdell Face, PTA [phone] 09/25/2012

## 2012-09-25 NOTE — Progress Notes (Addendum)
[facility] Progress Note [place] TEAM 1 - Stepdown/ICU TEAM   [name] Pace Wynns Sr. ZOX:[id number] DOB: 04-15-1932 DOA: 09/24/2012 PCP: [name], MD  Brief narrative: 79 year old male W/ PMH Chronic resp failure on chronic pred and oxygen), admitted 7/7 with SOB and acute on chronic respiratory failure (just discharged 7/2). Pt has been total care since dc and has been unable to ambulate as well. He developed yellow sputum and cough despite completion of anbx's after dc. EMS was called and upon their arrival he was found to have saturations in the 80's so was placed on CPAP. In the ER he was transitioned to BiPAP and was started on broad spectrum anbx's. CXR revealed persistent pleural effusions and ?? LLL infiltrate so he was started on high dose Lasix.   Assessment/Plan: Active Problems:   Acute-on-chronic respiratory failure with Hypoxia due to:   A) Obstructive chronic bronchitis with exacerbation   B)  Acute on chronic diastolic congestive heart failure -clinically does not appear to have focal pneumonia nor CHF -suspect had acute flare of DHF which responded to Lasix since now has successfully weaned from BiPAP noting pt had minimal UOP despite high dose Lasix- he actually appears dry now (see below) -abx  and Lasix were dc'ed on 7/8 per step down rounding team -continue to hold off lasix, cr leveling off, follow -follow and taper steroids- was on prednisone 10 mg daily at home -pulmonary following for further recs -as recommended per pulm  primaxin was resumed to cover for ESBL Ecoli cultured from BAL/recent pna during recent hospitalization although it is noted that he completed 10days of primaxin 6/30   -Discussed patient again to Dr. Freida Busman and he sleeps okay to discontinue the Primaxin, and follow -suspect leukocytosis from steroids - no fever, follow and recheck -cont Diflucan since recent broad spectrum anbx's and high risk for candida -pt with hemoptysis and heparin and  coumadin dc'ed per pulm today -will resume lasix in am, cr continuing to trend down today. Recheck BNP in am   CKD (chronic kidney disease), stage III -BUN/Scr worsened with Lasix so it was  dc'ed  and pt gently hydrated on 7/8  -Cr  trending down, as above resume Lasix in am and continue to monitor cr closely   Right upper extremity DVT -doppler of legs neg for DVT -Coumadin and heparin d/ced as above due to hemoptysis, discussed with Dr Vassie Loll who states he will plan to keep him off anticoagulation for 1week and then follow and reassess for further treatment      Dysphagia/Deconditioning -wife states poor intake pre admit - ST for swallow eval 7/9 -Dysphagia 1 diet but minimal po intake today, follow -Discussed with Dr. Vassie Loll PT recommending SNF, but wife  prefers CIR/LTAC-discussed with social worker and his insurance as will not approve LTAC, CIR declined pt. Await SNF     HTN (hypertension) -BP stable, continue CCB and BB    Diabetes type 2, uncontrolled -was worsened by steroid dose, dc'ed per pulm 7/9 -cont SSI and lantus to 8u, BG 125-193 today,  follow and adjust dose based on CBG and PO intake    Atrial fibrillation -rate controlled with meds -cards following    Dehydration with hyponatremia -urine na <10 in 7/8,  urine osmo low at 362 -improved off lasix so far, will follow and resume po lasix in am to avoid vol overload    CARCINOMA, LUNG, SQUAMOUS CELL  -tx'd 2006    CORONARY ARTERY DISEASE-nonobstructive disease  2006  catheterization  -no sx's    OSA (obstructive sleep apnea) -supplemental o2, pulm following for further recs    Hypoalbuminemia -contributing factor to extremity edema- albumin 1.6 -nutrition consulted 7/8   Constipation -resolved with SMOG enema, follow  Hypokalemia -replace k  DVT prophylaxis: SCDs Code Status: DO NOT RESUSCITATE Family Communication: Patient and wife, duaghter at bedside Disposition Plan: Likely to SNFwhen medically  stable Isolation: Contact isolation for MRSA PCR positive status as well as recent treatment for ESBL Escherichia coli pneumonia Nutritional Status: Compromised with severe protein calorie malnutrition please see above  Consultants: Cardiology Pulmonology  Procedures: None  Antibiotics: Maxipime, Cipro and vancomycin 7/7 >>> 7/8  HPI/Subjective: Patient with hemoptysis this am and more dyspneic, remains on 35% veni mask with sat 97%. + BM with enema  Objective: Blood pressure 141/87, pulse 83, temperature 98.9 F (37.2 C), temperature source Oral, resp. rate 21, height 5' 6.5" (1.689 m), weight 83.416 kg (183 lb 14.4 oz), SpO2 97.00%.  Intake/Output Summary (Last 24 hours) at 09/25/12 2045 Last data filed at 09/25/12 1902  Gross per 24 hour  Intake   1000 ml  Output   3475 ml  Net  -2475 ml     Exam: General: No accessory muscle use, chronically ill appearing Lungs: Scattered rhonchi, no wheezes or crackles, remains on  Ventimask Cardiovascular:regular rate and rhythm without murmur gallop or rub normal S1 and S2, without JVD Abdomen: Nontender, nondistended, soft, bowel sounds positive, no rebound, no ascites, no appreciable mass Extremities: No cyanosis,no edema Neurological: Alert and oriented x 3, moves all extremities x 4 without focal neurological deficits but has diffuse generalized weakness with strength being 3/5, CN 2-12 intact  Scheduled Meds: Scheduled Meds: . albuterol  2.5 mg Nebulization Q4H  . amiodarone  200 mg Oral BID  . aspirin EC  81 mg Oral Daily  . atorvastatin  40 mg Oral QHS  . Chlorhexidine Gluconate Cloth  6 each Topical Q0600  . citalopram  10 mg Oral Daily  . diltiazem  180 mg Oral BID  . docusate sodium  100 mg Oral BID  . ezetimibe  10 mg Oral Daily  . feeding supplement  237 mL Oral BID BM  . fluconazole  100 mg Oral Daily  . guaiFENesin  1,200 mg Oral BID  . insulin aspart  0-9 Units Subcutaneous TID WC  . insulin glargine  8 Units  Subcutaneous QHS  . ipratropium  0.5 mg Nebulization Q4H  . magnesium oxide  400 mg Oral Daily  . metoprolol succinate  25 mg Oral BID WC  . mineral oil  1 enema Rectal Daily  . multivitamin with minerals  1 tablet Oral Daily  . mupirocin cream   Topical BID  . mupirocin ointment  1 application Nasal BID  . omega-3 acid ethyl esters  2 g Oral Daily  . pantoprazole  40 mg Oral Daily  . potassium chloride SA  20 mEq Oral Daily  . senna  1 tablet Oral BID  . sodium chloride  10-40 mL Intracatheter Q12H  . sodium chloride  3 mL Intravenous Q12H  . sodium chloride  3 mL Intravenous Q12H  . vitamin B-12  50 mcg Oral Daily  . vitamin E  400 Units Oral BID   Continuous Infusions:     Data Reviewed: Basic Metabolic Panel:  Recent Labs Lab 09/27/2012 1211 09/22/12 0500 09/23/12 0100 09/24/12 1015 09/25/12 1040  NA 131* 131* 129* 134* 138  K 3.1* 3.5  3.3* 3.6 3.4*  CL 85* 88* 88* 90* 95*  CO2 33* 28 29 30 30   GLUCOSE 194* 259* 333* 219* 152*  BUN 86* 95* 94* 98* 89*  CREATININE 2.75* 3.07* 3.12* 2.48* 2.22*  CALCIUM 8.6 9.0 8.7 9.1 9.0   Liver Function Tests:  Recent Labs Lab 10-20-12 1211 09/22/12 0500  AST 31 23  ALT 37 32  ALKPHOS 110 87  BILITOT 0.5 0.4  PROT 5.9* 5.7*  ALBUMIN 2.4* 2.2*   No results found for this basename: LIPASE, AMYLASE,  in the last 168 hours No results found for this basename: AMMONIA,  in the last 168 hours CBC:  Recent Labs Lab 2012-10-20 1211 09/22/12 0500 09/23/12 0100 09/24/12 1010 09/25/12 1040  WBC 18.9* 14.9* 18.4* 17.9* 12.1*  NEUTROABS 15.6*  --   --   --   --   HGB 11.5* 10.1* 10.0* 10.7* 11.2*  HCT 34.1* 30.0* 29.9* 31.7* 33.3*  MCV 94.7 93.5 95.5 94.9 95.1  PLT 242 241 270 324 325   Cardiac Enzymes:  Recent Labs Lab 10-20-12 1211 October 20, 2012 1932 2012-10-20 2219 09/22/12 0419  TROPONINI <0.30 <0.30 <0.30 <0.30   BNP (last 3 results)  Recent Labs  09/06/12 0545 09/11/12 0500 10-20-12 1211  PROBNP 1793.0*  1709.0* 806.5*   CBG:  Recent Labs Lab 09/24/12 1604 09/24/12 2112 09/25/12 0624 09/25/12 1150 09/25/12 1641  GLUCAP 277* 223* 125* 143* 193*    Recent Results (from the past 240 hour(s))  CULTURE, BLOOD (ROUTINE X 2)     Status: None   Collection Time    20-Oct-2012  1:46 PM      Result Value Range Status   Specimen Description BLOOD ARM RIGHT   Final   Special Requests     Final   Value: BOTTLES DRAWN AEROBIC AND ANAEROBIC 10CCBLUE 5CCRED   Culture  Setup Time 2012-10-20 17:32   Final   Culture     Final   Value:        BLOOD CULTURE RECEIVED NO GROWTH TO DATE CULTURE WILL BE HELD FOR 5 DAYS BEFORE ISSUING A FINAL NEGATIVE REPORT   Report Status PENDING   Incomplete  CULTURE, BLOOD (ROUTINE X 2)     Status: None   Collection Time    2012-10-20  1:50 PM      Result Value Range Status   Specimen Description BLOOD HAND RIGHT   Final   Special Requests BOTTLES DRAWN AEROBIC ONLY 2.5CC   Final   Culture  Setup Time 10/20/2012 17:32   Final   Culture     Final   Value:        BLOOD CULTURE RECEIVED NO GROWTH TO DATE CULTURE WILL BE HELD FOR 5 DAYS BEFORE ISSUING A FINAL NEGATIVE REPORT   Report Status PENDING   Incomplete  MRSA PCR SCREENING     Status: Abnormal   Collection Time    2012/10/20  5:09 PM      Result Value Range Status   MRSA by PCR POSITIVE (*) NEGATIVE Final   Comment:            The GeneXpert MRSA Assay (FDA     approved for NASAL specimens     only), is one component of a     comprehensive MRSA colonization     surveillance program. It is not     intended to diagnose MRSA     infection nor to guide or     monitor treatment for  MRSA infections.     RESULT CALLED TO, READ BACK BY AND VERIFIED WITH:     Bridget Hartshorn RN [id number] 09/25/2012 A BROWNING  URINE CULTURE     Status: None   Collection Time    09/20/2012  6:20 PM      Result Value Range Status   Specimen Description URINE, CATHETERIZED   Final   Special Requests NONE   Final   Culture  Setup Time 10/09/2012  19:16   Final   Colony Count NO GROWTH   Final   Culture NO GROWTH   Final   Report Status 09/22/2012 FINAL   Final     Studies:  I have reviewed all Recent x-ray studies in detail     If 7PM-7AM, please contact night-coverage [email or web address] Password [email or web address] 09/25/2012, 8:45 PM   LOS: 4 days    Fahmida Jurich C,MD [phone] 09/25/2012, 8:45 PM

## 2012-09-25 NOTE — Progress Notes (Addendum)
Speech Language Pathology Dysphagia Treatment Patient Details Name: [name] KNOTEK Sr. MRN: [id number] DOB: Mar 17, 1933 Today's Date: 09/25/2012 Time: 4098-1191 SLP Time Calculation (min): 19 min  Assessment / Plan / Recommendation Clinical Impression  Pt seen to assess tolerance of po diet and for continued education re: maximizing airway protection with po intake.  Pt observed with water via straw without s/s of aspiration- use of straw allows pt to keep venturi mask in place.  Intake at breakfast approx 25% with better tolerance of pureed than solids due to his quick fatigue.  Pt admits to occasional cough with liquids more than solids (which is consistent with previous BSE finding)- SLP requested spouse order buttermilk for lunch to determine if pt tolerates better than thin.    Mitgation of aspiration appears to be functional goal for this pt with multiple risk factors, deconditoning and chronic respiratory issues.     Pt denies symptoms consistent with prior to last EGD 10/2011, EGD indicated Hiatal hernia, distal esophageal ulcer and bile reflux per report.  Please note, pt's spouse states pt take Nexium "when they can afford it" as they are "on a fixed income".      Diet Recommendation  Continue with Current Diet: Dysphagia 1 (puree);Thin liquid    SLP Plan Continue with current plan of care   Pertinent Vitals/Pain Low grade temp, decreased   Swallowing Goals  SLP Swallowing Goals Patient will utilize recommended strategies during swallow to increase swallowing safety with: Maximum assistance Swallow Study Goal #2 - Progress: Progressing toward goal    Oral Cavity - Oral Hygiene   clear oral cavity, pt chewing gum  Dysphagia Treatment Treatment focused on: Skilled observation of diet tolerance;Patient/family/caregiver education Family/Caregiver Educated: spouse Treatment Methods/Modalities: Skilled observation;Differential diagnosis Patient observed directly with PO's:  Yes Type of PO's observed: Thin liquids Feeding: Total assist (pt on NRB wife feeding him) Liquids provided via: Straw   GO     [name] Burnet, MS [facility] SLP [phone]

## 2012-09-25 NOTE — Progress Notes (Signed)
PULMONARY  / CRITICAL CARE MEDICINE  Name: [name] DELAGUILA Sr. MRN: [id number] DOB: 1932/10/09    ADMISSION DATE:  10/09/2012 CONSULTATION DATE:  09/25/2012  REFERRING MD :  Dewayne Shorter PRIMARY SERVICE:  Internal Medicine  CHIEF COMPLAINT:  SOB  BRIEF PATIENT DESCRIPTION: 79 year old male W/ PMH Chronic resp failure on chronic pred and oxygen), admitted 7/7 with SOB and acute on chronicrespiratory failure (just discharged 7/2).   SIGNIFICANT EVENTS / STUDIES:  7/7 Admit  LINES / TUBES: R CW PAC >>  CULTURES: 7/7 blood x2 >>ngtd 7/7 UC >neg   ANTIBIOTICS:  COmpleted 10 ds of Primaxin 6/30 7/7 Cefepime >> 7/7 7/7 Vancomycin >>?  7/7 Imipenem >>7/10 7/7 Cipro >>?  7/7 Diflucan >>   HISTORY OF PRESENT ILLNESS:  [name] Stevenson is a 79 year old male with a history of COPD and recent ESBL pneumonia. He was discharged on 7/2 (adm 6/12 ) and sustained a fall on day of discharge, with difficulty getting him up. His family notes that he has been SOB since discharge and has been requiring 4 L/min [place] at home. His SOB has continued to worsen and he is bringing up yellow sputum.   His SOB acutely worsened today and EMS was called. On arrival they noted SpO2 in the 80's, and was placed on CPAP by EMS, which was transitioned to BiPaP by the ED. He has received broad spectrum antibiotics while in ED.  PMH  Of NSCLC s/p RULectomy and adjuvant chemo in 2006    has a past medical history of Stricture and stenosis of esophagus (12/07/2004); GERD (gastroesophageal reflux disease); Hypoxemia; Increased prostate specific antigen (PSA) velocity (02/26/2011); DM (diabetes mellitus) (02/26/2011); Gout (02/26/2011); COPD (chronic obstructive pulmonary disease); OSA (obstructive sleep apnea) (02/26/2011); Paroxysmal atrial fibrillation; Depression; Cardiac arrest (2009); Hypertension; HTN (hypertension) (02/26/2011); Cardiac arrest (2009); Non-small cell lung cancer; Basal cell carcinoma of nose (02/26/2011); ETOH abuse  (01/29/2012); CKD (chronic kidney disease) (02/28/2012); Chronic diastolic CHF (congestive heart failure); Sick sinus syndrome; Bradycardia; Paroxysmal VT; CAD (coronary artery disease); Esophageal ulcer; Hiatal hernia; Valvular heart disease; Hemoptysis; Pneumonia; Fungal infection; and Hypoalbuminemia.   has past surgical history that includes Lung lobectomy; Knee arthroscopy; Visual merchandiser; Tonsillectomy; Hernia repair; Cardiac catheterization; Esophagogastroduodenoscopy (10/25/2011); Video bronchoscopy (N/A, 09/01/2012); and Video bronchoscopy (Bilateral, 09/11/2012).    SUBJECTIVE:  Sitting up in chair,  Feels very weak, more dyspnea today  c/o hemotpysis overnight   VITAL SIGNS: Temp:  [98.2 F (36.8 C)-99 F (37.2 C)] 99 F (37.2 C) (07/11 0545) Pulse Rate:  [68-71] 70 (07/11 0545) Resp:  [20-22] 20 (07/11 0545) BP: (130-155)/(75-98) 155/76 mmHg (07/11 1100) SpO2:  [93 %-96 %] 96 % (07/11 0905) FiO2 (%):  [35 %] 35 % (07/11 1216) Weight:  [183 lb 14.4 oz (83.416 kg)] 183 lb 14.4 oz (83.416 kg) (07/11 0545)  PHYSICAL EXAMINATION: General:  Chronically ill-appearing elderly male, in NAD Neuro:  A&Ox3, follows commands HEENT:  No scleral icterus Neck:  No JVD, trachea midline, no carotid bruit Cardiovascular:  RRR, S1 and S2. +2 radial and DP pulses Lungs:  Scattered rhonchi in anterior lung fields Abdomen:  Round, soft, non-tender, active BS Musculoskeletal:  MAE well and equal Skin:  Skin tears on left hand, left knee. Intact otherwise   Recent Labs Lab 09/23/12 0100 09/24/12 1015 09/25/12 1040  NA 129* 134* 138  K 3.3* 3.6 3.4*  CL 88* 90* 95*  CO2 29 30 30   BUN 94* 98* 89*  CREATININE 3.12* 2.48* 2.22*  GLUCOSE 333* 219* 152*    Recent Labs Lab 09/23/12 0100 09/24/12 1010 09/25/12 1040  HGB 10.0* 10.7* 11.2*  HCT 29.9* 31.7* 33.3*  WBC 18.4* 17.9* 12.1*  PLT 270 324 325    Recent Labs Lab 09-25-12 1211  PROBNP 806.5*    Dg Chest 2 View  09/25/2012    *RADIOLOGY REPORT*  Clinical Data: Shortness of breath.  Hemoptysis.  Pleural effusion.  CHEST - 2 VIEW  Comparison: Single view of the chest 2012-09-25 and 09/14/2012.  CT chest 09/08/2012.  Findings: The lungs are emphysematous.  There is cardiomegaly. Pacing device and Port-A-Cath are noted.  Small bilateral pleural effusions persist.  The patient's right pleural effusion appears slightly increased.  There is pulmonary vascular congestion.  IMPRESSION:  1.  Some increase in the patient's small right pleural effusion with no notable change on the left. 2.  Cardiomegaly and vascular congestion. 3.  Emphysema.   Original Report Authenticated By: Holley Dexter, [name]    ASSESSMENT / PLAN:   Acute on Chronic Respiratory Failure multifactoral in nature, likely causes are progressive deconditioning, COPD (not currently in acute exacerbation: O2 and pred dep), heart failure,  Pneumonia, acute on chronic renal failure Appears to have FTT since discharge. He does have h/o esophageal stricture, so this also raises the concern for aspiration.  CXR unchanged from prior with LLL atelectasis vs effusion H/O OSA (nocturnal CPAP) >off Imipenem 7/10 for ESBL last admit , off steroids 7/10   P: - Continue Bipap  QHS - Can transition to nasal cannula for comfort - satn 90% & above acceptable, venti mask for now  - Continue q4h nebs - can taper as he improves - Continue home inhaled steroid regimen - Use lasix as needed to keep euvolemic; track bnp as needed  - If he does improve, would push PT/SNF    RUE DVT - Given recurrence of hemoptysis & prior history, best to dc anticoagulation. Risk of embolisation with UE DVT is small & explained to wife, will have to accept that risk. Can perhaps consider rechallenge in 2-4 wks  All other issues:  A on C renal failure, fluid and electrolyte disturbances, mild Anemia of chronic disease, Afib, GERD, DM w/ Hyperglycemia per Primary service.   GLOBAL DNR Eventual  SNF-rehab, wife prefers continuity - Not suitable for  LTAC/ CIR     PARRETT,TAMMY NP-C [facility]    [phone]   Care during the described time interval was provided by me and/or other providers on the critical care team.  I have reviewed this patient's available data, including medical history, events of note, physical examination and test results as part of my evaluation  I discussed with Dr Julieta Gutting V.  09/25/2012 12:45 PM

## 2012-09-26 DIAGNOSIS — J449 Chronic obstructive pulmonary disease, unspecified: Secondary | ICD-10-CM

## 2012-09-26 LAB — CBC
MCHC: 33.8 g/dL (ref 30.0–36.0)
Platelets: 294 10*3/uL (ref 150–400)
RDW: 19.9 % — ABNORMAL HIGH (ref 11.5–15.5)

## 2012-09-26 LAB — GLUCOSE, CAPILLARY: Glucose-Capillary: 256 mg/dL — ABNORMAL HIGH (ref 70–99)

## 2012-09-26 LAB — BASIC METABOLIC PANEL
GFR calc Af Amer: 39 mL/min — ABNORMAL LOW (ref >90)
GFR calc non Af Amer: 34 mL/min — ABNORMAL LOW (ref >90)
Potassium: 3.1 mEq/L — ABNORMAL LOW (ref 3.5–5.1)
Sodium: 142 mEq/L (ref 135–145)

## 2012-09-26 MED ORDER — FUROSEMIDE 10 MG/ML IJ SOLN
80.0000 mg | Freq: Two times a day (BID) | INTRAMUSCULAR | Status: AC
  Administered 2012-09-26 (×2): 80 mg via INTRAVENOUS
  Filled 2012-09-26 (×2): qty 8

## 2012-09-26 MED ORDER — POTASSIUM CHLORIDE CRYS ER 20 MEQ PO TBCR
60.0000 meq | EXTENDED_RELEASE_TABLET | Freq: Once | ORAL | Status: AC
Start: 2012-09-26 — End: 2012-09-26
  Administered 2012-09-26: 60 meq via ORAL
  Filled 2012-09-26: qty 3

## 2012-09-26 NOTE — Progress Notes (Signed)
Pt. Rested well during the night. No s/s distress or discomfort noted. Pts. Dressing to L hand soiled with blood. RN cleansed area and reapplied new dressing. Wife at bedside. Call light within reach. RN will continue to monitor pt. For changes in condition.

## 2012-09-26 NOTE — Plan of Care (Signed)
Problem: Problem: Cardiovascular Progression Goal: NO EVIDENCE OF VOLUME OVERLOAD Outcome: Not Progressing PT RESTARTED ON IV LASIX TODAY, VERY CONGESTED Goal: NO VASCULAR COMPLICATION Outcome: Not Progressing DVT TO RUA

## 2012-09-26 NOTE — Progress Notes (Signed)
PULMONARY  / CRITICAL CARE MEDICINE  Name: [name] ZIEGER Sr. MRN: [id number] DOB: 03/26/1932    ADMISSION DATE:  09/20/2012 CONSULTATION DATE:  09/15/2012  REFERRING MD :  Dewayne Shorter PRIMARY SERVICE:  Internal Medicine  CHIEF COMPLAINT:  SOB  BRIEF PATIENT DESCRIPTION: 79 year old male W/ PMH Chronic resp failure on chronic pred and oxygen), admitted 7/7 with SOB and acute on chronicrespiratory failure (just discharged 7/2).   SIGNIFICANT EVENTS / STUDIES:  7/7 Admit  LINES / TUBES: R CW PAC >>  CULTURES: 7/7 blood x2 >>ngtd 7/7 UC >neg   ANTIBIOTICS:  COmpleted 10 ds of Primaxin 6/30 7/7 Cefepime >> 7/7 7/7 Vancomycin >>?  7/7 Imipenem >>7/10 7/7 Cipro >>?  7/7 Diflucan >>   HISTORY OF PRESENT ILLNESS:  [name] Stevenson is a 79 year old male with a history of COPD and recent ESBL pneumonia. He was discharged on 7/2 (adm 6/12 ) and sustained a fall on day of discharge, with difficulty getting him up. His family notes that he has been SOB since discharge and has been requiring 4 L/min [place] at home. His SOB has continued to worsen and he is bringing up yellow sputum.   His SOB acutely worsened today and EMS was called. On arrival they noted SpO2 in the 80's, and was placed on CPAP by EMS, which was transitioned to BiPaP by the ED. He has received broad spectrum antibiotics while in ED.  PMH  Of NSCLC s/p RULectomy and adjuvant chemo in 2006    has a past medical history of Stricture and stenosis of esophagus (12/07/2004); GERD (gastroesophageal reflux disease); Hypoxemia; Increased prostate specific antigen (PSA) velocity (02/26/2011); DM (diabetes mellitus) (02/26/2011); Gout (02/26/2011); COPD (chronic obstructive pulmonary disease); OSA (obstructive sleep apnea) (02/26/2011); Paroxysmal atrial fibrillation; Depression; Cardiac arrest (2009); Hypertension; HTN (hypertension) (02/26/2011); Cardiac arrest (2009); Non-small cell lung cancer; Basal cell carcinoma of nose (02/26/2011); ETOH abuse  (01/29/2012); CKD (chronic kidney disease) (02/28/2012); Chronic diastolic CHF (congestive heart failure); Sick sinus syndrome; Bradycardia; Paroxysmal VT; CAD (coronary artery disease); Esophageal ulcer; Hiatal hernia; Valvular heart disease; Hemoptysis; Pneumonia; Fungal infection; and Hypoalbuminemia.   has past surgical history that includes Lung lobectomy; Knee arthroscopy; Visual merchandiser; Tonsillectomy; Hernia repair; Cardiac catheterization; Esophagogastroduodenoscopy (10/25/2011); Video bronchoscopy (N/A, 09/01/2012); and Video bronchoscopy (Bilateral, 09/11/2012).    SUBJECTIVE:  Extremely weak with very poor cough mechanics and poor MCC.  Mild increased wob.  Adequate sats on 35% venti  VITAL SIGNS: Temp:  [97.4 F (36.3 C)-98.9 F (37.2 C)] 98.2 F (36.8 C) (07/12 0653) Pulse Rate:  [78-95] 78 (07/12 0653) Resp:  [18-22] 22 (07/12 0653) BP: (136-159)/(75-87) 157/87 mmHg (07/12 0653) SpO2:  [93 %-97 %] 96 % (07/12 0745) FiO2 (%):  [35 %] 35 % (07/12 0745) Weight:  [81.647 kg (180 lb)] 81.647 kg (180 lb) (07/12 0657)  PHYSICAL EXAMINATION: General:  Chronically ill-appearing elderly male, in NAD Neuro:  A&Ox3, follows commands HEENT:  No scleral icterus Neck: No LN or TMG Cardiovascular:  RRR, no murmur Lungs:  Lots of upper airway noise, basilar crackles, no true wheezing. Abdomen:  Round, soft, non-tender, active BS Musculoskeletal:  Mild edema, no cyanosis Skin:  Skin tears on left hand, left knee.   Recent Labs Lab 09/24/12 1015 09/25/12 1040 09/26/12 0630  NA 134* 138 142  K 3.6 3.4* 3.1*  CL 90* 95* 98  CO2 30 30 33*  BUN 98* 89* 74*  CREATININE 2.48* 2.22* 1.84*  GLUCOSE 219* 152* 178*  Recent Labs Lab 09/24/12 1010 09/25/12 1040 09/26/12 0630  HGB 10.7* 11.2* 11.2*  HCT 31.7* 33.3* 33.1*  WBC 17.9* 12.1* 11.0*  PLT 324 325 294    Recent Labs Lab 10/19/12 1211 09/26/12 0630  PROBNP 806.5* 4060.0*    Dg Chest 2 View  09/25/2012   *RADIOLOGY  REPORT*  Clinical Data: Shortness of breath.  Hemoptysis.  Pleural effusion.  CHEST - 2 VIEW  Comparison: Single view of the chest 10/19/12 and 09/14/2012.  CT chest 09/08/2012.  Findings: The lungs are emphysematous.  There is cardiomegaly. Pacing device and Port-A-Cath are noted.  Small bilateral pleural effusions persist.  The patient's right pleural effusion appears slightly increased.  There is pulmonary vascular congestion.  IMPRESSION:  1.  Some increase in the patient's small right pleural effusion with no notable change on the left. 2.  Cardiomegaly and vascular congestion. 3.  Emphysema.   Original Report Authenticated By: Holley Dexter, [name]    ASSESSMENT / PLAN:   Acute on Chronic Respiratory Failure multifactoral in nature, likely causes are progressive deconditioning, COPD (not currently in acute exacerbation: O2 and pred dep), heart failure,  Pneumonia, acute on chronic renal failure Appears to have FTT since discharge. He does have h/o esophageal stricture, so this also raises the concern for aspiration.  CXR unchanged from prior with LLL atelectasis vs effusion H/O OSA (nocturnal CPAP)  Today, clearly his major issue is worsening weakness and FTT.  He is unable to clear his secretions from his upper airway, thus leading to increased wob.    P: - Continue Bipap  QHS - Can transition to nasal cannula for comfort - satn 90% & above acceptable, venti mask for now  - Continue q4h nebs - can taper as he improves - Continue home inhaled steroid regimen - Use lasix as needed to keep euvolemic; track bnp as needed -it is unclear if he is going to survive this.  If his wob begins to worsen, will need to keep comfortable with as needed morphine and anxiolytics.   RUE DVT - Given recurrence of hemoptysis & prior history, best to dc anticoagulation. Risk of embolisation with UE DVT is small & explained to wife, will have to accept that risk. Can perhaps consider rechallenge in 2-4  wks   GLOBAL DNR Eventual SNF-rehab, wife prefers continuity - Not suitable for  LTAC/ CIR

## 2012-09-26 NOTE — Progress Notes (Signed)
Patient Name: [name] STARACE Sr.      SUBJECTIVE:  resamitted with resp failure with co2 depdt COPD , possible pneumonia general weakness and chronic rate controlled afib and ? Aspiration  Possible component of HFpEF but not clearly so  REcurrent hemoptysis following initiation of anticoagulation for RUE DVT>> anticoagulation d/c  With risk!! Discussed with family  Unable to walk yesterday  Confused then and also today;  Quite sob  Wife says he wants him comfortable  He is DNR and that is appropriated    Past Medical History  Diagnosis Date  . Stricture and stenosis of esophagus 12/07/2004    EGD done on 12/07/2004  . GERD (gastroesophageal reflux disease)   . Hypoxemia     With chronic respiratory failure  . Increased prostate specific antigen (PSA) velocity 02/26/2011  . DM (diabetes mellitus) 02/26/2011  . Gout 02/26/2011  . COPD (chronic obstructive pulmonary disease)     a. oxygen at home, 4L. b. On prednisone.  . OSA (obstructive sleep apnea) 02/26/2011    CPAP  . Paroxysmal atrial fibrillation   . Depression   . Cardiac arrest 2009    while in hospital   . Hypertension   . HTN (hypertension) 02/26/2011  . Cardiac arrest 2009    while in hospital  . Non-small cell lung cancer     a. s/p RUL lobectomy, adjuvant chemo 2006. b. New lung nodule seen 08/2012, OP f/u recommended.  . Basal cell carcinoma of nose 02/26/2011  . ETOH abuse 01/29/2012    started drinking again - hx cardiac arrest in 2009 due to dt's with organ failure  . CKD (chronic kidney disease) 02/28/2012  . Chronic diastolic CHF (congestive heart failure)     a. EF 65-70% 08/2012. b. Complex picture with ascites/anasarca/low albumin/CKD.  . Sick sinus syndrome     a. s/p [employer] pacemaker 2010.  . Bradycardia     a. Admitted 2010 with severe bradycardia, runs of paroxysmal VT.   Marland Kitchen Paroxysmal VT     a. In 2010 in setting of marked bradycardia.  Marland Kitchen CAD (coronary artery disease)     a.  Nonobstructive CAD by cath 2006.  Marland Kitchen Esophageal ulcer     a. By EGD 10/2011.  Marland Kitchen Hiatal hernia     a. By EGD 10/2011.  . Valvular heart disease     a. Mild MR/mild AI/mild AS by echo 05/2011.  Marland Kitchen Hemoptysis     a. 08/2012:  Bronchoscopy 09/01/12 did not determine a source - bronchial washings did not show malignant cells on path. ENT did not find any pathology in the upper most respiratory/gi tract by fiberoptic laryngoscopy. A second bronchoscopy was done on 09/11/12 - this time a lesion along the RML suture line from previous lobectomy was found, but without evidence for malignancy on biopsy.   . Pneumonia     a. Hosp 08/2012 with + ESBL E coli, candida albicans on BAL tx with levaquin, primaxin, diflucan - complex hospitalization with CKD, anasarca, CHF, afib, hemoptysis.  . Fungal infection     a. Rash 08/2012 - felt to be disseminated fungal infection, also treated with Valtrex for possible buttocks shingles.  . Hypoalbuminemia     a. 08/2012.    Scheduled Meds:  Scheduled Meds: . albuterol  2.5 mg Nebulization Q4H  . amiodarone  200 mg Oral BID  . aspirin EC  81 mg Oral Daily  . atorvastatin  40 mg Oral QHS  . Chlorhexidine Gluconate Cloth  6 each Topical Q0600  . citalopram  10 mg Oral Daily  . diltiazem  180 mg Oral BID  . docusate sodium  100 mg Oral BID  . ezetimibe  10 mg Oral Daily  . feeding supplement  237 mL Oral BID BM  . fluconazole  100 mg Oral Daily  . furosemide  60 mg Oral BID  . guaiFENesin  1,200 mg Oral BID  . insulin aspart  0-9 Units Subcutaneous TID WC  . insulin glargine  8 Units Subcutaneous QHS  . ipratropium  0.5 mg Nebulization Q4H  . magnesium oxide  400 mg Oral Daily  . metoprolol succinate  25 mg Oral BID WC  . mineral oil  1 enema Rectal Daily  . multivitamin with minerals  1 tablet Oral Daily  . mupirocin cream   Topical BID  . omega-3 acid ethyl esters  2 g Oral Daily  . pantoprazole  40 mg Oral Daily  . potassium chloride SA  20 mEq Oral Daily  .  senna  1 tablet Oral BID  . sodium chloride  10-40 mL Intracatheter Q12H  . sodium chloride  3 mL Intravenous Q12H  . sodium chloride  3 mL Intravenous Q12H  . vitamin B-12  50 mcg Oral Daily  . vitamin E  400 Units Oral BID   Continuous Infusions:    PHYSICAL EXAM Filed Vitals:   09/26/12 0341 09/26/12 0653 09/26/12 0657 09/26/12 0745  BP:  157/87    Pulse: 86 78    Temp:  98.2 F (36.8 C)    TempSrc:  Oral    Resp: 18 22    Height:      Weight:   180 lb (81.647 kg)   SpO2:  93%  96%    Well developed and nourished on O2HENT normal Neck supple   Coarse  Ecchymosis along r chest wall caudal to the IV site Porta cath access site Regular rate and rhythm, no murmurs or gallops Abd-soft with active BS No Clubbing cyanosis major and prog swelling of RU Extremtiy Skin-wth bruisinry A & Oriented  Grossly normal sensory and motor function   TELEMETRY: Reviewed telemetry pt in afib cvr:    Intake/Output Summary (Last 24 hours) at 09/26/12 0936 Last data filed at 09/26/12 0900  Gross per 24 hour  Intake   1000 ml  Output   4500 ml  Net  -3500 ml    LABS: Basic Metabolic Panel:  Recent Labs Lab 30-Sep-2012 1211 09/22/12 0500 09/23/12 0100 09/24/12 1015 09/25/12 1040 09/26/12 0630  NA 131* 131* 129* 134* 138 142  K 3.1* 3.5 3.3* 3.6 3.4* 3.1*  CL 85* 88* 88* 90* 95* 98  CO2 33* 28 29 30 30  33*  GLUCOSE 194* 259* 333* 219* 152* 178*  BUN 86* 95* 94* 98* 89* 74*  CREATININE 2.75* 3.07* 3.12* 2.48* 2.22* 1.84*  CALCIUM 8.6 9.0 8.7 9.1 9.0 8.9   Cardiac Enzymes: No results found for this basename: CKTOTAL, CKMB, CKMBINDEX, TROPONINI,  in the last 72 hours CBC:  Recent Labs Lab 30-Sep-2012 1211 09/22/12 0500 09/23/12 0100 09/24/12 1010 09/25/12 1040 09/26/12 0630  WBC 18.9* 14.9* 18.4* 17.9* 12.1* 11.0*  NEUTROABS 15.6*  --   --   --   --   --   HGB 11.5* 10.1* 10.0* 10.7* 11.2* 11.2*  HCT 34.1* 30.0* 29.9* 31.7* 33.3* 33.1*  MCV 94.7 93.5 95.5 94.9 95.1  96.2  PLT 242 241 270 324 325 294   PROTIME:  Recent Labs  09/24/12 1015  LABPROT 13.9  INR 1.09   Liver Function Tests: No results found for this basename: AST, ALT, ALKPHOS, BILITOT, PROT, ALBUMIN,  in the last 72 hours No results found for this basename: LIPASE, AMYLASE,  in the last 72 hours BNP: BNP (last 3 results)  Recent Labs  09/11/12 0500 Sep 29, 2012 1211 09/26/12 0630  PROBNP 1709.0* 806.5* 4060.0*     ASSESSMENT AND PLAN:  Active Problems:   CARCINOMA, LUNG, SQUAMOUS CELL   CORONARY ARTERY DISEASE-nonobstructive disease 2006 catheterization   Dysphagia   OSA (obstructive sleep apnea)   HTN (hypertension)   Diabetes type 2, uncontrolled   Atrial fibrillation   CKD (chronic kidney disease), stage III   Acute on chronic diastolic congestive heart failure   Acute-on-chronic respiratory failure   Obstructive chronic bronchitis with exacerbation   Dehydration with hyponatremia   Hypoalbuminemia   Hypoxia  Suspect volume overloaded with increased BNP and falling Cr adjust diuretics RUE DVT noted  Heparin started  And stopped becaseu of recurring hemoptysis Need to elevate RUE He is gradually but steadily worsening Signed, Sherryl Manges MD  09/26/2012

## 2012-09-26 NOTE — Progress Notes (Signed)
Pt resting in bed throughout the day, stated he did not feel like getting OOB today, continues to have hemoptysis, mod amount, family at bedside, right arm wrapped per MD orders, will continue to monitor.

## 2012-09-26 NOTE — Progress Notes (Addendum)
[facility] Progress Note  TEAM 1 - Stepdown/ICU TEAM   Beverely Pace Delawder Sr. FAO:[id number] DOB: 07-06-1932 DOA: 10/11/2012 PCP: [name], MD  Brief narrative: 79 year old male W/ PMH Chronic resp failure on chronic pred and oxygen), admitted 7/7 with SOB and acute on chronic respiratory failure (just discharged 7/2). Pt has been total care since dc and has been unable to ambulate as well. He developed yellow sputum and cough despite completion of anbx's after dc. EMS was called and upon their arrival he was found to have saturations in the 80's so was placed on CPAP. In the ER he was transitioned to BiPAP and was started on broad spectrum anbx's. CXR revealed persistent pleural effusions and ?? LLL infiltrate so he was started on high dose Lasix.   Assessment/Plan: Active Problems:   Acute-on-chronic respiratory failure with Hypoxia due to:   A) Obstructive chronic bronchitis with exacerbation   B)  Acute on chronic diastolic congestive heart failure -clinically does not appear to have focal pneumonia nor CHF -suspect had acute flare of DHF which responded to Lasix since now has successfully weaned from BiPAP noting pt had minimal UOP despite high dose Lasix- he actually appears dry now (see below) -abx  and Lasix were dc'ed on 7/8 per step down rounding team -follow and taper steroids- was on prednisone 10 mg daily at home -pulmonary following for further recs -as recommended per pulm  primaxin was resumed to cover for ESBL Ecoli cultured from BAL/recent pna during recent hospitalization although it is noted that he completed 10days of primaxin 6/30   -Discussed patient again to Dr. Vassie Loll and he stated ok to discontinue the Primaxin, and follow -suspect leukocytosis from steroids - no fever, follow and recheck -cont Diflucan since recent broad spectrum anbx's and high risk for candida -pt with hemoptysis and heparin and coumadin dc'ed per pulm 7/11 -on po lasix resumed today  7/12, cr continuing to trend down today, follow closely.  BNP elevated   CKD (chronic kidney disease), stage III -BUN/Scr worsened with Lasix so it was  dc'ed  and pt gently hydrated on 7/8  -Cr improved, as above now lasix resumed continue to monitor cr closely   Right upper extremity DVT -doppler of legs neg for DVT -Coumadin and heparin d/ced 7/11 as above due to hemoptysis, discussed with Dr Vassie Loll who states he will plan to keep him off anticoagulation for 1week and then follow and reassess for further treatment      Dysphagia/Deconditioning -wife states poor intake pre admit - ST for swallow eval 7/9 -Dysphagia 1 diet but minimal po intake today, follow -Discussed with Dr. Vassie Loll PT recommending SNF, but wife  prefers CIR/LTAC-discussed with social worker and his insurance as will not approve LTAC, CIR declined pt. Await SNF     HTN (hypertension) -BP stable, continue CCB and BB    Diabetes type 2, uncontrolled -was worsened by steroid dose, dc'ed per pulm 7/9 -cont SSI and lantus to 8u,  follow and adjust dose based on CBG and PO intake    Atrial fibrillation -rate controlled with meds -cards following    Dehydration with hyponatremia -urine na <10 in 7/8,  urine osmo low at 362 - improved following hydration and witholding lasix -po lasix  Resumed toady 7/12 to avoid vol overload, follow    CARCINOMA, LUNG, SQUAMOUS CELL  -tx'd 2006    CORONARY ARTERY DISEASE-nonobstructive disease 2006  catheterization  -no sx's    OSA (obstructive sleep  apnea) -supplemental o2, pulm following for further recs    Hypoalbuminemia -- albumin 1.6 -nutrition consulted 7/8   Constipation -resolved with SMOG enema, follow  Hypokalemia -replace k  DVT prophylaxis: SCDs Code Status: DO NOT RESUSCITATE Family Communication: Patient and wife, duaghter at bedside Disposition Plan: Likely to SNFwhen medically stable Isolation: Contact isolation for MRSA PCR positive status as well as  recent treatment for ESBL Escherichia coli pneumonia Nutritional Status: Compromised with severe protein calorie malnutrition please see above  Consultants: Cardiology Pulmonology  Procedures: None  Antibiotics: Maxipime, Cipro and vancomycin 7/7 >>> 7/8  HPI/Subjective: Patient states breathing better today, remains on 35% veni mask with sat 97%. Still with some hemoptysis  Objective: Blood pressure 157/87, pulse 78, temperature 98.2 F (36.8 C), temperature source Oral, resp. rate 22, height 5' 6.5" (1.689 m), weight 81.647 kg (180 lb), SpO2 96.00%.  Intake/Output Summary (Last 24 hours) at 09/26/12 1409 Last data filed at 09/26/12 1000  Gross per 24 hour  Intake    880 ml  Output   2000 ml  Net  -1120 ml     Exam: General: No accessory muscle use, chronically ill appearing Lungs: Scattered rhonchi, no wheezes or crackles, remains on  Ventimask Cardiovascular:regular rate and rhythm without murmur gallop or rub normal S1 and S2, without JVD Abdomen: Nontender, nondistended, soft, bowel sounds positive, no rebound, no ascites, no appreciable mass Extremities: No cyanosis,no edema Neurological: Alert and oriented x 3, moves all extremities x 4 without focal neurological deficits but has diffuse generalized weakness with strength being 3/5, CN 2-12 intact  Scheduled Meds: Scheduled Meds: . albuterol  2.5 mg Nebulization Q4H  . amiodarone  200 mg Oral BID  . aspirin EC  81 mg Oral Daily  . atorvastatin  40 mg Oral QHS  . Chlorhexidine Gluconate Cloth  6 each Topical Q0600  . citalopram  10 mg Oral Daily  . diltiazem  180 mg Oral BID  . docusate sodium  100 mg Oral BID  . ezetimibe  10 mg Oral Daily  . feeding supplement  237 mL Oral BID BM  . fluconazole  100 mg Oral Daily  . furosemide  80 mg Intravenous BID  . guaiFENesin  1,200 mg Oral BID  . insulin aspart  0-9 Units Subcutaneous TID WC  . insulin glargine  8 Units Subcutaneous QHS  . ipratropium  0.5 mg  Nebulization Q4H  . magnesium oxide  400 mg Oral Daily  . metoprolol succinate  25 mg Oral BID WC  . mineral oil  1 enema Rectal Daily  . multivitamin with minerals  1 tablet Oral Daily  . mupirocin cream   Topical BID  . omega-3 acid ethyl esters  2 g Oral Daily  . pantoprazole  40 mg Oral Daily  . potassium chloride SA  20 mEq Oral Daily  . senna  1 tablet Oral BID  . sodium chloride  10-40 mL Intracatheter Q12H  . sodium chloride  3 mL Intravenous Q12H  . sodium chloride  3 mL Intravenous Q12H  . vitamin B-12  50 mcg Oral Daily  . vitamin E  400 Units Oral BID   Continuous Infusions:     Data Reviewed: Basic Metabolic Panel:  Recent Labs Lab 09/22/12 0500 09/23/12 0100 09/24/12 1015 09/25/12 1040 09/26/12 0630  NA 131* 129* 134* 138 142  K 3.5 3.3* 3.6 3.4* 3.1*  CL 88* 88* 90* 95* 98  CO2 28 29 30 30  33*  GLUCOSE 259* 333*  219* 152* 178*  BUN 95* 94* 98* 89* 74*  CREATININE 3.07* 3.12* 2.48* 2.22* 1.84*  CALCIUM 9.0 8.7 9.1 9.0 8.9   Liver Function Tests:  Recent Labs Lab 2012/09/25 1211 09/22/12 0500  AST 31 23  ALT 37 32  ALKPHOS 110 87  BILITOT 0.5 0.4  PROT 5.9* 5.7*  ALBUMIN 2.4* 2.2*   No results found for this basename: LIPASE, AMYLASE,  in the last 168 hours No results found for this basename: AMMONIA,  in the last 168 hours CBC:  Recent Labs Lab 09-25-12 1211 09/22/12 0500 09/23/12 0100 09/24/12 1010 09/25/12 1040 09/26/12 0630  WBC 18.9* 14.9* 18.4* 17.9* 12.1* 11.0*  NEUTROABS 15.6*  --   --   --   --   --   HGB 11.5* 10.1* 10.0* 10.7* 11.2* 11.2*  HCT 34.1* 30.0* 29.9* 31.7* 33.3* 33.1*  MCV 94.7 93.5 95.5 94.9 95.1 96.2  PLT 242 241 270 324 325 294   Cardiac Enzymes:  Recent Labs Lab 09-25-2012 1211 Sep 25, 2012 1932 09/25/12 2219 09/22/12 0419  TROPONINI <0.30 <0.30 <0.30 <0.30   BNP (last 3 results)  Recent Labs  09/11/12 0500 09-25-12 1211 09/26/12 0630  PROBNP 1709.0* 806.5* 4060.0*   CBG:  Recent Labs Lab  09/25/12 0624 09/25/12 1150 09/25/12 1641 09/25/12 2132 09/26/12 0624  GLUCAP 125* 143* 193* 150* 168*    Recent Results (from the past 240 hour(s))  CULTURE, BLOOD (ROUTINE X 2)     Status: None   Collection Time    25-Sep-2012  1:46 PM      Result Value Range Status   Specimen Description BLOOD ARM RIGHT   Final   Special Requests     Final   Value: BOTTLES DRAWN AEROBIC AND ANAEROBIC 10CCBLUE 5CCRED   Culture  Setup Time Sep 25, 2012 17:32   Final   Culture     Final   Value:        BLOOD CULTURE RECEIVED NO GROWTH TO DATE CULTURE WILL BE HELD FOR 5 DAYS BEFORE ISSUING A FINAL NEGATIVE REPORT   Report Status PENDING   Incomplete  CULTURE, BLOOD (ROUTINE X 2)     Status: None   Collection Time    Sep 25, 2012  1:50 PM      Result Value Range Status   Specimen Description BLOOD HAND RIGHT   Final   Special Requests BOTTLES DRAWN AEROBIC ONLY 2.5CC   Final   Culture  Setup Time 2012-09-25 17:32   Final   Culture     Final   Value:        BLOOD CULTURE RECEIVED NO GROWTH TO DATE CULTURE WILL BE HELD FOR 5 DAYS BEFORE ISSUING A FINAL NEGATIVE REPORT   Report Status PENDING   Incomplete  MRSA PCR SCREENING     Status: Abnormal   Collection Time    09/25/2012  5:09 PM      Result Value Range Status   MRSA by PCR POSITIVE (*) NEGATIVE Final   Comment:            The GeneXpert MRSA Assay (FDA     approved for NASAL specimens     only), is one component of a     comprehensive MRSA colonization     surveillance program. It is not     intended to diagnose MRSA     infection nor to guide or     monitor treatment for     MRSA infections.     RESULT CALLED  TO, READ BACK BY AND VERIFIED WITH:     Bridget Hartshorn RN [id number] 10/04/12 A BROWNING  URINE CULTURE     Status: None   Collection Time    10/04/12  6:20 PM      Result Value Range Status   Specimen Description URINE, CATHETERIZED   Final   Special Requests NONE   Final   Culture  Setup Time 2012-10-04 19:16   Final   Colony Count NO GROWTH    Final   Culture NO GROWTH   Final   Report Status 09/22/2012 FINAL   Final     Studies:  I have reviewed all Recent x-ray studies in detail     If 7PM-7AM, please contact night-coverage [email or web address] Password [email or web address] 09/26/2012, 2:09 PM   LOS: 5 days    Vaniyah Lansky C,MD [phone] 09/26/2012, 2:09 PM

## 2012-09-26 NOTE — Plan of Care (Signed)
Problem: Phase I Progression Outcomes Goal: Tolerating diet Outcome: Not Progressing PT REFUSES TO EAT, HAS MANAGED TO DRINK GLUCERNA

## 2012-09-26 NOTE — Progress Notes (Signed)
Patient has swelling below left jaw line. Pain to touch. Non pulsating On call MD notified. Will address with Attending MD in AM.

## 2012-09-27 DIAGNOSIS — R220 Localized swelling, mass and lump, head: Secondary | ICD-10-CM

## 2012-09-27 LAB — CBC
HCT: 33.3 % — ABNORMAL LOW (ref 39.0–52.0)
Hemoglobin: 11.1 g/dL — ABNORMAL LOW (ref 13.0–17.0)
MCH: 32.6 pg (ref 26.0–34.0)
MCHC: 33.3 g/dL (ref 30.0–36.0)

## 2012-09-27 LAB — CULTURE, BLOOD (ROUTINE X 2): Culture: NO GROWTH

## 2012-09-27 LAB — BASIC METABOLIC PANEL
BUN: 70 mg/dL — ABNORMAL HIGH (ref 6–23)
CO2: 29 mEq/L (ref 19–32)
Calcium: 8.9 mg/dL (ref 8.4–10.5)
GFR calc non Af Amer: 32 mL/min — ABNORMAL LOW (ref >90)
Glucose, Bld: 151 mg/dL — ABNORMAL HIGH (ref 70–99)
Potassium: 3.6 mEq/L (ref 3.5–5.1)

## 2012-09-27 LAB — GLUCOSE, CAPILLARY

## 2012-09-27 NOTE — Consult Note (Signed)
WOC consult Note Reason for Consult:left hand skin tear (avulsion). Wife states that patient fell at home and hit hand on night-stand. Wound type:traumatic Pressure Ulcer POA: No Measurement:5cm x 6cm x .2cm Wound bed:75% necrotic (yellow slough); avulsed area has been rolled down and covers 25% of tissue injury Drainage (amount, consistency, odor) yellow Periwound:intact, bruised Dressing procedure/placement/frequency: I will implement a conservative POC using white petrolatum as it is the family's main goal to have the dressing not adhere and removal to be atraumatic.  (Family had to be convinced I could remove dressing without causing pain and were contemplating leaving the original dressing on for that reason.) I will not follow, but will remain available for this and the other wounds as identified by my partner earlier this week as needed.  Please re-consult if needed. Thanks, Ladona Mow, MSN, RN, GNP, [place], CWON-AP [phone])

## 2012-09-27 NOTE — Progress Notes (Signed)
PT RESTING IN BED TODAY,  ALERT AND ORIENTED, FAMILY AT BEDSIDE, WILL CONTINUE TO MONITOR

## 2012-09-27 NOTE — Plan of Care (Signed)
Problem: Phase I Progression Outcomes Goal: Tolerating diet Outcome: Not Progressing PT EATING LESS THAN 25% WITH EACH MEAL

## 2012-09-27 NOTE — Progress Notes (Signed)
Switched pt from venti mask 9L O2 to nasal cannula  2L. Sats btw 92-95% on 2L nasal cannula.  Pt is comfortable and resting. Will continue to monitor pt.

## 2012-09-27 NOTE — Progress Notes (Signed)
Thank you for consulting the Palliative Medicine Team at [facility] to meet your patient's and family's needs.   The reason that you asked [place] to see your patient is  For GOC  We have scheduled your patient for a meeting: 7/14 @ 830 am  The Surrogate decision make is: Spouse Associate Professor information:  Other family members that need to be present: Sons    Your patient is able/unable to participate:  Additional Narrative:     Lyndol Vanderheiden L. Ladona Ridgel, MD MBA The Palliative Medicine Team at [facility] Phone: [phone] Pager: [phone]

## 2012-09-27 NOTE — Plan of Care (Signed)
Problem: Problem: Cardiovascular Progression Goal: NO VASCULAR COMPLICATION Outcome: Not Progressing DVT TO RT ARM

## 2012-09-27 NOTE — Progress Notes (Signed)
Patient Name: [name] WORTMANN Sr.      SUBJECTIVE:  resamitted with resp failure with co2 depdt COPD , possible pneumonia general weakness and chronic rate controlled afib and ? Aspiration  Possible component of HFpEF but not clearly so  REcurrent hemoptysis following initiation of anticoagulation for RUE DVT>> anticoagulation d/c  With risk!! Discussed with family  Unable to walk yesterday  Confused yesterday  Better today  Quite sob pain in left neck with mass  Wife says he wants him comfortable  He is DNR and that is appropriated    Past Medical History  Diagnosis Date  . Stricture and stenosis of esophagus 12/07/2004    EGD done on 12/07/2004  . GERD (gastroesophageal reflux disease)   . Hypoxemia     With chronic respiratory failure  . Increased prostate specific antigen (PSA) velocity 02/26/2011  . DM (diabetes mellitus) 02/26/2011  . Gout 02/26/2011  . COPD (chronic obstructive pulmonary disease)     a. oxygen at home, 4L. b. On prednisone.  . OSA (obstructive sleep apnea) 02/26/2011    CPAP  . Paroxysmal atrial fibrillation   . Depression   . Cardiac arrest 2009    while in hospital   . Hypertension   . HTN (hypertension) 02/26/2011  . Cardiac arrest 2009    while in hospital  . Non-small cell lung cancer     a. s/p RUL lobectomy, adjuvant chemo 2006. b. New lung nodule seen 08/2012, OP f/u recommended.  . Basal cell carcinoma of nose 02/26/2011  . ETOH abuse 01/29/2012    started drinking again - hx cardiac arrest in 2009 due to dt's with organ failure  . CKD (chronic kidney disease) 02/28/2012  . Chronic diastolic CHF (congestive heart failure)     a. EF 65-70% 08/2012. b. Complex picture with ascites/anasarca/low albumin/CKD.  . Sick sinus syndrome     a. s/p [employer]tronic pacemaker 2010.  . Bradycardia     a. Admitted 2010 with severe bradycardia, runs of paroxysmal VT.   Marland Kitchen Paroxysmal VT     a. In 2010 in setting of marked bradycardia.  Marland Kitchen CAD (coronary  artery disease)     a. Nonobstructive CAD by cath 2006.  Marland Kitchen Esophageal ulcer     a. By EGD 10/2011.  Marland Kitchen Hiatal hernia     a. By EGD 10/2011.  . Valvular heart disease     a. Mild MR/mild AI/mild AS by echo 05/2011.  Marland Kitchen Hemoptysis     a. 08/2012:  Bronchoscopy 09/01/12 did not determine a source - bronchial washings did not show malignant cells on path. ENT did not find any pathology in the upper most respiratory/gi tract by fiberoptic laryngoscopy. A second bronchoscopy was done on 09/11/12 - this time a lesion along the RML suture line from previous lobectomy was found, but without evidence for malignancy on biopsy.   . Pneumonia     a. Hosp 08/2012 with + ESBL E coli, candida albicans on BAL tx with levaquin, primaxin, diflucan - complex hospitalization with CKD, anasarca, CHF, afib, hemoptysis.  . Fungal infection     a. Rash 08/2012 - felt to be disseminated fungal infection, also treated with Valtrex for possible buttocks shingles.  . Hypoalbuminemia     a. 08/2012.    Scheduled Meds:  Scheduled Meds: . albuterol  2.5 mg Nebulization Q4H  . amiodarone  200 mg Oral BID  . aspirin EC  81 mg Oral Daily  . atorvastatin  40 mg Oral  QHS  . citalopram  10 mg Oral Daily  . diltiazem  180 mg Oral BID  . docusate sodium  100 mg Oral BID  . ezetimibe  10 mg Oral Daily  . feeding supplement  237 mL Oral BID BM  . fluconazole  100 mg Oral Daily  . guaiFENesin  1,200 mg Oral BID  . insulin aspart  0-9 Units Subcutaneous TID WC  . insulin glargine  8 Units Subcutaneous QHS  . ipratropium  0.5 mg Nebulization Q4H  . magnesium oxide  400 mg Oral Daily  . metoprolol succinate  25 mg Oral BID WC  . mineral oil  1 enema Rectal Daily  . multivitamin with minerals  1 tablet Oral Daily  . mupirocin cream   Topical BID  . omega-3 acid ethyl esters  2 g Oral Daily  . pantoprazole  40 mg Oral Daily  . potassium chloride SA  20 mEq Oral Daily  . senna  1 tablet Oral BID  . sodium chloride  10-40 mL  Intracatheter Q12H  . sodium chloride  3 mL Intravenous Q12H  . sodium chloride  3 mL Intravenous Q12H  . vitamin B-12  50 mcg Oral Daily  . vitamin E  400 Units Oral BID   Continuous Infusions:    PHYSICAL EXAM Filed Vitals:   09/26/12 2025 09/26/12 2128 09/26/12 2347 09/27/12 0427  BP:  148/76  144/74  Pulse:  84 72 86  Temp:  98 F (36.7 C)  98 F (36.7 C)  TempSrc:  Oral  Oral  Resp:  22 20 20   Height:      Weight:    179 lb 0.2 oz (81.2 kg)  SpO2: 98% 94%  94%    Well developed and nourished on BiPAP HENT normal   Coarse  Ecchymosis along r chest wall caudal to the IV site Left neck mass along SCmastoid, tender and extending to the angle of the jaw Porta cath access site Regular rate and rhythm, no murmurs or gallops Abd-soft with active BS No Clubbing cyanosis major and prog swelling of RU Extremtiy Skin-wth bruisinry A & Oriented  Grossly normal sensory and motor function   TELEMETRY: Reviewed telemetry pt in afib cvr:    Intake/Output Summary (Last 24 hours) at 09/27/12 0802 Last data filed at 09/27/12 0100  Gross per 24 hour  Intake    480 ml  Output   2750 ml  Net  -2270 ml    LABS: Basic Metabolic Panel:  Recent Labs Lab 09/19/2012 1211 09/22/12 0500 09/23/12 0100 09/24/12 1015 09/25/12 1040 09/26/12 0630 09/27/12 0535  NA 131* 131* 129* 134* 138 142 140  K 3.1* 3.5 3.3* 3.6 3.4* 3.1* 3.6  CL 85* 88* 88* 90* 95* 98 99  CO2 33* 28 29 30 30  33* 29  GLUCOSE 194* 259* 333* 219* 152* 178* 151*  BUN 86* 95* 94* 98* 89* 74* 70*  CREATININE 2.75* 3.07* 3.12* 2.48* 2.22* 1.84* 1.90*  CALCIUM 8.6 9.0 8.7 9.1 9.0 8.9 8.9   Cardiac Enzymes: No results found for this basename: CKTOTAL, CKMB, CKMBINDEX, TROPONINI,  in the last 72 hours CBC:  Recent Labs Lab 10/04/2012 1211 09/22/12 0500 09/23/12 0100 09/24/12 1010 09/25/12 1040 09/26/12 0630 09/27/12 0535  WBC 18.9* 14.9* 18.4* 17.9* 12.1* 11.0* 15.2*  NEUTROABS 15.6*  --   --   --   --    --   --   HGB 11.5* 10.1* 10.0* 10.7* 11.2* 11.2* 11.1*  HCT  34.1* 30.0* 29.9* 31.7* 33.3* 33.1* 33.3*  MCV 94.7 93.5 95.5 94.9 95.1 96.2 97.9  PLT 242 241 270 324 325 294 272   PROTIME:  Recent Labs  09/24/12 1015  LABPROT 13.9  INR 1.09   Liver Function Tests: No results found for this basename: AST, ALT, ALKPHOS, BILITOT, PROT, ALBUMIN,  in the last 72 hours No results found for this basename: LIPASE, AMYLASE,  in the last 72 hours BNP: BNP (last 3 results)  Recent Labs  09/11/12 0500 09/28/2012 1211 09/26/12 0630  PROBNP 1709.0* 806.5* 4060.0*     ASSESSMENT AND PLAN:  Active Problems:   CARCINOMA, LUNG, SQUAMOUS CELL   CORONARY ARTERY DISEASE-nonobstructive disease 2006 catheterization   Dysphagia   OSA (obstructive sleep apnea)   HTN (hypertension)   Diabetes type 2, uncontrolled   Atrial fibrillation   CKD (chronic kidney disease), stage III   Acute on chronic diastolic congestive heart failure   Acute-on-chronic respiratory failure   Obstructive chronic bronchitis with exacerbation   Dehydration with hyponatremia   Hypoalbuminemia   Hypoxia  Continue gentle diuresis, followin Cr RUE DVT noted  Heparin started  And stopped becaseu of recurring hemoptysis Need to elevate RUE He is gradually but steadily worsening There is a new mass in the Left neck   ?  Bleed given abruptness will discuss with IM re imaging Signed, Sherryl Manges MD  09/27/2012

## 2012-09-27 NOTE — Progress Notes (Signed)
Placed patient on CPAP via auto-mode with oxygen set at 3lpm. Sp02 at this time is 96%

## 2012-09-27 NOTE — Progress Notes (Signed)
[facility] Progress Note  TEAM 1 - Stepdown/ICU TEAM   [name] Pace Odor Sr. OZH:[id number] DOB: 04-01-1932 DOA: 10/09/2012 PCP: [name], MD  Brief narrative: 79 year old male W/ PMH Chronic resp failure on chronic pred and oxygen), admitted 7/7 with SOB and acute on chronic respiratory failure (just discharged 7/2). Pt has been total care since dc and has been unable to ambulate as well. He developed yellow sputum and cough despite completion of anbx's after dc. EMS was called and upon their arrival he was found to have saturations in the 80's so was placed on CPAP. In the ER he was transitioned to BiPAP and was started on broad spectrum anbx's. CXR revealed persistent pleural effusions and ?? LLL infiltrate so he was started on high dose Lasix.   Assessment/Plan: Active Problems:   Acute-on-chronic respiratory failure with Hypoxia due to:   A) Obstructive chronic bronchitis with exacerbation   B)  Acute on chronic diastolic congestive heart failure -clinically does not appear to have focal pneumonia nor CHF -suspect had acute flare of DHF which responded to Lasix since now has successfully weaned from BiPAP noting pt had minimal UOP despite high dose Lasix- he actually appears dry now (see below) -abx  and Lasix were dc'ed on 7/8 per step down rounding team -follow and taper steroids- was on prednisone 10 mg daily at home -pulmonary following for further recs -as recommended per pulm  primaxin was resumed to cover for ESBL Ecoli cultured from BAL/recent pna during recent hospitalization although it is noted that he completed 10days of primaxin 6/30   -Discussed patient again to Dr. Vassie Loll and he stated ok to discontinue the Primaxin, and follow -suspect leukocytosis from steroids - no fever, follow and recheck -cont Diflucan since recent broad spectrum anbx's and high risk for candida -pt with hemoptysis and heparin and coumadin dc'ed per pulm 7/11 -on po lasix resumed today  7/12, continue to follow cr closely.     CKD (chronic kidney disease), stage III -BUN/Scr worsened with Lasix so it was  dc'ed  and pt gently hydrated on 7/8  -Cr stable, as above continue lasix and follow   Right upper extremity DVT -doppler of legs neg for DVT -Coumadin and heparin d/ced 7/11 as above due to hemoptysis, discussed with Dr Vassie Loll who stated he will plan to keep him off anticoagulation for 1week and then follow and reassess for further treatment      Dysphagia/Deconditioning -wife states poor intake pre admit - ST for swallow eval 7/9 -Dysphagia 1 diet but minimal po intake today, follow -Discussed with Dr. Vassie Loll PT recommending SNF, but wife  prefers CIR/LTAC-discussed with social worker and his insurance as will not approve LTAC, CIR declined pt. Await SNF     HTN (hypertension) -BP stable, continue CCB and BB    Diabetes type 2, uncontrolled -was worsened by steroid dose, dc'ed per pulm 7/9 -cont SSI and lantus to 8u,  follow and adjust dose based on CBG and PO intake    Atrial fibrillation -rate controlled with meds -cards following    Dehydration with hyponatremia -urine na <10 in 7/8,  urine osmo low at 362 - improved following hydration and witholding lasix -po lasix  Resumed  7/12 to avoid vol overload, follow    CARCINOMA, LUNG, SQUAMOUS CELL  -tx'd 2006    CORONARY ARTERY DISEASE-nonobstructive disease 2006  catheterization  -no sx's    OSA (obstructive sleep apnea) -supplemental o2, pulm following for further recs  Hypoalbuminemia - albumin 1.6 -nutrition consulted 7/8   Constipation -resolved with SMOG enema, follow  Hypokalemia -replace k  L. Sided neck swelling/mass  -obtain US to further eval  Discussed palliative care with wife as she states that above all she is interested in pt being comfortable- she agrees to palliative consult for GOC.  DVT prophylaxis: SCDs Code Status: DO NOT RESUSCITATE Family Communication: Patient and  wife, duaghter at bedside Disposition Plan: Likely to SNFwhen medically stable Isolation: Contact isolation for MRSA PCR positive status as well as recent treatment for ESBL Escherichia coli pneumonia Nutritional Status: Compromised with severe protein calorie malnutrition please see above  Consultants: Cardiology Pulmonology  Procedures: None  Antibiotics: Maxipime, Cipro and vancomycin 7/7 >>> 7/8  HPI/Subjective: Patient still with some dyspnea, per wife new swelling noted on L. Side of neck today.  Objective: Blood pressure 144/74, pulse 86, temperature 98 F (36.7 C), temperature source Oral, resp. rate 20, height 5' 6.5" (1.689 m), weight 81.2 kg (179 lb 0.2 oz), SpO2 96.00%.  Intake/Output Summary (Last 24 hours) at 09/27/12 1244 Last data filed at 09/27/12 1200  Gross per 24 hour  Intake    360 ml  Output   2150 ml  Net  -1790 ml     Exam: General: No accessory muscle use, chronically ill appearing Lungs: Scattered rhonchi, no wheezes or crackles, remains on  Ventimask Cardiovascular:regular rate and rhythm without murmur gallop or rub normal S1 and S2, without JVD Abdomen: Nontender, nondistended, soft, bowel sounds positive, no rebound, no ascites, no appreciable mass Extremities: No cyanosis,no edema in LE, decreasing RUE edema Neurological: Alert and oriented x 3, moves all extremities x 4 without focal neurological deficits but has diffuse generalized weakness with strength being 3/5, CN 2-12 intact  Scheduled Meds: Scheduled Meds: . albuterol  2.5 mg Nebulization Q4H  . amiodarone  200 mg Oral BID  . aspirin EC  81 mg Oral Daily  . atorvastatin  40 mg Oral QHS  . citalopram  10 mg Oral Daily  . diltiazem  180 mg Oral BID  . docusate sodium  100 mg Oral BID  . ezetimibe  10 mg Oral Daily  . feeding supplement  237 mL Oral BID BM  . fluconazole  100 mg Oral Daily  . guaiFENesin  1,200 mg Oral BID  . insulin aspart  0-9 Units Subcutaneous TID WC  .  insulin glargine  8 Units Subcutaneous QHS  . ipratropium  0.5 mg Nebulization Q4H  . magnesium oxide  400 mg Oral Daily  . metoprolol succinate  25 mg Oral BID WC  . mineral oil  1 enema Rectal Daily  . multivitamin with minerals  1 tablet Oral Daily  . mupirocin cream   Topical BID  . omega-3 acid ethyl esters  2 g Oral Daily  . pantoprazole  40 mg Oral Daily  . potassium chloride SA  20 mEq Oral Daily  . senna  1 tablet Oral BID  . sodium chloride  10-40 mL Intracatheter Q12H  . sodium chloride  3 mL Intravenous Q12H  . sodium chloride  3 mL Intravenous Q12H  . vitamin B-12  50 mcg Oral Daily  . vitamin E  400 Units Oral BID   Continuous Infusions:     Data Reviewed: Basic Metabolic Panel:  Recent Labs Lab 09/23/12 0100 09/24/12 1015 09/25/12 1040 09/26/12 0630 09/27/12 0535  NA 129* 134* 138 142 140  K 3.3* 3.6 3.4* 3.1* 3.6  CL 88* 90*  95* 98 99  CO2 29 30 30  33* 29  GLUCOSE 333* 219* 152* 178* 151*  BUN 94* 98* 89* 74* 70*  CREATININE 3.12* 2.48* 2.22* 1.84* 1.90*  CALCIUM 8.7 9.1 9.0 8.9 8.9   Liver Function Tests:  Recent Labs Lab 09/19/2012 1211 09/22/12 0500  AST 31 23  ALT 37 32  ALKPHOS 110 87  BILITOT 0.5 0.4  PROT 5.9* 5.7*  ALBUMIN 2.4* 2.2*   No results found for this basename: LIPASE, AMYLASE,  in the last 168 hours No results found for this basename: AMMONIA,  in the last 168 hours CBC:  Recent Labs Lab 09/25/2012 1211  09/23/12 0100 09/24/12 1010 09/25/12 1040 09/26/12 0630 09/27/12 0535  WBC 18.9*  < > 18.4* 17.9* 12.1* 11.0* 15.2*  NEUTROABS 15.6*  --   --   --   --   --   --   HGB 11.5*  < > 10.0* 10.7* 11.2* 11.2* 11.1*  HCT 34.1*  < > 29.9* 31.7* 33.3* 33.1* 33.3*  MCV 94.7  < > 95.5 94.9 95.1 96.2 97.9  PLT 242  < > 270 324 325 294 272  < > = values in this interval not displayed. Cardiac Enzymes:  Recent Labs Lab 09/19/2012 1211 10/03/2012 1932 09/20/2012 2219 09/22/12 0419  TROPONINI <0.30 <0.30 <0.30 <0.30   BNP (last  3 results)  Recent Labs  09/11/12 0500 09/28/2012 1211 09/26/12 0630  PROBNP 1709.0* 806.5* 4060.0*   CBG:  Recent Labs Lab 09/26/12 0624 09/26/12 1118 09/26/12 1640 09/26/12 2116 09/27/12 0621  GLUCAP 168* 271* 136* 256* 148*    Recent Results (from the past 240 hour(s))  CULTURE, BLOOD (ROUTINE X 2)     Status: None   Collection Time    10/07/2012  1:46 PM      Result Value Range Status   Specimen Description BLOOD ARM RIGHT   Final   Special Requests     Final   Value: BOTTLES DRAWN AEROBIC AND ANAEROBIC 10CCBLUE 5CCRED   Culture  Setup Time 09/28/2012 17:32   Final   Culture NO GROWTH 5 DAYS   Final   Report Status 09/27/2012 FINAL   Final  CULTURE, BLOOD (ROUTINE X 2)     Status: None   Collection Time    09/28/2012  1:50 PM      Result Value Range Status   Specimen Description BLOOD HAND RIGHT   Final   Special Requests BOTTLES DRAWN AEROBIC ONLY 2.5CC   Final   Culture  Setup Time 10/13/2012 17:32   Final   Culture NO GROWTH 5 DAYS   Final   Report Status 09/27/2012 FINAL   Final  MRSA PCR SCREENING     Status: Abnormal   Collection Time    10/03/2012  5:09 PM      Result Value Range Status   MRSA by PCR POSITIVE (*) NEGATIVE Final   Comment:            The GeneXpert MRSA Assay (FDA     approved for NASAL specimens     only), is one component of a     comprehensive MRSA colonization     surveillance program. It is not     intended to diagnose MRSA     infection nor to guide or     monitor treatment for     MRSA infections.     RESULT CALLED TO, READ BACK BY AND VERIFIED WITH:     M PIEL  RN [id number] 09-24-2012 A BROWNING  URINE CULTURE     Status: None   Collection Time    Sep 24, 2012  6:20 PM      Result Value Range Status   Specimen Description URINE, CATHETERIZED   Final   Special Requests NONE   Final   Culture  Setup Time Sep 24, 2012 19:16   Final   Colony Count NO GROWTH   Final   Culture NO GROWTH   Final   Report Status 09/22/2012 FINAL   Final      Studies:  I have reviewed all Recent x-ray studies in detail     If 7PM-7AM, please contact night-coverage [email or web address] Password [email or web address] 09/27/2012, 12:44 PM   LOS: 6 days    Emani Morad C,MD [phone] 09/27/2012, 12:44 PM

## 2012-09-28 DIAGNOSIS — R042 Hemoptysis: Secondary | ICD-10-CM

## 2012-09-28 DIAGNOSIS — R0609 Other forms of dyspnea: Secondary | ICD-10-CM

## 2012-09-28 DIAGNOSIS — Z515 Encounter for palliative care: Secondary | ICD-10-CM

## 2012-09-28 DIAGNOSIS — K1370 Unspecified lesions of oral mucosa: Secondary | ICD-10-CM

## 2012-09-28 LAB — CBC
HCT: 34 % — ABNORMAL LOW (ref 39.0–52.0)
Hemoglobin: 10.8 g/dL — ABNORMAL LOW (ref 13.0–17.0)
MCV: 99.4 fL (ref 78.0–100.0)
RDW: 20.6 % — ABNORMAL HIGH (ref 11.5–15.5)
WBC: 20.6 10*3/uL — ABNORMAL HIGH (ref 4.0–10.5)

## 2012-09-28 LAB — BASIC METABOLIC PANEL
BUN: 64 mg/dL — ABNORMAL HIGH (ref 6–23)
CO2: 32 mEq/L (ref 19–32)
Chloride: 99 mEq/L (ref 96–112)
Creatinine, Ser: 1.78 mg/dL — ABNORMAL HIGH (ref 0.50–1.35)
GFR calc Af Amer: 41 mL/min — ABNORMAL LOW (ref >90)
Glucose, Bld: 172 mg/dL — ABNORMAL HIGH (ref 70–99)
Potassium: 3.9 mEq/L (ref 3.5–5.1)

## 2012-09-28 LAB — BLOOD GAS, ARTERIAL
Acid-Base Excess: 5.4 mmol/L — ABNORMAL HIGH (ref 0.0–2.0)
Delivery systems: POSITIVE
Drawn by: 252031
Expiratory PAP: 10
Inspiratory PAP: 10
Mode: POSITIVE
O2 Content: 10 L/min
O2 Saturation: 86.2 %
Patient temperature: 98.6
pO2, Arterial: 52.8 mmHg — ABNORMAL LOW (ref 80.0–100.0)

## 2012-09-28 LAB — GLUCOSE, CAPILLARY

## 2012-09-28 MED ORDER — LORAZEPAM 2 MG/ML IJ SOLN
0.5000 mg | Freq: Four times a day (QID) | INTRAMUSCULAR | Status: DC
  Administered 2012-09-28 – 2012-09-29 (×3): 0.5 mg via INTRAVENOUS
  Filled 2012-09-28 (×3): qty 1

## 2012-09-28 MED ORDER — ATROPINE SULFATE 1 % OP SOLN
4.0000 [drp] | OPHTHALMIC | Status: DC | PRN
  Administered 2012-09-28 – 2012-09-29 (×2): 4 [drp] via SUBLINGUAL
  Filled 2012-09-28: qty 2

## 2012-09-28 MED ORDER — SCOPOLAMINE 1 MG/3DAYS TD PT72
1.0000 | MEDICATED_PATCH | TRANSDERMAL | Status: DC
  Administered 2012-09-28: 1.5 mg via TRANSDERMAL
  Filled 2012-09-28: qty 1

## 2012-09-28 MED ORDER — MORPHINE SULFATE 25 MG/ML IV SOLN
1.0000 mg/h | INTRAVENOUS | Status: DC
  Administered 2012-09-28 – 2012-09-29 (×14): 1 mg/h via INTRAVENOUS
  Filled 2012-09-28: qty 10

## 2012-09-28 MED ORDER — MORPHINE BOLUS VIA INFUSION
1.0000 mg | INTRAVENOUS | Status: DC | PRN
  Filled 2012-09-28: qty 1

## 2012-09-28 MED ORDER — METOPROLOL TARTRATE 1 MG/ML IV SOLN
5.0000 mg | Freq: Four times a day (QID) | INTRAVENOUS | Status: DC
  Administered 2012-09-28 – 2012-09-29 (×3): 5 mg via INTRAVENOUS
  Filled 2012-09-28 (×7): qty 5

## 2012-09-28 MED ORDER — LORAZEPAM 2 MG/ML IJ SOLN
1.0000 mg | INTRAMUSCULAR | Status: DC | PRN
  Administered 2012-09-28: 1 mg via INTRAVENOUS
  Filled 2012-09-28: qty 1

## 2012-09-28 MED ORDER — MINERAL OIL RE ENEM: 1.0000 | ENEMA | Freq: Every day | RECTAL | Status: DC | PRN

## 2012-09-28 NOTE — Progress Notes (Signed)
PT Cancellation and Dishcharge Note  Patient Details Name: [name] THIBAULT Sr. MRN: [id number] DOB: 04/02/1932   Cancelled Treatment:    Reason Eval/Treat Not Completed: Patient not medically ready. Pt and patient family underwent palliative care meeting and are treating him comfort care only. Family does not wish to proceed with PT. PT signing off at this time.   Marcene Brawn 09/28/2012, 11:54 AM

## 2012-09-28 NOTE — Progress Notes (Signed)
Pt. Has increased work or breathing. Morphine dose increased to 4mg  per MD order.

## 2012-09-28 NOTE — Progress Notes (Signed)
Patient developed some increased shortness of breath and decrease in Sp02 levels. Patient placed on venturi mask at 50%. Patient nasal suctioned for small amount of thick yellow sputum. Patient placed back on CPAP set at 10cmH20 with oxygen set at 10 LPM. Patient resting but still showed labored breathing. Arterial blood gas obtained to monitor ph level as well as CO2 level. After results patients oxygen was increased to 12lpm with Sp02=94%. After 15 minutes patients oxygen was decreased to 9 LPM with Sp02=93%

## 2012-09-28 NOTE — Progress Notes (Signed)
[facility] Progress Note    [name] VANSICKLE Sr. ZOX:[id number] DOB: 1932/04/03 DOA: 10/06/12 PCP: [name], MD  Brief narrative: 79 year old male W/ PMH Chronic resp failure on chronic pred and oxygen), admitted 7/7 with SOB and acute on chronic respiratory failure (just discharged 7/2). Pt has been total care since dc and has been unable to ambulate as well. He developed yellow sputum and cough despite completion of anbx's after dc. EMS was called and upon their arrival he was found to have saturations in the 80's so was placed on CPAP. In the ER he was transitioned to BiPAP and was started on broad spectrum anbx's. CXR revealed persistent pleural effusions and ?? LLL infiltrate so he was started on high dose Lasix.   Assessment/Plan: Active Problems:   Acute-on-chronic respiratory failure with Hypoxia due to:   A) Obstructive chronic bronchitis with exacerbation   B)  Acute on chronic diastolic congestive heart failure -clinically does not appear to have focal pneumonia nor CHF -suspect had acute flare of DHF which responded to Lasix since now has successfully weaned from BiPAP noting pt had minimal UOP despite high dose Lasix- he actually appears dry now (see below) -abx  and Lasix were dc'ed on 7/8 per step down rounding team -as recommended per pulm  primaxin was resumed to cover for ESBL Ecoli cultured from BAL/recent pna during recent hospitalization although it is noted that he completed 10days of primaxin 6/30   -Discussed patient again to Dr. Vassie Loll and he stated ok to discontinue the Primaxin and this was done -suspect leukocytosis from steroids - no fever, follow and recheck -cont Diflucan since recent broad spectrum anbx's and high risk for candida -pt with hemoptysis and heparin and coumadin dc'ed per pulm 7/11 -on po lasix resumed 7/12, cr continuing to improve -Palliative GOC meeting today>> pt and family decided on full comfort measures>> started on morphine &ativan  prn,  BIPAP dc'ed, and meds non essential to his comfort dc'ed    CKD (chronic kidney disease), stage III -BUN/Scr worsened with Lasix so it was  dc'ed  and pt gently hydrated on 7/8  -Cr stable, pt now full comfort care   Right upper extremity DVT -doppler of legs neg for DVT -Coumadin and heparin d/ced 7/11 as above due to hemoptysis, discussed with Dr Vassie Loll who stated he will plan to keep him off anticoagulation for 1week and then follow and reassess for further treatment, but pt is now comfort measures.     Dysphagia/Deconditioning -wife states poor intake pre admit - ST for swallow eval 7/9 -Dysphagia 1 diet but minimal po intake today, follow -Discussed with Dr. Vassie Loll PT recommending SNF, but wife  prefers CIR/LTAC-discussed with social worker and his insurance as will not approve LTAC, CIR declined pt.  -Pt is now full comfort care/hospice, with poor prognosis     HTN (hypertension) -comfort care, now on iv metoprolol    Diabetes type 2, uncontrolled -was worsened by steroid dose, dc'ed per pulm 7/9 -comfort care    Atrial fibrillation -rate controlled with meds -as above-comfort care    Dehydration with hyponatremia -urine na <10 in 7/8,  urine osmo low at 362 - improved following hydration and witholding lasix -po lasix  Resumed  7/12 to avoid vol overload -now comfort measures, follow    CARCINOMA, LUNG, SQUAMOUS CELL  -tx'd 2006    CORONARY ARTERY DISEASE-nonobstructive disease 2006  catheterization  -no sx's    OSA (obstructive sleep apnea) -supplemental o2,  pulm following for further recs    Hypoalbuminemia - albumin 1.6 -nutrition consulted 7/8   Constipation -resolved with SMOG enema, follow  Hypokalemia -replace k  L. Sided neck swelling/mass  - [place] to further eval dc'ed as pt now full comfort care  Discussed palliative care with wife as she stated that above all she is interested in pt being comfortable- she agreed to palliative consult for GOC  7/13, and today had meeting and decided on full comfort as above.  DVT prophylaxis: SCDs Code Status: DO NOT RESUSCITATE Family Communication: Patient and wife, duaghter at bedside Disposition Plan: Likely to SNFwhen medically stable Isolation: Contact isolation for MRSA PCR positive status as well as recent treatment for ESBL Escherichia coli pneumonia Nutritional Status: Compromised with severe protein calorie malnutrition please see above  Consultants: Cardiology Pulmonology  Procedures: None  Antibiotics: Maxipime, Cipro and vancomycin 7/7 >>> 7/8  HPI/Subjective: Patient with resp distress last pm and placed on BIPAP. Palliative care meeting this am withpt and family>> full comfort measures.  Objective: Blood pressure 128/83, pulse 81, temperature 97.9 F (36.6 C), temperature source Oral, resp. rate 21, height 5' 6.5" (1.689 m), weight 80.332 kg (177 lb 1.6 oz), SpO2 92.00%.  Intake/Output Summary (Last 24 hours) at 09/28/12 1555 Last data filed at 09/28/12 1505  Gross per 24 hour  Intake    240 ml  Output   2750 ml  Net  -2510 ml     Exam: General: No accessory muscle use, alert, answers appropriately, chronically ill appearing. Wife and multiple family members present at bedside. Lungs: clear anteriorly no wheezes or crackles, BIPAP mask on. Cardiovascular:regular rate and rhythm without murmur gallop or rub normal S1 and S2, without JVD Abdomen: Nontender, nondistended, soft, bowel sounds positive, no rebound, no ascites, no appreciable mass Extremities: No cyanosis,no edema in LE, decreasing RUE edema Neurological: Alert and oriented x 3, moves all extremities x 4 without focal neurological deficits but has diffuse generalized weakness with strength being 3/5, CN 2-12 intact  Scheduled Meds: Scheduled Meds: . albuterol  2.5 mg Nebulization Q4H  . feeding supplement  237 mL Oral BID BM  . ipratropium  0.5 mg Nebulization Q4H  . LORazepam  0.5 mg Intravenous  Q6H  . metoprolol  5 mg Intravenous Q6H  . mupirocin cream   Topical BID  . scopolamine  1 patch Transdermal Q72H  . sodium chloride  10-40 mL Intracatheter Q12H  . sodium chloride  3 mL Intravenous Q12H   Continuous Infusions: . morphine 1 mg/hr (09/28/12 1524)     Data Reviewed: Basic Metabolic Panel:  Recent Labs Lab 09/24/12 1015 09/25/12 1040 09/26/12 0630 09/27/12 0535 09/28/12 0430  NA 134* 138 142 140 140  K 3.6 3.4* 3.1* 3.6 3.9  CL 90* 95* 98 99 99  CO2 30 30 33* 29 32  GLUCOSE 219* 152* 178* 151* 172*  BUN 98* 89* 74* 70* 64*  CREATININE 2.48* 2.22* 1.84* 1.90* 1.78*  CALCIUM 9.1 9.0 8.9 8.9 8.4   Liver Function Tests:  Recent Labs Lab 09/22/12 0500  AST 23  ALT 32  ALKPHOS 87  BILITOT 0.4  PROT 5.7*  ALBUMIN 2.2*   No results found for this basename: LIPASE, AMYLASE,  in the last 168 hours No results found for this basename: AMMONIA,  in the last 168 hours CBC:  Recent Labs Lab 09/24/12 1010 09/25/12 1040 09/26/12 0630 09/27/12 0535 09/28/12 0430  WBC 17.9* 12.1* 11.0* 15.2* 20.6*  HGB 10.7* 11.2* 11.2*  11.1* 10.8*  HCT 31.7* 33.3* 33.1* 33.3* 34.0*  MCV 94.9 95.1 96.2 97.9 99.4  PLT 324 325 294 272 274   Cardiac Enzymes:  Recent Labs Lab Oct 07, 2012 1932 10/07/2012 2219 09/22/12 0419  TROPONINI <0.30 <0.30 <0.30   BNP (last 3 results)  Recent Labs  09/11/12 0500 10/07/12 1211 09/26/12 0630  PROBNP 1709.0* 806.5* 4060.0*   CBG:  Recent Labs Lab 09/27/12 0621 09/27/12 1158 09/27/12 1620 09/27/12 2108 09/28/12 0648  GLUCAP 148* 138* 141* 161* 186*    Recent Results (from the past 240 hour(s))  CULTURE, BLOOD (ROUTINE X 2)     Status: None   Collection Time    2012/10/07  1:46 PM      Result Value Range Status   Specimen Description BLOOD ARM RIGHT   Final   Special Requests     Final   Value: BOTTLES DRAWN AEROBIC AND ANAEROBIC 10CCBLUE 5CCRED   Culture  Setup Time 2012-10-07 17:32   Final   Culture NO GROWTH 5 DAYS    Final   Report Status 09/27/2012 FINAL   Final  CULTURE, BLOOD (ROUTINE X 2)     Status: None   Collection Time    2012-10-07  1:50 PM      Result Value Range Status   Specimen Description BLOOD HAND RIGHT   Final   Special Requests BOTTLES DRAWN AEROBIC ONLY 2.5CC   Final   Culture  Setup Time October 07, 2012 17:32   Final   Culture NO GROWTH 5 DAYS   Final   Report Status 09/27/2012 FINAL   Final  MRSA PCR SCREENING     Status: Abnormal   Collection Time    Oct 07, 2012  5:09 PM      Result Value Range Status   MRSA by PCR POSITIVE (*) NEGATIVE Final   Comment:            The GeneXpert MRSA Assay (FDA     approved for NASAL specimens     only), is one component of a     comprehensive MRSA colonization     surveillance program. It is not     intended to diagnose MRSA     infection nor to guide or     monitor treatment for     MRSA infections.     RESULT CALLED TO, READ BACK BY AND VERIFIED WITH:     Bridget Hartshorn RN 2112 10-07-2012 A BROWNING  URINE CULTURE     Status: None   Collection Time    10-07-12  6:20 PM      Result Value Range Status   Specimen Description URINE, CATHETERIZED   Final   Special Requests NONE   Final   Culture  Setup Time 10-07-12 19:16   Final   Colony Count NO GROWTH   Final   Culture NO GROWTH   Final   Report Status 09/22/2012 FINAL   Final     Studies:  I have reviewed all Recent x-ray studies in detail     If 7PM-7AM, please contact night-coverage [email or web address] Password [email or web address] 09/28/2012, 3:55 PM   LOS: 7 days    Raquan Iannone C,MD [phone] 09/28/2012, 3:55 PM

## 2012-09-28 NOTE — Progress Notes (Signed)
PULMONARY  / CRITICAL CARE MEDICINE  Name: [name] LEMBO Sr. MRN: [id number] DOB: [birth year]    ADMISSION DATE:  September 23, 2012 CONSULTATION DATE:  09/23/12  REFERRING MD :  Dewayne Shorter PRIMARY SERVICE:  Internal Medicine  CHIEF COMPLAINT:  SOB  BRIEF PATIENT DESCRIPTION: 79 year old male W/ PMH Chronic resp failure on chronic pred and oxygen), admitted 7/7 with SOB and acute on chronicrespiratory failure (just discharged 7/2).   SIGNIFICANT EVENTS / STUDIES:  7/7 Admit  LINES / TUBES: R CW PAC >>  CULTURES: 7/7 blood x2 >>ngtd 7/7 UC >neg   ANTIBIOTICS:  COmpleted 10 ds of Primaxin 6/30 7/7 Cefepime >> 7/7 7/7 Vancomycin >>?  7/7 Imipenem >>7/10 7/7 Cipro >>?  7/7 Diflucan >>   HISTORY OF PRESENT ILLNESS:  Mr. Oxley is a 79 year old male with a history of COPD and recent ESBL pneumonia. He was discharged on 7/2 (adm 6/12 ) and sustained a fall on day of discharge, with difficulty getting him up. His family notes that he has been SOB since discharge and has been requiring 4 L/min [place] at home. His SOB has continued to worsen and he is bringing up yellow sputum.   His SOB acutely worsened today and EMS was called. On arrival they noted SpO2 in the 80's, and was placed on CPAP by EMS, which was transitioned to BiPaP by the ED. He has received broad spectrum antibiotics while in ED.  PMH  Of NSCLC s/p RULectomy and adjuvant chemo in 2006    has a past medical history of Stricture and stenosis of esophagus (12/07/2004); GERD (gastroesophageal reflux disease); Hypoxemia; Increased prostate specific antigen (PSA) velocity (02/26/2011); DM (diabetes mellitus) (02/26/2011); Gout (02/26/2011); COPD (chronic obstructive pulmonary disease); OSA (obstructive sleep apnea) (02/26/2011); Paroxysmal atrial fibrillation; Depression; Cardiac arrest (2009); Hypertension; HTN (hypertension) (02/26/2011); Cardiac arrest (2009); Non-small cell lung cancer; Basal cell carcinoma of nose (02/26/2011); ETOH abuse  (01/29/2012); CKD (chronic kidney disease) (02/28/2012); Chronic diastolic CHF (congestive heart failure); Sick sinus syndrome; Bradycardia; Paroxysmal VT; CAD (coronary artery disease); Esophageal ulcer; Hiatal hernia; Valvular heart disease; Hemoptysis; Pneumonia; Fungal infection; and Hypoalbuminemia.   has past surgical history that includes Lung lobectomy; Knee arthroscopy; Visual merchandiser; Tonsillectomy; Hernia repair; Cardiac catheterization; Esophagogastroduodenoscopy (10/25/2011); Video bronchoscopy (N/A, 09/01/2012); and Video bronchoscopy (Bilateral, 09/11/2012).    SUBJECTIVE:  Had a good day Sunday, took good PO. Then more resp distress last pm and required biPAP again. He is on BiPAP this am, states that he is not uncomfortable w the mask on.   VITAL SIGNS: Temp:  [97.5 F (36.4 C)-97.9 F (36.6 C)] 97.9 F (36.6 C) (07/14 0654) Pulse Rate:  [77-82] 77 (07/14 0654) Resp:  [20-22] 21 (07/14 0654) BP: (134-159)/(78-86) 150/82 mmHg (07/14 0654) SpO2:  [94 %-100 %] 100 % (07/14 0844) FiO2 (%):  [35 %] 35 % (07/13 1627) Weight:  [80.332 kg (177 lb 1.6 oz)] 80.332 kg (177 lb 1.6 oz) (07/14 0654)  PHYSICAL EXAMINATION: General:  Chronically ill-appearing elderly male, in NAD Neuro:  A&Ox3, follows commands HEENT:  No scleral icterus Neck: No LN or TMG Cardiovascular:  RRR, no murmur Lungs:  Lots of upper airway noise, basilar crackles, distant BS Abdomen:  Round, soft, non-tender, active BS Musculoskeletal:  Mild edema, no cyanosis Skin:  Skin tears on left hand, left knee.   Recent Labs Lab 09/26/12 0630 09/27/12 0535 09/28/12 0430  NA 142 140 140  K 3.1* 3.6 3.9  CL 98 99 99  CO2 33* 29 32  BUN 74* 70* 64*  CREATININE 1.84* 1.90* 1.78*  GLUCOSE 178* 151* 172*    Recent Labs Lab 09/26/12 0630 09/27/12 0535 09/28/12 0430  HGB 11.2* 11.1* 10.8*  HCT 33.1* 33.3* 34.0*  WBC 11.0* 15.2* 20.6*  PLT 294 272 274    Recent Labs Lab 09/27/2012 1211 09/26/12 0630   PROBNP 806.5* 4060.0*    No results found.  ASSESSMENT / PLAN:   Acute on Chronic Respiratory Failure multifactoral in nature, likely causes are progressive deconditioning, COPD (not currently in acute exacerbation: O2 and pred dep), heart failure,  Recent pneumonia, acute on chronic renal failure. Appears to have FTT since discharge - his wife has been struggling to take care of him.   H/O OSA (nocturnal CPAP)  Progressive resp failure 7/14. He has been seen by Dr Ladona Ridgel w Palliative Care Medicine. His goals and family's goals are to transition to comfort, avoid life-prolonging measures.   P: - agree with starting morphine and transitioning to comfort based approach to his care - would transition off BiPAP unless this is easing his WOB, enhancing his comfort - his wife does not feel comfortable taking him home with hospice care (if he were to stabilize); suspect he will expire in the hospital, but ? consider [facility]    Levy Pupa, MD, PhD 09/28/2012, 10:37 AM [facility] [phone] or if no answer [phone]

## 2012-09-28 NOTE — Consult Note (Signed)
Patient ZO:XWRUEA D Held Sr.      DOB: Apr 19, 1932      VWU:[id number]  Summary of Goals of Care; full note to follow:  Present were patient's sons - Malon, Branton there spouses; spouse Inetta Fermo; and Mat Carne and Ruthie   Dr. Graciela Husbands was kind enough to share his perspectives initially on no further curative options for the patient's cardiac condition.  Family recognizes they have fought the good fight and done all they can do to bring [place] back to a baseline function which has quality and dignity.  His spouse was struggling with finally making the decision to forgo curative treatment and seek full comfort care.  We were able to talk through this and they were all in agreement that it is time to focus on comfort. Under the current circumstances comfort will  Require continuous opiates to address the patients respiratory failure.  For now he is on his CPAP without which his saturations were in the 50% range.  The patient is awake and alert but does have increased work of breathing.  He comforted his wife by saying" everything will be ok".  He seeming understands his wife to be talking about his death and he seems at peace with this.    We discussed the following recommendations:  1.  DNR/ DNI affirmed 2.  Respiratory Failure/Dyspnea:  Continue CPAP as long as he is comfortable.  May consider removal of CPAP later when the patient is more sedate and comfortable.  3.  Discontinue non essential and oral meds  4.  Afib with RVR on three oral agents which he will not be able to take: dc and start scheduled metoprolol 5 mg q 6 hr with hold parameters.  5.  Terminal secretions: atropine and scopolamine  Suspect prognosis is  Hours , expecting a hospital death.  Total time: 840 am - 940 am  Discussed with Dr. Donna Bernard, [name], Osie Bond L. Ladona Ridgel, MD MBA The Palliative Medicine Team at [facility] Phone: [phone] Pager: [phone]

## 2012-09-28 NOTE — Progress Notes (Addendum)
Palliative Medicine Team SW Pt discussed in PMT Rounds. Extensive psychosocial support provided to family who are grieving in anticipation of pursuing full comfort measures today. Spoke at length with pt, wife, and sons as well as provided support to the several family members present the waiting room. Wife able to express her love for pt and reminisce about the couple's 58 years of marriage, 3 sons (1 deceased), several grand and great grands. Family have already lost a son and express their relief that pt will get to see him soon. Pt calm, in and out of alertness, comforted by family's presence. Family expressed their relief at knowing that pt is "at peace with all of this" and their concern for wife's well being after his passing. We discussed family's strengths and self-reliance. Encouraged their efforts to practice self-care. Family expressed their gratitude for pt's "rally" yesterday. Family also have been making final arrangements  Reviewed with RN and Nurse Tech; comfort cart has been ordered. Provided family with a Gone From My Sight Booklet, info on bereavement support and my contact info. Will continue to follow as needed, family very appreciative of support.   5:31 PM  Continued to follow for family and staff support throughout the day. Family reported difficulty coping with their perceived extended process of transitioning to full comfort. Continued to reassure family of the validity of their choices for pt. Family appreciative of care and support of staff.   Kennieth Francois, LCSWA PMT Phone [phone] Pager [phone]

## 2012-09-28 NOTE — Consult Note (Signed)
Patient [name] D Bogan Sr.      DOB: Dec 31, 1932      WJX:[id number]     Consult Note from the Palliative Medicine Team at [facility]    Consult Requested by: Dr. Donna Bernard     PCP: [name], MD Reason for Consultation:GOC     Phone Number:[phone] Symptom mangement Assessment of patients Current state: 79 yr old white male with advance COPD, history SCC lung,OSA, ESBL pneumonia acute on chronic diastolic heart failure.  Patient has a lengthy hospital stay discharging to home on 09/16/12 and readmitted 10/12/2012 with worsening shortness of breath. Patient was admitted and treated for pneumonia but developed respiratory failure.  We met with family who elected to pursue full comfort care.  Patient's symptoms warranted initiation of continuous opiates with discontinuation of cpap.     Goals of Care: 1.  Code Status: DNR/DNI   2. Scope of Treatment: Full comfort care.  Dc all labs, decrease frequency of vital signs, aggressive symptom management with continuous opiates and scheduled ativan.   4. Disposition: expecting a hospitial death   3. Symptom Management:   1. Anxiety/Agitation: scheduled ativan 2. Pain:Morphine drip titration start at 1mg /hr 3. Terminal Secretions: atropine and scopalomine  4. Psychosocial:  Patient is married , spouse is very devoted.  Two sons also are very devoted. Patient is described as good father and good spouse  5. Spiritual: family is supported by their home church.        Patient Documents Completed or Given: Document Given Completed  Advanced Directives Pkt    MOST    DNR    Gone from My Sight    Hard Choices      Brief HPI: 79 yr old with multiple medical illnesses readmitted with respiratory failure   ONG:EXBMWU to obtain full review.  Short of breath.    PMH:  Past Medical History  Diagnosis Date  . Stricture and stenosis of esophagus 12/07/2004    EGD done on 12/07/2004  . GERD (gastroesophageal reflux disease)   .  Hypoxemia     With chronic respiratory failure  . Increased prostate specific antigen (PSA) velocity 02/26/2011  . DM (diabetes mellitus) 02/26/2011  . Gout 02/26/2011  . COPD (chronic obstructive pulmonary disease)     a. oxygen at home, 4L. b. On prednisone.  . OSA (obstructive sleep apnea) 02/26/2011    CPAP  . Paroxysmal atrial fibrillation   . Depression   . Cardiac arrest 2009    while in hospital   . Hypertension   . HTN (hypertension) 02/26/2011  . Cardiac arrest 2009    while in hospital  . Non-small cell lung cancer     a. s/p RUL lobectomy, adjuvant chemo 2006. b. New lung nodule seen 08/2012, OP f/u recommended.  . Basal cell carcinoma of nose 02/26/2011  . ETOH abuse 01/29/2012    started drinking again - hx cardiac arrest in 2009 due to dt's with organ failure  . CKD (chronic kidney disease) 02/28/2012  . Chronic diastolic CHF (congestive heart failure)     a. EF 65-70% 08/2012. b. Complex picture with ascites/anasarca/low albumin/CKD.  . Sick sinus syndrome     a. s/p [employer] pacemaker 2010.  . Bradycardia     a. Admitted 2010 with severe bradycardia, runs of paroxysmal VT.   Marland Kitchen Paroxysmal VT     a. In 2010 in setting of marked bradycardia.  Marland Kitchen CAD (coronary artery disease)     a. Nonobstructive CAD by cath  2006.  . Esophageal ulcer     a. By EGD 10/2011.  Marland Kitchen Hiatal hernia     a. By EGD 10/2011.  . Valvular heart disease     a. Mild MR/mild AI/mild AS by echo 05/2011.  Marland Kitchen Hemoptysis     a. 08/2012:  Bronchoscopy 09/01/12 did not determine a source - bronchial washings did not show malignant cells on path. ENT did not find any pathology in the upper most respiratory/gi tract by fiberoptic laryngoscopy. A second bronchoscopy was done on 09/11/12 - this time a lesion along the RML suture line from previous lobectomy was found, but without evidence for malignancy on biopsy.   . Pneumonia     a. Hosp 08/2012 with + ESBL E coli, candida albicans on BAL tx with levaquin,  primaxin, diflucan - complex hospitalization with CKD, anasarca, CHF, afib, hemoptysis.  . Fungal infection     a. Rash 08/2012 - felt to be disseminated fungal infection, also treated with Valtrex for possible buttocks shingles.  . Hypoalbuminemia     a. 08/2012.     PSH: Past Surgical History  Procedure Laterality Date  . Lung lobectomy      x2 lobes on RT lung  . Knee arthroscopy      bilateral  . Pace maker    . Tonsillectomy    . Hernia repair      x2, esophageal  . Cardiac catheterization    . Esophagogastroduodenoscopy  10/25/2011    Procedure: ESOPHAGOGASTRODUODENOSCOPY (EGD);  Surgeon: Hart Carwin, MD;  Location: [facility] ENDOSCOPY;  Service: Endoscopy;  Laterality: N/A;  . Video bronchoscopy N/A 09/01/2012    Procedure: VIDEO BRONCHOSCOPY WITHOUT FLUORO;  Surgeon: Storm Frisk, MD;  Location: [facility] ENDOSCOPY;  Service: Cardiopulmonary;  Laterality: N/A;  . Video bronchoscopy Bilateral 09/11/2012    Procedure: VIDEO BRONCHOSCOPY WITHOUT FLUORO;  Surgeon: Leslye Peer, MD;  Location: [facility] ENDOSCOPY;  Service: Cardiopulmonary;  Laterality: Bilateral;   I have reviewed the FH and SH and  If appropriate update it with new information. No Known Allergies Scheduled Meds: . albuterol  2.5 mg Nebulization Q4H  . feeding supplement  237 mL Oral BID BM  . guaiFENesin  1,200 mg Oral BID  . ipratropium  0.5 mg Nebulization Q4H  . LORazepam  0.5 mg Intravenous Q6H  . metoprolol  5 mg Intravenous Q6H  . mineral oil  1 enema Rectal Daily  . multivitamin with minerals  1 tablet Oral Daily  . mupirocin cream   Topical BID  . potassium chloride SA  20 mEq Oral Daily  . scopolamine  1 patch Transdermal Q72H  . senna  1 tablet Oral BID  . sodium chloride  10-40 mL Intracatheter Q12H  . sodium chloride  3 mL Intravenous Q12H  . sodium chloride  3 mL Intravenous Q12H   Continuous Infusions: . morphine     PRN Meds:.acetaminophen, atropine, hydroxypropyl methylcellulose, LORazepam,  morphine, nitroGLYCERIN, ondansetron (ZOFRAN) IV, ondansetron, sodium chloride, sodium chloride    BP 150/82  Pulse 77  Temp(Src) 97.9 F (36.6 C) (Oral)  Resp 21  Ht 5' 6.5" (1.689 m)  Wt 80.332 kg (177 lb 1.6 oz)  BMI 28.16 kg/m2  SpO2 100%   PPS: 10%   Intake/Output Summary (Last 24 hours) at 09/28/12 1100 Last data filed at 09/28/12 0951  Gross per 24 hour  Intake    480 ml  Output   3850 ml  Net  -3370 ml     Physical  Exam:  General: Significant respiratory distress on CPAP at present cc accessory muscles and audible rhonchi HEENT:  [place], PERRL EOMI, speech slightly slurred, mm dry Chest:   Coarse bilateral rhonchi, no wheezing CVS: tachycardic, S1, S2 Abdomen:obese, soft, mildly distended Ext: multiple skin lesions, ecchymosis, edema Neuro:intermittently somnolent, pleasantly confused.  Labs: CBC    Component Value Date/Time   WBC 20.6* 09/28/2012 0430   WBC 12.4* 02/18/2012 0948   WBC 15.2* 10/11/2009 1417   RBC 3.42* 09/28/2012 0430   RBC 3.97* 02/18/2012 0948   HGB 10.8* 09/28/2012 0430   HGB 12.1* 02/18/2012 0948   HGB 12.5* 10/11/2009 1417   HCT 34.0* 09/28/2012 0430   HCT 36.1* 02/18/2012 0948   HCT 35.8* 10/11/2009 1417   PLT 274 09/28/2012 0430   PLT 364 02/18/2012 0948   PLT 393 10/11/2009 1417   MCV 99.4 09/28/2012 0430   MCV 90.9 02/18/2012 0948   MCV 91 10/11/2009 1417   MCH 31.6 09/28/2012 0430   MCH 30.5 02/18/2012 0948   MCH 31.8 10/11/2009 1417   MCHC 31.8 09/28/2012 0430   MCHC 33.5 02/18/2012 0948   MCHC 35.0 10/11/2009 1417   RDW 20.6* 09/28/2012 0430   RDW 16.3* 02/18/2012 0948   RDW 12.7 10/11/2009 1417   LYMPHSABS 2.0 09/17/2012 1211   LYMPHSABS 1.8 02/18/2012 0948   LYMPHSABS 3.0 10/11/2009 1417   MONOABS 1.3* 09/22/2012 1211   MONOABS 1.2* 02/18/2012 0948   EOSABS 0.0 09/23/2012 1211   EOSABS 0.1 02/18/2012 0948   EOSABS 0.1 10/11/2009 1417   BASOSABS 0.0 09/26/2012 1211   BASOSABS 0.1 02/18/2012 0948   BASOSABS 0.1 10/11/2009 1417      CMP      Component Value Date/Time   NA 140 09/28/2012 0430   NA 136 01/20/2012 1413   NA 138 10/26/2010 1508   K 3.9 09/28/2012 0430   K 3.8 01/20/2012 1413   K 4.4 10/26/2010 1508   CL 99 09/28/2012 0430   CL 102 01/20/2012 1413   CL 95* 10/26/2010 1508   CO2 32 09/28/2012 0430   CO2 25 01/20/2012 1413   CO2 29 10/26/2010 1508   GLUCOSE 172* 09/28/2012 0430   GLUCOSE 131* 01/20/2012 1413   GLUCOSE 119* 10/26/2010 1508   BUN 64* 09/28/2012 0430   BUN 31.0* 01/20/2012 1413   BUN 23* 10/26/2010 1508   CREATININE 1.78* 09/28/2012 0430   CREATININE 1.9* 01/20/2012 1413   CREATININE 1.3* 10/26/2010 1508   CALCIUM 8.4 09/28/2012 0430   CALCIUM 8.9 01/20/2012 1413   CALCIUM 8.7 10/26/2010 1508   PROT 5.7* 09/22/2012 0500   PROT 6.3* 01/20/2012 1413   PROT 6.5 10/26/2010 1508   ALBUMIN 2.2* 09/22/2012 0500   ALBUMIN 3.2* 01/20/2012 1413   AST 23 09/22/2012 0500   AST 22 01/20/2012 1413   AST 25 10/26/2010 1508   ALT 32 09/22/2012 0500   ALT 23 01/20/2012 1413   ALKPHOS 87 09/22/2012 0500   ALKPHOS 96 01/20/2012 1413   ALKPHOS 97* 10/26/2010 1508   BILITOT 0.4 09/22/2012 0500   BILITOT 0.47 01/20/2012 1413   BILITOT 0.60 10/26/2010 1508   GFRNONAA 35* 09/28/2012 0430   GFRAA 41* 09/28/2012 0430    Chest Xray Reviewed/Impressions:  Some increase in the patient's small right pleural effusion  with no notable change on the left.  2. Cardiomegaly and vascular congestion.  3. Emphysema      Time In Time Out Total Time Spent with Patient Total Overall Time  8:40 AM   9:40 AM   60 minutes   60 minutes     Greater than 50%  of this time was spent counseling and coordinating care related to the above assessment and plan.  Discussed with Dr. Graciela Husbands, Dr. [name] and Dr. Campbell Stall L. Ladona Ridgel, MD MBA The Palliative Medicine Team at [facility] Phone: [phone] Pager: [phone]

## 2012-09-28 NOTE — Progress Notes (Signed)
New orders given to provide comfort care for patient. Morphine drip, IV ativan, and IV metoprolol administered per Dr. Bruna Potter' order. Bolus of morphine given at 1358 for comfort measures while patient continues on CPAP. Emotional support given to family while family present in room. Will continue to monitor to end of shift.

## 2012-09-28 NOTE — Progress Notes (Signed)
Patient WU:JWJXBJ D Cerniglia Sr.      DOB: April 19, 1932      [id number]   Patient started on 2mg /hr of morphine and received 1.5 mg of Ativan.  Patient remains somewhat interactive.  He stated he felt good and want to be off the CPAP.  Breathing treatment given and CPAP removed patient with minimal accessory muscle use. States he feels good.  Deep wet cough.  Nurses educated on using boluses and ativan.  Drip may be titrated if needed.   Total time 30 min.  Breyden Jeudy L. Ladona Ridgel, MD MBA The Palliative Medicine Team at [facility] Phone: [phone] Pager: [phone]

## 2012-09-28 NOTE — Progress Notes (Signed)
SLP Cancellation Note  Patient Details Name: [name] COUSINEAU Sr. MRN: [id number] DOB: Dec 22, 1932   Cancelled treatment:       Reason Eval/Treat Not Completed: Medical issues which prohibited therapy;Other (comment) (pt underwent palliative care meeting today, note w/e events)    [name] Burnet, MS [facility] SLP [phone]

## 2012-09-28 NOTE — Progress Notes (Signed)
Patient Name: [name] CHALFIN Sr.      SUBJECTIVE:  resamitted with resp failure with co2 depdt COPD , possible pneumonia general weakness and chronic rate controlled afib and ? Aspiration  Possible component of HFpEF but not clearly so  REcurrent hemoptysis following initiation of anticoagulation for RUE DVT>> anticoagulation d/c  With risk!! Discussed with family  Not walking these lst few days;  Palliative care consult in progress    Wife says he wants him comfortable  He is DNR and that is appropriated    Past Medical History  Diagnosis Date  . Stricture and stenosis of esophagus 12/07/2004    EGD done on 12/07/2004  . GERD (gastroesophageal reflux disease)   . Hypoxemia     With chronic respiratory failure  . Increased prostate specific antigen (PSA) velocity 02/26/2011  . DM (diabetes mellitus) 02/26/2011  . Gout 02/26/2011  . COPD (chronic obstructive pulmonary disease)     a. oxygen at home, 4L. b. On prednisone.  . OSA (obstructive sleep apnea) 02/26/2011    CPAP  . Paroxysmal atrial fibrillation   . Depression   . Cardiac arrest 2009    while in hospital   . Hypertension   . HTN (hypertension) 02/26/2011  . Cardiac arrest 2009    while in hospital  . Non-small cell lung cancer     a. s/p RUL lobectomy, adjuvant chemo 2006. b. New lung nodule seen 08/2012, OP f/u recommended.  . Basal cell carcinoma of nose 02/26/2011  . ETOH abuse 01/29/2012    started drinking again - hx cardiac arrest in 2009 due to dt's with organ failure  . CKD (chronic kidney disease) 02/28/2012  . Chronic diastolic CHF (congestive heart failure)     a. EF 65-70% 08/2012. b. Complex picture with ascites/anasarca/low albumin/CKD.  . Sick sinus syndrome     a. s/p Medtronic pacemaker 2010.  . Bradycardia     a. Admitted 2010 with severe bradycardia, runs of paroxysmal VT.   Marland Kitchen Paroxysmal VT     a. In 2010 in setting of marked bradycardia.  Marland Kitchen CAD (coronary artery disease)     a.  Nonobstructive CAD by cath 2006.  Marland Kitchen Esophageal ulcer     a. By EGD 10/2011.  Marland Kitchen Hiatal hernia     a. By EGD 10/2011.  . Valvular heart disease     a. Mild MR/mild AI/mild AS by echo 05/2011.  Marland Kitchen Hemoptysis     a. 08/2012:  Bronchoscopy 09/01/12 did not determine a source - bronchial washings did not show malignant cells on path. ENT did not find any pathology in the upper most respiratory/gi tract by fiberoptic laryngoscopy. A second bronchoscopy was done on 09/11/12 - this time a lesion along the RML suture line from previous lobectomy was found, but without evidence for malignancy on biopsy.   . Pneumonia     a. Hosp 08/2012 with + ESBL E coli, candida albicans on BAL tx with levaquin, primaxin, diflucan - complex hospitalization with CKD, anasarca, CHF, afib, hemoptysis.  . Fungal infection     a. Rash 08/2012 - felt to be disseminated fungal infection, also treated with Valtrex for possible buttocks shingles.  . Hypoalbuminemia     a. 08/2012.    Scheduled Meds:  Scheduled Meds: . albuterol  2.5 mg Nebulization Q4H  . amiodarone  200 mg Oral BID  . aspirin EC  81 mg Oral Daily  . atorvastatin  40 mg Oral QHS  . citalopram  10 mg Oral Daily  . diltiazem  180 mg Oral BID  . docusate sodium  100 mg Oral BID  . ezetimibe  10 mg Oral Daily  . feeding supplement  237 mL Oral BID BM  . fluconazole  100 mg Oral Daily  . guaiFENesin  1,200 mg Oral BID  . insulin aspart  0-9 Units Subcutaneous TID WC  . insulin glargine  8 Units Subcutaneous QHS  . ipratropium  0.5 mg Nebulization Q4H  . magnesium oxide  400 mg Oral Daily  . metoprolol succinate  25 mg Oral BID WC  . mineral oil  1 enema Rectal Daily  . multivitamin with minerals  1 tablet Oral Daily  . mupirocin cream   Topical BID  . omega-3 acid ethyl esters  2 g Oral Daily  . pantoprazole  40 mg Oral Daily  . potassium chloride SA  20 mEq Oral Daily  . senna  1 tablet Oral BID  . sodium chloride  10-40 mL Intracatheter Q12H  . sodium  chloride  3 mL Intravenous Q12H  . sodium chloride  3 mL Intravenous Q12H  . vitamin B-12  50 mcg Oral Daily  . vitamin E  400 Units Oral BID   Continuous Infusions:    PHYSICAL EXAM Filed Vitals:   09/27/12 2318 09/27/12 2323 09/28/12 0404 09/28/12 0654  BP:    150/82  Pulse:  82  77  Temp:    97.9 F (36.6 C)  TempSrc:    Oral  Resp:  22  21  Height:      Weight:    177 lb 1.6 oz (80.332 kg)  SpO2: 95% 96% 95% 94%   BP 150/82  Pulse 77  Temp(Src) 97.9 F (36.6 C) (Oral)  Resp 21  Ht 5' 6.5" (1.689 m)  Wt 177 lb 1.6 oz (80.332 kg)  BMI 28.16 kg/m2  SpO2 100%  Well developed and nourished on BiPAP HENT normal   Coarse  Left neck mass   Porta cath access site Regular rate and rhythm, no murmurs or gallops Abd-soft with active BS No Clubbing cyanosis major and prog swelling of RU Extremtiy Skin-wth bruisinry A & Oriented  Grossly normal sensory and motor function   TELEMETRY: Reviewed telemetry pt in afib cvr:    Intake/Output Summary (Last 24 hours) at 09/28/12 0827 Last data filed at 09/28/12 0654  Gross per 24 hour  Intake    360 ml  Output   3550 ml  Net  -3190 ml    LABS: Basic Metabolic Panel:  Recent Labs Lab 09/22/12 0500 09/23/12 0100 09/24/12 1015 09/25/12 1040 09/26/12 0630 09/27/12 0535 09/28/12 0430  NA 131* 129* 134* 138 142 140 140  K 3.5 3.3* 3.6 3.4* 3.1* 3.6 3.9  CL 88* 88* 90* 95* 98 99 99  CO2 28 29 30 30  33* 29 32  GLUCOSE 259* 333* 219* 152* 178* 151* 172*  BUN 95* 94* 98* 89* 74* 70* 64*  CREATININE 3.07* 3.12* 2.48* 2.22* 1.84* 1.90* 1.78*  CALCIUM 9.0 8.7 9.1 9.0 8.9 8.9 8.4   Cardiac Enzymes: No results found for this basename: CKTOTAL, CKMB, CKMBINDEX, TROPONINI,  in the last 72 hours CBC:  Recent Labs Lab Oct 15, 2012 1211 09/22/12 0500 09/23/12 0100 09/24/12 1010 09/25/12 1040 09/26/12 0630 09/27/12 0535 09/28/12 0430  WBC 18.9* 14.9* 18.4* 17.9* 12.1* 11.0* 15.2* 20.6*  NEUTROABS 15.6*  --   --   --    --   --   --   --  HGB 11.5* 10.1* 10.0* 10.7* 11.2* 11.2* 11.1* 10.8*  HCT 34.1* 30.0* 29.9* 31.7* 33.3* 33.1* 33.3* 34.0*  MCV 94.7 93.5 95.5 94.9 95.1 96.2 97.9 99.4  PLT 242 241 270 324 325 294 272 274   PROTIME: No results found for this basename: LABPROT, INR,  in the last 72 hours Liver Function Tests: No results found for this basename: AST, ALT, ALKPHOS, BILITOT, PROT, ALBUMIN,  in the last 72 hours No results found for this basename: LIPASE, AMYLASE,  in the last 72 hours BNP: BNP (last 3 results)  Recent Labs  09/11/12 0500 10-18-12 1211 09/26/12 0630  PROBNP 1709.0* 806.5* 4060.0*     ASSESSMENT AND PLAN:  Active Problems:   CARCINOMA, LUNG, SQUAMOUS CELL   CORONARY ARTERY DISEASE-nonobstructive disease 2006 catheterization   Dysphagia   OSA (obstructive sleep apnea)   HTN (hypertension)   Diabetes type 2, uncontrolled   Atrial fibrillation   CKD (chronic kidney disease), stage III   Acute on chronic diastolic congestive heart failure   Acute-on-chronic respiratory failure   Obstructive chronic bronchitis with exacerbation   Dehydration with hyponatremia   Hypoalbuminemia   Hypoxia  Continue gentle diuresis, followin Cr RUE DVT noted  Heparin started  And stopped becaseu of recurring hemoptysis Need to elevate RUE He is gradually but steadily worsening There is a new mass in the Left neck   ?  Bleed given abruptness will discuss with IM re imaging  U/s pwending Spoke with gathered family prior tomeeting with palliative care; told them not optomistic that he will survive this hospitalizatin and that plans to clarify goals are appropriate Signed, Sherryl Manges MD  09/28/2012

## 2012-09-28 NOTE — Progress Notes (Signed)
Earlier this afternoon, patient using accessory muscles more to breath while on CPAP. CPAP removed per Dr. Ladona Ridgel while nurse and respiratory therapist in room. RT removed CPAP and breathing treatment given and afterwards non-breather mask applied. During this time, orders given per the doctor to titrate morphine to 2mg , also to give PRN Ativan to help patient relax. Patient was able to use less of his accessory muscles to breath and appeared to be more comfortable. Later this evening around 1730, family notified nurse that patient was coughing more and was having difficulty breathing and had some agitation. Bolus of morphine given x3 in intervals of 15-30 per Dr. Bruna Potter order within a period Therefore Morphine increased to 3mg .Patient also received scheduled Ativan and Metoprolol. Patient begin to be more calm and breathing is improved with less usage of accessory muscles. At this time patient is not able to respond and is in a resting state. Notified Dr. Ladona Ridgel and oncoming nurse of the above events. Will continue to monitor to end of shift.

## 2012-09-29 ENCOUNTER — Encounter: Payer: Medicare Other | Admitting: Cardiology

## 2012-09-29 LAB — FUNGUS CULTURE W SMEAR

## 2012-10-01 ENCOUNTER — Inpatient Hospital Stay: Payer: Medicare Other | Admitting: Adult Health

## 2012-10-03 NOTE — ED Provider Notes (Addendum)
I saw and evaluated the patient, reviewed the resident's note and I agree with the findings and plan.   .Face to face Exam:  General:  Awake HEENT:  Atraumatic Resp:  Respiratory distress with wheezes Abd:  Nondistended Neuro:No focal weakness   Nelia Shi, MD 10/03/12 1502  Nelia Shi, MD 10/14/12 1328

## 2012-10-14 LAB — AFB CULTURE WITH SMEAR (NOT AT ARMC): Acid Fast Smear: NONE SEEN

## 2012-10-16 ENCOUNTER — Ambulatory Visit: Payer: Medicare Other | Admitting: Emergency Medicine

## 2012-10-16 NOTE — Clinical Social Work Psychosocial (Addendum)
    Clinical Social Work Department BRIEF PSYCHOSOCIAL ASSESSMENT 10/02/2012  Patient:  [name] Stevenson, [name] Stevenson     Account Number:  [id number]     Admit date:  Oct 10, 2012  Clinical Social Worker:  Tiburcio Pea  Date/Time:  09/25/2012 10:00 AM  Referred by:  Physician  Date Referred:  09/25/2012 Referred for  SNF Placement   Other Referral:   Interview type:  Family Other interview type:   Patient's wife.    PSYCHOSOCIAL DATA Living Status:  WIFE Admitted from facility:   Level of care:   Primary support name:  Rena Sweeden  [phone] Primary support relationship to patient:  SPOUSE Degree of support available:   Extremely strong support    CURRENT CONCERNS Current Concerns  Post-Acute Placement   Other Concerns:    SOCIAL WORK ASSESSMENT / PLAN CSW was approached by patient's wife today to talk about need for short term SNF.  Patient was recently in the hospital and was d/c'd home with his wife and Home Health. AT that time- this family was strongly encouraged to consider SNF placement but wife adamantly refused. She now states that she realizes now that they cannot manage at home.  She expresses extreme sadness and grief and verbalizes thougths of "failure" as she wanted patient to recover at home.  CSW provided support, encouragement and also discussed SNF placement process.  Patient and wife request placement at [facility]; CSW contacted admissions at facility and they will review referral.  FL2 will be placed on pateint's chart for MD's signature.   Assessment/plan status:  Psychosocial Support/Ongoing Assessment of Needs Other assessment/ plan:   Information/referral to community resources:   SNF list provided to patient and wife    PATIENT'S/FAMILY'S RESPONSE TO PLAN OF CARE: Patient is alert and very pleasant.  He was hoping to return home but acknowldeges that he cannot manage at home at this time.  Patient's wife is grief stricken and is struggling  with idea of SNF placement even thought she knows she cannot manage his care at home. She remains at his side throughout the hospital stay despite fact that they have a large family.  CSW verbalized concerns that she was getting too exhausted and not taking care of herself; she acknowledged this.  CSW will assist with SNF placement- hopefully at [facility].  If they cannot get there, they request placement in the [place] area.

## 2012-10-16 NOTE — Progress Notes (Signed)
Pt. Currently in resting state. Wife and many family members at bedside. Morphine boluses given to assist with comfort measures. Emotional support provided to family. Pt. O2 sats 93% on non-rebreather, HR 80-90s. Pt. Converted to NSR around 0040. On call made aware. No new orders given. RN will continue to monitor pt. Deuce Paternoster, Cheryll Dessert

## 2012-10-16 NOTE — Clinical Social Work Placement (Signed)
     Clinical Social Work Department CLINICAL SOCIAL WORK PLACEMENT NOTE 09/26/2012  Patient:  [name] Stevenson, [name] Stevenson  Account Number:  [id number] Admit date:  09-26-12  Clinical Social Worker:  Lupita Leash Gerson Fauth, LCSWA  Date/time:  09/25/2012 10:45 AM  Clinical Social Work is seeking post-discharge placement for this patient at the following level of care:   SKILLED NURSING   (*CSW will update this form in Epic as items are completed)   09/25/2012  Patient/family provided with [facility] Department of Clinical Social Works list of facilities offering this level of care within the geographic area requested by the patient (or if unable, by the patients family).  09/25/2012  Patient/family informed of their freedom to choose among providers that offer the needed level of care, that participate in Medicare, Medicaid or managed care program needed by the patient, have an available bed and are willing to accept the patient.  09/25/2012  Patient/family informed of [facility] ownership interest in [facility], as well as of the fact that they are under no obligation to receive care at this facility.  PASARR submitted to EDS on 09/25/2012 PASARR number received from EDS on 09/25/2012  FL2 transmitted to all facilities in geographic area requested by pt/family on   FL2 transmitted to all facilities within larger geographic area on   Patient informed that his/her managed care company has contracts with or will negotiate with  certain facilities, including the following:   [facility]- Medicare Complete     Patient/family informed of bed offers received:   Patient chooses bed at  Physician recommends and patient chooses bed at    Patient to be transferred to  on   Patient to be transferred to facility by   The following physician request were entered in Epic:   Additional Comments: 10/01/2012  Patient expired 09/27/2012- 5:158 am.   His wife and family was at bedside. CSW called patient's home  today to offer condolences to family and spoke with patient's son [name].  No further CSW needs identified.  Lorri Frederick. Meaghan Whistler, LCWA [phone]

## 2012-10-16 NOTE — Progress Notes (Signed)
Family stated not to place pt. On cpap. Pt. Is comfort care.

## 2012-10-16 NOTE — Progress Notes (Signed)
CSW met with patient's wife and family this afternoon to provide support.  Patient is now on a morphine drink and ventimask.  His condition is poor and comfort care has been initiated.  Wife and family were appreciative of CSW's visit.  Lorri Frederick. [place]  [phone]

## 2012-10-16 NOTE — Discharge Summary (Signed)
Death Summary  [name] Newcomer Sr. ZOX:[id number] DOB: Dec 07, 1932 DOA: 22-Sep-2012  PCP: [name], MD  Admit date: September 22, 2012 Date of Death: September 30, 2012  Final Diagnoses:  Active Problems:   CARCINOMA, LUNG, SQUAMOUS CELL   CORONARY ARTERY DISEASE-nonobstructive disease 2006 catheterization   Dysphagia   OSA (obstructive sleep apnea)   HTN (hypertension)   Diabetes type 2, uncontrolled   Atrial fibrillation   CKD (chronic kidney disease), stage III   Acute on chronic diastolic congestive heart failure   Acute-on-chronic respiratory failure   Obstructive chronic bronchitis with exacerbation   Dehydration with hyponatremia   Hypoalbuminemia   Hypoxia      History of present illness:  [name] BOLEY Sr. is a 79 y.o. male recently discharged after a prolonged hospitalization, with a very complicated Past Medical History of severe COPD, recent ESBL pneumonia, hemoptysis, chronic diastolic heart failure, atrial fibrillation-currently not on anticoagulation who presents today with the above noted complaint.  Patient was discharged from this hospital on 09/16/12-upon going home, he sustained a fall on the day of discharge, [name] had difficulty lifting him up back on bed. Family has noticed that he still is very short of breath and is still requiring oxygen to maintain his O2 saturation. Family's also noticed that he was severely weak and had trouble even getting up. Over the past few days, his shortness of breath has worsened, he has had no recurrence of his hemoptysis, but is bringing up yellowish sputum. His shortness of breath particularly worsened today, as a result EMS was called, and they found him to have O2 saturation in the 80s on his usual home O2, he was placed on CPAP and brought to the emergency room, where Bipap was applied. He was found to have worsening of his usual leukocytosis, chest x-ray showed persistent left lower lobe pneumonia, he appears very weak, I was subsequently asked to  admit this patient for further evaluation and treatment.  During my evaluation, patient was on BiPAP, he was awake but very lethargic. Family members including his spouse and son were at bedside. After long discussion with family, it was decided that we not escalate care beyond BiPAP, as as result the patient will be made a DO NOT RESUSCITATE.   Hospital Course:  Acute-on-chronic respiratory failure with Hypoxia due to:  A) Obstructive chronic bronchitis with exacerbation  B) Acute on chronic diastolic congestive heart failure  -clinically did not appear to have focal pneumonia nor CHF but initially abx were empirically started but on follow up eval and monitoring PULM agreed there was no evidence of PNA and recommended to d/c the abx. he completed 10days of primaxin 6/30 for PNA -it was suspected he had acute flare of DHF which responded to Lasix since now has successfully weaned from BiPAP. It then was noted pt had minimal UOP despite high dose Lasix- he actually appears dry now- so Lasix was dc'ed on 7/8 per step down rounding team after his cr began to worsen and he was gently hydrated. subsequently the lasix was resumed as he again developed worsening SOB and his cr had improved   -suspect leukocytosis from steroids - no fever, follow and recheck  -cont Diflucan since recent broad spectrum anbx's and high risk for candida  -pt with hemoptysis and heparin and coumadin dc'ed per pulm 7/11  -on po lasix resumed 7/12, cr continuing to improve  -PT overall began declining again and on updating wife on his clinical status, palliative care was discussed as well  and she stated that above all she is interested in pt being comfortable- she agreed to palliative consult for GOC 7/13, and on 7/14 had meeting and decided on full comfort measures.  -Palliative care followed up  started on morphine &ativan prn, BIPAP dc'ed, and meds non essential to his comfort dc'ed  - on follow up on 7/15 am pt had agonal  breathing and HR down to 60. RN witnessed pt take last breath and following No further respirations noted.Further eval revealed no pulses and he was pronounced dead, family was at bedside.  CKD (chronic kidney disease), stage III  -BUN/Scr worsened with Lasix so it was dc'ed and pt gently hydrated on 7/8  -Cr stable, pt now full comfort care  Right upper extremity DVT  -doppler of legs neg for DVT  -Coumadin and heparin d/ced 7/11 as above due to hemoptysis, discussed with Dr Vassie Loll who stated he will plan to keep him off anticoagulation for 1week and then follow and reassess for further treatment, but pt is now comfort measures.  Dysphagia/Deconditioning  -wife states poor intake pre admit - ST for swallow eval 7/9  -Dysphagia 1 diet but minimal po intake  -Pt is now full comfort care/hospice, with poor prognosis and expired as above.  HTN (hypertension)  -comfort care, changed to iv metoprolol  Diabetes type 2, uncontrolled  -was worsened by steroid dose, dc'ed per pulm 7/9  -comfort care  Atrial fibrillation  -rate controlled with meds  -as above-comfort care  Dehydration with hyponatremia  -urine na <10 in 7/8, urine osmo low at 362  - improved following hydration and witholding lasix  -po lasix Resumed 7/12 to avoid vol overload  -now comfort measures, follow  CARCINOMA, LUNG, SQUAMOUS CELL  -tx'd 2006  CORONARY ARTERY DISEASE-nonobstructive disease 2006  catheterization  -no sx's  OSA (obstructive sleep apnea)  -supplemental o2, pulm following for further recs  Hypoalbuminemia  - albumin 1.6  -nutrition consulted 7/8  Constipation  -resolved with SMOG enema, follow  Hypokalemia  - k was replaced this hospital stay L. Sided neck swelling/mass  - [place] to further eval dc'ed as pt now full comfort care      Time: >30MINS  SignedKela Millin  [facility] Oct 26, 2012, 11:45 PM

## 2012-10-16 NOTE — Progress Notes (Signed)
RN called to bedside by pts. Family. Pt. Presenting with agonal breathing and HR down to 60. RN witnessed pt. Take last breath. No further respirations noted. Radial pulse assessed and absent. Golden Pop., RN in to pronounce time of death with RN. Pts. Apical HR assessed by both RNs and absent. TOD called at 0518. Pts. Family with him at bedside. On call NP, K. Schorr, notified. [facility] contacted. Pt. Placement and CMT notified. Post-mortem care and transport of pts. Body to morgue provided by NS and NT. Post-mortem checklist completed. Pts. Family did stop by the nurses station to thank the staff for the care that was provided to their family member during his stay on the unit. Marlette Curvin, Cheryll Dessert

## 2012-10-16 DEATH — deceased

## 2013-02-25 ENCOUNTER — Ambulatory Visit: Payer: Medicare Other | Admitting: Internal Medicine

## 2013-05-06 NOTE — Procedures (Signed)
°

## 2014-01-27 IMAGING — CT CT HEAD W/O CM
1 of 2 series · 16 of 30 positions shown, 20 images · non-contrast
Comparison: CT head without and with contrast 10/01/2004.

CLINICAL DATA: Stroke.  The patient developed right-sided numbness
and slurred speech last evening.  Remote history of lung cancer.

CT HEAD WITHOUT CONTRAST
TECHNIQUE: Contiguous axial images were obtained from the base of
the skull through the vertex without contrast.

[Series 2: head routine 4.8 h37s · axial · 0.47mm/px · z∈[-149,+6]mm · 16 of 36 slices shown, 20 images]
[im 2/36  brain]
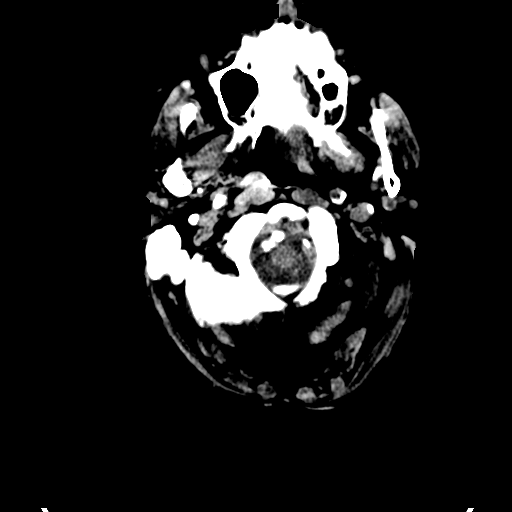
[im 2/36  bone]
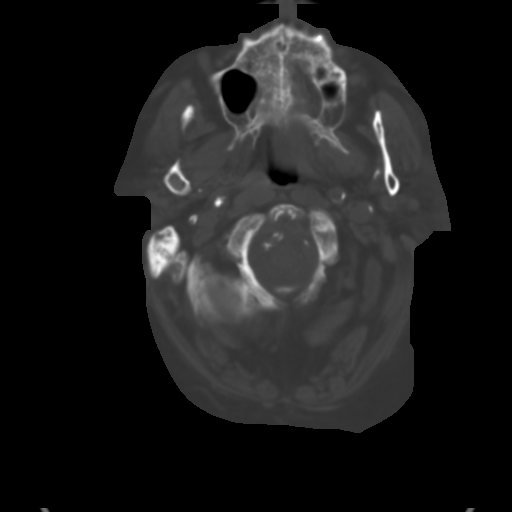
[im 4/36  brain]
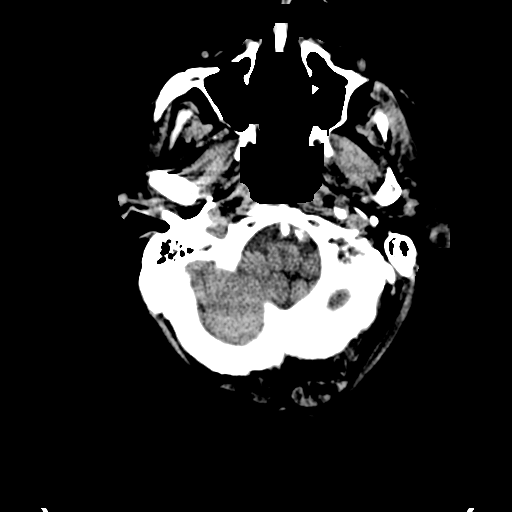
[im 7/36  brain]
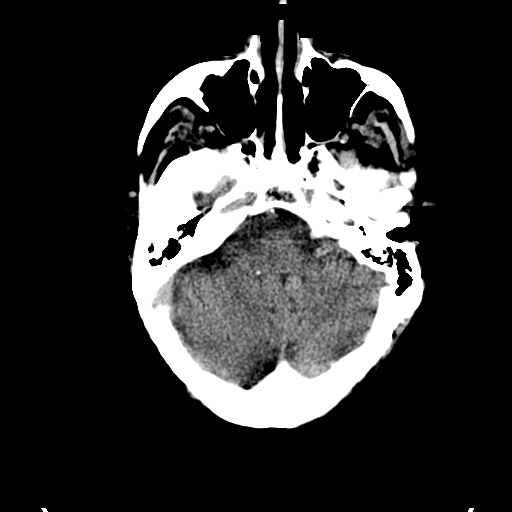
[im 9/36  brain]
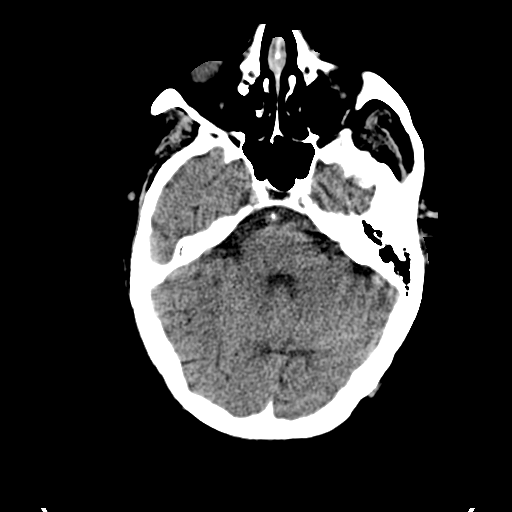
[im 11/36  brain]
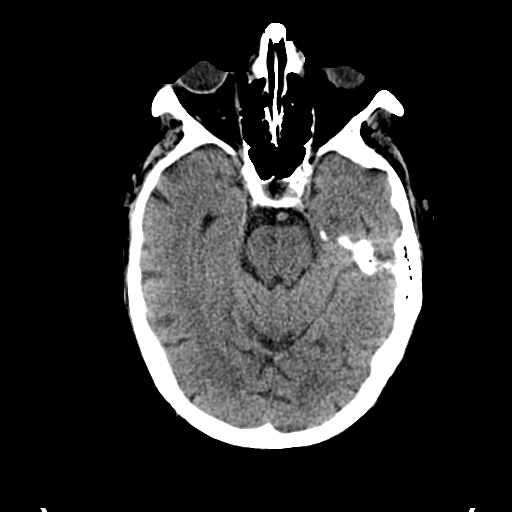
[im 11/36  bone]
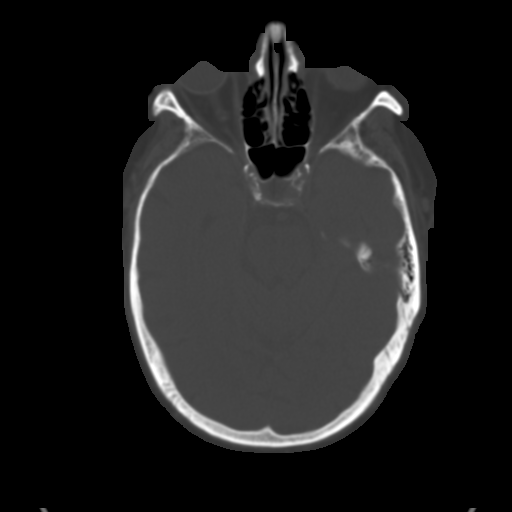
[im 12/36  brain]
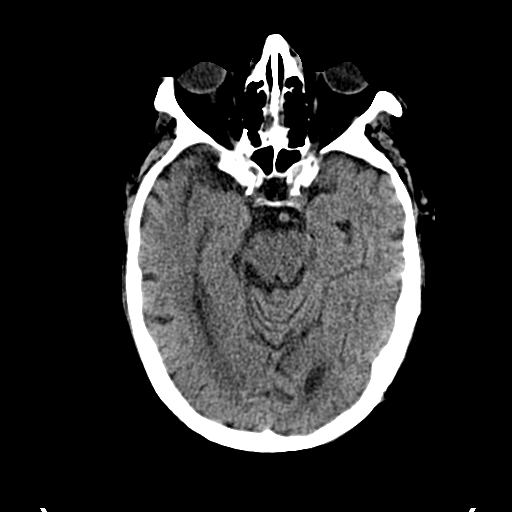
[im 16/36  brain]
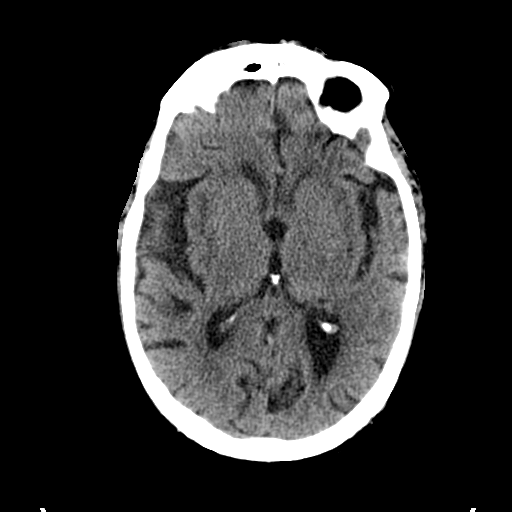
[im 17/36  brain]
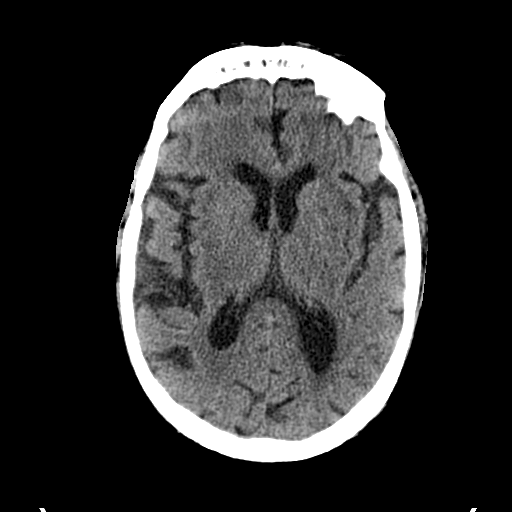
[im 19/36  brain]
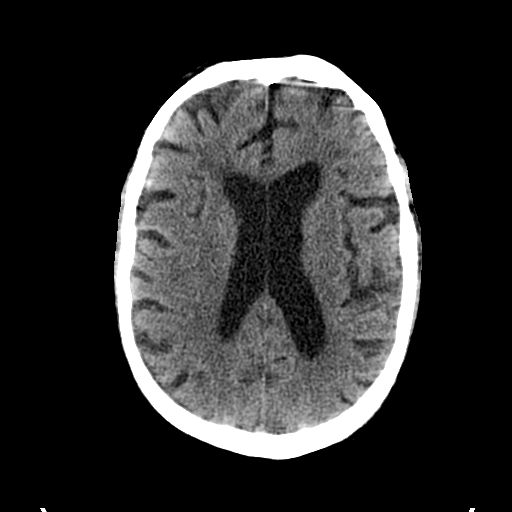
[im 19/36  bone]
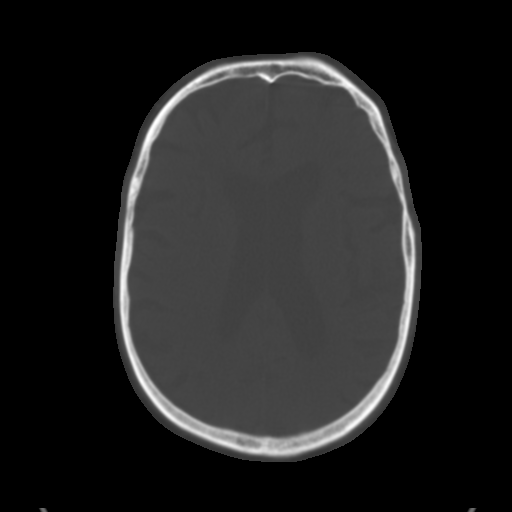
[im 21/36  brain]
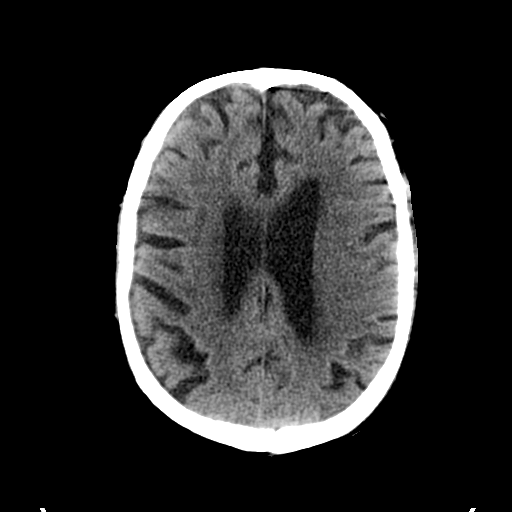
[im 24/36  brain]
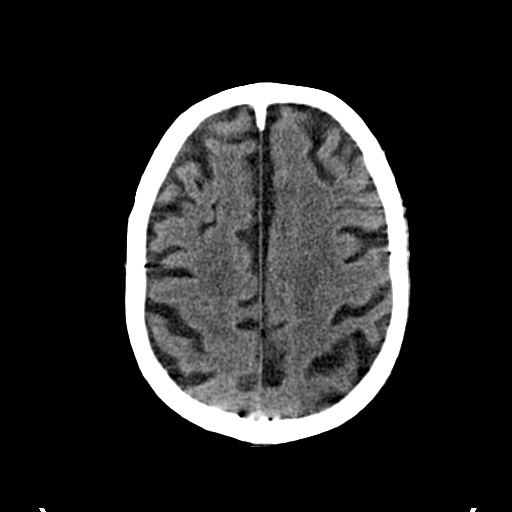
[im 26/36  brain]
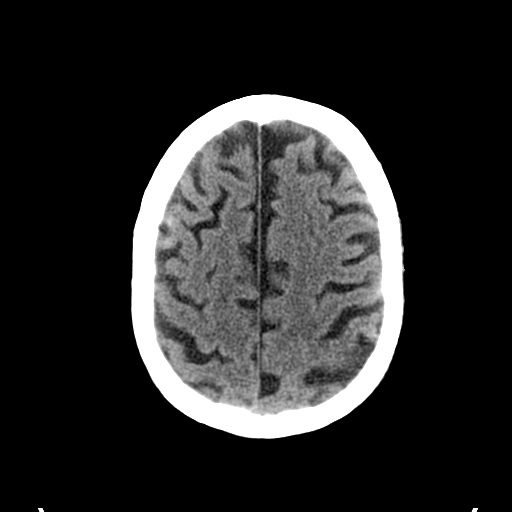
[im 27/36  brain]
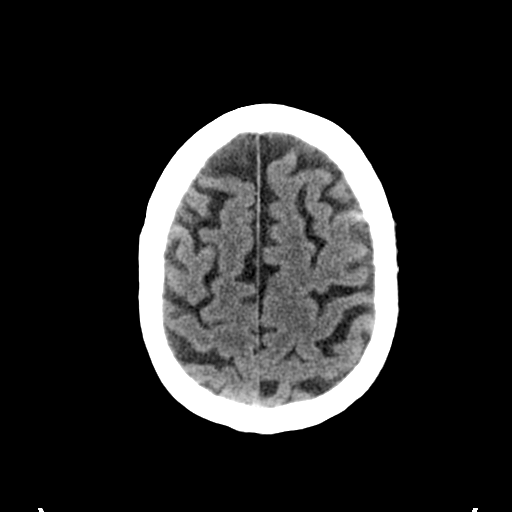
[im 27/36  bone]
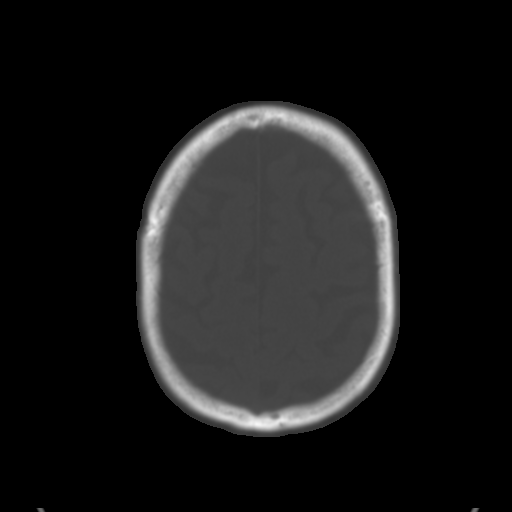
[im 29/36  brain]
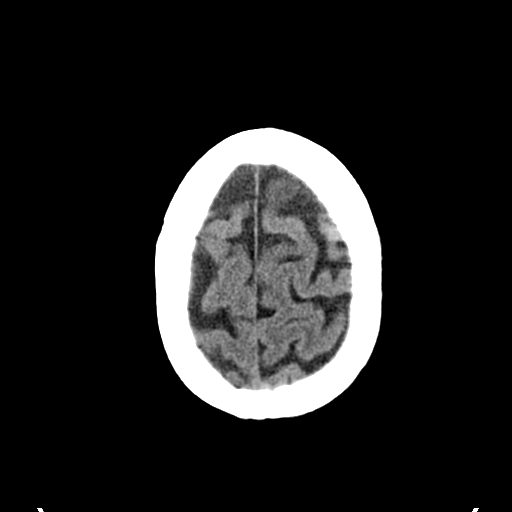
[im 32/36  brain]
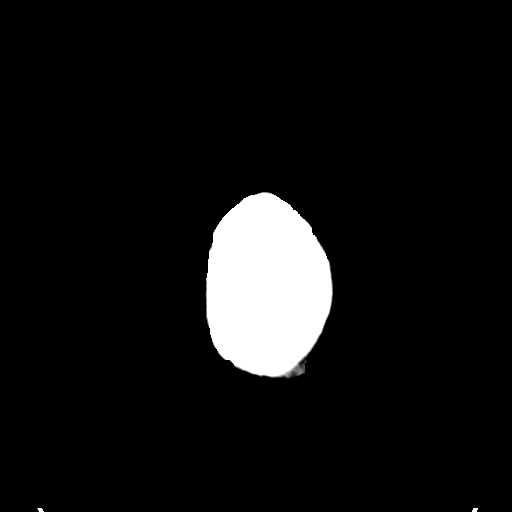
[im 34/36  brain]
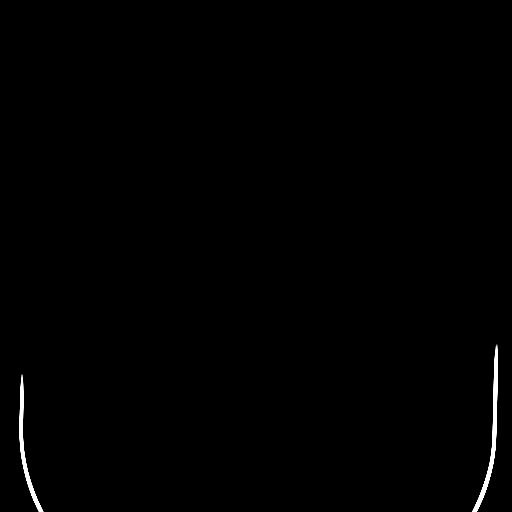

[16 of 30 positions shown; findings below may reference images not displayed]

FINDINGS: Mild generalized atrophy and white matter disease is
present. There is slight progression white matter disease since the
prior exam.  No acute cortical infarct, hemorrhage, or mass lesion
is present.  Ventricles are proportionate to the degree of atrophy.
No significant extra-axial fluid collection is present.

The paranasal sinuses and mastoid air cells are clear.  The osseous
skull is intact.
IMPRESSION: 1.  Slight progression of atrophy and white matter disease, likely
reflecting sequelae of chronic microvascular ischemia.
2.  No acute intracranial abnormality.

## 2014-01-30 IMAGING — CT CT HEAD W/O CM
2 series · 16 of 30 positions shown, 20 images · non-contrast
Comparison: CT head without contrast 09/09/2012

CLINICAL DATA: Evolving cerebral infarct. New onset atrial
fibrillation and TIA symptoms.

CT HEAD WITHOUT CONTRAST
TECHNIQUE: Contiguous axial images were obtained from the base of
the skull through the vertex without contrast.

[Series 3: head w/o · axial · non-contrast · 0.49mm/px · z∈[+90,+235]mm · 13 of 35 slices shown, 17 images]
[im 3/35  brain]
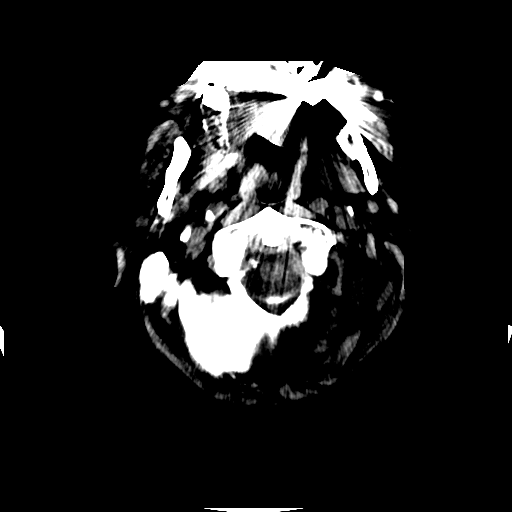
[im 3/35  bone]
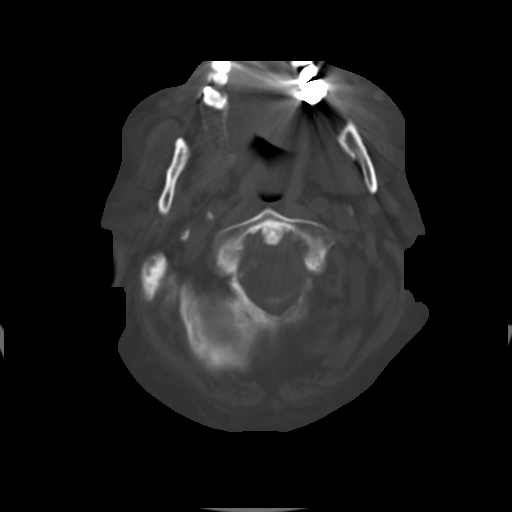
[im 5/35  brain]
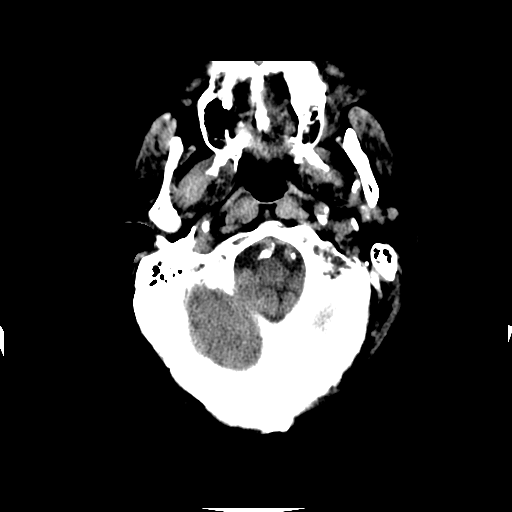
[im 8/35  brain]
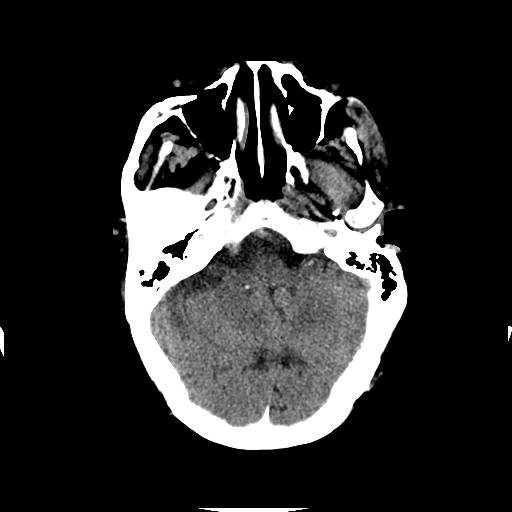
[im 10/35  brain]
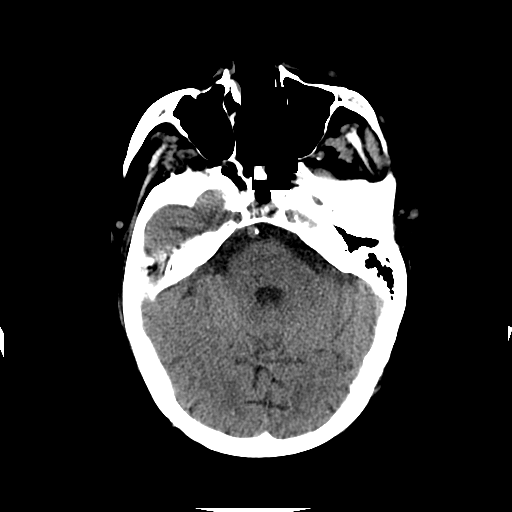
[im 13/35  brain]
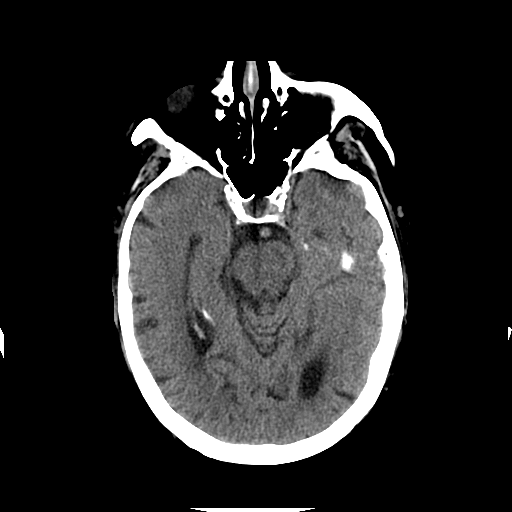
[im 13/35  bone]
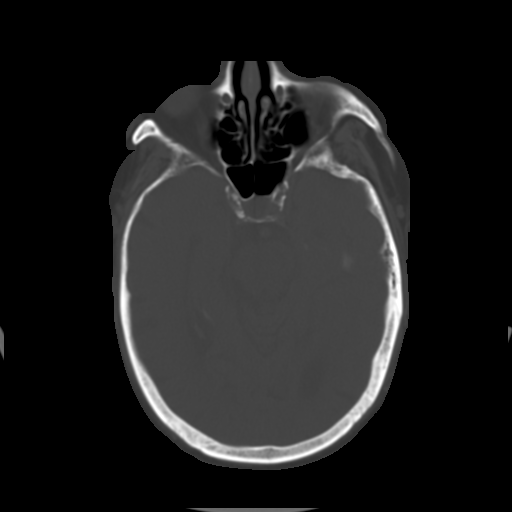
[im 15/35  brain]
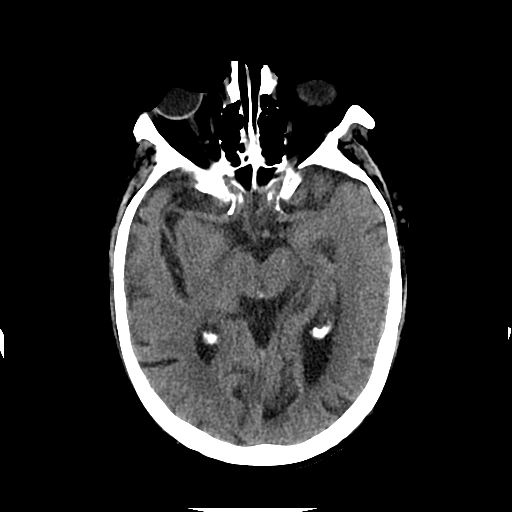
[im 18/35  brain]
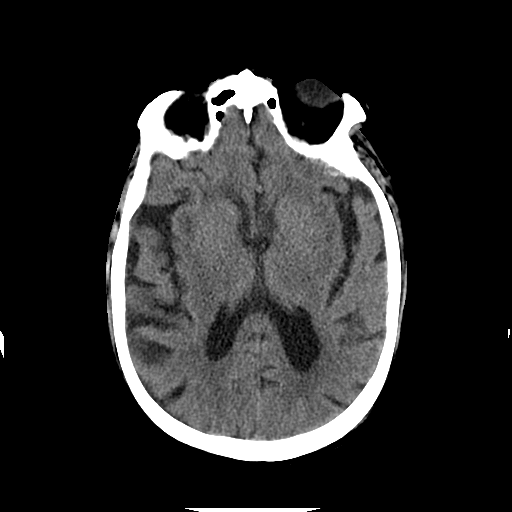
[im 20/35  brain]
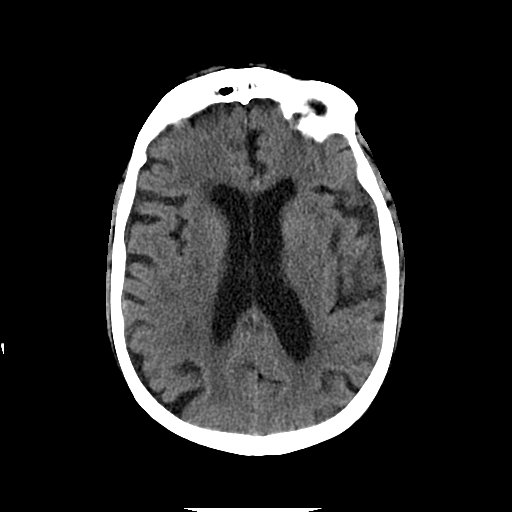
[im 22/35  brain]
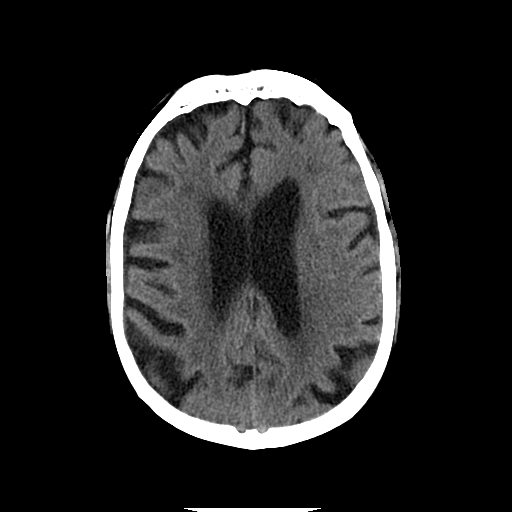
[im 22/35  bone]
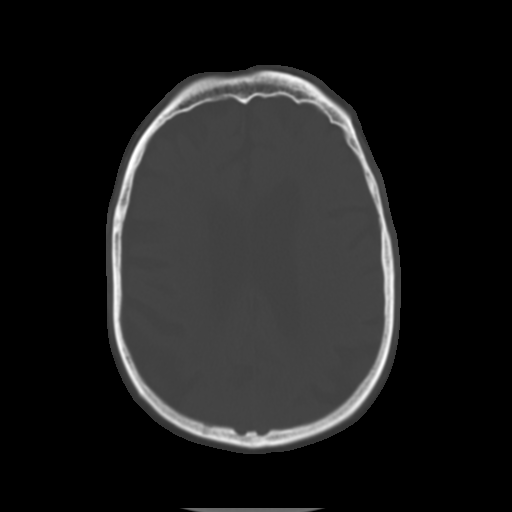
[im 25/35  brain]
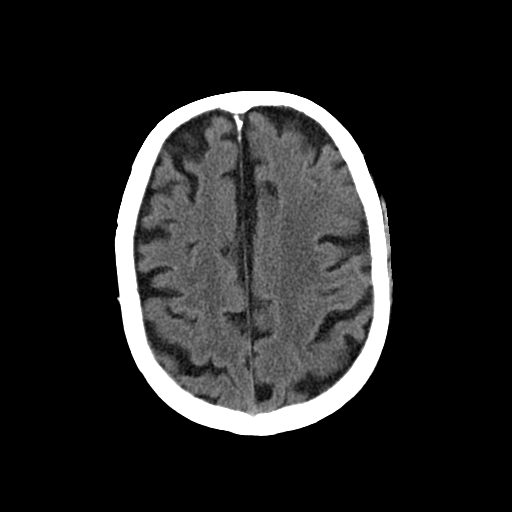
[im 27/35  brain]
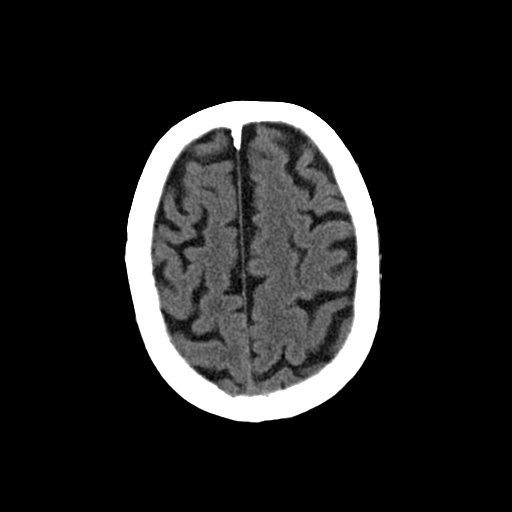
[im 30/35  brain]
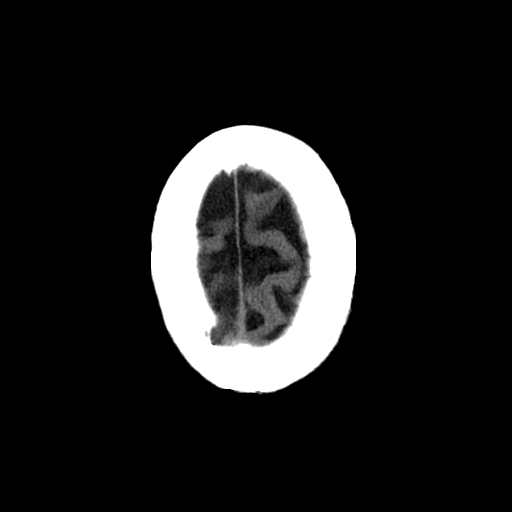
[im 32/35  brain]
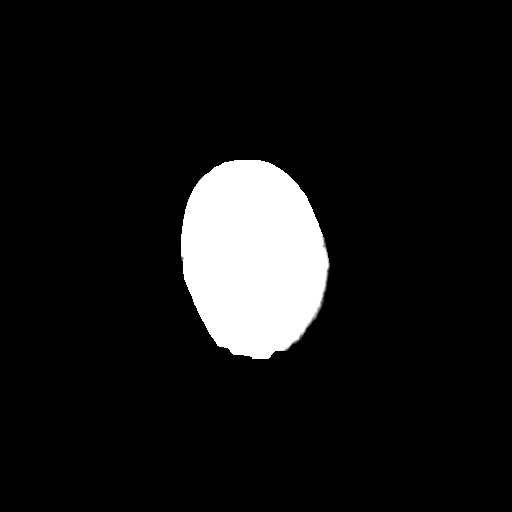
[im 32/35  bone]
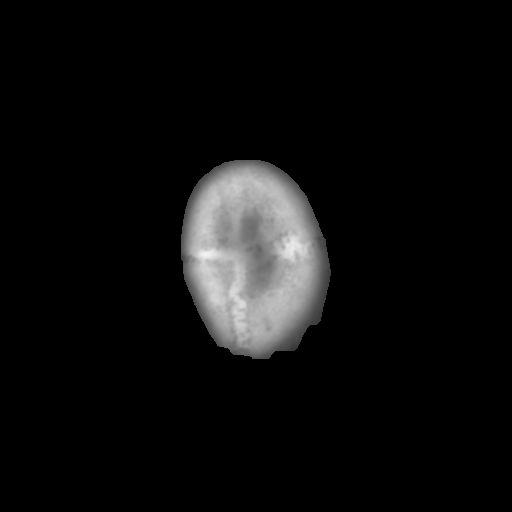

[Series 4: head w/o bone · axial · non-contrast · 0.49mm/px · z∈[+90,+140]mm · 3 of 35 slices shown]
[im 3/35  bone]
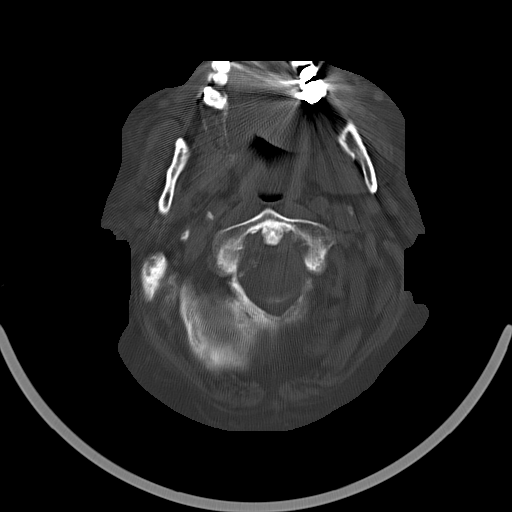
[im 8/35  bone]
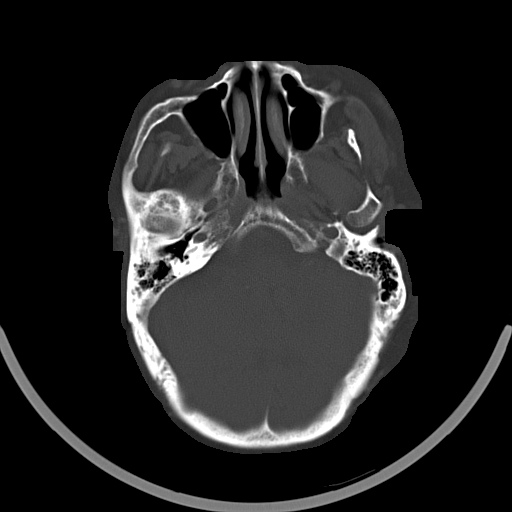
[im 13/35  bone]
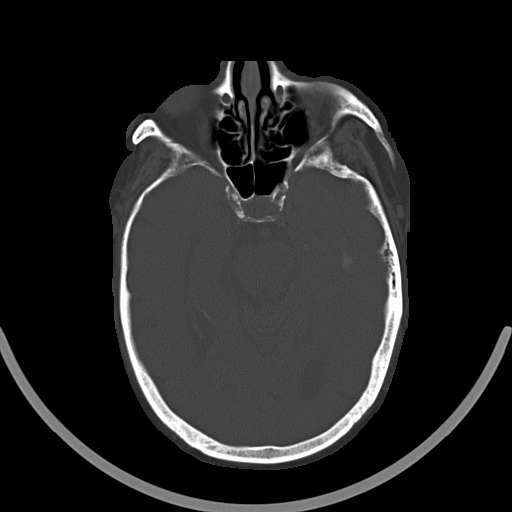

[16 of 30 positions shown; findings below may reference images not displayed]

FINDINGS: Mild generalized atrophy and white matter disease is
present.  The ventricles are proportionate to the degree of
atrophy.  No acute cortical infarct, hemorrhage, or mass lesion is
present.

The paranasal sinuses and mastoid air cells are clear.  The osseous
skull is intact.
IMPRESSION: 1.  No acute intracranial abnormality or significant interval
change.
2.  Mild atrophy white matter disease is not significantly changed
from the most recent examination.

## 2014-02-08 IMAGING — DX DG CHEST 1V PORT
1 series · 1 of 1 positions shown · non-contrast
Comparison: 09/14/2012

CLINICAL DATA: Respiratory distress.

PORTABLE CHEST - 1 VIEW

[portable]
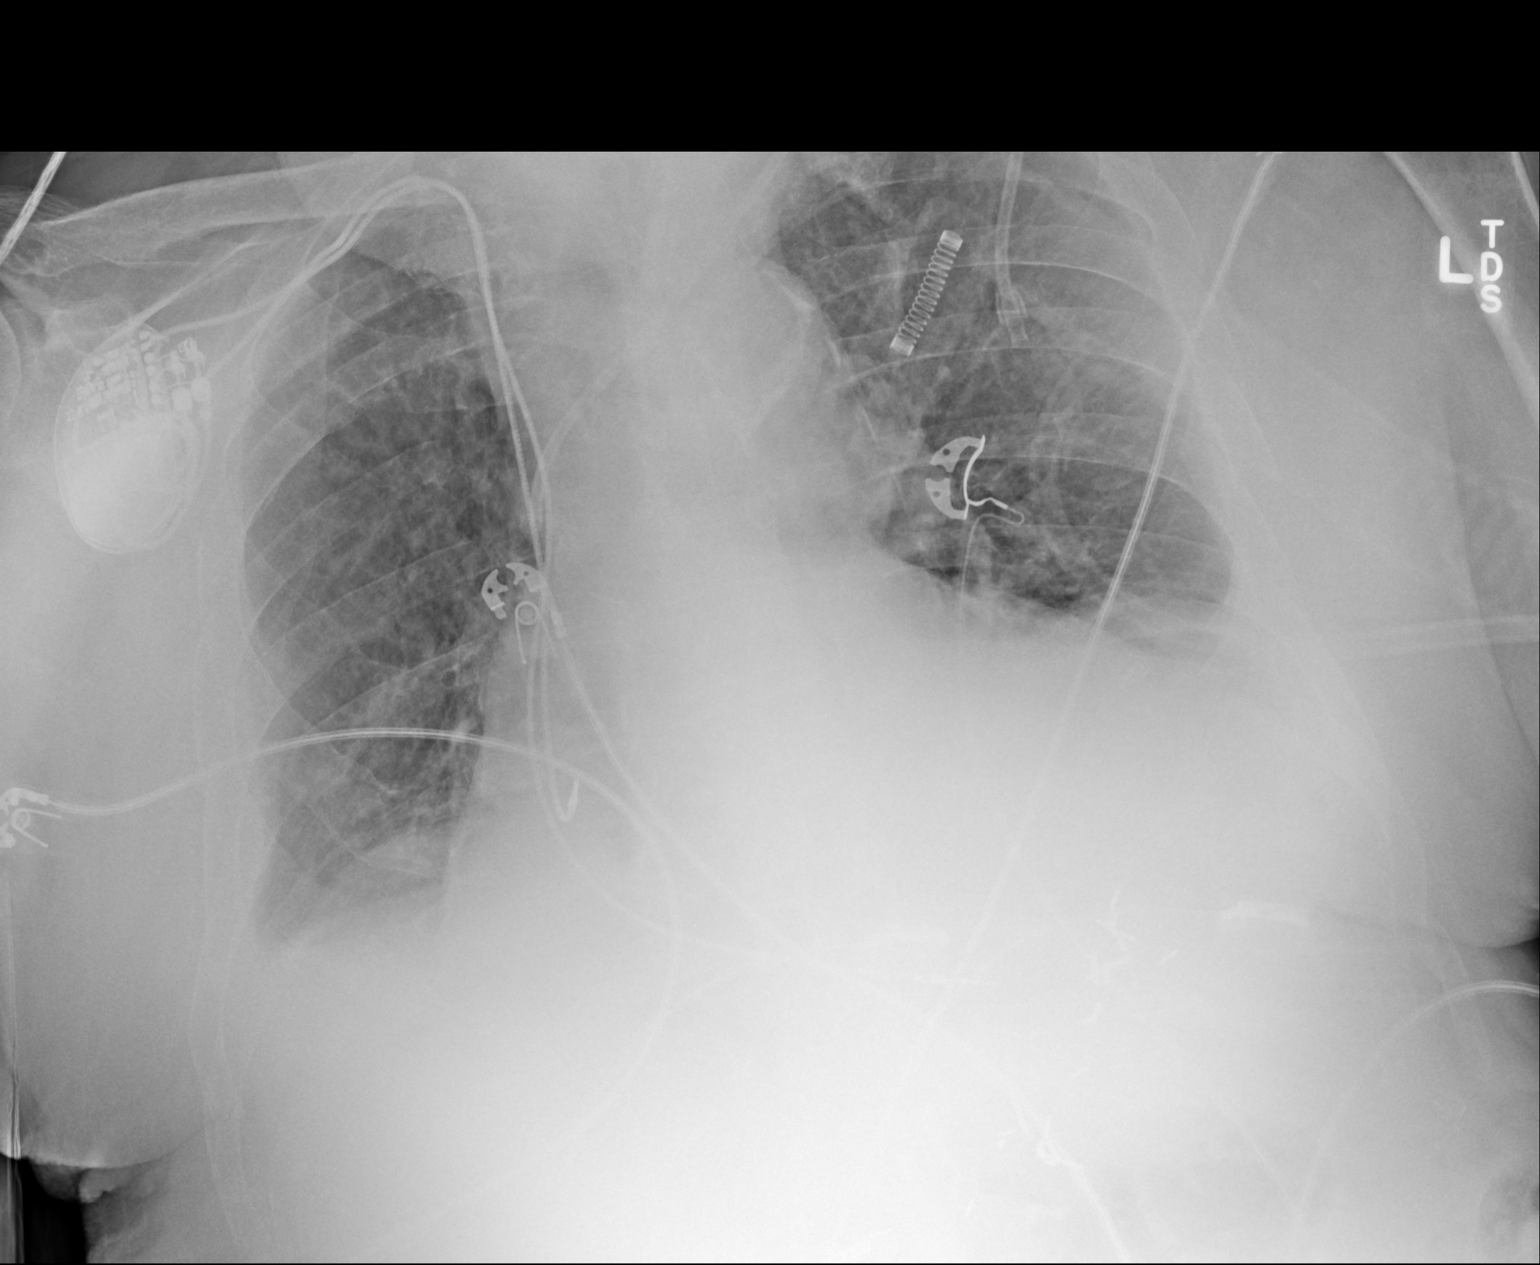

[1 of 1 positions shown; findings below may reference images not displayed]

FINDINGS: Dual lead pacemaker is in place.  Left subclavian central
line has its tip in the SVC.  Cardiac silhouette is enlarged.
There is pleural fluid bilaterally with some atelectasis in the
lower lungs.  Upper lungs are clear.
IMPRESSION: Persistent effusions and lower lobe atelectasis/infiltrate.
